# Patient Record
Sex: Male | Born: 1965 | Race: White | Hispanic: No | Marital: Single | Smoking: Current every day smoker
Health system: Southern US, Community
[De-identification: ages and names within clinical notes are randomized; demographics above are authoritative.]

## PROBLEM LIST (undated history)

## (undated) DIAGNOSIS — F1020 Alcohol dependence, uncomplicated: Secondary | ICD-10-CM

## (undated) DIAGNOSIS — I10 Essential (primary) hypertension: Secondary | ICD-10-CM

## (undated) DIAGNOSIS — J449 Chronic obstructive pulmonary disease, unspecified: Secondary | ICD-10-CM

## (undated) DIAGNOSIS — H5440 Blindness, one eye, unspecified eye: Secondary | ICD-10-CM

## (undated) DIAGNOSIS — I639 Cerebral infarction, unspecified: Secondary | ICD-10-CM

## (undated) HISTORY — PX: ANTERIOR CRUCIATE LIGAMENT REPAIR: SHX115

---

## 2000-06-15 ENCOUNTER — Ambulatory Visit (HOSPITAL_BASED_OUTPATIENT_CLINIC_OR_DEPARTMENT_OTHER): Admission: RE | Admit: 2000-06-15 | Discharge: 2000-06-15 | Payer: Self-pay | Admitting: Orthopedic Surgery

## 2000-06-23 ENCOUNTER — Ambulatory Visit (HOSPITAL_COMMUNITY): Admission: RE | Admit: 2000-06-23 | Discharge: 2000-06-23 | Payer: Self-pay | Admitting: Orthopedic Surgery

## 2000-07-08 ENCOUNTER — Encounter: Admission: RE | Admit: 2000-07-08 | Discharge: 2000-08-27 | Payer: Self-pay | Admitting: Orthopedic Surgery

## 2001-06-21 ENCOUNTER — Emergency Department (HOSPITAL_COMMUNITY): Admission: EM | Admit: 2001-06-21 | Discharge: 2001-06-21 | Payer: Self-pay | Admitting: Emergency Medicine

## 2001-08-21 ENCOUNTER — Emergency Department (HOSPITAL_COMMUNITY): Admission: EM | Admit: 2001-08-21 | Discharge: 2001-08-21 | Payer: Self-pay | Admitting: Emergency Medicine

## 2001-08-22 ENCOUNTER — Emergency Department (HOSPITAL_COMMUNITY): Admission: EM | Admit: 2001-08-22 | Discharge: 2001-08-22 | Payer: Self-pay | Admitting: Emergency Medicine

## 2001-08-23 ENCOUNTER — Emergency Department (HOSPITAL_COMMUNITY): Admission: EM | Admit: 2001-08-23 | Discharge: 2001-08-23 | Payer: Self-pay | Admitting: Emergency Medicine

## 2001-08-25 ENCOUNTER — Encounter: Admission: RE | Admit: 2001-08-25 | Discharge: 2001-08-25 | Payer: Self-pay | Admitting: Internal Medicine

## 2001-09-01 ENCOUNTER — Encounter: Admission: RE | Admit: 2001-09-01 | Discharge: 2001-09-01 | Payer: Self-pay | Admitting: Internal Medicine

## 2001-09-08 ENCOUNTER — Encounter: Admission: RE | Admit: 2001-09-08 | Discharge: 2001-09-08 | Payer: Self-pay | Admitting: Internal Medicine

## 2005-05-23 ENCOUNTER — Inpatient Hospital Stay (HOSPITAL_COMMUNITY): Admission: EM | Admit: 2005-05-23 | Discharge: 2005-05-26 | Payer: Self-pay | Admitting: Emergency Medicine

## 2005-05-23 ENCOUNTER — Ambulatory Visit: Payer: Self-pay | Admitting: Cardiology

## 2005-08-05 ENCOUNTER — Emergency Department (HOSPITAL_COMMUNITY): Admission: EM | Admit: 2005-08-05 | Discharge: 2005-08-05 | Payer: Self-pay | Admitting: Family Medicine

## 2017-11-22 ENCOUNTER — Encounter (HOSPITAL_COMMUNITY): Payer: Self-pay

## 2017-11-22 ENCOUNTER — Emergency Department (HOSPITAL_COMMUNITY)
Admission: EM | Admit: 2017-11-22 | Discharge: 2017-11-23 | Disposition: A | Discharge status: Home / Self Care | Payer: Self-pay | Attending: Emergency Medicine | Admitting: Emergency Medicine

## 2017-11-22 ENCOUNTER — Emergency Department (HOSPITAL_COMMUNITY): Payer: Self-pay

## 2017-11-22 ENCOUNTER — Other Ambulatory Visit: Payer: Self-pay

## 2017-11-22 DIAGNOSIS — Z23 Encounter for immunization: Secondary | ICD-10-CM | POA: Insufficient documentation

## 2017-11-22 DIAGNOSIS — Y998 Other external cause status: Secondary | ICD-10-CM | POA: Insufficient documentation

## 2017-11-22 DIAGNOSIS — F10920 Alcohol use, unspecified with intoxication, uncomplicated: Secondary | ICD-10-CM

## 2017-11-22 DIAGNOSIS — S0101XA Laceration without foreign body of scalp, initial encounter: Secondary | ICD-10-CM | POA: Insufficient documentation

## 2017-11-22 DIAGNOSIS — Z8673 Personal history of transient ischemic attack (TIA), and cerebral infarction without residual deficits: Secondary | ICD-10-CM | POA: Insufficient documentation

## 2017-11-22 DIAGNOSIS — Y939 Activity, unspecified: Secondary | ICD-10-CM | POA: Insufficient documentation

## 2017-11-22 DIAGNOSIS — F10120 Alcohol abuse with intoxication, uncomplicated: Secondary | ICD-10-CM | POA: Insufficient documentation

## 2017-11-22 DIAGNOSIS — S0990XA Unspecified injury of head, initial encounter: Secondary | ICD-10-CM

## 2017-11-22 DIAGNOSIS — Y33XXXA Other specified events, undetermined intent, initial encounter: Secondary | ICD-10-CM | POA: Insufficient documentation

## 2017-11-22 DIAGNOSIS — Y9289 Other specified places as the place of occurrence of the external cause: Secondary | ICD-10-CM | POA: Insufficient documentation

## 2017-11-22 DIAGNOSIS — F17200 Nicotine dependence, unspecified, uncomplicated: Secondary | ICD-10-CM | POA: Insufficient documentation

## 2017-11-22 HISTORY — DX: Cerebral infarction, unspecified: I63.9

## 2017-11-22 HISTORY — DX: Blindness, one eye, unspecified eye: H54.40

## 2017-11-22 HISTORY — DX: Alcohol dependence, uncomplicated: F10.20

## 2017-11-22 MED ORDER — TETANUS-DIPHTH-ACELL PERTUSSIS 5-2.5-18.5 LF-MCG/0.5 IM SUSP
0.5000 mL | Freq: Once | INTRAMUSCULAR | Status: AC
  Administered 2017-11-23: 0.5 mL via INTRAMUSCULAR
  Filled 2017-11-22: qty 0.5

## 2017-11-22 NOTE — ED Provider Notes (Signed)
[facility] Provider Note   CSN: [id number] Arrival date & time: 11/22/17  2244     History   Chief Complaint Chief Complaint  Patient presents with  . Fall  . Alcohol Intoxication    HPI Robert SpringMichael S Frank is a 52 y.o. male.  Patient presents to the emergency department with chief complaint of fall and intoxication.  He was reportedly found down outside of the [employer].  He has alcohol on board.  He has a laceration to the back of his head.  He cannot remember what happened.  He denies being in any pain.  He denies any history of seizure. Level 5 caveat applies 2/2 to intoxication.  The history is provided by the patient. No language interpreter was used.    Past Medical History:  Diagnosis Date  . Alcoholism (HCC)   . Blind left eye   . CVA (cerebral vascular accident) (HCC)    12 years ago- residual R sided numbness    There are no active problems to display for this patient.   Past Surgical History:  Procedure Laterality Date  . ANTERIOR CRUCIATE LIGAMENT REPAIR          Home Medications    Prior to Admission medications   Not on File    Family History History reviewed. No pertinent family history.  Social History Social History   Tobacco Use  . Smoking status: Current Every Day Smoker    Packs/day: 1.00  . Smokeless tobacco: Never Used  Substance Use Topics  . Alcohol use: Yes    Comment: 5 40 oz/day  . Drug use: Not Currently     Allergies   Patient has no known allergies.   Review of Systems Review of Systems  All other systems reviewed and are negative.    Physical Exam Updated Vital Signs BP (!) 153/104   Pulse 99   Temp 98.5 F (36.9 C)   Resp (!) 22   Ht 5\' 5"  (1.651 m)   Wt 86.2 kg (190 lb)   SpO2 96%   BMI 31.62 kg/m   Physical Exam  Constitutional: He is oriented to person, place, and time. He appears well-developed and well-nourished.  HENT:  Head: Normocephalic and  atraumatic.  3 cm linear scalp laceration  Eyes: Pupils are equal, round, and reactive to light. Conjunctivae and EOM are normal. Right eye exhibits no discharge. Left eye exhibits no discharge. No scleral icterus.  Left pupil irregular, blind in left eye at baseline   Neck: Normal range of motion. Neck supple. No JVD present.  Cardiovascular: Normal rate, regular rhythm and normal heart sounds. Exam reveals no gallop and no friction rub.  No murmur heard. Pulmonary/Chest: Effort normal. No respiratory distress. He has wheezes. He has no rales. He exhibits no tenderness.  Abdominal: Soft. He exhibits no distension and no mass. There is no tenderness. There is no rebound and no guarding.  Musculoskeletal: Normal range of motion. He exhibits no edema or tenderness.  Neurological: He is alert and oriented to person, place, and time.  intoxicated  Skin: Skin is warm and dry.  Psychiatric: He has a normal mood and affect. His behavior is normal. Judgment and thought content normal.  Nursing note and vitals reviewed.    ED Treatments / Results  Labs (all labs ordered are listed, but only abnormal results are displayed) Labs Reviewed  BASIC METABOLIC PANEL  ETHANOL  CBC  CBG MONITORING, ED    EKG  None  Radiology No results found.  Procedures Procedures (including critical care time)  Medications Ordered in ED Medications - No data to display   Initial Impression / Assessment and Plan / ED Course  I have reviewed the triage vital signs and the nursing notes.  Pertinent labs & imaging results that were available during my care of the patient were reviewed by me and considered in my medical decision making (see chart for details).     Patient is clinically intoxicated.  He has a laceration to his scalp.  Will check CT of his head and cervical spine.  Initial labs show hypokalemia of 2.7 and hypocalcemia of 4.9.  Will replace K and check albumin.  2:16 AM Corrected calcium  is 6.2-7.4.  Discussed this with Dr. Preston Fleeting, who recommends no further intervention given that patient is asymptomatic.  5:27 AM Patient is clinically sober, he is eating, drinking, and able to walk to the bathroom without any difficulties.  He is adamantly refusing repair of his scalp laceration.  He has capacity to refuse this.  Tetanus shot was updated.  Final Clinical Impressions(s) / ED Diagnoses   Final diagnoses:  Alcoholic intoxication without complication (HCC)  Injury of head, initial encounter  Laceration of scalp, initial encounter    ED Discharge Orders    None       Roxy Horseman, PA-C 11/23/17 0528    Dione Booze, MD 11/23/17 6213    Dione Booze, MD 11/23/17 2234

## 2017-11-22 NOTE — ED Notes (Signed)
Returned from xray

## 2017-11-22 NOTE — ED Triage Notes (Signed)
Pt BIB [facility] for eval of fall, ETOH intox. Pt was [place] [employer], fell backwards, struck his head. Pt does not recall incident, has been drinking beer all day today. Pt denies pain. In c-collar by EMS, but pt non compliant w/ c-collar, continually removing it and slipping chin down. Pt w/ 1 inch lac to back of head, no other obvious trauma on arrival.

## 2017-11-22 NOTE — ED Notes (Signed)
Pt taken to xray 

## 2017-11-23 LAB — HEPATIC FUNCTION PANEL
ALT: 28 U/L (ref 17–63)
AST: 40 U/L (ref 15–41)
Albumin: 1.9 g/dL — ABNORMAL LOW (ref 3.5–5.0)
Alkaline Phosphatase: 39 U/L (ref 38–126)
Bilirubin, Direct: 0.2 mg/dL (ref 0.1–0.5)
Indirect Bilirubin: 0.1 mg/dL — ABNORMAL LOW (ref 0.3–0.9)
Total Bilirubin: 0.3 mg/dL (ref 0.3–1.2)
Total Protein: 4.1 g/dL — ABNORMAL LOW (ref 6.5–8.1)

## 2017-11-23 LAB — CBC
HCT: 31.8 % — ABNORMAL LOW (ref 39.0–52.0)
Hemoglobin: 10.3 g/dL — ABNORMAL LOW (ref 13.0–17.0)
MCH: 36.8 pg — ABNORMAL HIGH (ref 26.0–34.0)
MCHC: 32.4 g/dL (ref 30.0–36.0)
MCV: 113.6 fL — ABNORMAL HIGH (ref 78.0–100.0)
Platelets: 128 10*3/uL — ABNORMAL LOW (ref 150–400)
RBC: 2.8 MIL/uL — ABNORMAL LOW (ref 4.22–5.81)
RDW: 12.8 % (ref 11.5–15.5)
WBC: 4.5 10*3/uL (ref 4.0–10.5)

## 2017-11-23 LAB — BASIC METABOLIC PANEL
Anion gap: 7 (ref 5–15)
BUN: 5 mg/dL — ABNORMAL LOW (ref 6–20)
CO2: 15 mmol/L — ABNORMAL LOW (ref 22–32)
Calcium: 4.9 mg/dL — CL (ref 8.9–10.3)
Chloride: 118 mmol/L — ABNORMAL HIGH (ref 101–111)
Creatinine, Ser: 0.42 mg/dL — ABNORMAL LOW (ref 0.61–1.24)
GFR calc Af Amer: >60 mL/min (ref >60)
GFR calc non Af Amer: >60 mL/min (ref >60)
Glucose, Bld: 70 mg/dL (ref 65–99)
Potassium: 2.7 mmol/L — CL (ref 3.5–5.1)
Sodium: 140 mmol/L (ref 135–145)

## 2017-11-23 LAB — MAGNESIUM: Magnesium: 1.2 mg/dL — ABNORMAL LOW (ref 1.7–2.4)

## 2017-11-23 LAB — ETHANOL: Alcohol, Ethyl (B): 178 mg/dL — ABNORMAL HIGH (ref <10)

## 2017-11-23 MED ORDER — POTASSIUM CHLORIDE CRYS ER 20 MEQ PO TBCR: 20.0000 meq | EXTENDED_RELEASE_TABLET | Freq: Two times a day (BID) | ORAL | 0 refills | Status: DC

## 2017-11-23 MED ORDER — POTASSIUM CHLORIDE CRYS ER 20 MEQ PO TBCR
40.0000 meq | EXTENDED_RELEASE_TABLET | Freq: Once | ORAL | Status: AC
  Administered 2017-11-23: 40 meq via ORAL
  Filled 2017-11-23: qty 2

## 2018-10-06 ENCOUNTER — Observation Stay (HOSPITAL_COMMUNITY)
Admission: EM | Admit: 2018-10-06 | Discharge: 2018-10-07 | Disposition: A | Discharge status: Home Health | Payer: Self-pay | Attending: Internal Medicine | Admitting: Internal Medicine

## 2018-10-06 ENCOUNTER — Emergency Department (HOSPITAL_COMMUNITY): Payer: Self-pay

## 2018-10-06 ENCOUNTER — Encounter (HOSPITAL_COMMUNITY): Payer: Self-pay

## 2018-10-06 ENCOUNTER — Other Ambulatory Visit: Payer: Self-pay

## 2018-10-06 DIAGNOSIS — R0600 Dyspnea, unspecified: Secondary | ICD-10-CM

## 2018-10-06 DIAGNOSIS — I10 Essential (primary) hypertension: Secondary | ICD-10-CM | POA: Insufficient documentation

## 2018-10-06 DIAGNOSIS — H5462 Unqualified visual loss, left eye, normal vision right eye: Secondary | ICD-10-CM | POA: Insufficient documentation

## 2018-10-06 DIAGNOSIS — D7589 Other specified diseases of blood and blood-forming organs: Secondary | ICD-10-CM | POA: Insufficient documentation

## 2018-10-06 DIAGNOSIS — J449 Chronic obstructive pulmonary disease, unspecified: Secondary | ICD-10-CM | POA: Diagnosis present

## 2018-10-06 DIAGNOSIS — F17210 Nicotine dependence, cigarettes, uncomplicated: Secondary | ICD-10-CM

## 2018-10-06 DIAGNOSIS — Z8673 Personal history of transient ischemic attack (TIA), and cerebral infarction without residual deficits: Secondary | ICD-10-CM | POA: Insufficient documentation

## 2018-10-06 DIAGNOSIS — J441 Chronic obstructive pulmonary disease with (acute) exacerbation: Principal | ICD-10-CM | POA: Diagnosis present

## 2018-10-06 DIAGNOSIS — Z20828 Contact with and (suspected) exposure to other viral communicable diseases: Secondary | ICD-10-CM | POA: Insufficient documentation

## 2018-10-06 DIAGNOSIS — R238 Other skin changes: Secondary | ICD-10-CM

## 2018-10-06 DIAGNOSIS — I872 Venous insufficiency (chronic) (peripheral): Secondary | ICD-10-CM

## 2018-10-06 HISTORY — DX: Chronic obstructive pulmonary disease with (acute) exacerbation: J44.1

## 2018-10-06 LAB — CBC WITH DIFFERENTIAL/PLATELET
Abs Immature Granulocytes: 0.04 10*3/uL (ref 0.00–0.07)
Basophils Absolute: 0.1 10*3/uL (ref 0.0–0.1)
Basophils Relative: 1 %
Eosinophils Absolute: 0.6 10*3/uL — ABNORMAL HIGH (ref 0.0–0.5)
Eosinophils Relative: 7 %
HCT: 48.5 % (ref 39.0–52.0)
Hemoglobin: 16.2 g/dL (ref 13.0–17.0)
Immature Granulocytes: 1 %
Lymphocytes Relative: 15 %
Lymphs Abs: 1.3 10*3/uL (ref 0.7–4.0)
MCH: 36.8 pg — ABNORMAL HIGH (ref 26.0–34.0)
MCHC: 33.4 g/dL (ref 30.0–36.0)
MCV: 110.2 fL — ABNORMAL HIGH (ref 80.0–100.0)
Monocytes Absolute: 1.2 10*3/uL — ABNORMAL HIGH (ref 0.1–1.0)
Monocytes Relative: 14 %
Neutro Abs: 5.3 10*3/uL (ref 1.7–7.7)
Neutrophils Relative %: 62 %
Platelets: 243 10*3/uL (ref 150–400)
RBC: 4.4 MIL/uL (ref 4.22–5.81)
RDW: 12.3 % (ref 11.5–15.5)
WBC: 8.5 10*3/uL (ref 4.0–10.5)
nRBC: 0 % (ref 0.0–0.2)

## 2018-10-06 LAB — COMPREHENSIVE METABOLIC PANEL
ALT: 28 U/L (ref 0–44)
AST: 32 U/L (ref 15–41)
Albumin: 3.5 g/dL (ref 3.5–5.0)
Alkaline Phosphatase: 85 U/L (ref 38–126)
Anion gap: 11 (ref 5–15)
BUN: 9 mg/dL (ref 6–20)
CO2: 31 mmol/L (ref 22–32)
Calcium: 9.1 mg/dL (ref 8.9–10.3)
Chloride: 95 mmol/L — ABNORMAL LOW (ref 98–111)
Creatinine, Ser: 0.79 mg/dL (ref 0.61–1.24)
GFR calc Af Amer: >60 mL/min (ref >60)
GFR calc non Af Amer: >60 mL/min (ref >60)
Glucose, Bld: 124 mg/dL — ABNORMAL HIGH (ref 70–99)
Potassium: 4 mmol/L (ref 3.5–5.1)
Sodium: 137 mmol/L (ref 135–145)
Total Bilirubin: 0.7 mg/dL (ref 0.3–1.2)
Total Protein: 7.6 g/dL (ref 6.5–8.1)

## 2018-10-06 LAB — LACTATE DEHYDROGENASE: LDH: 152 U/L (ref 98–192)

## 2018-10-06 LAB — LACTIC ACID, PLASMA: Lactic Acid, Venous: 1.3 mmol/L (ref 0.5–1.9)

## 2018-10-06 LAB — D-DIMER, QUANTITATIVE: D-Dimer, Quant: 1.59 ug/mL-FEU — ABNORMAL HIGH (ref 0.00–0.50)

## 2018-10-06 LAB — PROCALCITONIN: Procalcitonin: <0.1 ng/mL

## 2018-10-06 LAB — C-REACTIVE PROTEIN: CRP: <0.8 mg/dL (ref <1.0)

## 2018-10-06 LAB — BRAIN NATRIURETIC PEPTIDE: B Natriuretic Peptide: 155.8 pg/mL — ABNORMAL HIGH (ref 0.0–100.0)

## 2018-10-06 LAB — FERRITIN: Ferritin: 536 ng/mL — ABNORMAL HIGH (ref 24–336)

## 2018-10-06 LAB — TRIGLYCERIDES: Triglycerides: 40 mg/dL (ref <150)

## 2018-10-06 LAB — VITAMIN B12: Vitamin B-12: 165 pg/mL — ABNORMAL LOW (ref 180–914)

## 2018-10-06 LAB — FIBRINOGEN: Fibrinogen: 429 mg/dL (ref 210–475)

## 2018-10-06 LAB — SARS CORONAVIRUS 2 BY RT PCR (HOSPITAL ORDER, PERFORMED IN ~~LOC~~ HOSPITAL LAB): SARS Coronavirus 2: NEGATIVE

## 2018-10-06 MED ORDER — ALBUTEROL SULFATE HFA 108 (90 BASE) MCG/ACT IN AERS
8.0000 | INHALATION_SPRAY | Freq: Once | RESPIRATORY_TRACT | Status: AC
  Administered 2018-10-06: 06:00:00 8 via RESPIRATORY_TRACT

## 2018-10-06 MED ORDER — FOLIC ACID 1 MG PO TABS
1.0000 mg | ORAL_TABLET | Freq: Every day | ORAL | Status: DC
  Administered 2018-10-07: 1 mg via ORAL
  Filled 2018-10-06: qty 1

## 2018-10-06 MED ORDER — LORAZEPAM 2 MG/ML IJ SOLN: 2.0000 mg | INTRAMUSCULAR | Status: DC | PRN

## 2018-10-06 MED ORDER — SODIUM CHLORIDE 0.9 % IV SOLN
500.0000 mg | INTRAVENOUS | Status: AC
  Administered 2018-10-06: 08:00:00 500 mg via INTRAVENOUS
  Filled 2018-10-06: qty 500

## 2018-10-06 MED ORDER — VITAMIN B-1 100 MG PO TABS
100.0000 mg | ORAL_TABLET | Freq: Every day | ORAL | Status: DC
  Administered 2018-10-07: 100 mg via ORAL
  Filled 2018-10-06: qty 1

## 2018-10-06 MED ORDER — THIAMINE HCL 100 MG/ML IJ SOLN
100.0000 mg | Freq: Every day | INTRAMUSCULAR | Status: DC
  Administered 2018-10-06: 100 mg via INTRAVENOUS
  Filled 2018-10-06: qty 2

## 2018-10-06 MED ORDER — ALBUTEROL SULFATE HFA 108 (90 BASE) MCG/ACT IN AERS
2.0000 | INHALATION_SPRAY | Freq: Once | RESPIRATORY_TRACT | Status: AC
  Administered 2018-10-06: 2 via RESPIRATORY_TRACT
  Filled 2018-10-06: qty 6.7

## 2018-10-06 MED ORDER — AEROCHAMBER PLUS FLO-VU LARGE MISC
1.0000 | Freq: Once | Status: AC
  Administered 2018-10-06: 1

## 2018-10-06 MED ORDER — IPRATROPIUM-ALBUTEROL 0.5-2.5 (3) MG/3ML IN SOLN: 3.0000 mL | Freq: Four times a day (QID) | RESPIRATORY_TRACT | Status: DC | PRN

## 2018-10-06 MED ORDER — IPRATROPIUM-ALBUTEROL 0.5-2.5 (3) MG/3ML IN SOLN
3.0000 mL | Freq: Four times a day (QID) | RESPIRATORY_TRACT | Status: DC
  Administered 2018-10-06 (×2): 3 mL via RESPIRATORY_TRACT
  Filled 2018-10-06 (×2): qty 3

## 2018-10-06 MED ORDER — PREDNISONE 20 MG PO TABS
40.0000 mg | ORAL_TABLET | Freq: Every day | ORAL | Status: DC
  Administered 2018-10-07: 40 mg via ORAL
  Filled 2018-10-06: qty 2

## 2018-10-06 MED ORDER — AZITHROMYCIN 250 MG PO TABS
500.0000 mg | ORAL_TABLET | Freq: Every day | ORAL | Status: DC
  Administered 2018-10-07: 500 mg via ORAL
  Filled 2018-10-06: qty 2

## 2018-10-06 MED ORDER — ENOXAPARIN SODIUM 40 MG/0.4ML ~~LOC~~ SOLN
40.0000 mg | Freq: Every day | SUBCUTANEOUS | Status: DC
  Administered 2018-10-06 – 2018-10-07 (×2): 40 mg via SUBCUTANEOUS
  Filled 2018-10-06 (×2): qty 0.4

## 2018-10-06 MED ORDER — FOLIC ACID 5 MG/ML IJ SOLN
1.0000 mg | Freq: Every day | INTRAMUSCULAR | Status: DC
  Administered 2018-10-06: 1 mg via INTRAVENOUS
  Filled 2018-10-06 (×2): qty 0.2

## 2018-10-06 MED ORDER — METHYLPREDNISOLONE SODIUM SUCC 125 MG IJ SOLR
125.0000 mg | Freq: Once | INTRAMUSCULAR | Status: AC
  Administered 2018-10-06: 05:00:00 125 mg via INTRAVENOUS
  Filled 2018-10-06: qty 2

## 2018-10-06 MED ORDER — AEROCHAMBER PLUS FLO-VU LARGE MISC
Status: AC
  Filled 2018-10-06: qty 1

## 2018-10-06 MED ORDER — NICOTINE 14 MG/24HR TD PT24
14.0000 mg | MEDICATED_PATCH | Freq: Every day | TRANSDERMAL | Status: DC
  Administered 2018-10-07: 14 mg via TRANSDERMAL
  Filled 2018-10-06 (×2): qty 1

## 2018-10-06 NOTE — ED Notes (Signed)
Pt ambulated on room air while maintaining oxygen saturation of 97-100%.

## 2018-10-06 NOTE — ED Notes (Signed)
ED TO INPATIENT HANDOFF REPORT  ED Nurse Name and Phone #:  Steward Drone RN [phone]  S Name/Age/Gender Robert Frank 53 y.o. male Room/Bed: 030C/030C  Code Status   Code Status: Full Code  Home/SNF/Other Home Patient oriented to: self, place, time and situation Is this baseline? Yes   Triage Complete: Triage complete  Chief Complaint sob  Triage Note Pt reports cough x1 week. Cough is productive of white-yellow phlegm. Pt reports increased shortness of breath over the past three nights. Pt denies any significant medical hx. Denies fever, chest pain, nausea, vomiting. Pt denies sick contacts.    Allergies No Known Allergies  Level of Care/Admitting Diagnosis ED Disposition    ED Disposition Condition Comment   Admit  Hospital Area: [facility] [[id number]]  Level of Care: Progressive [102]  Covid Evaluation: N/A  Diagnosis: COPD with acute exacerbation [facility]) [604540]  Admitting Physician: Nena Polio  Attending Physician: Inez Catalina [phone]  Estimated length of stay: past midnight tomorrow  Certification:: I certify this patient will need inpatient services for at least 2 midnights  PT Class (Do Not Modify): Inpatient [101]  PT Acc Code (Do Not Modify): Private [1]       B Medical/Surgery History Past Medical History:  Diagnosis Date  . Alcoholism (HCC)   . Blind left eye   . CVA (cerebral vascular accident) (HCC)    12 years ago- residual R sided numbness   Past Surgical History:  Procedure Laterality Date  . ANTERIOR CRUCIATE LIGAMENT REPAIR       A IV Location/Drains/Wounds Patient Lines/Drains/Airways Status   Active Line/Drains/Airways    Name:   Placement date:   Placement time:   Site:   Days:   Peripheral IV 10/06/18 Right Antecubital   10/06/18    0518    Antecubital   less than 1          Intake/Output Last 24 hours No intake or output data in the 24 hours ending 10/06/18 9147  Labs/Imaging Results for orders  placed or performed during the hospital encounter of 10/06/18 (from the past 48 hour(s))  SARS Coronavirus 2 [facility] order, Performed in [facility] hospital lab)     Status: None   Collection Time: 10/06/18  4:11 AM  Result Value Ref Range   SARS Coronavirus 2 NEGATIVE NEGATIVE    Comment: (NOTE) If result is NEGATIVE SARS-CoV-2 target nucleic acids are NOT DETECTED. The SARS-CoV-2 RNA is generally detectable in upper and lower  respiratory specimens during the acute phase of infection. The lowest  concentration of SARS-CoV-2 viral copies this assay can detect is 250  copies / mL. A negative result does not preclude SARS-CoV-2 infection  and should not be used as the sole basis for treatment or other  patient management decisions.  A negative result may occur with  improper specimen collection / handling, submission of specimen other  than nasopharyngeal swab, presence of viral mutation(s) within the  areas targeted by this assay, and inadequate number of viral copies  (<250 copies / mL). A negative result must be combined with clinical  observations, patient history, and epidemiological information. If result is POSITIVE SARS-CoV-2 target nucleic acids are DETECTED. The SARS-CoV-2 RNA is generally detectable in upper and lower  respiratory specimens dur ing the acute phase of infection.  Positive  results are indicative of active infection with SARS-CoV-2.  Clinical  correlation with patient history and other diagnostic information is  necessary to determine patient  infection status.  Positive results do  not rule out bacterial infection or co-infection with other viruses. If result is PRESUMPTIVE POSTIVE SARS-CoV-2 nucleic acids MAY BE PRESENT.   A presumptive positive result was obtained on the submitted specimen  and confirmed on repeat testing.  While 2019 novel coronavirus  (SARS-CoV-2) nucleic acids may be present in the submitted sample  additional confirmatory testing may  be necessary for epidemiological  and / or clinical management purposes  to differentiate between  SARS-CoV-2 and other Sarbecovirus currently known to infect humans.  If clinically indicated additional testing with an alternate test  methodology [phone]) is advised. The SARS-CoV-2 RNA is generally  detectable in upper and lower respiratory sp ecimens during the acute  phase of infection. The expected result is Negative. Fact Sheet for Patients:  BoilerBrush.com.cy Fact Sheet for Healthcare Providers: https://pope.com/ This test is not yet approved or cleared by the [place] FDA and has been authorized for detection and/or diagnosis of SARS-CoV-2 by FDA under an Emergency Use Authorization (EUA).  This EUA will remain in effect (meaning this test can be used) for the duration of the COVID-19 declaration under Section 564(b)(1) of the Act, 21 U.S.C. section 360bbb-3(b)(1), unless the authorization is terminated or revoked sooner. Performed at [facility], [address], [place], [place] [address]   Lactic acid, plasma     Status: None   Collection Time: 10/06/18  4:14 AM  Result Value Ref Range   Lactic Acid, Venous 1.3 0.5 - 1.9 mmol/L    Comment: Performed at [facility], [address] [place], [place] [address]  CBC WITH DIFFERENTIAL     Status: Abnormal   Collection Time: 10/06/18  4:15 AM  Result Value Ref Range   WBC 8.5 4.0 - 10.5 K/uL   RBC 4.40 4.22 - 5.81 MIL/uL   Hemoglobin 16.2 13.0 - 17.0 g/dL   HCT 47.8 29.5 - 62.1 %   MCV 110.2 (H) 80.0 - 100.0 fL   MCH 36.8 (H) 26.0 - 34.0 pg   MCHC 33.4 30.0 - 36.0 g/dL   RDW 30.8 65.7 - 84.6 %   Platelets 243 150 - 400 K/uL   nRBC 0.0 0.0 - 0.2 %   Neutrophils Relative % 62 %   Neutro Abs 5.3 1.7 - 7.7 K/uL   Lymphocytes Relative 15 %   Lymphs Abs 1.3 0.7 - 4.0 K/uL   Monocytes Relative 14 %   Monocytes Absolute 1.2 (H) 0.1 - 1.0 K/uL   Eosinophils  Relative 7 %   Eosinophils Absolute 0.6 (H) 0.0 - 0.5 K/uL   Basophils Relative 1 %   Basophils Absolute 0.1 0.0 - 0.1 K/uL   Immature Granulocytes 1 %   Abs Immature Granulocytes 0.04 0.00 - 0.07 K/uL    Comment: Performed at [facility], [address], [place], [place] [address]  Comprehensive metabolic panel     Status: Abnormal   Collection Time: 10/06/18  4:15 AM  Result Value Ref Range   Sodium 137 135 - 145 mmol/L   Potassium 4.0 3.5 - 5.1 mmol/L   Chloride 95 (L) 98 - 111 mmol/L   CO2 31 22 - 32 mmol/L   Glucose, Bld 124 (H) 70 - 99 mg/dL   BUN 9 6 - 20 mg/dL   Creatinine, Ser 2.84 0.61 - 1.24 mg/dL   Calcium 9.1 8.9 - 13.2 mg/dL   Total Protein 7.6 6.5 - 8.1 g/dL   Albumin 3.5  3.5 - 5.0 g/dL   AST 32 15 - 41 U/L   ALT 28 0 - 44 U/L   Alkaline Phosphatase 85 38 - 126 U/L   Total Bilirubin 0.7 0.3 - 1.2 mg/dL   GFR calc non Af Amer >60 >60 mL/min   GFR calc Af Amer >60 >60 mL/min   Anion gap 11 5 - 15    Comment: Performed at [facility], [address], [place], [place] [address]  D-dimer, quantitative     Status: Abnormal   Collection Time: 10/06/18  4:15 AM  Result Value Ref Range   D-Dimer, Quant 1.59 (H) 0.00 - 0.50 ug/mL-FEU    Comment: (NOTE) At the manufacturer cut-off of 0.50 ug/mL FEU, this assay has been documented to exclude PE with a sensitivity and negative predictive value of 97 to 99%.  At this time, this assay has not been approved by the FDA to exclude DVT/VTE. Results should be correlated with clinical presentation. Performed at [facility], [address] [place], [place] [address]   Procalcitonin     Status: None   Collection Time: 10/06/18  4:15 AM  Result Value Ref Range   Procalcitonin <0.10 ng/mL    Comment:        Interpretation: PCT (Procalcitonin) <= 0.5 ng/mL: Systemic infection (sepsis) is not likely. Local bacterial infection is possible. (NOTE)       Sepsis PCT Algorithm           Lower Respiratory  Tract                                      Infection PCT Algorithm    ----------------------------     ----------------------------         PCT < 0.25 ng/mL                PCT < 0.10 ng/mL         Strongly encourage             Strongly discourage   discontinuation of antibiotics    initiation of antibiotics    ----------------------------     -----------------------------       PCT 0.25 - 0.50 ng/mL            PCT 0.10 - 0.25 ng/mL               OR       >80% decrease in PCT            Discourage initiation of                                            antibiotics      Encourage discontinuation           of antibiotics    ----------------------------     -----------------------------         PCT >= 0.50 ng/mL              PCT 0.26 - 0.50 ng/mL               AND        <80% decrease in PCT             Encourage initiation of  antibiotics       Encourage continuation           of antibiotics    ----------------------------     -----------------------------        PCT >= 0.50 ng/mL                  PCT > 0.50 ng/mL               AND         increase in PCT                  Strongly encourage                                      initiation of antibiotics    Strongly encourage escalation           of antibiotics                                     -----------------------------                                           PCT <= 0.25 ng/mL                                                 OR                                        > 80% decrease in PCT                                     Discontinue / Do not initiate                                             antibiotics Performed at [facility], [address] [place], [place] [address]   Lactate dehydrogenase     Status: None   Collection Time: 10/06/18  4:15 AM  Result Value Ref Range   LDH 152 98 - 192 U/L    Comment: Performed at [facility], [address],  [place], [place] [address]  Ferritin     Status: Abnormal   Collection Time: 10/06/18  4:15 AM  Result Value Ref Range   Ferritin 536 (H) 24 - 336 ng/mL    Comment: Performed at [facility], [address] [place], [place] [address]  Triglycerides     Status: None   Collection Time: 10/06/18  4:15 AM  Result Value Ref Range   Triglycerides 40 <150 mg/dL    Comment: Performed at [facility], [address] [place], [place] [address]  Fibrinogen     Status: None   Collection Time: 10/06/18  4:15 AM  Result Value Ref  Range   Fibrinogen 429 210 - 475 mg/dL    Comment: Performed at [facility], [address] [place], [place] [address]  C-reactive protein     Status: None   Collection Time: 10/06/18  4:15 AM  Result Value Ref Range   CRP <0.8 <1.0 mg/dL    Comment: Performed at [facility], [address] [place], [place] [address]  Brain natriuretic peptide     Status: Abnormal   Collection Time: 10/06/18  4:15 AM  Result Value Ref Range   B Natriuretic Peptide 155.8 (H) 0.0 - 100.0 pg/mL    Comment: Performed at [facility], [address] [place], [place] [address]   Dg Chest Portable 1 View  Result Date: 10/06/2018 CLINICAL DATA:  Cough for 1 week. EXAM: PORTABLE CHEST 1 VIEW COMPARISON:  Two-view chest x-ray 11/22/2017 FINDINGS: The heart size is normal. There is no edema or effusion. No focal airspace disease is present. Atherosclerotic changes are noted at the aortic arch. IMPRESSION: 1. No acute cardiopulmonary disease. 2. Aortic atherosclerosis. Electronically Signed   By: Marin Roberts M.D.   On: 10/06/2018 04:31    Pending Labs Unresulted Labs (From admission, onward)    Start     Ordered   10/07/18 0500  HIV antibody (Routine Testing)  Tomorrow morning,   R     10/06/18 0724   10/06/18 0816  Vitamin B12  Add-on,   R     10/06/18 0815   10/06/18 0816  Folate RBC  Add-on,   R     10/06/18 0815   10/06/18 0721  HIV antibody  Once,    R     10/06/18 0724   10/06/18 0356  Blood Culture (routine x 2)  BLOOD CULTURE X 2,   STAT    Question:  Patient immune status  Answer:  Normal   10/06/18 0356          Vitals/Pain Today's Vitals   10/06/18 0615 10/06/18 0624 10/06/18 0630 10/06/18 0737  BP:  122/88  136/81  Pulse: 99 (!) 119 (!) 103 (!) 105  Resp: 14 (!) 31 (!) 24   Temp:      TempSrc:      SpO2: 98% 99% 98%   Weight:      Height:      PainSc:        Isolation Precautions No active isolations  Medications Medications  enoxaparin (LOVENOX) injection 40 mg (has no administration in time range)  azithromycin (ZITHROMAX) 500 mg in sodium chloride 0.9 % 250 mL IVPB (500 mg Intravenous New Bag/Given 10/06/18 0807)    Followed by  azithromycin (ZITHROMAX) tablet 500 mg (has no administration in time range)  predniSONE (DELTASONE) tablet 40 mg (has no administration in time range)  ipratropium-albuterol (DUONEB) 0.5-2.5 (3) MG/3ML nebulizer solution 3 mL (3 mLs Nebulization Given 10/06/18 0807)  LORazepam (ATIVAN) injection 2-3 mg (has no administration in time range)  folic acid injection 1 mg (has no administration in time range)  thiamine (B-1) injection 100 mg (has no administration in time range)  albuterol (VENTOLIN HFA) 108 (90 Base) MCG/ACT inhaler 2 puff (2 puffs Inhalation Given 10/06/18 0413)  AeroChamber Plus Flo-Vu Large MISC 1 each (1 each Other Given 10/06/18 0413)  methylPREDNISolone sodium succinate (SOLU-MEDROL) 125 mg/2 mL injection 125 mg (125 mg Intravenous Given 10/06/18 0523)  albuterol (VENTOLIN HFA) 108 (90 Base) MCG/ACT inhaler 8 puff (8 puffs Inhalation Given 10/06/18 0622)  Mobility walks Low fall risk   Focused Assessments Pulmonary Assessment Handoff:  Lung sounds: Bilateral Breath Sounds: Diminished, Inspiratory wheezes, Expiratory wheezes L Breath Sounds: Expiratory wheezes, Inspiratory wheezes R Breath Sounds: Expiratory wheezes, Inspiratory wheezes O2 Device: Room Air O2  Flow Rate (L/min): 2 L/min      R Recommendations: See Admitting Provider Note  Report given to:   Additional Notes:  CIWA performed b/c pt was shaking upon change of shift.  States drinks 16 - 40 oz beer per day.  CIWA 4.

## 2018-10-06 NOTE — Progress Notes (Signed)
RT instructed patient on the use of incentive spirometer, MDI inhalers, and nebulizer treatments.  Patient able to reach 1750 mL using the incentive spirometer.

## 2018-10-06 NOTE — Progress Notes (Addendum)
Nutrition Consult/Brief Note  RD working remotely  RD consulted via Inpatient COPD Exacerbation Protocol.  Wt Readings from Last 15 Encounters:  10/06/18 96.5 kg  11/22/17 86.2 kg   Body mass index is 33.33 kg/m. Patient meets criteria for Obesity Class I based on current BMI.   Current diet order is Regular. Pt reports a good appetite. Labs and medications reviewed.   No nutrition interventions warranted at this time.   If nutrition issues arise, please consult RD.   Maureen Chatters, RD, LDN Pager #: [phone] After-Hours Pager #: [phone]

## 2018-10-06 NOTE — ED Triage Notes (Signed)
Pt reports cough x1 week. Cough is productive of white-yellow phlegm. Pt reports increased shortness of breath over the past three nights. Pt denies any significant medical hx. Denies fever, chest pain, nausea, vomiting. Pt denies sick contacts.

## 2018-10-06 NOTE — ED Provider Notes (Signed)
[facility] Provider Note   CSN: [id number] Arrival date & time: 10/06/18  [id number]    History   Chief Complaint Chief Complaint  Patient presents with  . Shortness of Breath  . Cough   Level 5 caveat due to acuity of condition HPI Robert Frank is a 53 y.o. male.     The history is provided by the patient. The history is limited by the condition of the patient.  Shortness of Breath  Severity:  Severe Onset quality:  Gradual Duration:  1 week Timing:  Intermittent Progression:  Worsening Chronicity:  New Relieved by:  Nothing Worsened by:  Nothing Associated symptoms: cough   Associated symptoms: no chest pain, no fever and no hemoptysis   Cough  Associated symptoms: shortness of breath   Associated symptoms: no chest pain and no fever   With history of alcohol abuse, previous CVA presents with cough.  He reports has had cough for up to 1 week.  No hemoptysis, has been producing yellow phlegm.  No fever.  No known COVID-19 exposures but he has been out interacting with others. He feels that his shortness of breath abruptly worsened tonight  Past Medical History:  Diagnosis Date  . Alcoholism (HCC)   . Blind left eye   . CVA (cerebral vascular accident) (HCC)    12 years ago- residual R sided numbness    There are no active problems to display for this patient.   Past Surgical History:  Procedure Laterality Date  . ANTERIOR CRUCIATE LIGAMENT REPAIR          Home Medications    Prior to Admission medications   Medication Sig Start Date End Date Taking? Authorizing Provider  potassium chloride SA (K-DUR,KLOR-CON) 20 MEQ tablet Take 1 tablet (20 mEq total) by mouth 2 (two) times daily. 11/23/17   Roxy HorsemanBrowning, Robert, PA-C    Family History No family history on file.  Social History Social History   Tobacco Use  . Smoking status: Current Every Day Smoker    Packs/day: 1.00  . Smokeless tobacco: Never Used   Substance Use Topics  . Alcohol use: Yes    Comment: 5 40 oz/day  . Drug use: Not Currently     Allergies   Patient has no known allergies.   Review of Systems Review of Systems  Unable to perform ROS: Acuity of condition  Constitutional: Negative for fever.  Respiratory: Positive for cough and shortness of breath. Negative for hemoptysis.   Cardiovascular: Negative for chest pain.     Physical Exam Updated Vital Signs BP (!) 179/112 (BP Location: Right Arm)   Pulse (!) 108   Temp 98.2 F (36.8 C) (Oral)   Ht 1.702 m (5\' 7" )   Wt 86.2 kg   SpO2 100%   BMI 29.76 kg/m   Physical Exam CONSTITUTIONAL: ill appearing, respiratory distress noted HEAD: Normocephalic/atraumatic EYES: EOMI/PERRL ENMT: Mucous membranes moist, NRB mask in place NECK: supple no meningeal signs SPINE/BACK:entire spine nontender CV: tachycardic LUNGS: tachypnea noted, distress noted, wheezing bilaterally, crackles in the base ABDOMEN: soft, nontender, no rebound or guarding, bowel sounds noted throughout abdomen GU:no cva tenderness NEURO: Pt is awake/alert/appropriate, moves all extremitiesx4.  No facial droop.   EXTREMITIES: pulses normal/equal, full ROM no lower extremity edema SKIN: warm, color normal PSYCH: Anxious  ED Treatments / Results  Labs (all labs ordered are listed, but only abnormal results are displayed) Labs Reviewed  CBC WITH DIFFERENTIAL/PLATELET - Abnormal; Notable for  the following components:      Result Value   MCV 110.2 (*)    MCH 36.8 (*)    Monocytes Absolute 1.2 (*)    Eosinophils Absolute 0.6 (*)    All other components within normal limits  COMPREHENSIVE METABOLIC PANEL - Abnormal; Notable for the following components:   Chloride 95 (*)    Glucose, Bld 124 (*)    All other components within normal limits  D-DIMER, QUANTITATIVE (NOT AT [facility]) - Abnormal; Notable for the following components:   D-Dimer, Quant 1.59 (*)    All other components within normal  limits  FERRITIN - Abnormal; Notable for the following components:   Ferritin 536 (*)    All other components within normal limits  BRAIN NATRIURETIC PEPTIDE - Abnormal; Notable for the following components:   B Natriuretic Peptide 155.8 (*)    All other components within normal limits  SARS CORONAVIRUS 2 (HOSPITAL ORDER, PERFORMED IN [place] [facility])  CULTURE, BLOOD (ROUTINE X 2)  CULTURE, BLOOD (ROUTINE X 2)  LACTIC ACID, PLASMA  PROCALCITONIN  LACTATE DEHYDROGENASE  TRIGLYCERIDES  FIBRINOGEN  C-REACTIVE PROTEIN    EKG EKG Interpretation  Date/Time:  Wednesday October 06 2018 03:46:35 EDT Ventricular Rate:  111 PR Interval:    QRS Duration: 172 QT Interval:  352 QTC Calculation: 481 R Axis:   15 Text Interpretation:  Sinus tachycardia Right atrial enlargement Nonspecific intraventricular conduction delay Abnormal ekg Interpretation limited secondary to artifact Confirmed by Zadie Rhine ([id number]) on 10/06/2018 4:04:06 AM   Radiology Dg Chest Portable 1 View  Result Date: 10/06/2018 CLINICAL DATA:  Cough for 1 week. EXAM: PORTABLE CHEST 1 VIEW COMPARISON:  Two-view chest x-ray 11/22/2017 FINDINGS: The heart size is normal. There is no edema or effusion. No focal airspace disease is present. Atherosclerotic changes are noted at the aortic arch. IMPRESSION: 1. No acute cardiopulmonary disease. 2. Aortic atherosclerosis. Electronically Signed   By: Marin Roberts M.D.   On: 10/06/2018 04:31    Procedures Procedures  CRITICAL CARE Performed by: Joya Gaskins Total critical care time: 35 minutes Critical care time was exclusive of separately billable procedures and treating other patients. Critical care was necessary to treat or prevent imminent or life-threatening deterioration. Critical care was time spent personally by me on the following activities: development of treatment plan with patient and/or surrogate as well as nursing, discussions with  consultants, evaluation of patient's response to treatment, examination of patient, obtaining history from patient or surrogate, ordering and performing treatments and interventions, ordering and review of laboratory studies, ordering and review of radiographic studies, pulse oximetry and re-evaluation of patient's condition.   Medications Ordered in ED Medications  albuterol (VENTOLIN HFA) 108 (90 Base) MCG/ACT inhaler 2 puff (2 puffs Inhalation Given 10/06/18 0413)  AeroChamber Plus Flo-Vu Large MISC 1 each (1 each Other Given 10/06/18 0413)  methylPREDNISolone sodium succinate (SOLU-MEDROL) 125 mg/2 mL injection 125 mg (125 mg Intravenous Given 10/06/18 0523)  albuterol (VENTOLIN HFA) 108 (90 Base) MCG/ACT inhaler 8 puff (8 puffs Inhalation Given 10/06/18 0622)     Initial Impression / Assessment and Plan / ED Course  I have reviewed the triage vital signs and the nursing notes.  Pertinent labs & imaging results that were available during my care of the patient were reviewed by me and considered in my medical decision making (see chart for details).        4:07 AM Patient presents in respiratory distress.  He is tachypneic and wheezing.  He is currently afebrile. Imaging and labs are pending at this time.  He will be given an albuterol MDI with spacer Will Follow closely 5:07 AM Pt appears improved, labs pending 6:54 AM Overall patient is improved, but he still is not back to baseline, he is still wheezing His work of breathing was increased after walking.  I do not feel he is back to baseline.  I feel he would benefit from admission He was given further albuterol. Suspect patient has untreated COPD He did have elevated d-dimer during the course of work-up for COVID-19, however suspicion for PE is low He does admit to frequent alcohol use, he would need to be on CIWA protocol  Discussed case with internal medicine resident for admission  Robert Frank was evaluated in  Emergency Department on 10/06/2018 for the symptoms described in the history of present illness. He was evaluated in the context of the global COVID-19 pandemic, which necessitated consideration that the patient might be at risk for infection with the SARS-CoV-2 virus that causes COVID-19. Institutional protocols and algorithms that pertain to the evaluation of patients at risk for COVID-19 are in a state of rapid change based on information released by regulatory bodies including the CDC and federal and state organizations. These policies and algorithms were followed during the patient's care in the ED.  Final Clinical Impressions(s) / ED Diagnoses   Final diagnoses:  COPD exacerbation [facility])    ED Discharge Orders    None       Zadie Rhine, MD 10/06/18 [phone]

## 2018-10-06 NOTE — ED Notes (Signed)
Pt given coke per request.  Does not want food at this time.

## 2018-10-06 NOTE — H&P (Signed)
Date: 10/06/2018               Patient Name:  Robert SpringMichael S Frank MRN: [id number]  DOB: 13-Apr-1966 Age / Sex: 53 y.o., male   PCP: Patient, No Pcp Per         Medical Service: Internal Medicine Teaching Service         Attending Physician: Dr. Inez CatalinaMullen, Emily B, MD    First Contact: Dr. Lenward ChancellorBloomfield, Carley Pager: [phone]  Second Contact: Dr. Lanelle BalHarbrecht, Lawrence Pager: [phone]       After Hours (After 5p/  First Contact Pager: [phone]  weekends / holidays): Second Contact Pager: [phone]   Chief Complaint: dyspnea  History of Present Illness: 53 y.o. yo male w/ PMH significant for Alcohol use disorder, CVA.  Presents with new onset dyspnea.  Started 3-4 days ago.  Noticed dyspnea on exertion that worsened and he started wheezing.  Wheezing is not new but worse than usual.  He has never been this short of breath. No inhalers or medicines at home. No fevers chills or infectious symptoms other than cough which was worsened from baseline cough. It is productive of white/yellow sputum. Feels congested.  No seasonal allergies.  No sick contacts.  No N/V/Diarrhea.  No chest pain unless he is coughing, abdominal muscles sore as well.  Lives with several roommates but none are sick.  Has been smoking since he was a teenager about 1 ppd. Still drinking heavily as well 3-5 40oz per day.          ED course: In the Ed patient was tested for coronavirus which was neg, mildly elevated BNP and d dimer noted, Ferritin 536.  Normal fibrinogen, procalcitonin, LDH,  TG's, CMP.  Pt was given albuterol and solumedrol with some improvement in symptoms.  Meds:  No outpatient medications have been marked as taking for the 10/06/18 encounter [facility](Hospital Encounter).     Allergies: Allergies as of 10/06/2018  . (No Known Allergies)   Past Medical History:  Diagnosis Date  . Alcoholism (HCC)   . Blind left eye   . CVA (cerebral vascular accident) (HCC)    12 years ago- residual R sided numbness    Family  History: No family history on file.   Social History:  Social History   Socioeconomic History  . Marital status: Single    Spouse name: Not on file  . Number of children: Not on file  . Years of education: Not on file  . Highest education level: Not on file  Occupational History  . Not on file  Social Needs  . Financial resource strain: Not on file  . Food insecurity:    Worry: Not on file    Inability: Not on file  . Transportation needs:    Medical: Not on file    Non-medical: Not on file  Tobacco Use  . Smoking status: Current Every Day Smoker    Packs/day: 1.00  . Smokeless tobacco: Never Used  Substance and Sexual Activity  . Alcohol use: Yes    Comment: 5 40 oz/day  . Drug use: Not Currently  . Sexual activity: Not on file  Lifestyle  . Physical activity:    Days per week: Not on file    Minutes per session: Not on file  . Stress: Not on file  Relationships  . Social connections:    Talks on phone: Not on file    Gets together: Not on file    Attends religious  service: Not on file    Active member of club or organization: Not on file    Attends meetings of clubs or organizations: Not on file    Relationship status: Not on file  . Intimate partner violence:    Fear of current or ex partner: Not on file    Emotionally abused: Not on file    Physically abused: Not on file    Forced sexual activity: Not on file  Other Topics Concern  . Not on file  Social History Narrative  . Not on file     Review of Systems: A complete ROS was negative except as per HPI.   Physical Exam: Blood pressure 136/81, pulse (!) 105, temperature 98.2 F (36.8 C), temperature source Oral, resp. rate (!) 24, height 5\' 7"  (1.702 m), weight 86.2 kg, SpO2 98 %. Physical Exam Constitutional:      Appearance: He is not diaphoretic.  HENT:     Head: Normocephalic and atraumatic.  Eyes:     General: No scleral icterus.       Right eye: No discharge.        Left eye: No discharge.   Neck:     Musculoskeletal: Normal range of motion and neck supple.  Cardiovascular:     Rate and Rhythm: Normal rate and regular rhythm.     Heart sounds: Normal heart sounds. No murmur. No friction rub. No gallop.      Comments: Heart sounds difficult due to wheezing Pulmonary:     Effort: Pulmonary effort is normal. Tachypnea (mild) present. No respiratory distress.     Breath sounds: Wheezing (throughout all lung fields) present. No rales.     Comments: He did not cough during our interview Chest:     Chest wall: No tenderness or crepitus.  Abdominal:     General: Bowel sounds are normal. There is no distension.     Palpations: Abdomen is soft. There is no mass.     Tenderness: There is no abdominal tenderness. There is no guarding.  Musculoskeletal:     Right lower leg: No edema.     Left lower leg: No edema.  Skin:    General: Skin is warm and dry.  Neurological:     Mental Status: He is alert.  Psychiatric:        Mood and Affect: Mood normal.        Behavior: Behavior normal.     EKG: personally reviewed my interpretation is Sinus tach, RAE, wander artifact  CXR: personally reviewed my interpretation is no acute cardiopulmonary disease    Assessment & Plan by Problem: Active Problems:   COPD (chronic obstructive pulmonary disease) (HCC)  Dyspnea on exertion: Long smoking history, wheezing almost certainly has COPD and will treat for exacerbation  -duonebs Q6 -prednisone 40 daily starting tomorrow -azithromycin -COPD education ordered with inhaler training -care management as no insurance pcp or med access  Alcohol use: pt long standing heavy alcohol usage  -CIWA w/ativan -vitamin supplementation thiamine/folate  HTN: mildly hypertensive as well in the setting of acute exacerbation  -monitor and pcp follow up  Macrocytosis: Likely related to alcohol use +/-  Folate/B12 deficienncy which also may be related to heavy alcohol use.  -thiamine/folate  supplementation -B12, Folate labs   Dispo: Admit patient to Inpatient with expected length of stay greater than 2 midnights.  Signed: Angelita Ingles, MD 10/06/2018, 7:58 AM

## 2018-10-06 NOTE — TOC Initial Note (Signed)
Transition of Care [facility]) - Initial/Assessment Note    Patient Details  Name: Robert Frank MRN: [id number] Date of Birth: October 01, 1965  [facility] of [facility]) CM/SW Contact:    Maree Krabbe, LCSW Phone Number: 10/06/2018, 11:13 AM  Clinical Narrative:      CSW spoke with pt via telephone. Pt states he does not have a PCP. CSW will make pt an appointment at [facility] and [facility]- pt agreeable. Pt states he doesn't have a phone but if we put a phone for him to call for telehealth apt then he could barrow someone's phone. Pt states he is not able to afford medications. Pt will be provided Match letter prior to d/c. Pt does not have transport home at d/c. Pt will need taxi as bus system is no longer running due to COVID-19. Pt is agreeable to [facility] with [facility] as Charity. CSW spoke with Kandee Keen at [place] to make the referral.      Expected Discharge Plan: Home/Self Care Barriers to Discharge: Continued Medical Work up   Patient Goals and CMS Choice        Expected Discharge Plan and Services Expected Discharge Plan: Home/Self Care In-house Referral: NA Discharge Planning Services: MATCH Program, [facility], Medication Assistance, Follow-up appt scheduled Post Acute Care Choice: Home Health Living arrangements for the past 2 months: Single Family Home                           HH Arranged: Disease Management, RN HH Agency: [facility] Date [facility] Agency Contacted: 10/06/18 Time HH Agency Contacted: 1112 Representative spoke with at [facility] Agency: Denyse Amass  Prior Living Arrangements/Services Living arrangements for the past 2 months: Single Family Home Lives with:: Self Patient language and need for interpreter reviewed:: Yes Do you feel safe going back to the place where you live?: Yes      Need for Family Participation in Patient Care: No (Comment) Care giver support system in place?: No (comment)   Criminal Activity/Legal Involvement  Pertinent to Current Situation/Hospitalization: No - Comment as needed  Activities of Daily Living      Permission Sought/Granted                  Emotional Assessment Appearance:: Appears stated age Attitude/Demeanor/Rapport: (pt was appropriate) Affect (typically observed): Accepting, Appropriate, Calm Orientation: : Oriented to Self, Oriented to Place, Oriented to  Time, Oriented to Situation Alcohol / Substance Use: Not Applicable Psych Involvement: No (comment)  Admission diagnosis:  COPD exacerbation (HCC) [J44.1] COPD with acute exacerbation (HCC) [J44.1] Patient Active Problem List   Diagnosis Date Noted  . COPD (chronic obstructive pulmonary disease) (HCC) 10/06/2018  . COPD with acute exacerbation (HCC) 10/06/2018   PCP:  Patient, No Pcp Per Pharmacy:   [id number] - [place], [place] - [address] AT [facility] OF [address] [place] [address] Phone: [phone] Fax: [phone]     Social Determinants of Health (SDOH) Interventions    Readmission Risk Interventions No flowsheet data found.

## 2018-10-07 ENCOUNTER — Encounter (HOSPITAL_COMMUNITY): Payer: Self-pay

## 2018-10-07 DIAGNOSIS — D7589 Other specified diseases of blood and blood-forming organs: Secondary | ICD-10-CM

## 2018-10-07 DIAGNOSIS — J441 Chronic obstructive pulmonary disease with (acute) exacerbation: Principal | ICD-10-CM

## 2018-10-07 DIAGNOSIS — I10 Essential (primary) hypertension: Secondary | ICD-10-CM

## 2018-10-07 DIAGNOSIS — Z720 Tobacco use: Secondary | ICD-10-CM

## 2018-10-07 DIAGNOSIS — Z8673 Personal history of transient ischemic attack (TIA), and cerebral infarction without residual deficits: Secondary | ICD-10-CM

## 2018-10-07 DIAGNOSIS — Z7289 Other problems related to lifestyle: Secondary | ICD-10-CM

## 2018-10-07 DIAGNOSIS — Z79899 Other long term (current) drug therapy: Secondary | ICD-10-CM

## 2018-10-07 HISTORY — DX: Chronic obstructive pulmonary disease with (acute) exacerbation: J44.1

## 2018-10-07 LAB — RAPID URINE DRUG SCREEN, HOSP PERFORMED
Amphetamines: NOT DETECTED
Barbiturates: NOT DETECTED
Benzodiazepines: NOT DETECTED
Cocaine: NOT DETECTED
Opiates: NOT DETECTED
Tetrahydrocannabinol: NOT DETECTED

## 2018-10-07 LAB — HEMOGLOBIN A1C
Hgb A1c MFr Bld: 5.1 % (ref 4.8–5.6)
Mean Plasma Glucose: 99.67 mg/dL

## 2018-10-07 LAB — HIV ANTIBODY (ROUTINE TESTING W REFLEX): HIV Screen 4th Generation wRfx: NONREACTIVE

## 2018-10-07 LAB — FOLATE RBC
Folate, Hemolysate: 591 ng/mL
Folate, RBC: 1334 ng/mL (ref >498)
Hematocrit: 44.3 % (ref 37.5–51.0)

## 2018-10-07 MED ORDER — PREDNISONE 20 MG PO TABS: 40.0000 mg | ORAL_TABLET | Freq: Every day | ORAL | 0 refills | Status: AC

## 2018-10-07 MED ORDER — VITAMIN B-12 1000 MCG PO TABS: 1000.0000 ug | ORAL_TABLET | Freq: Every day | ORAL | 0 refills | Status: AC

## 2018-10-07 MED ORDER — ALBUTEROL SULFATE HFA 108 (90 BASE) MCG/ACT IN AERS: 2.0000 | INHALATION_SPRAY | Freq: Four times a day (QID) | RESPIRATORY_TRACT | 2 refills | Status: DC | PRN

## 2018-10-07 MED ORDER — CYANOCOBALAMIN 1000 MCG/ML IJ SOLN
1000.0000 ug | Freq: Once | INTRAMUSCULAR | Status: DC
  Filled 2018-10-07: qty 1

## 2018-10-07 MED ORDER — ASPIRIN EC 81 MG PO TBEC: 81.0000 mg | DELAYED_RELEASE_TABLET | Freq: Every day | ORAL | 0 refills | Status: AC

## 2018-10-07 MED ORDER — AMLODIPINE BESYLATE 5 MG PO TABS: 5.0000 mg | ORAL_TABLET | Freq: Every day | ORAL | 0 refills | Status: DC

## 2018-10-07 MED ORDER — UMECLIDINIUM BROMIDE 62.5 MCG/INH IN AEPB: 1.0000 | INHALATION_SPRAY | Freq: Every day | RESPIRATORY_TRACT | 0 refills | Status: AC

## 2018-10-07 MED ORDER — AZITHROMYCIN 250 MG PO TABS: ORAL_TABLET | ORAL | 0 refills | Status: DC

## 2018-10-07 MED ORDER — FOLIC ACID 1 MG PO TABS: 1.0000 mg | ORAL_TABLET | Freq: Every day | ORAL | 0 refills | Status: AC

## 2018-10-07 MED ORDER — THIAMINE HCL 100 MG PO TABS: 100.0000 mg | ORAL_TABLET | Freq: Every day | ORAL | 0 refills | Status: AC

## 2018-10-07 MED ORDER — AMLODIPINE BESYLATE 5 MG PO TABS
5.0000 mg | ORAL_TABLET | Freq: Every day | ORAL | Status: DC
  Administered 2018-10-07: 5 mg via ORAL
  Filled 2018-10-07: qty 1

## 2018-10-07 MED FILL — INCRUSE ELLIPTA 62.5 MCG IN: 62.5 | 30 days supply | Qty: 30 | Fill #0

## 2018-10-07 MED FILL — FOLIC ACID 1 MG TABS: 1 | 30 days supply | Qty: 30 | Fill #0

## 2018-10-07 MED FILL — predniSONE 20 MG TABS: 20 | 3 days supply | Qty: 6 | Fill #0

## 2018-10-07 MED FILL — VENTOLIN HFA 90 MCG INHALER: 108 (90 BAS | 25 days supply | Qty: 18 | Fill #0 | Status: TO

## 2018-10-07 MED FILL — B-12 1000 MCG TABS: 1000 | 30 days supply | Qty: 30 | Fill #0

## 2018-10-07 MED FILL — THIAMINE HCL 100 MG TABS: 100 | 30 days supply | Qty: 30 | Fill #0

## 2018-10-07 MED FILL — AMLODIPINE BESYLATE 5 MG TA: 5 | 30 days supply | Qty: 30 | Fill #0

## 2018-10-07 MED FILL — AZITHROMYCIN 250 MG TABLET: 250 | 3 days supply | Qty: 6 | Fill #0

## 2018-10-07 MED FILL — ASPIRIN LOW DOSE 81 MG TBEC: 81 | 30 days supply | Qty: 30 | Fill #0

## 2018-10-07 NOTE — Progress Notes (Signed)
   Subjective:  He states his breathing has significantly improved. He is still has some mild coughing and still bringing up some phlegm but is overall feeling much better. Discussed inhalers that he would need to take when he went home.   Objective:  Vital signs in last 24 hours: Vitals:   10/06/18 1721 10/06/18 2005 10/06/18 2330 10/07/18 0411  BP: (!) 153/97 (!) 143/100 (!) 150/109 (!) 162/102  Pulse: 93 88 84 92  Resp:      Temp: 98.7 F (37.1 C)  98.6 F (37 C) 98.2 F (36.8 C)  TempSrc: Oral  Oral Oral  SpO2: 97% 100% 96% 99%  Weight:    96.2 kg  Height:       Constitution: NAD, sitting at bedside Cardio: RRR; no m/r/g Respiratory: normal effort on room air; good air movement, scattered end expiratory wheezes  Abdominal: BS+; abdomen is soft, non-distended, non-tender MSK: no edema   Assessment/Plan:  Active Problems:   COPD (chronic obstructive pulmonary disease) (HCC)   COPD with acute exacerbation (HCC)  1. DOE 2/2 COPD exacerbation - has remained on room air; clinically improving - continue Azithromycin and Prednisone for total of 5 days - patient is medically stable for discharge; will provide Albuterol and Incruse. He has been instructed on how to properly use inhalers - will plan to follow-up at [facility] to establish care with PCP; will need PFTs to confirm diagnosis of COPD - appreciate CSW assistance with resources   2. Alcohol use disorder - CIWA scores have remained low without requiring Ativan   3. Macrocytosis  - B12 low; folate pending - will give IM B12 and discharge on PO B12 and folate   4. HTN - blood pressure has been persistently elevated - will initiate Amlodipine 5 mg   5. History of CVA - not currently on any medications; will prescribe baby aspirin at discharge - follow-up with PCP for lipid profile - A1C 5.1   Dispo: Anticipated discharge home today.   Lenward Chancellor D, DO 10/07/2018, 6:30 AM Pager:  [phone]

## 2018-10-07 NOTE — Discharge Summary (Signed)
Name: Robert Frank MRN: [id number] DOB: 10/28/1965 53 y.o. PCP: Patient, No Pcp Per  Date of Admission: 10/06/2018  3:36 AM Date of Discharge: 4/30/20204/30/20 Attending Physician: No att. providers found  Discharge Diagnosis: 1. DOE 2/2 COPD exacerbation 2. HTN 3. History of CVA  Discharge Medications: Allergies as of 10/07/2018   No Known Allergies     Medication List    STOP taking these medications   potassium chloride SA 20 MEQ tablet Commonly known as:  K-DUR     TAKE these medications   albuterol 108 (90 Base) MCG/ACT inhaler Commonly known as:  VENTOLIN HFA Inhale 2 puffs into the lungs every 6 (six) hours as needed for wheezing or shortness of breath.   amLODipine 5 MG tablet Commonly known as:  NORVASC Take 1 tablet (5 mg total) by mouth daily.   aspirin EC 81 MG tablet Take 1 tablet (81 mg total) by mouth daily.   azithromycin 250 MG tablet Commonly known as:  ZITHROMAX Take 2 tablets daily for 3 days.   folic acid 1 MG tablet Commonly known as:  FOLVITE Take 1 tablet (1 mg total) by mouth daily for 30 days.   predniSONE 20 MG tablet Commonly known as:  DELTASONE Take 2 tablets (40 mg total) by mouth daily with breakfast for 3 days.   thiamine 100 MG tablet Take 1 tablet (100 mg total) by mouth daily for 30 days.   umeclidinium bromide 62.5 MCG/INH Aepb Commonly known as:  Incruse Ellipta Inhale 1 puff into the lungs daily for 30 days.   vitamin B-12 1000 MCG tablet Commonly known as:  CYANOCOBALAMIN Take 1 tablet (1,000 mcg total) by mouth daily for 30 days.       Disposition and follow-up:   Robert Frank was discharged from [facility] in Good condition.  At the hospital follow up visit please address:  1.  DOE 2/2 COPD exacerbation: please evaluate respiratory status and continued symptom improvement. He has never had established primary care and therefore does not have a formal diagnosis of COPD. Will need  PFTs. He was initiated on Incruse and Albuterol for maintenance therapy at discharge. Treated with 5 day course of Prednisone and Azithro to treat exacerbation.   2.  Labs / imaging needed at time of follow-up: lipid profile   3.  Pending labs/ test needing follow-up: none  Follow-up Appointments: Follow-up Information    [facility]. Go on 10/14/2018.   Why:  This will be a telephone appt at 2:10pm Contact information: [address] [place] [address] [phone]       Care, [facility] Follow up.   Specialty:  Home Health Services Why:  [facility] Contact information: [address] [place] [address] [phone]           Hospital Course by problem list: 1. DOE 2/2 COPD exacerbation: [name] is a 53 y/o gentleman with history of CVA, tobacco and alcohol use who presented with 4 day history of progressive dyspnea with associated productive cough and wheezing. He has never had a primary care doctor or been on any medications. Endorsed longstanding tobacco history as well as a chronic cough that had worsened the last few days. Based on clinical presentation he was treated for COPD exacerbation with Prednisone, Azithro and duonebs. His dyspnea and wheezing improved significantly over the next 24 hours. He was instructed on proper inhaler use and provided Albuterol and  Incruse for maintenance therapy. He will need PFTs through PCP to confirm diagnosis of COPD.    2. Macrocytosis: Found to have low B12. Likely in the setting of alcohol use. He was initiated on B12 and folate supplementation.   3. HTN: blood pressure was persistently elevated throughout admission. He was initiated on Amlodipine 5 mg. PCP can continue titrating based on ambulatory blood pressures.   4. History of CVA: not currently on any medications. Prescribed baby aspirin at discharge. He will need lipid profile at PCP follow-up. A1C was 5.1.     Discharge Vitals:   BP (!) 140/91 (BP Location: Right Arm)   Pulse 94   Temp 98.5 F (36.9 C) (Oral)   Resp 18   Ht 5\' 7"  (1.702 m)   Wt 96.2 kg   SpO2 99%   BMI 33.22 kg/m   Pertinent Labs, Studies, and Procedures:  BMP Latest Ref Rng & Units 10/06/2018 11/22/2017  Glucose 70 - 99 mg/dL 161(W124(H) 70  BUN 6 - 20 mg/dL 9 5(L)  Creatinine 9.600.61 - 1.24 mg/dL 4.540.79 0.98(J0.42(L)  Sodium 191135 - 145 mmol/L 137 140  Potassium 3.5 - 5.1 mmol/L 4.0 2.7(LL)  Chloride 98 - 111 mmol/L 95(L) 118(H)  CO2 22 - 32 mmol/L 31 15(L)  Calcium 8.9 - 10.3 mg/dL 9.1 4.7(WG4.9(LL)     Discharge Instructions: Discharge Instructions    Diet - low sodium heart healthy   Complete by:  As directed    Discharge instructions   Complete by:  As directed    [name], you were treated in the hospital for a COPD exacerbation. You will continue the antibiotics (Azithromycin) and Prednisone for 3 more days. You are also being prescribed 2 inhalers. The Incruse inhaler you will use once daily. The albuterol will serve as your rescue inhaler to use as needed for wheezing or shortness of breath. It will be important for [facility] to [facility] to have [facility] done on your lungs.   For your blood pressure, we have started you on Amlodipine (Norvasc) once daily.   Please also take a baby aspirin every day to decrease your risk of having another stroke.   Take care!   Increase activity slowly   Complete by:  As directed       Signed: Bridget HartshornBloomfield, [name], DO 10/08/2018, 2:02 PM   Pager: [phone]

## 2018-10-07 NOTE — Evaluation (Signed)
Physical Therapy Evaluation Patient Details Name: Robert Frank MRN: [id number] DOB: 21-Jul-1965 Today's Date: 10/07/2018   History of Present Illness  Pt is a 53 y/o male admitted secondary to dyspnea. COVID testing was negative on 10/06/18. Pt found to have a COPD exacerbation. PMH including but not limited to alcohol abuse and CVA.    Clinical Impression  Pt presented sitting EOB, awake and willing to participate in therapy session. Prior to admission, pt reported that he was independent with all functional mobility and ADLs. Pt lives with his brother in a singe level home with three steps to enter. At the time of evaluation, pt at min guard level overall for mobility without use of an AD. Pt with DOE during ambulation (2/4 on the dyspnea scale). Pt on RA throughout with SPO2 maintaining >96%. PT will continue to follow acutely to progress mobility as tolerated.     Follow Up Recommendations No PT follow up    Equipment Recommendations  None recommended by PT    Recommendations for Other Services       Precautions / Restrictions Precautions Precautions: None Restrictions Weight Bearing Restrictions: No      Mobility  Bed Mobility               General bed mobility comments: pt sitting EOB upon arrival  Transfers Overall transfer level: Needs assistance Equipment used: None Transfers: Sit to/from Stand Sit to Stand: Min guard            Ambulation/Gait Ambulation/Gait assistance: Min guard Gait Distance (Feet): 100 Feet Assistive device: None Gait Pattern/deviations: Step-through pattern;Decreased stride length;Drifts right/left Gait velocity: decr   General Gait Details: pt with mild instability but no overt LOB or need for physical assistance, min guard for safety; pt on RA throughout with SPO2 maintaining at >96%  Stairs            Wheelchair Mobility    Modified Rankin (Stroke Patients Only)       Balance Overall balance assessment:  Needs assistance Sitting-balance support: No upper extremity supported;Feet supported Sitting balance-Leahy Scale: Good     Standing balance support: No upper extremity supported Standing balance-Leahy Scale: Fair                               Pertinent Vitals/Pain Pain Assessment: No/denies pain    Home Living Family/patient expects to be discharged to:: Private residence Living Arrangements: Other relatives Available Help at Discharge: Family;Available 24 hours/day Type of Home: House Home Access: Stairs to enter Entrance Stairs-Rails: Doctor, general practiceight;Left Entrance Stairs-Number of Steps: 3 Home Layout: One level Home Equipment: Environmental consultantWalker - 2 wheels      Prior Function Level of Independence: Independent         Comments: does not drive - usually walks     Hand Dominance        Extremity/Trunk Assessment   Upper Extremity Assessment Upper Extremity Assessment: Overall WFL for tasks assessed    Lower Extremity Assessment Lower Extremity Assessment: Overall WFL for tasks assessed    Cervical / Trunk Assessment Cervical / Trunk Assessment: Normal  Communication   Communication: No difficulties  Cognition Arousal/Alertness: Awake/alert Behavior During Therapy: WFL for tasks assessed/performed Overall Cognitive Status: No family/caregiver present to determine baseline cognitive functioning Area of Impairment: Problem solving  Problem Solving: Slow processing General Comments: very likely close to baseline      General Comments      Exercises     Assessment/Plan    PT Assessment Patient needs continued PT services  PT Problem List Decreased mobility;Decreased coordination;Decreased balance;Decreased safety awareness;Decreased knowledge of precautions       PT Treatment Interventions Gait training;DME instruction;Stair training;Functional mobility training;Therapeutic activities;Therapeutic exercise;Balance  training;Neuromuscular re-education;Cognitive remediation;Patient/family education    PT Goals (Current goals can be found in the Care Plan section)  Acute Rehab PT Goals Patient Stated Goal: "to go home" PT Goal Formulation: With patient Time For Goal Achievement: 10/21/18 Potential to Achieve Goals: Good    Frequency Min 3X/week   Barriers to discharge        Co-evaluation               AM-PAC PT "6 Clicks" Mobility  Outcome Measure Help needed turning from your back to your side while in a flat bed without using bedrails?: None Help needed moving from lying on your back to sitting on the side of a flat bed without using bedrails?: None Help needed moving to and from a bed to a chair (including a wheelchair)?: None Help needed standing up from a chair using your arms (e.g., wheelchair or bedside chair)?: None Help needed to walk in hospital room?: A Little Help needed climbing 3-5 steps with a railing? : A Little 6 Click Score: 22    End of Session Equipment Utilized During Treatment: Gait belt Activity Tolerance: Patient tolerated treatment well Patient left: in bed;with call bell/phone within reach(sitting EOB) Nurse Communication: Mobility status PT Visit Diagnosis: Unsteadiness on feet (R26.81);Other abnormalities of gait and mobility (R26.89)    Time: 1020-1035 PT Time Calculation (min) (ACUTE ONLY): 15 min   Charges:   PT Evaluation $PT Eval Moderate Complexity: 1 Mod          Deborah Chalk, PT, DPT  Acute Rehabilitation Services Pager [phone] Office [phone]    [name] 10/07/2018, 11:39 AM

## 2018-10-07 NOTE — Evaluation (Signed)
Occupational Therapy Evaluation Patient Details Name: Robert Frank MRN: [id number] DOB: 1966-03-29 Today's Date: 10/07/2018    History of Present Illness Pt is a 53 y/o male admitted secondary to dyspnea. COVID testing was negative on 10/06/18. Pt found to have a COPD exacerbation. PMH including but not limited to alcohol abuse and CVA.   Clinical Impression   Pt PTA: living with brother. Pt currently, pt performing ADL functional mobility no AD and fair balance. Pt showering with set-upA. Pt donning clothes with increased time with no assist required. O2 sats 100% on RA and HR 107 BPM with activity. Pt does not require continued OT skilled services. OT  Signing off.    Follow Up Recommendations  No OT follow up    Equipment Recommendations  None recommended by OT    Recommendations for Other Services       Precautions / Restrictions Precautions Precautions: None Restrictions Weight Bearing Restrictions: No      Mobility Bed Mobility               General bed mobility comments: pt sitting EOB upon arrival  Transfers Overall transfer level: Needs assistance Equipment used: None Transfers: Sit to/from Stand Sit to Stand: Min guard              Balance Overall balance assessment: Needs assistance Sitting-balance support: No upper extremity supported;Feet supported Sitting balance-Leahy Scale: Good     Standing balance support: No upper extremity supported Standing balance-Leahy Scale: Fair                             ADL either performed or assessed with clinical judgement   ADL Overall ADL's : At baseline                                       General ADL Comments: requires increased rest breaks and increased time, but able to perform tasks with modified independence as long as pt can sit down.     Vision Baseline Vision/History: (L eye blindness) Patient Visual Report: No change from baseline Vision Assessment?: No  apparent visual deficits     Perception     Praxis      Pertinent Vitals/Pain Pain Assessment: No/denies pain     Hand Dominance     Extremity/Trunk Assessment Upper Extremity Assessment Upper Extremity Assessment: Overall WFL for tasks assessed   Lower Extremity Assessment Lower Extremity Assessment: Overall WFL for tasks assessed   Cervical / Trunk Assessment Cervical / Trunk Assessment: Normal   Communication Communication Communication: No difficulties   Cognition Arousal/Alertness: Awake/alert Behavior During Therapy: WFL for tasks assessed/performed Overall Cognitive Status: No family/caregiver present to determine baseline cognitive functioning Area of Impairment: Problem solving                             Problem Solving: Slow processing     General Comments  Pt showered sitting on BSC with set-upA. fair balance in standing    Exercises     Shoulder Instructions      Home Living Family/patient expects to be discharged to:: Private residence Living Arrangements: Other relatives Available Help at Discharge: Family;Available 24 hours/day Type of Home: House Home Access: Stairs to enter [employer] of Steps: 3 Entrance Stairs-Rails: Right;Left Home Layout: One level  Bathroom Shower/Tub: DietitianTub/shower unit         Home Equipment: Walker - 2 wheels          Prior Functioning/Environment Level of Independence: Independent        Comments: does not drive - usually walks        OT Problem List: Decreased strength;Decreased activity tolerance;Impaired balance (sitting and/or standing);Decreased coordination;Decreased safety awareness      OT Treatment/Interventions:      OT Goals(Current goals can be found in the care plan section) Acute Rehab OT Goals Patient Stated Goal: "to go home" OT Goal Formulation: With patient Potential to Achieve Goals: Good  OT Frequency:     Barriers to D/C:             Co-evaluation              AM-PAC OT "6 Clicks" Daily Activity     Outcome Measure Help from another person eating meals?: None Help from another person taking care of personal grooming?: None Help from another person toileting, which includes using toliet, bedpan, or urinal?: None Help from another person bathing (including washing, rinsing, drying)?: None Help from another person to put on and taking off regular upper body clothing?: None Help from another person to put on and taking off regular lower body clothing?: None 6 Click Score: 24   End of Session Nurse Communication: Mobility status  Activity Tolerance: Patient tolerated treatment well Patient left: in chair;with call bell/phone within reach  OT Visit Diagnosis: Unsteadiness on feet (R26.81);Muscle weakness (generalized) (M62.81)                Time: 9147-82951418-1456 OT Time Calculation (min): 38 min Charges:  OT General Charges $OT Visit: 1 Visit OT Evaluation $OT Eval Moderate Complexity: 1 Mod OT Treatments $Self Care/Home Management : 23-37 mins  Revonda StandardAllison Cecil Cranker(Jelenek) [place] OTR/L Acute Rehabilitation Services Pager: [phone] Office: [phone]   Robert Frank 10/07/2018, 4:59 PM

## 2018-10-11 LAB — CULTURE, BLOOD (ROUTINE X 2)
Culture: NO GROWTH
Culture: NO GROWTH
Special Requests: ADEQUATE

## 2018-10-14 ENCOUNTER — Inpatient Hospital Stay: Payer: Self-pay | Admitting: Primary Care

## 2019-08-16 ENCOUNTER — Emergency Department (HOSPITAL_COMMUNITY): Payer: Self-pay

## 2019-08-16 ENCOUNTER — Other Ambulatory Visit: Payer: Self-pay

## 2019-08-16 ENCOUNTER — Inpatient Hospital Stay (HOSPITAL_COMMUNITY)
Admission: EM | Admit: 2019-08-16 | Discharge: 2019-08-18 | DRG: 871 | Discharge status: Against Medical Advice | Payer: Self-pay | Attending: Internal Medicine | Admitting: Internal Medicine

## 2019-08-16 DIAGNOSIS — F10239 Alcohol dependence with withdrawal, unspecified: Secondary | ICD-10-CM | POA: Diagnosis present

## 2019-08-16 DIAGNOSIS — Z20822 Contact with and (suspected) exposure to covid-19: Secondary | ICD-10-CM | POA: Diagnosis present

## 2019-08-16 DIAGNOSIS — F1020 Alcohol dependence, uncomplicated: Secondary | ICD-10-CM | POA: Diagnosis present

## 2019-08-16 DIAGNOSIS — Z79899 Other long term (current) drug therapy: Secondary | ICD-10-CM

## 2019-08-16 DIAGNOSIS — Z6832 Body mass index (BMI) 32.0-32.9, adult: Secondary | ICD-10-CM

## 2019-08-16 DIAGNOSIS — F10930 Alcohol use, unspecified with withdrawal, uncomplicated: Secondary | ICD-10-CM

## 2019-08-16 DIAGNOSIS — G9341 Metabolic encephalopathy: Secondary | ICD-10-CM | POA: Diagnosis present

## 2019-08-16 DIAGNOSIS — A419 Sepsis, unspecified organism: Secondary | ICD-10-CM | POA: Diagnosis present

## 2019-08-16 DIAGNOSIS — F10230 Alcohol dependence with withdrawal, uncomplicated: Secondary | ICD-10-CM

## 2019-08-16 DIAGNOSIS — J449 Chronic obstructive pulmonary disease, unspecified: Secondary | ICD-10-CM | POA: Diagnosis present

## 2019-08-16 DIAGNOSIS — F10939 Alcohol use, unspecified with withdrawal, unspecified: Secondary | ICD-10-CM | POA: Diagnosis present

## 2019-08-16 DIAGNOSIS — L03311 Cellulitis of abdominal wall: Secondary | ICD-10-CM | POA: Diagnosis present

## 2019-08-16 DIAGNOSIS — I10 Essential (primary) hypertension: Secondary | ICD-10-CM | POA: Diagnosis present

## 2019-08-16 DIAGNOSIS — Z7982 Long term (current) use of aspirin: Secondary | ICD-10-CM

## 2019-08-16 DIAGNOSIS — M793 Panniculitis, unspecified: Secondary | ICD-10-CM | POA: Diagnosis present

## 2019-08-16 DIAGNOSIS — L0390 Cellulitis, unspecified: Secondary | ICD-10-CM | POA: Diagnosis present

## 2019-08-16 DIAGNOSIS — H5462 Unqualified visual loss, left eye, normal vision right eye: Secondary | ICD-10-CM | POA: Diagnosis present

## 2019-08-16 DIAGNOSIS — A4189 Other specified sepsis: Principal | ICD-10-CM | POA: Diagnosis present

## 2019-08-16 DIAGNOSIS — E669 Obesity, unspecified: Secondary | ICD-10-CM | POA: Diagnosis present

## 2019-08-16 DIAGNOSIS — F17210 Nicotine dependence, cigarettes, uncomplicated: Secondary | ICD-10-CM | POA: Diagnosis present

## 2019-08-16 DIAGNOSIS — G9340 Encephalopathy, unspecified: Secondary | ICD-10-CM | POA: Diagnosis present

## 2019-08-16 DIAGNOSIS — R52 Pain, unspecified: Secondary | ICD-10-CM

## 2019-08-16 DIAGNOSIS — I69398 Other sequelae of cerebral infarction: Secondary | ICD-10-CM

## 2019-08-16 DIAGNOSIS — Z5329 Procedure and treatment not carried out because of patient's decision for other reasons: Secondary | ICD-10-CM | POA: Diagnosis not present

## 2019-08-16 DIAGNOSIS — L84 Corns and callosities: Secondary | ICD-10-CM | POA: Diagnosis present

## 2019-08-16 DIAGNOSIS — R200 Anesthesia of skin: Secondary | ICD-10-CM | POA: Diagnosis present

## 2019-08-16 DIAGNOSIS — D7589 Other specified diseases of blood and blood-forming organs: Secondary | ICD-10-CM | POA: Diagnosis present

## 2019-08-16 LAB — ETHANOL: Alcohol, Ethyl (B): <10 mg/dL (ref <10)

## 2019-08-16 LAB — I-STAT CHEM 8, ED
BUN: 4 mg/dL — ABNORMAL LOW (ref 6–20)
Calcium, Ion: 1.03 mmol/L — ABNORMAL LOW (ref 1.15–1.40)
Chloride: 98 mmol/L (ref 98–111)
Creatinine, Ser: 0.8 mg/dL (ref 0.61–1.24)
Glucose, Bld: 155 mg/dL — ABNORMAL HIGH (ref 70–99)
HCT: 47 % (ref 39.0–52.0)
Hemoglobin: 16 g/dL (ref 13.0–17.0)
Potassium: 3.4 mmol/L — ABNORMAL LOW (ref 3.5–5.1)
Sodium: 135 mmol/L (ref 135–145)
TCO2: 25 mmol/L (ref 22–32)

## 2019-08-16 LAB — APTT: aPTT: 30 seconds (ref 24–36)

## 2019-08-16 LAB — PROTIME-INR
INR: 1 (ref 0.8–1.2)
Prothrombin Time: 13.5 seconds (ref 11.4–15.2)

## 2019-08-16 LAB — POC SARS CORONAVIRUS 2 AG -  ED: SARS Coronavirus 2 Ag: NEGATIVE

## 2019-08-16 MED ORDER — CEFAZOLIN SODIUM-DEXTROSE 1-4 GM/50ML-% IV SOLN
1.0000 g | Freq: Once | INTRAVENOUS | Status: AC
  Administered 2019-08-16: 23:00:00 1 g via INTRAVENOUS
  Filled 2019-08-16: qty 50

## 2019-08-16 MED ORDER — ACETAMINOPHEN 325 MG PO TABS
650.0000 mg | ORAL_TABLET | Freq: Once | ORAL | Status: AC
  Administered 2019-08-16: 650 mg via ORAL
  Filled 2019-08-16: qty 2

## 2019-08-16 MED ORDER — THIAMINE HCL 100 MG/ML IJ SOLN
100.0000 mg | Freq: Once | INTRAMUSCULAR | Status: AC
  Administered 2019-08-16: 23:00:00 100 mg via INTRAVENOUS
  Filled 2019-08-16: qty 2

## 2019-08-16 MED ORDER — SODIUM CHLORIDE 0.9 % IV BOLUS
1000.0000 mL | Freq: Once | INTRAVENOUS | Status: AC
  Administered 2019-08-16: 23:00:00 1000 mL via INTRAVENOUS

## 2019-08-16 NOTE — ED Notes (Signed)
Dr Madilyn Hook informed of POC covid test neg

## 2019-08-16 NOTE — ED Triage Notes (Signed)
Brought in via EMS after being found down in driveway of local business. CBG 127 in route, vitals stable. Pt reports drinking today and infection around lower abdomen. Pt poor historian, clothing soiled, and slow to respond to questions. A/Ox2 at this time.

## 2019-08-16 NOTE — ED Provider Notes (Signed)
11:10 PM  Assumed care.  Patient is a 54yo M that was found unresponsive in driveway.  H/o alcohol abuse, previous CVA.  Erythema to R foot.  Fever to 101.  No longer AMS.  No headache or neck pain.  Labs, COVID antigen, urine pending.  Rcvd Ancef for possible cellulitis.  1:00 AM  On my reevaluation, patient appears to be in withdrawal.  He is tremulous, tachycardic.  Oriented to person and place but not year.  States it is 2012.  He denies any pain.  He is not sure what happened today.  Denies headache, neck pain, chest pain or shortness of breath, vomiting or diarrhea.  He has areas of cellulitis to his lower pannus and bilateral feet.  I suspect this is the cause of his fever.  Chest x-ray, urine, Covid antigen all unremarkable.  Alcohol level is 0.  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  He is receiving IV fluids, broad-spectrum antibiotics, thiamine, Ativan.  Initial lactate was 7.2 but has improved to 2.2 after 1 L of IV fluids.  Will admit to medicine.  1:30 AM Discussed patient's case with hospitalist, Dr. Antionette Char.  I have recommended admission and patient (and family if present) agree with this plan. Admitting physician will place admission orders.   I reviewed all nursing notes, vitals, pertinent previous records and interpreted all EKGs, lab and urine results, imaging (as available).      Patient's pannus.  CRITICAL CARE Performed by: Rochele Raring   Total critical care time: 40 minutes  Critical care time was exclusive of separately billable procedures and treating other patients.  Critical care was necessary to treat or prevent imminent or life-threatening deterioration.  Critical care was time spent personally by me on the following activities: development of treatment plan with patient and/or surrogate as well as nursing, discussions with consultants, evaluation of patient's response to treatment, examination of patient, obtaining history  from patient or surrogate, ordering and performing treatments and interventions, ordering and review of laboratory studies, ordering and review of radiographic studies, pulse oximetry and re-evaluation of patient's condition.    Kinley Dozier, Layla Maw, DO 08/17/19 0130

## 2019-08-16 NOTE — ED Notes (Signed)
Other lactic acid is still in process

## 2019-08-16 NOTE — ED Provider Notes (Signed)
[facility] EMERGENCY DEPARTMENT Provider Note   CSN: [id number] Arrival date & time: 08/16/19  2202     History Chief Complaint  Patient presents with  . Alcohol Intoxication  . Altered Mental Status    Robert Frank is a 54 y.o. male.  The history is provided by the patient and medical records. No language interpreter was used.  Alcohol Intoxication  Altered Mental Status  Robert Frank is a 54 y.o. male who presents to the Emergency Department complaining of AMS. Level V caveat due to confusion. History is provided by EMS. Per report he comes the emergency department after being found down in the driveway of a local business. Patient does not know why he is in the emergency department. He denies any acute complaints. He does report that he drinks a 40 ounce of alcohol daily and did drink today. He states that he has a chronic rash on his abdomen that is itchy at times but on not painful. He lives at home with his brother. No known sick contacts. He does have a history of prior stroke. Does not take any prescription medications.    Past Medical History:  Diagnosis Date  . Alcoholism (HCC)   . Blind left eye   . CVA (cerebral vascular accident) (HCC)    12 years ago- residual R sided numbness    Patient Active Problem List   Diagnosis Date Noted  . COPD exacerbation (HCC) 10/07/2018  . COPD (chronic obstructive pulmonary disease) (HCC) 10/06/2018  . COPD with acute exacerbation (HCC) 10/06/2018    Past Surgical History:  Procedure Laterality Date  . ANTERIOR CRUCIATE LIGAMENT REPAIR         No family history on file.  Social History   Tobacco Use  . Smoking status: Current Every Day Smoker    Packs/day: 1.00  . Smokeless tobacco: Never Used  Substance Use Topics  . Alcohol use: Yes    Comment: 5 40 oz/day  . Drug use: Not Currently    Home Medications Prior to Admission medications   Medication Sig Start Date End Date Taking?  Authorizing Provider  albuterol (VENTOLIN HFA) 108 (90 Base) MCG/ACT inhaler Inhale 2 puffs into the lungs every 6 (six) hours as needed for wheezing or shortness of breath. 10/07/18   [name], Carley D, DO  amLODipine (NORVASC) 5 MG tablet Take 1 tablet (5 mg total) by mouth daily. 10/08/18   [name], Carley D, DO  aspirin EC 81 MG tablet Take 1 tablet (81 mg total) by mouth daily. 10/07/18 10/07/19  [name], Carley D, DO  azithromycin (ZITHROMAX) 250 MG tablet Take 2 tablets daily for 3 days. 10/08/18   [name], Karma Ganja D, DO    Allergies    Patient has no known allergies.  Review of Systems   Review of Systems  All other systems reviewed and are negative.   Physical Exam Updated Vital Signs BP (!) 152/89   Pulse (!) 120   Temp (!) 101 F (38.3 C) (Rectal)   Resp 19   Ht 5\' 8"  (1.727 m)   Wt 97.5 kg   SpO2 94%   BMI 32.69 kg/m   Physical Exam Vitals and nursing note reviewed.  Constitutional:      Appearance: He is well-developed.  HENT:     Head: Normocephalic and atraumatic.  Cardiovascular:     Rate and Rhythm: Regular rhythm. Tachycardia present.     Heart sounds: No murmur.  Pulmonary:  Effort: Pulmonary effort is normal. No respiratory distress.     Breath sounds: Normal breath sounds.  Abdominal:     Palpations: Abdomen is soft.     Tenderness: There is no abdominal tenderness. There is no guarding or rebound.     Comments: There are excoriations and skin breakdown in the lower abdominal and inner nutritionist area without significant erythema.  Musculoskeletal:        General: No tenderness.     Comments: 2+ DP pulses bilaterally. There is thickening and callouses of the skin to the souls of bilateral feet. There is mild erythema to bilateral distal lower extremities.  Skin:    General: Skin is warm and dry.  Neurological:     Mental Status: He is alert and oriented to person, place, and time.     Comments: 5/5 strength in all four extremities    Psychiatric:        Behavior: Behavior normal.     ED Results / Procedures / Treatments   Labs (all labs ordered are listed, but only abnormal results are displayed) Labs Reviewed  I-STAT CHEM 8, ED - Abnormal; Notable for the following components:      Result Value   Potassium 3.4 (*)    BUN 4 (*)    Glucose, Bld 155 (*)    Calcium, Ion 1.03 (*)    All other components within normal limits  CULTURE, BLOOD (ROUTINE X 2)  CULTURE, BLOOD (ROUTINE X 2)  URINE CULTURE  SARS CORONAVIRUS 2 (TAT 6-24 HRS)  APTT  PROTIME-INR  ETHANOL  LACTIC ACID, PLASMA  LACTIC ACID, PLASMA  COMPREHENSIVE METABOLIC PANEL  CBC WITH DIFFERENTIAL/PLATELET  URINALYSIS, ROUTINE W REFLEX MICROSCOPIC  POC SARS CORONAVIRUS 2 AG -  ED    EKG EKG Interpretation  Date/Time:  Tuesday August 16 2019 22:51:07 EST Ventricular Rate:  119 PR Interval:    QRS Duration: 85 QT Interval:  335 QTC Calculation: 472 R Axis:   48 Text Interpretation: Sinus tachycardia Confirmed by Tilden Fossa [phone]) on 08/16/2019 11:05:47 PM   Radiology CT Head Wo Contrast  Result Date: 08/16/2019 CLINICAL DATA:  Altered mental status, found unresponsive EXAM: CT HEAD WITHOUT CONTRAST TECHNIQUE: Contiguous axial images were obtained from the base of the skull through the vertex without intravenous contrast. COMPARISON:  11/12/2017 FINDINGS: Brain: Mild atrophic changes and chronic white matter ischemic changes are seen. No acute hemorrhage is seen. No acute infarct is seen. Lacunar infarcts are noted in the thalami and basal ganglia bilaterally. Vascular: No hyperdense vessel or unexpected calcification. Skull: Normal. Negative for fracture or focal lesion. Sinuses/Orbits: No acute finding. Other: None. IMPRESSION: Chronic atrophic and ischemic changes without acute abnormality. Electronically Signed   By: Alcide Clever M.D.   On: 08/16/2019 23:43   DG Chest Port 1 View  Result Date: 08/16/2019 CLINICAL DATA:  Altered mental  status. EXAM: PORTABLE CHEST 1 VIEW COMPARISON:  November 22, 2017. FINDINGS: The cardiomediastinal silhouette is normal. Lung inflation is shallow and probably results in the bronchopulmonary vascular congestion. There is no interstitial edema, pneumothorax or obvious pleural effusion. Streaky bilateral cardiophrenic angle consolidation is likely atelectatic. Aortic knob calcified atherosclerosis, moderate skeletal degenerative changes, and telemetry leads are present. IMPRESSION: Shallow inspiration without pneumonia or interstitial edema. Aortic calcified atherosclerosis. Electronically Signed   By: Laurence Ferrari   On: 08/16/2019 22:57    Procedures Procedures (including critical care time)  Medications Ordered in ED Medications  sodium chloride 0.9 % bolus 1,000 mL (  0 mLs Intravenous Stopped 08/16/19 2344)  thiamine (B-1) injection 100 mg (100 mg Intravenous Given 08/16/19 2248)  ceFAZolin (ANCEF) IVPB 1 g/50 mL premix (0 g Intravenous Stopped 08/16/19 2320)  acetaminophen (TYLENOL) tablet 650 mg (650 mg Oral Given 08/16/19 2356)    ED Course  I have reviewed the triage vital signs and the nursing notes.  Pertinent labs & imaging results that were available during my care of the patient were reviewed by me and considered in my medical decision making (see chart for details).    MDM Rules/Calculators/A&P                     Patient with history of alcohol abuse here for evaluation after being found down. He is noted to be febrile on ED arrival, patient unaware of the fever. He is ill appearing on evaluation with wound to his lower abdominal wall as well as his feet. He was treated with IV fluids, antibiotics for possible early cellulitis pending further workup. Patient care transferred pending labs.  Final Clinical Impression(s) / ED Diagnoses Final diagnoses:  None    Rx / DC Orders ED Discharge Orders    None       Quintella Reichert, MD 08/16/19 2357

## 2019-08-17 ENCOUNTER — Encounter (HOSPITAL_COMMUNITY): Payer: Self-pay | Admitting: Family Medicine

## 2019-08-17 ENCOUNTER — Inpatient Hospital Stay (HOSPITAL_COMMUNITY): Payer: Self-pay

## 2019-08-17 DIAGNOSIS — F1020 Alcohol dependence, uncomplicated: Secondary | ICD-10-CM

## 2019-08-17 DIAGNOSIS — F10239 Alcohol dependence with withdrawal, unspecified: Secondary | ICD-10-CM | POA: Diagnosis present

## 2019-08-17 DIAGNOSIS — J449 Chronic obstructive pulmonary disease, unspecified: Secondary | ICD-10-CM

## 2019-08-17 DIAGNOSIS — A419 Sepsis, unspecified organism: Secondary | ICD-10-CM

## 2019-08-17 DIAGNOSIS — G9340 Encephalopathy, unspecified: Secondary | ICD-10-CM | POA: Diagnosis present

## 2019-08-17 DIAGNOSIS — F10939 Alcohol use, unspecified with withdrawal, unspecified: Secondary | ICD-10-CM | POA: Diagnosis present

## 2019-08-17 DIAGNOSIS — L0390 Cellulitis, unspecified: Secondary | ICD-10-CM

## 2019-08-17 LAB — COMPREHENSIVE METABOLIC PANEL
ALT: 37 U/L (ref 0–44)
ALT: 28 U/L (ref 0–44)
AST: 41 U/L (ref 15–41)
AST: 34 U/L (ref 15–41)
Albumin: 3.2 g/dL — ABNORMAL LOW (ref 3.5–5.0)
Albumin: 2.5 g/dL — ABNORMAL LOW (ref 3.5–5.0)
Alkaline Phosphatase: 91 U/L (ref 38–126)
Alkaline Phosphatase: 72 U/L (ref 38–126)
Anion gap: 16 — ABNORMAL HIGH (ref 5–15)
Anion gap: 12 (ref 5–15)
BUN: <5 mg/dL — ABNORMAL LOW (ref 6–20)
BUN: 6 mg/dL (ref 6–20)
CO2: 21 mmol/L — ABNORMAL LOW (ref 22–32)
CO2: 24 mmol/L (ref 22–32)
Calcium: 8.5 mg/dL — ABNORMAL LOW (ref 8.9–10.3)
Calcium: 7.8 mg/dL — ABNORMAL LOW (ref 8.9–10.3)
Chloride: 99 mmol/L (ref 98–111)
Chloride: 98 mmol/L (ref 98–111)
Creatinine, Ser: 0.95 mg/dL (ref 0.61–1.24)
Creatinine, Ser: 0.75 mg/dL (ref 0.61–1.24)
GFR calc Af Amer: >60 mL/min (ref >60)
GFR calc Af Amer: >60 mL/min (ref >60)
GFR calc non Af Amer: >60 mL/min (ref >60)
GFR calc non Af Amer: >60 mL/min (ref >60)
Glucose, Bld: 161 mg/dL — ABNORMAL HIGH (ref 70–99)
Glucose, Bld: 114 mg/dL — ABNORMAL HIGH (ref 70–99)
Potassium: 3.7 mmol/L (ref 3.5–5.1)
Potassium: 3.6 mmol/L (ref 3.5–5.1)
Sodium: 136 mmol/L (ref 135–145)
Sodium: 134 mmol/L — ABNORMAL LOW (ref 135–145)
Total Bilirubin: 1 mg/dL (ref 0.3–1.2)
Total Bilirubin: 1.2 mg/dL (ref 0.3–1.2)
Total Protein: 7.3 g/dL (ref 6.5–8.1)
Total Protein: 5.9 g/dL — ABNORMAL LOW (ref 6.5–8.1)

## 2019-08-17 LAB — CBC WITH DIFFERENTIAL/PLATELET
Abs Immature Granulocytes: 0.03 10*3/uL (ref 0.00–0.07)
Abs Immature Granulocytes: 0.14 10*3/uL — ABNORMAL HIGH (ref 0.00–0.07)
Basophils Absolute: 0.1 10*3/uL (ref 0.0–0.1)
Basophils Absolute: 0.1 10*3/uL (ref 0.0–0.1)
Basophils Relative: 1 %
Basophils Relative: 0 %
Eosinophils Absolute: 0 10*3/uL (ref 0.0–0.5)
Eosinophils Absolute: 0 10*3/uL (ref 0.0–0.5)
Eosinophils Relative: 0 %
Eosinophils Relative: 0 %
HCT: 47.2 % (ref 39.0–52.0)
HCT: 42.4 % (ref 39.0–52.0)
Hemoglobin: 15.9 g/dL (ref 13.0–17.0)
Hemoglobin: 14 g/dL (ref 13.0–17.0)
Immature Granulocytes: 0 %
Immature Granulocytes: 1 %
Lymphocytes Relative: 5 %
Lymphocytes Relative: 5 %
Lymphs Abs: 0.4 10*3/uL — ABNORMAL LOW (ref 0.7–4.0)
Lymphs Abs: 0.6 10*3/uL — ABNORMAL LOW (ref 0.7–4.0)
MCH: 36 pg — ABNORMAL HIGH (ref 26.0–34.0)
MCH: 35.3 pg — ABNORMAL HIGH (ref 26.0–34.0)
MCHC: 33.7 g/dL (ref 30.0–36.0)
MCHC: 33 g/dL (ref 30.0–36.0)
MCV: 106.8 fL — ABNORMAL HIGH (ref 80.0–100.0)
MCV: 106.8 fL — ABNORMAL HIGH (ref 80.0–100.0)
Monocytes Absolute: 0.9 10*3/uL (ref 0.1–1.0)
Monocytes Absolute: 1.5 10*3/uL — ABNORMAL HIGH (ref 0.1–1.0)
Monocytes Relative: 10 %
Monocytes Relative: 12 %
Neutro Abs: 7.9 10*3/uL — ABNORMAL HIGH (ref 1.7–7.7)
Neutro Abs: 10.3 10*3/uL — ABNORMAL HIGH (ref 1.7–7.7)
Neutrophils Relative %: 84 %
Neutrophils Relative %: 82 %
Platelets: 203 10*3/uL (ref 150–400)
Platelets: 173 10*3/uL (ref 150–400)
RBC: 4.42 MIL/uL (ref 4.22–5.81)
RBC: 3.97 MIL/uL — ABNORMAL LOW (ref 4.22–5.81)
RDW: 12.4 % (ref 11.5–15.5)
RDW: 12.5 % (ref 11.5–15.5)
WBC: 9.3 10*3/uL (ref 4.0–10.5)
WBC: 12.5 10*3/uL — ABNORMAL HIGH (ref 4.0–10.5)
nRBC: 0 % (ref 0.0–0.2)
nRBC: 0 % (ref 0.0–0.2)

## 2019-08-17 LAB — URINALYSIS, ROUTINE W REFLEX MICROSCOPIC
Bacteria, UA: NONE SEEN
Bilirubin Urine: NEGATIVE
Glucose, UA: NEGATIVE mg/dL
Hgb urine dipstick: NEGATIVE
Ketones, ur: NEGATIVE mg/dL
Leukocytes,Ua: NEGATIVE
Nitrite: NEGATIVE
Protein, ur: 30 mg/dL — AB
Specific Gravity, Urine: 1.011 (ref 1.005–1.030)
pH: 6 (ref 5.0–8.0)

## 2019-08-17 LAB — SARS CORONAVIRUS 2 (TAT 6-24 HRS): SARS Coronavirus 2: NEGATIVE

## 2019-08-17 LAB — MAGNESIUM: Magnesium: 1.8 mg/dL (ref 1.7–2.4)

## 2019-08-17 LAB — LACTIC ACID, PLASMA
Lactic Acid, Venous: 1.8 mmol/L (ref 0.5–1.9)
Lactic Acid, Venous: 2.2 mmol/L (ref 0.5–1.9)
Lactic Acid, Venous: 7.2 mmol/L (ref 0.5–1.9)

## 2019-08-17 LAB — PHOSPHORUS: Phosphorus: 3 mg/dL (ref 2.5–4.6)

## 2019-08-17 MED ORDER — SODIUM CHLORIDE 0.9 % IV SOLN
2.0000 g | Freq: Three times a day (TID) | INTRAVENOUS | Status: DC
  Administered 2019-08-17 – 2019-08-18 (×2): 2 g via INTRAVENOUS
  Filled 2019-08-17 (×5): qty 2

## 2019-08-17 MED ORDER — LACTATED RINGERS IV BOLUS
1000.0000 mL | Freq: Once | INTRAVENOUS | Status: AC
  Administered 2019-08-17: 1000 mL via INTRAVENOUS

## 2019-08-17 MED ORDER — SODIUM CHLORIDE 0.9 % IV SOLN: INTRAVENOUS | Status: AC

## 2019-08-17 MED ORDER — LORAZEPAM 2 MG/ML IJ SOLN: 0.0000 mg | Freq: Three times a day (TID) | INTRAMUSCULAR | Status: DC

## 2019-08-17 MED ORDER — METRONIDAZOLE IN NACL 5-0.79 MG/ML-% IV SOLN
500.0000 mg | Freq: Once | INTRAVENOUS | Status: AC
  Administered 2019-08-17: 02:00:00 500 mg via INTRAVENOUS
  Filled 2019-08-17: qty 100

## 2019-08-17 MED ORDER — LORAZEPAM 1 MG PO TABS: 0.0000 mg | ORAL_TABLET | Freq: Four times a day (QID) | ORAL | Status: DC

## 2019-08-17 MED ORDER — FAMOTIDINE IN NACL 20-0.9 MG/50ML-% IV SOLN: 20.0000 mg | Freq: Two times a day (BID) | INTRAVENOUS | Status: DC

## 2019-08-17 MED ORDER — THIAMINE HCL 100 MG PO TABS
100.0000 mg | ORAL_TABLET | Freq: Every day | ORAL | Status: DC
  Administered 2019-08-17: 10:00:00 100 mg via ORAL
  Filled 2019-08-17: qty 1

## 2019-08-17 MED ORDER — ONDANSETRON HCL 4 MG PO TABS: 4.0000 mg | ORAL_TABLET | Freq: Four times a day (QID) | ORAL | Status: DC | PRN

## 2019-08-17 MED ORDER — SODIUM CHLORIDE 0.9 % IV SOLN
2.0000 g | Freq: Once | INTRAVENOUS | Status: AC
  Administered 2019-08-17: 01:00:00 2 g via INTRAVENOUS
  Filled 2019-08-17: qty 2

## 2019-08-17 MED ORDER — VANCOMYCIN HCL 2000 MG/400ML IV SOLN
2000.0000 mg | Freq: Once | INTRAVENOUS | Status: AC
  Administered 2019-08-17: 02:00:00 2000 mg via INTRAVENOUS
  Filled 2019-08-17: qty 400

## 2019-08-17 MED ORDER — MAGNESIUM SULFATE 2 GM/50ML IV SOLN
2.0000 g | Freq: Once | INTRAVENOUS | Status: AC
  Administered 2019-08-17: 15:00:00 2 g via INTRAVENOUS
  Filled 2019-08-17: qty 50

## 2019-08-17 MED ORDER — FOLIC ACID 1 MG PO TABS
1.0000 mg | ORAL_TABLET | Freq: Every day | ORAL | Status: DC
  Administered 2019-08-17: 10:00:00 1 mg via ORAL
  Filled 2019-08-17: qty 1

## 2019-08-17 MED ORDER — LORAZEPAM 2 MG/ML IJ SOLN
0.0000 mg | INTRAMUSCULAR | Status: DC
  Administered 2019-08-17 – 2019-08-18 (×2): 1 mg via INTRAVENOUS
  Filled 2019-08-17 (×2): qty 1

## 2019-08-17 MED ORDER — NICOTINE 21 MG/24HR TD PT24
21.0000 mg | MEDICATED_PATCH | Freq: Every day | TRANSDERMAL | Status: DC
  Administered 2019-08-17: 19:00:00 21 mg via TRANSDERMAL
  Filled 2019-08-17: qty 1

## 2019-08-17 MED ORDER — LACTATED RINGERS IV BOLUS (SEPSIS)
1000.0000 mL | Freq: Once | INTRAVENOUS | Status: AC
  Administered 2019-08-17: 1000 mL via INTRAVENOUS

## 2019-08-17 MED ORDER — VANCOMYCIN HCL 750 MG/150ML IV SOLN
750.0000 mg | Freq: Three times a day (TID) | INTRAVENOUS | Status: DC
  Administered 2019-08-17 – 2019-08-18 (×2): 750 mg via INTRAVENOUS
  Filled 2019-08-17 (×5): qty 150

## 2019-08-17 MED ORDER — AMLODIPINE BESYLATE 5 MG PO TABS
5.0000 mg | ORAL_TABLET | Freq: Every day | ORAL | Status: DC
  Administered 2019-08-17: 15:00:00 5 mg via ORAL
  Filled 2019-08-17: qty 1

## 2019-08-17 MED ORDER — UMECLIDINIUM BROMIDE 62.5 MCG/INH IN AEPB
1.0000 | INHALATION_SPRAY | Freq: Every day | RESPIRATORY_TRACT | Status: DC
  Filled 2019-08-17: qty 7

## 2019-08-17 MED ORDER — LORAZEPAM 2 MG/ML IJ SOLN: 0.0000 mg | Freq: Two times a day (BID) | INTRAMUSCULAR | Status: DC

## 2019-08-17 MED ORDER — SENNOSIDES-DOCUSATE SODIUM 8.6-50 MG PO TABS: 1.0000 | ORAL_TABLET | Freq: Every evening | ORAL | Status: DC | PRN

## 2019-08-17 MED ORDER — METRONIDAZOLE IN NACL 5-0.79 MG/ML-% IV SOLN
500.0000 mg | Freq: Three times a day (TID) | INTRAVENOUS | Status: DC
  Administered 2019-08-17 – 2019-08-18 (×2): 500 mg via INTRAVENOUS
  Filled 2019-08-17 (×2): qty 100

## 2019-08-17 MED ORDER — LORAZEPAM 1 MG PO TABS: 0.0000 mg | ORAL_TABLET | Freq: Two times a day (BID) | ORAL | Status: DC

## 2019-08-17 MED ORDER — VANCOMYCIN HCL IN DEXTROSE 1-5 GM/200ML-% IV SOLN: 1000.0000 mg | Freq: Once | INTRAVENOUS | Status: DC

## 2019-08-17 MED ORDER — ONDANSETRON HCL 4 MG/2ML IJ SOLN: 4.0000 mg | Freq: Four times a day (QID) | INTRAMUSCULAR | Status: DC | PRN

## 2019-08-17 MED ORDER — LORAZEPAM 2 MG/ML IJ SOLN
0.0000 mg | Freq: Four times a day (QID) | INTRAMUSCULAR | Status: DC
  Administered 2019-08-17: 2 mg via INTRAVENOUS
  Filled 2019-08-17: qty 1

## 2019-08-17 MED ORDER — SODIUM CHLORIDE 0.9% FLUSH
3.0000 mL | Freq: Two times a day (BID) | INTRAVENOUS | Status: DC
  Administered 2019-08-17 – 2019-08-18 (×2): 3 mL via INTRAVENOUS

## 2019-08-17 MED ORDER — ENOXAPARIN SODIUM 40 MG/0.4ML ~~LOC~~ SOLN
40.0000 mg | SUBCUTANEOUS | Status: DC
  Administered 2019-08-17: 40 mg via SUBCUTANEOUS
  Filled 2019-08-17: qty 0.4

## 2019-08-17 MED ORDER — LORAZEPAM 1 MG PO TABS: 1.0000 mg | ORAL_TABLET | ORAL | Status: DC | PRN

## 2019-08-17 MED ORDER — ACETAMINOPHEN 325 MG PO TABS: 650.0000 mg | ORAL_TABLET | Freq: Four times a day (QID) | ORAL | Status: DC | PRN

## 2019-08-17 MED ORDER — LORAZEPAM 2 MG/ML IJ SOLN: 1.0000 mg | INTRAMUSCULAR | Status: DC | PRN

## 2019-08-17 MED ORDER — ALBUTEROL SULFATE (2.5 MG/3ML) 0.083% IN NEBU
2.5000 mg | INHALATION_SOLUTION | Freq: Four times a day (QID) | RESPIRATORY_TRACT | Status: DC | PRN
  Administered 2019-08-17: 2.5 mg via RESPIRATORY_TRACT
  Filled 2019-08-17: qty 3

## 2019-08-17 MED ORDER — ACETAMINOPHEN 650 MG RE SUPP: 650.0000 mg | Freq: Four times a day (QID) | RECTAL | Status: DC | PRN

## 2019-08-17 MED ORDER — THIAMINE HCL 100 MG/ML IJ SOLN: 100.0000 mg | Freq: Every day | INTRAMUSCULAR | Status: DC

## 2019-08-17 MED ORDER — FAMOTIDINE IN NACL 20-0.9 MG/50ML-% IV SOLN
20.0000 mg | Freq: Two times a day (BID) | INTRAVENOUS | Status: DC
  Administered 2019-08-17 – 2019-08-18 (×2): 20 mg via INTRAVENOUS
  Filled 2019-08-17 (×2): qty 50

## 2019-08-17 NOTE — ED Notes (Signed)
Patient slid up and repositioned in bed.  Bedding changed.  Fluids given as requested.  Patient denies any other needs at this time.  Patient also denies any more withdrawal symptoms.  Light tremor noted.

## 2019-08-17 NOTE — H&P (Signed)
History and Physical    [name] KDX:[id number] DOB: 07-09-1965 DOA: 08/16/2019  PCP: Patient, No Pcp Per   Patient coming from: Home   Chief Complaint: Found down, confused   HPI: Robert Frank is a 54 y.o. male with medical history significant for alcohol dependence and CVA, now presenting to the emergency department after he was found down and confused.  History is limited by the patient's clinical condition but he does relate that he had been having a flare in a chronic lower abdominal rash with weeping wounds, acknowledges drinking "a couple" 40 ounce beers daily, reports that he does not take any medications regularly, and is not sure what happened earlier that led to him lying on the ground.  He denies any recent chest pain, denies headache or neck stiffness, and denies shortness of breath or cough though he has a frequent cough throughout the interview and exam.  When asked about seizure history, he does report prior seizure.  ED Course: Upon arrival to the ED, patient is found to be febrile to 38.3 C, saturating mid 90s on room air, normal respiratory rate, tachycardic in the 120s, and modestly hypertensive.  EKG features sinus tachycardia with rate 119, chest x-ray notable for shallow inspiration without infiltrates, and head CT is negative for acute findings.  CBC notable for slight acidosis and elevation in anion gap.  CBC features of macrocytosis without anemia.  Urinalysis with 30 protein.  Covid antigen was negative, and ethanol level undetectable.  Initial lactic acid was 7.2, improving to 2.2 and less than 2 hours.  Blood cultures were collected in the ED, 2 L of lactated Ringer's were administered, and the patient was treated with acetaminophen, thiamine, cefepime, Flagyl, and vancomycin.  Covid PCR test is pending.  Review of Systems:  All other systems reviewed and apart from HPI, are negative.  Past Medical History:  Diagnosis Date  . Alcoholism ([place])   . Blind  left eye   . CVA (cerebral vascular accident) ([place])    12 years ago- residual R sided numbness    Past Surgical History:  Procedure Laterality Date  . ANTERIOR CRUCIATE LIGAMENT REPAIR       reports that he has been smoking. He has been smoking about 1.00 pack per day. He has never used smokeless tobacco. He reports current alcohol use. He reports previous drug use.  No Known Allergies  History reviewed. No pertinent family history.   Prior to Admission medications   Medication Sig Start Date End Date Taking? Authorizing Provider  albuterol (VENTOLIN HFA) 108 (90 Base) MCG/ACT inhaler Inhale 2 puffs into the lungs every 6 (six) hours as needed for wheezing or shortness of breath. 10/07/18   [name], Carley D, DO  amLODipine (NORVASC) 5 MG tablet Take 1 tablet (5 mg total) by mouth daily. 10/08/18   [name], Carley D, DO  aspirin EC 81 MG tablet Take 1 tablet (81 mg total) by mouth daily. 10/07/18 10/07/19  [name], Carley D, DO  azithromycin (ZITHROMAX) 250 MG tablet Take 2 tablets daily for 3 days. 10/08/18   Modena Nunnery D, DO    Physical Exam: Vitals:   08/16/19 2300 08/17/19 0000 08/17/19 0030 08/17/19 0130  BP: (!) 152/89 (!) 157/115 (!) 156/96 (!) 161/100  Pulse: (!) 120 (!) 120 (!) 122 (!) 122  Resp: 19 (!) 22 20 17   Temp:      TempSrc:      SpO2: 94% 96% 97% 92%  Weight:  Height:        Constitutional: NAD, sleeping, wakes to voice  Eyes: PERTLA, lids and conjunctivae normal ENMT: Mucous membranes are moist. Posterior pharynx clear of any exudate or lesions.   Neck: normal, supple, no masses, no thyromegaly Respiratory: frequent cough, no wheezing, no crackles. No accessory muscle use.  Cardiovascular: Rate ~120 and regular. No extremity edema.   Abdomen: No distension, no tenderness, soft. Bowel sounds active.  Musculoskeletal: no clubbing / cyanosis. No joint deformity upper and lower extremities.   Skin: Erythema involving crease beneath pannus  with maceration, skin breakdown, and heat. Erythema and wounds involving b/l feet. Hyperpigmentation lower legs, lt > rt.  Neurologic: No gross facial asymmetry. Sensation intact. Moving all extremities. No meningismus.   Psychiatric: Sleeping, wakes to voice. Oriented to person and hospital only. Cooperative.    Labs and Imaging on Admission: I have personally reviewed following labs and imaging studies  CBC: Recent Labs  Lab 08/16/19 2221 08/16/19 2242  WBC 9.3  --   NEUTROABS 7.9*  --   HGB 15.9 16.0  HCT 47.2 47.0  MCV 106.8*  --   PLT 203  --    Basic Metabolic Panel: Recent Labs  Lab 08/16/19 2221 08/16/19 2242  NA 136 135  K 3.7 3.4*  CL 99 98  CO2 21*  --   GLUCOSE 161* 155*  BUN <5* 4*  CREATININE 0.95 0.80  CALCIUM 8.5*  --    GFR: Estimated Creatinine Clearance: 123.6 mL/min (by C-G formula based on SCr of 0.8 mg/dL). Liver Function Tests: Recent Labs  Lab 08/16/19 2221  AST 41  ALT 37  ALKPHOS 91  BILITOT 1.0  PROT 7.3  ALBUMIN 3.2*   No results for input(s): LIPASE, AMYLASE in the last 168 hours. No results for input(s): AMMONIA in the last 168 hours. Coagulation Profile: Recent Labs  Lab 08/16/19 2221  INR 1.0   Cardiac Enzymes: No results for input(s): CKTOTAL, CKMB, CKMBINDEX, TROPONINI in the last 168 hours. BNP (last 3 results) No results for input(s): PROBNP in the last 8760 hours. HbA1C: No results for input(s): HGBA1C in the last 72 hours. CBG: No results for input(s): GLUCAP in the last 168 hours. Lipid Profile: No results for input(s): CHOL, HDL, LDLCALC, TRIG, CHOLHDL, LDLDIRECT in the last 72 hours. Thyroid Function Tests: No results for input(s): TSH, T4TOTAL, FREET4, T3FREE, THYROIDAB in the last 72 hours. Anemia Panel: No results for input(s): VITAMINB12, FOLATE, FERRITIN, TIBC, IRON, RETICCTPCT in the last 72 hours. Urine analysis:    Component Value Date/Time   COLORURINE YELLOW 08/17/2019 0001   APPEARANCEUR CLEAR  08/17/2019 0001   LABSPEC 1.011 08/17/2019 0001   PHURINE 6.0 08/17/2019 0001   GLUCOSEU NEGATIVE 08/17/2019 0001   HGBUR NEGATIVE 08/17/2019 0001   BILIRUBINUR NEGATIVE 08/17/2019 0001   KETONESUR NEGATIVE 08/17/2019 0001   PROTEINUR 30 (A) 08/17/2019 0001   NITRITE NEGATIVE 08/17/2019 0001   LEUKOCYTESUR NEGATIVE 08/17/2019 0001   Sepsis Labs: @LABRCNTIP (procalcitonin:4,lacticidven:4) )No results found for this or any previous visit (from the past 240 hour(s)).   Radiological Exams on Admission: CT Head Wo Contrast  Result Date: 08/16/2019 CLINICAL DATA:  Altered mental status, found unresponsive EXAM: CT HEAD WITHOUT CONTRAST TECHNIQUE: Contiguous axial images were obtained from the base of the skull through the vertex without intravenous contrast. COMPARISON:  11/12/2017 FINDINGS: Brain: Mild atrophic changes and chronic white matter ischemic changes are seen. No acute hemorrhage is seen. No acute infarct is seen. Lacunar infarcts are  noted in the thalami and basal ganglia bilaterally. Vascular: No hyperdense vessel or unexpected calcification. Skull: Normal. Negative for fracture or focal lesion. Sinuses/Orbits: No acute finding. Other: None. IMPRESSION: Chronic atrophic and ischemic changes without acute abnormality. Electronically Signed   By: Alcide Clever M.D.   On: 08/16/2019 23:43   DG Chest Port 1 View  Result Date: 08/16/2019 CLINICAL DATA:  Altered mental status. EXAM: PORTABLE CHEST 1 VIEW COMPARISON:  November 22, 2017. FINDINGS: The cardiomediastinal silhouette is normal. Lung inflation is shallow and probably results in the bronchopulmonary vascular congestion. There is no interstitial edema, pneumothorax or obvious pleural effusion. Streaky bilateral cardiophrenic angle consolidation is likely atelectatic. Aortic knob calcified atherosclerosis, moderate skeletal degenerative changes, and telemetry leads are present. IMPRESSION: Shallow inspiration without pneumonia or interstitial  edema. Aortic calcified atherosclerosis. Electronically Signed   By: Laurence Ferrari   On: 08/16/2019 22:57    EKG: Independently reviewed. Sinus tachycardia (rate 119).   Assessment/Plan   1. Sepsis  - Presents with confusion after being found on the ground outside, is noted to be febrile and tachycardic with initial lactate 7.2, coughing but without hypoxia or infiltrate on CXR, without meningismus, with soft non-tender abdomen, and severe intertrigo beneath pannus with maceration and suspected secondary cellulitis  - Blood and urine cultures were collected in ED, he was fluid-resuscitated with LR, and broad-spectrum empiric antibiotics were started  - Continue LR to complete 30 cc/kg bolus, continue broad spectrum antibiotics, follow-up COVID pcr, and follow cultures and clinical course    2. Acute encephalopathy  - Presents after being found on the ground and confused  - He is oriented to person and place only in ED, has no acute findings on head CT, has fever but no meningismus, and suspected to be in severe alcohol withdrawal and possibly post-ictal related to that  - Continue to manage suspected alcohol withdrawal, continue to treat possible sepsis, continue thiamine    3. Alcohol dependence  - Patient acknowledges long history of excessive daily drinking, is suspected to be in severe withdrawal, will be monitored with CIWA, treated with Ativan, and vitamins will be supplemented    4. COPD  - Frequent cough in ED, no wheeze  - Use albuterol as needed    DVT prophylaxis: Lovenox  Code Status: Full  Family Communication: Discussed with patient  Disposition Plan: Likely back home pending improved mental status  Consults called: None  Admission status: Observation     Briscoe Deutscher, MD [facility] Pager: See [email or web address]  If 7AM-7PM, please contact the daytime attending [email or web address]  08/17/2019, 2:01 AM

## 2019-08-17 NOTE — ED Notes (Signed)
CMP recollect obtained and sent to lab.  Venipuncture to right hand

## 2019-08-17 NOTE — ED Notes (Signed)
This RN into room.  Patient continues to rest quietly with eyes closed.  NAD.  HR still around 120 but patient continues to show no signs of withdrawal.  CIWA completed again.  See documentation

## 2019-08-17 NOTE — Progress Notes (Signed)
Pharmacy Antibiotic Note  Robert Frank is a 54 y.o. male admitted on 08/16/2019 with sepsis.  Pharmacy has been consulted for cefepime and vancomycin dosing.  Plan: Cefepime 2gm IV q8 hours Vancomycin 2gm IV x 1 then 750 mg IV q8 hours F/u renal function, cultures and clincial course  Height: 5\' 8"  (172.7 cm) Weight: 215 lb (97.5 kg) IBW/kg (Calculated) : 68.4  Temp (24hrs), Avg:100.2 F (37.9 C), Min:99.3 F (37.4 C), Max:101 F (38.3 C)  Recent Labs  Lab 08/16/19 2221 08/16/19 2242 08/17/19 0001  WBC 9.3  --   --   CREATININE 0.95 0.80  --   LATICACIDVEN 7.2*  --  2.2*    Estimated Creatinine Clearance: 123.6 mL/min (by C-G formula based on SCr of 0.8 mg/dL).    No Known Allergies   Thank you for allowing pharmacy to be a part of this patient's care.  10/17/19 [place] 08/17/2019 1:43 AM

## 2019-08-17 NOTE — Progress Notes (Signed)
PROGRESS NOTE        PATIENT DETAILS Name: Robert Frank Age: 54 y.o. Sex: male Date of Birth: [birth year] Admit Date: 08/16/2019 Admitting Physician Vianne Bulls, MD NUU:[id number], No Pcp Per  Brief Narrative: Patient is a 54 y.o. male with history of alcohol use, CVA, HTN, COPD brought to the ED confused-thought to have acute metabolic encephalopathy in the setting of sepsis and alcohol use.  See below for further details.  Significant events: 3/10>> admit to [facility]  Antimicrobial therapy: Vancomycin: 3/9>> Cefepime: 3/9>> Flagyl: 3/9>>  Microbiology data: 3/9>>Blood cultures: Negative 3/9: POC Covid antigen negative 3/10: Covid PCR negative  Procedures : None  Consults: None  DVT Prophylaxis : Prophylactic Lovenox  Subjective: Awake-alert this morning-answers questions appropriately.  Follows all commands.  Denies headache.  Last drink was on 3/9-apparently drinks a couple of 40 ounces of beer cans on a daily basis.  Assessment/Plan: Sepsis secondary to abdominal wall cellulitis/panniculitis: Sepsis physiology improved-afebrile-completely awake and alert this morning.  Continue vancomycin and cefepime for another 24 hours-suspect we could discontinue Flagyl.  Follow cultures.  Appreciate wound care eval.  Acute metabolic encephalopathy: Suspect encephalopathy related to alcohol use, sepsis.  He is currently completely awake and alert.  Denies any headache.  CT head negative for acute abnormalities.  EtOH abuse: Mildly tremulous-last drink on 3/9-watch closely on Ativan per CIWA protocol.  COPD: Some scattered wheezing-start bronchodilators and follow.  HTN: BP creeping up-resume amlodipine.  Follow and adjust  Obesity: Estimated body mass index is 32.69 kg/m as calculated from the following:   Height as of this encounter: 5\' 8"  (1.727 m).   Weight as of this encounter: 97.5 kg.    Diet: Diet Order    None       Code  Status: Full code  Family Communication: Unable to leave voicemail for person listed in facesheet.  Disposition Plan: Home  when ready for discharge  Barriers to Discharge: Sepsis physiology secondary to abdominal wall infection requiring IV antibiotics.   Antimicrobial agents: Anti-infectives (From admission, onward)   Start     Dose/Rate Route Frequency Ordered Stop   08/17/19 1000  vancomycin (VANCOREADY) IVPB 750 mg/150 mL     750 mg 150 mL/hr over 60 Minutes Intravenous Every 8 hours 08/17/19 0148     08/17/19 1000  metroNIDAZOLE (FLAGYL) IVPB 500 mg     500 mg 100 mL/hr over 60 Minutes Intravenous Every 8 hours 08/17/19 0155     08/17/19 0900  ceFEPIme (MAXIPIME) 2 g in sodium chloride 0.9 % 100 mL IVPB     2 g 200 mL/hr over 30 Minutes Intravenous Every 8 hours 08/17/19 0148     08/17/19 0045  ceFEPIme (MAXIPIME) 2 g in sodium chloride 0.9 % 100 mL IVPB     2 g 200 mL/hr over 30 Minutes Intravenous  Once 08/17/19 0036 08/17/19 0116   08/17/19 0045  metroNIDAZOLE (FLAGYL) IVPB 500 mg     500 mg 100 mL/hr over 60 Minutes Intravenous  Once 08/17/19 0036 08/17/19 0231   08/17/19 0045  vancomycin (VANCOCIN) IVPB 1000 mg/200 mL premix  Status:  Discontinued     1,000 mg 200 mL/hr over 60 Minutes Intravenous  Once 08/17/19 0036 08/17/19 0038   08/17/19 0045  vancomycin (VANCOREADY) IVPB 2000 mg/400 mL     2,000 mg 200 mL/hr over 120  Minutes Intravenous  Once 08/17/19 0038 08/17/19 0423   08/16/19 2300  ceFAZolin (ANCEF) IVPB 1 g/50 mL premix     1 g 100 mL/hr over 30 Minutes Intravenous  Once 08/16/19 2245 08/16/19 2320       Time spent: 25- minutes-Greater than 50% of this time was spent in counseling, explanation of diagnosis, planning of further management, and coordination of care.  MEDICATIONS: Scheduled Meds: . enoxaparin (LOVENOX) injection  40 mg Subcutaneous Q24H  . folic acid  1 mg Oral Daily  . LORazepam  0-4 mg Intravenous Q4H   Followed by  . [START  ON 08/19/2019] LORazepam  0-4 mg Intravenous Q8H  . sodium chloride flush  3 mL Intravenous Q12H  . thiamine  100 mg Oral Daily   Or  . thiamine  100 mg Intravenous Daily   Continuous Infusions: . sodium chloride 75 mL/hr at 08/17/19 0224  . ceFEPime (MAXIPIME) IV Stopped (08/17/19 1008)  . famotidine (PEPCID) IV Stopped (08/17/19 1025)  . metronidazole 500 mg (08/17/19 1010)  . vancomycin 750 mg (08/17/19 1025)   PRN Meds:.acetaminophen **OR** acetaminophen, albuterol, LORazepam **OR** LORazepam, ondansetron **OR** ondansetron (ZOFRAN) IV, senna-docusate   PHYSICAL EXAM: Vital signs: Vitals:   08/17/19 0800 08/17/19 0945 08/17/19 1015 08/17/19 1026  BP: 123/83 (!) 164/97 (!) 153/98 (!) 153/98  Pulse: (!) 117 (!) 105 (!) 106 (!) 107  Resp: 15 19 17    Temp:      TempSrc:      SpO2: 94% 99% 97%   Weight:      Height:       Filed Weights   08/16/19 2203  Weight: 97.5 kg   Body mass index is 32.69 kg/m.   Gen Exam:Alert awake-not in any distress.  Looks very disheveled. HEENT:atraumatic, normocephalic Chest: B/L clear to auscultation anteriorly-some scattered rhonchi CVS:S1S2 regular Abdomen:soft non tender, non distended-see pic below Extremities:no edema Neurology: Non focal Skin: no rash        I have personally reviewed following labs and imaging studies  LABORATORY DATA: CBC: Recent Labs  Lab 08/16/19 2221 08/16/19 2242 08/17/19 0456  WBC 9.3  --  12.5*  NEUTROABS 7.9*  --  10.3*  HGB 15.9 16.0 14.0  HCT 47.2 47.0 42.4  MCV 106.8*  --  106.8*  PLT 203  --  173    Basic Metabolic Panel: Recent Labs  Lab 08/16/19 2221 08/16/19 2242 08/17/19 0636  NA 136 135 134*  K 3.7 3.4* 3.6  CL 99 98 98  CO2 21*  --  24  GLUCOSE 161* 155* 114*  BUN <5* 4* 6  CREATININE 0.95 0.80 0.75  CALCIUM 8.5*  --  7.8*  MG  --   --  1.8  PHOS  --   --  3.0    GFR: Estimated Creatinine Clearance: 123.6 mL/min (by C-G formula based on SCr of 0.75  mg/dL).  Liver Function Tests: Recent Labs  Lab 08/16/19 2221 08/17/19 0636  AST 41 34  ALT 37 28  ALKPHOS 91 72  BILITOT 1.0 1.2  PROT 7.3 5.9*  ALBUMIN 3.2* 2.5*   No results for input(s): LIPASE, AMYLASE in the last 168 hours. No results for input(s): AMMONIA in the last 168 hours.  Coagulation Profile: Recent Labs  Lab 08/16/19 2221  INR 1.0    Cardiac Enzymes: No results for input(s): CKTOTAL, CKMB, CKMBINDEX, TROPONINI in the last 168 hours.  BNP (last 3 results) No results for input(s): PROBNP in the last 8760  hours.  Lipid Profile: No results for input(s): CHOL, HDL, LDLCALC, TRIG, CHOLHDL, LDLDIRECT in the last 72 hours.  Thyroid Function Tests: No results for input(s): TSH, T4TOTAL, FREET4, T3FREE, THYROIDAB in the last 72 hours.  Anemia Panel: No results for input(s): VITAMINB12, FOLATE, FERRITIN, TIBC, IRON, RETICCTPCT in the last 72 hours.  Urine analysis:    Component Value Date/Time   COLORURINE YELLOW 08/17/2019 0001   APPEARANCEUR CLEAR 08/17/2019 0001   LABSPEC 1.011 08/17/2019 0001   PHURINE 6.0 08/17/2019 0001   GLUCOSEU NEGATIVE 08/17/2019 0001   HGBUR NEGATIVE 08/17/2019 0001   BILIRUBINUR NEGATIVE 08/17/2019 0001   KETONESUR NEGATIVE 08/17/2019 0001   PROTEINUR 30 (A) 08/17/2019 0001   NITRITE NEGATIVE 08/17/2019 0001   LEUKOCYTESUR NEGATIVE 08/17/2019 0001    Sepsis Labs: Lactic Acid, Venous    Component Value Date/Time   LATICACIDVEN 1.8 08/17/2019 0456    MICROBIOLOGY: Recent Results (from the past 240 hour(s))  Blood Culture (routine x 2)     Status: None (Preliminary result)   Collection Time: 08/16/19 10:28 PM   Specimen: BLOOD  Result Value Ref Range Status   Specimen Description BLOOD LEFT ANTECUBITAL  Final   Special Requests   Final    BOTTLES DRAWN AEROBIC AND ANAEROBIC Blood Culture results may not be optimal due to an inadequate volume of blood received in culture bottles   Culture   Final    NO GROWTH < 12  HOURS Performed at [facility], [address] [place], [place] [address]    Report Status PENDING  Incomplete  Blood Culture (routine x 2)     Status: None (Preliminary result)   Collection Time: 08/16/19 10:28 PM   Specimen: BLOOD RIGHT HAND  Result Value Ref Range Status   Specimen Description BLOOD RIGHT HAND  Final   Special Requests   Final    BOTTLES DRAWN AEROBIC AND ANAEROBIC Blood Culture adequate volume   Culture   Final    NO GROWTH < 12 HOURS Performed at [facility], [address] [place], [place] [address]    Report Status PENDING  Incomplete  SARS CORONAVIRUS 2 (TAT 6-24 HRS) Nasopharyngeal Nasopharyngeal Swab     Status: None   Collection Time: 08/17/19 12:01 AM   Specimen: Nasopharyngeal Swab  Result Value Ref Range Status   SARS Coronavirus 2 NEGATIVE NEGATIVE Final    Comment: (NOTE) SARS-CoV-2 target nucleic acids are NOT DETECTED. The SARS-CoV-2 RNA is generally detectable in upper and lower respiratory specimens during the acute phase of infection. Negative results do not preclude SARS-CoV-2 infection, do not rule out co-infections with other pathogens, and should not be used as the sole basis for treatment or other patient management decisions. Negative results must be combined with clinical observations, patient history, and epidemiological information. The expected result is Negative. Fact Sheet for Patients: HairSlick.no Fact Sheet for Healthcare Providers: quierodirigir.com This test is not yet approved or cleared by the [place] FDA and  has been authorized for detection and/or diagnosis of SARS-CoV-2 by FDA under an Emergency Use Authorization (EUA). This EUA will remain  in effect (meaning this test can be used) for the duration of the COVID-19 declaration under Section 56 4(b)(1) of the Act, 21 U.S.C. section 360bbb-3(b)(1), unless the authorization is terminated  or revoked sooner. Performed at [facility], [address] [place], [place] [address]     RADIOLOGY STUDIES/RESULTS: CT Head Wo Contrast  Result Date: 08/16/2019 CLINICAL DATA:  Altered mental status, found unresponsive EXAM: CT HEAD WITHOUT CONTRAST TECHNIQUE: Contiguous axial images were obtained from the base of the skull through the vertex without intravenous contrast. COMPARISON:  11/12/2017 FINDINGS: Brain: Mild atrophic changes and chronic white matter ischemic changes are seen. No acute hemorrhage is seen. No acute infarct is seen. Lacunar infarcts are noted in the thalami and basal ganglia bilaterally. Vascular: No hyperdense vessel or unexpected calcification. Skull: Normal. Negative for fracture or focal lesion. Sinuses/Orbits: No acute finding. Other: None. IMPRESSION: Chronic atrophic and ischemic changes without acute abnormality. Electronically Signed   By: Alcide Clever M.D.   On: 08/16/2019 23:43   DG Chest Port 1 View  Result Date: 08/16/2019 CLINICAL DATA:  Altered mental status. EXAM: PORTABLE CHEST 1 VIEW COMPARISON:  November 22, 2017. FINDINGS: The cardiomediastinal silhouette is normal. Lung inflation is shallow and probably results in the bronchopulmonary vascular congestion. There is no interstitial edema, pneumothorax or obvious pleural effusion. Streaky bilateral cardiophrenic angle consolidation is likely atelectatic. Aortic knob calcified atherosclerosis, moderate skeletal degenerative changes, and telemetry leads are present. IMPRESSION: Shallow inspiration without pneumonia or interstitial edema. Aortic calcified atherosclerosis. Electronically Signed   By: Laurence Ferrari   On: 08/16/2019 22:57     LOS: 0 days   Jeoffrey Massed, MD  [facility]    To contact the attending provider between 7A-7P or the covering provider during after hours 7P-7A, please log into the web site [email or web address] and access using universal [place] password for that web site.  If you do not have the password, please call the hospital operator.  08/17/2019, 11:01 AM

## 2019-08-17 NOTE — Consult Note (Signed)
WOC Nurse Consult Note: Patient receiving care in [facility] ED 28. Reason for Consult: bilateral feet, abdominal fold Wound type: Abrasion to dorsum of right foot, approximately 1 cm x 0.5 cm, superficial; periwound intact.  MASD-Intertriginous dermatitis to lower abdominal fold with grey drainage. Erythema and superficial scattered ulcerations present along fold. Pressure Injury POA: Yes/No/NA Measurement: Wound bed: Drainage (amount, consistency, odor)  Periwound: Dressing procedure/placement/frequency: InterDry for moisture management and antimicrobial properties to abdominal fold. Iodine to reduce microbes to right dorsal foot wound. Monitor the wound area(s) for worsening of condition such as: Signs/symptoms of infection,  Increase in size,  Development of or worsening of odor, Development of pain, or increased pain at the affected locations.  Notify the medical team if any of these develop.  Thank you for the consult.  Discussed plan of care with the patient and bedside nurse.  WOC nurse will not follow at this time.  Please re-consult the WOC team if needed.  Helmut Muster, RN, MSN, CWOCN, CNS-BC, pager [phone]

## 2019-08-17 NOTE — ED Notes (Signed)
Date and time results received: 08/17/19 00:30  Test: lactic acid Critical Value: 7.2   Name of Provider Notified: Ward   Orders Received? Or Actions Taken?: Orders Received - See Orders for details

## 2019-08-17 NOTE — ED Notes (Signed)
Condom cath replaced on patient

## 2019-08-17 NOTE — Progress Notes (Addendum)
Received report from Marisa Cyphers RN in ED at 1525. Patient arrived to unit 1545. A&O x4 on room air, no distress.    Paged Ghimire at [phone]. Pt. needs nicotine patch (2 PPD). Right 5th finger,lmtd movement weak, painful, says "broke" it 2 wks ago  Reports fall on 3/9 prior to ED admission.  MASD lower bilateral abdominal fold, scattered back and leg abrasions, lower leg and feet red, non pitting edema.  Spoke with pharm at 2008 and informed of need to adjust time of Cefipime and moved 1800 doses of Vanc and Flagyl to 2000.

## 2019-08-18 DIAGNOSIS — F10231 Alcohol dependence with withdrawal delirium: Secondary | ICD-10-CM

## 2019-08-18 LAB — CBC
HCT: 45.3 % (ref 39.0–52.0)
Hemoglobin: 15.5 g/dL (ref 13.0–17.0)
MCH: 36 pg — ABNORMAL HIGH (ref 26.0–34.0)
MCHC: 34.2 g/dL (ref 30.0–36.0)
MCV: 105.1 fL — ABNORMAL HIGH (ref 80.0–100.0)
Platelets: 127 10*3/uL — ABNORMAL LOW (ref 150–400)
RBC: 4.31 MIL/uL (ref 4.22–5.81)
RDW: 12.5 % (ref 11.5–15.5)
WBC: 10.3 10*3/uL (ref 4.0–10.5)
nRBC: 0 % (ref 0.0–0.2)

## 2019-08-18 LAB — COMPREHENSIVE METABOLIC PANEL
ALT: 39 U/L (ref 0–44)
AST: 59 U/L — ABNORMAL HIGH (ref 15–41)
Albumin: 3 g/dL — ABNORMAL LOW (ref 3.5–5.0)
Alkaline Phosphatase: 81 U/L (ref 38–126)
Anion gap: 12 (ref 5–15)
BUN: 5 mg/dL — ABNORMAL LOW (ref 6–20)
CO2: 25 mmol/L (ref 22–32)
Calcium: 8.5 mg/dL — ABNORMAL LOW (ref 8.9–10.3)
Chloride: 96 mmol/L — ABNORMAL LOW (ref 98–111)
Creatinine, Ser: 0.86 mg/dL (ref 0.61–1.24)
GFR calc Af Amer: >60 mL/min (ref >60)
GFR calc non Af Amer: >60 mL/min (ref >60)
Glucose, Bld: 99 mg/dL (ref 70–99)
Potassium: 3.5 mmol/L (ref 3.5–5.1)
Sodium: 133 mmol/L — ABNORMAL LOW (ref 135–145)
Total Bilirubin: 1.2 mg/dL (ref 0.3–1.2)
Total Protein: 7.2 g/dL (ref 6.5–8.1)

## 2019-08-18 LAB — MAGNESIUM: Magnesium: 2 mg/dL (ref 1.7–2.4)

## 2019-08-18 NOTE — Progress Notes (Signed)
PHARMACY - PHYSICIAN COMMUNICATION CRITICAL VALUE ALERT - BLOOD CULTURE IDENTIFICATION (BCID)  Robert Frank is an 54 y.o. male who presented to [facility] on 08/16/2019   Assessment:    3/9 Blood>>1/4 Gram + rods, no BCID   Name of physician (or Provider) Contacted: Donnamarie Poag ([facility])  Current antibiotics: Vancomycin/Cefepime/Flagyl   Changes to prescribed antibiotics recommended:  No changes for now  No results found for this or any previous visit.  [name] 08/18/2019  6:20 AM

## 2019-08-18 NOTE — Plan of Care (Signed)
  Problem: Education: Goal: Knowledge of General Education information will improve Description Including pain rating scale, medication(s)/side effects and non-pharmacologic comfort measures Outcome: Progressing   Problem: Health Behavior/Discharge Planning: Goal: Ability to manage health-related needs will improve Outcome: Progressing   

## 2019-08-18 NOTE — Discharge Summary (Signed)
PATIENT DETAILS Name: Robert Frank Age: 54 y.o. Sex: male Date of Birth: August 15, 1965 MRN: [id number]. Admitting Physician: Maretta Bees, MD [id number], No Pcp Per  Admit Date: 08/16/2019 Discharge date: 08/18/2019  Note:patient left AMA  Recommendations for Outpatient Follow-up:  1. Continue counseling regarding cessation of alcohol use 2. Please repeat CBC/BMET at next visit 3. Please follow blood/urine cultures till final  PRIMARY DISCHARGE DIAGNOSIS:  Principal Problem:   Alcohol withdrawal (HCC) Active Problems:   COPD (chronic obstructive pulmonary disease) (HCC)   Alcoholism (HCC)   Sepsis due to cellulitis (HCC)   Encephalopathy acute      PAST MEDICAL HISTORY: Past Medical History:  Diagnosis Date  . Alcoholism (HCC)   . Blind left eye   . CVA (cerebral vascular accident) (HCC)    12 years ago- residual R sided numbness    ALLERGIES:  No Known Allergies   Brief Narrative: Patient is a 54 y.o. male with history of alcohol use, CVA, HTN, COPD brought to the ED confused-thought to have acute metabolic encephalopathy in the setting of sepsis and alcohol use.  See below for further details.  Significant events: 3/10>> admit to [facility].  Antimicrobial therapy: Vancomycin: 3/9>> Cefepime: 3/9>> Flagyl: 3/9>>  Microbiology data: 3/9>>Blood cultures: Negative 3/9: POC Covid antigen negative 3/10: Covid PCR negative  Procedures : None  Consults: None  Brief Hospital course: Sepsis secondary to abdominal wall cellulitis/panniculitis: Sepsis physiology improved-afebrile-completely awake and alert.  He was treated with empiric vancomycin, cefepime and Flagyl.  Blood cultures were pending and-in fact 1 set was positive for gram-positive rod.  He was seen by wound care.  Plans were to follow clinical course and cultures.  Unfortunately he signed out AGAINST MEDICAL ADVICE on 3/11.    Acute metabolic encephalopathy: Suspect encephalopathy related  to alcohol use, sepsis.  He was currently completely awake and alert when seen by this MD last on 3/10.  Denies any headache.  CT head negative for acute abnormalities.  EtOH abuse: Mildly tremulous-last drink on 3/9-managed with Ativan per CIWA protocol.  COPD: Some scattered wheezing-and needed with bronchodilators   HTN: BP creeping up- amlodipine was resumed  Obesity: Estimated body mass index is 32.69 kg/m as calculated from the following:   Height as of this encounter: 5\' 8"  (1.727 m).   Weight as of this encounter: 97.5 kg.    PERTINENT RADIOLOGIC STUDIES: CT Head Wo Contrast  Result Date: 08/16/2019 CLINICAL DATA:  Altered mental status, found unresponsive EXAM: CT HEAD WITHOUT CONTRAST TECHNIQUE: Contiguous axial images were obtained from the base of the skull through the vertex without intravenous contrast. COMPARISON:  11/12/2017 FINDINGS: Brain: Mild atrophic changes and chronic white matter ischemic changes are seen. No acute hemorrhage is seen. No acute infarct is seen. Lacunar infarcts are noted in the thalami and basal ganglia bilaterally. Vascular: No hyperdense vessel or unexpected calcification. Skull: Normal. Negative for fracture or focal lesion. Sinuses/Orbits: No acute finding. Other: None. IMPRESSION: Chronic atrophic and ischemic changes without acute abnormality. Electronically Signed   By: 01/12/2018 M.D.   On: 08/16/2019 23:43   DG Hand 2 View Right  Result Date: 08/17/2019 CLINICAL DATA:  Fall at home. Right fifth finger pain. EXAM: RIGHT HAND - 2 VIEW COMPARISON:  None. FINDINGS: Acute complex fracture of the right fifth proximal phalanx proximal body and base with mild compaction deformity and minimal displaced. No apparent extension of fracture into articular surface. No dislocation or radiopaque foreign body. Soft tissue edema of  the right fifth finger, and the medial and lateral hand and wrist. An additional 6 x 4 mm calcific density with indistinct inferior  margin projecting on the first MCP joint, possibly a small cortical avulsion from the metacarpal head. An adjacent well corticated sesamoid ossicle is present in this region. Mild periarticular demineralization and small periarticular erosions of the second through fourth metacarpal heads. Beak-like osteophyte at the third metacarpal head neck junction. Mild joint space narrowing at the third MCP joint. 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 IMPRESSION: 1. Acute complex fracture of the right fifth proximal phalanx proximal body and base with mild compaction deformity and minimal displacement. 2. An additional 6 x 4 mm calcific density with indistinct inferior margin projecting on the first MCP joint, possibly a small cortical avulsion from the metacarpal head. 3. Soft tissue edema of the right hand and wrist. 4. Right ulnar negative variance. 5. 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 Electronically Signed   By: Revonda Humphrey   On: 08/17/2019 20:21   DG Chest Port 1 View  Result Date: 08/16/2019 CLINICAL DATA:  Altered mental status. EXAM: PORTABLE CHEST 1 VIEW COMPARISON:  November 22, 2017. FINDINGS: The cardiomediastinal silhouette is normal. Lung inflation is shallow and probably results in the bronchopulmonary vascular congestion. There is no interstitial edema, pneumothorax or obvious pleural effusion. Streaky bilateral cardiophrenic angle consolidation is likely atelectatic. Aortic knob calcified atherosclerosis, moderate skeletal degenerative changes, and telemetry leads are present. IMPRESSION: Shallow inspiration without pneumonia or interstitial edema. Aortic calcified atherosclerosis. Electronically Signed   By: Revonda Humphrey   On: 08/16/2019 22:57     PERTINENT LAB RESULTS: CBC: Recent Labs    08/17/19 0456 08/18/19 0533  WBC 12.5* 10.3  HGB 14.0 15.5  HCT 42.4 45.3  PLT 173 127*   CMET CMP     Component Value Date/Time   NA 133 (L)  08/18/2019 0533   K 3.5 08/18/2019 0533   CL 96 (L) 08/18/2019 0533   CO2 25 08/18/2019 0533   GLUCOSE 99 08/18/2019 0533   BUN 5 (L) 08/18/2019 0533   CREATININE 0.86 08/18/2019 0533   CALCIUM 8.5 (L) 08/18/2019 0533   PROT 7.2 08/18/2019 0533   ALBUMIN 3.0 (L) 08/18/2019 0533   AST 59 (H) 08/18/2019 0533   ALT 39 08/18/2019 0533   ALKPHOS 81 08/18/2019 0533   BILITOT 1.2 08/18/2019 0533   GFRNONAA >60 08/18/2019 0533   GFRAA >60 08/18/2019 0533    GFR Estimated Creatinine Clearance: 120.4 mL/min (by C-G formula based on SCr of 0.86 mg/dL). No results for input(s): LIPASE, AMYLASE in the last 72 hours. No results for input(s): CKTOTAL, CKMB, CKMBINDEX, TROPONINI in the last 72 hours. Invalid input(s): POCBNP No results for input(s): DDIMER in the last 72 hours. No results for input(s): HGBA1C in the last 72 hours. No results for input(s): CHOL, HDL, LDLCALC, TRIG, CHOLHDL, LDLDIRECT in the last 72 hours. No results for input(s): TSH, T4TOTAL, T3FREE, THYROIDAB in the last 72 hours.  Invalid input(s): FREET3 No results for input(s): VITAMINB12, FOLATE, FERRITIN, TIBC, IRON, RETICCTPCT in the last 72 hours. Coags: Recent Labs    08/16/19 2221  INR 1.0   Microbiology: Recent Results (from the past 240 hour(s))  Blood Culture (routine x 2)     Status: None (Preliminary result)   Collection Time: 08/16/19 10:28 PM   Specimen: BLOOD  Result Value Ref Range Status   Specimen Description BLOOD LEFT ANTECUBITAL  Final   Special Requests   Final    BOTTLES DRAWN AEROBIC AND ANAEROBIC Blood Culture results may not be optimal due to an inadequate volume of blood received in culture bottles   Culture  Setup Time   Final    GRAM POSITIVE RODS AEROBIC BOTTLE ONLY CRITICAL RESULT CALLED TO, READ BACK BY AND VERIFIED WITHAlmyra Free [facility] [phone] FCP Performed at [facility], [address], [place], [place] [address]    Culture GRAM POSITIVE RODS  Final   Report  Status PENDING  Incomplete  Blood Culture (routine x 2)     Status: None (Preliminary result)   Collection Time: 08/16/19 10:28 PM   Specimen: BLOOD RIGHT HAND  Result Value Ref Range Status   Specimen Description BLOOD RIGHT HAND  Final   Special Requests   Final    BOTTLES DRAWN AEROBIC AND ANAEROBIC Blood Culture adequate volume   Culture   Final    NO GROWTH < 12 HOURS Performed at [facility], [address] [place], [place] [address]    Report Status PENDING  Incomplete  SARS CORONAVIRUS 2 (TAT 6-24 HRS) Nasopharyngeal Nasopharyngeal Swab     Status: None   Collection Time: 08/17/19 12:01 AM   Specimen: Nasopharyngeal Swab  Result Value Ref Range Status   SARS Coronavirus 2 NEGATIVE NEGATIVE Final    Comment: (NOTE) SARS-CoV-2 target nucleic acids are NOT DETECTED. The SARS-CoV-2 RNA is generally detectable in upper and lower respiratory specimens during the acute phase of infection. Negative results do not preclude SARS-CoV-2 infection, do not rule out co-infections with other pathogens, and should not be used as the sole basis for treatment or other patient management decisions. Negative results must be combined with clinical observations, patient history, and epidemiological information. The expected result is Negative. Fact Sheet for Patients: HairSlick.no Fact Sheet for Healthcare Providers: quierodirigir.com This test is not yet approved or cleared by the [place] FDA and  has been authorized for detection and/or diagnosis of SARS-CoV-2 by FDA under an Emergency Use Authorization (EUA). This EUA will remain  in effect (meaning this test can be used) for the duration of the COVID-19 declaration under Section 56 4(b)(1) of the Act, 21 U.S.C. section 360bbb-3(b)(1), unless the authorization is terminated or revoked sooner. Performed at [facility], [address], [place], [place] [address]        TODAY-DAY OF DISCHARGE:  Subjective:   Robert Frank today has signed out against medical advice. He was warned about the life threatening and life disabling effects by RN.  Note he left AMA before start of this MD's shift.  Objective:   Blood pressure (!) 144/92, pulse (!) 106, temperature 99.1 F (37.3 C), temperature source Oral, resp. rate 19, height 5\' 8"  (1.727 m), weight 107 kg, SpO2 97 %.   DISCHARGE CONDITION: Not stable for discharge-left AMA  DISPOSITION: AMA   Follow with your PCP in 1 week   Total Time spent on discharge equals 25  minutes.  Signed 08/18/2019 7:10 AM

## 2019-08-18 NOTE — Progress Notes (Signed)
PT removed IV, and telemetry. States he is ready to leave. Refusing any further medical treatment. States he is leaving once the sun comes up. Pt is Axo x4.On call provider made aware. AMA paper provided but pt refusing to sign.

## 2019-08-18 NOTE — Progress Notes (Signed)
Pt dressed himself and signed AMA form before leaving the floor.

## 2019-08-18 NOTE — Progress Notes (Signed)
Pt. Leaving AMA. On call for [facility] paged to make aware.

## 2019-08-19 LAB — URINE CULTURE: Culture: >=10000 — AB

## 2019-08-20 LAB — CULTURE, BLOOD (ROUTINE X 2)

## 2019-08-21 LAB — CULTURE, BLOOD (ROUTINE X 2)
Culture: NO GROWTH
Special Requests: ADEQUATE

## 2019-12-04 ENCOUNTER — Emergency Department (HOSPITAL_COMMUNITY): Payer: Self-pay

## 2019-12-04 ENCOUNTER — Inpatient Hospital Stay (HOSPITAL_COMMUNITY)
Admission: EM | Admit: 2019-12-04 | Discharge: 2019-12-12 | DRG: 640 | Disposition: A | Discharge status: Home / Self Care | Payer: Self-pay | Attending: Internal Medicine | Admitting: Internal Medicine

## 2019-12-04 ENCOUNTER — Other Ambulatory Visit: Payer: Self-pay

## 2019-12-04 ENCOUNTER — Observation Stay (HOSPITAL_COMMUNITY): Payer: Self-pay

## 2019-12-04 DIAGNOSIS — J9601 Acute respiratory failure with hypoxia: Secondary | ICD-10-CM | POA: Diagnosis not present

## 2019-12-04 DIAGNOSIS — R21 Rash and other nonspecific skin eruption: Secondary | ICD-10-CM | POA: Diagnosis present

## 2019-12-04 DIAGNOSIS — R6510 Systemic inflammatory response syndrome (SIRS) of non-infectious origin without acute organ dysfunction: Secondary | ICD-10-CM | POA: Diagnosis present

## 2019-12-04 DIAGNOSIS — M79606 Pain in leg, unspecified: Secondary | ICD-10-CM

## 2019-12-04 DIAGNOSIS — R7989 Other specified abnormal findings of blood chemistry: Secondary | ICD-10-CM

## 2019-12-04 DIAGNOSIS — J9811 Atelectasis: Secondary | ICD-10-CM | POA: Diagnosis present

## 2019-12-04 DIAGNOSIS — L03119 Cellulitis of unspecified part of limb: Secondary | ICD-10-CM

## 2019-12-04 DIAGNOSIS — Z20822 Contact with and (suspected) exposure to covid-19: Secondary | ICD-10-CM | POA: Diagnosis present

## 2019-12-04 DIAGNOSIS — J449 Chronic obstructive pulmonary disease, unspecified: Secondary | ICD-10-CM | POA: Diagnosis present

## 2019-12-04 DIAGNOSIS — I6930 Unspecified sequelae of cerebral infarction: Secondary | ICD-10-CM

## 2019-12-04 DIAGNOSIS — L03115 Cellulitis of right lower limb: Secondary | ICD-10-CM | POA: Diagnosis present

## 2019-12-04 DIAGNOSIS — R748 Abnormal levels of other serum enzymes: Secondary | ICD-10-CM

## 2019-12-04 DIAGNOSIS — L89152 Pressure ulcer of sacral region, stage 2: Secondary | ICD-10-CM | POA: Diagnosis present

## 2019-12-04 DIAGNOSIS — R9431 Abnormal electrocardiogram [ECG] [EKG]: Secondary | ICD-10-CM | POA: Diagnosis present

## 2019-12-04 DIAGNOSIS — L89326 Pressure-induced deep tissue damage of left buttock: Secondary | ICD-10-CM | POA: Diagnosis present

## 2019-12-04 DIAGNOSIS — L03116 Cellulitis of left lower limb: Secondary | ICD-10-CM | POA: Diagnosis present

## 2019-12-04 DIAGNOSIS — R200 Anesthesia of skin: Secondary | ICD-10-CM | POA: Diagnosis present

## 2019-12-04 DIAGNOSIS — I5031 Acute diastolic (congestive) heart failure: Secondary | ICD-10-CM | POA: Diagnosis present

## 2019-12-04 DIAGNOSIS — F17210 Nicotine dependence, cigarettes, uncomplicated: Secondary | ICD-10-CM | POA: Diagnosis present

## 2019-12-04 DIAGNOSIS — R5381 Other malaise: Secondary | ICD-10-CM | POA: Diagnosis present

## 2019-12-04 DIAGNOSIS — K760 Fatty (change of) liver, not elsewhere classified: Secondary | ICD-10-CM | POA: Diagnosis present

## 2019-12-04 DIAGNOSIS — E878 Other disorders of electrolyte and fluid balance, not elsewhere classified: Secondary | ICD-10-CM | POA: Diagnosis present

## 2019-12-04 DIAGNOSIS — R7401 Elevation of levels of liver transaminase levels: Secondary | ICD-10-CM | POA: Diagnosis present

## 2019-12-04 DIAGNOSIS — L89313 Pressure ulcer of right buttock, stage 3: Secondary | ICD-10-CM | POA: Diagnosis present

## 2019-12-04 DIAGNOSIS — L8990 Pressure ulcer of unspecified site, unspecified stage: Secondary | ICD-10-CM | POA: Diagnosis present

## 2019-12-04 DIAGNOSIS — E871 Hypo-osmolality and hyponatremia: Principal | ICD-10-CM | POA: Diagnosis present

## 2019-12-04 DIAGNOSIS — R531 Weakness: Secondary | ICD-10-CM

## 2019-12-04 DIAGNOSIS — F1020 Alcohol dependence, uncomplicated: Secondary | ICD-10-CM | POA: Diagnosis present

## 2019-12-04 DIAGNOSIS — E876 Hypokalemia: Secondary | ICD-10-CM

## 2019-12-04 DIAGNOSIS — H5462 Unqualified visual loss, left eye, normal vision right eye: Secondary | ICD-10-CM | POA: Diagnosis present

## 2019-12-04 LAB — CBC WITH DIFFERENTIAL/PLATELET
Abs Immature Granulocytes: 0.21 10*3/uL — ABNORMAL HIGH (ref 0.00–0.07)
Basophils Absolute: 0.1 10*3/uL (ref 0.0–0.1)
Basophils Relative: 1 %
Eosinophils Absolute: 0.1 10*3/uL (ref 0.0–0.5)
Eosinophils Relative: 0 %
HCT: 44 % (ref 39.0–52.0)
Hemoglobin: 15.1 g/dL (ref 13.0–17.0)
Immature Granulocytes: 2 %
Lymphocytes Relative: 11 %
Lymphs Abs: 1.3 10*3/uL (ref 0.7–4.0)
MCH: 35.3 pg — ABNORMAL HIGH (ref 26.0–34.0)
MCHC: 34.3 g/dL (ref 30.0–36.0)
MCV: 102.8 fL — ABNORMAL HIGH (ref 80.0–100.0)
Monocytes Absolute: 2.1 10*3/uL — ABNORMAL HIGH (ref 0.1–1.0)
Monocytes Relative: 18 %
Neutro Abs: 7.8 10*3/uL — ABNORMAL HIGH (ref 1.7–7.7)
Neutrophils Relative %: 68 %
Platelets: 252 10*3/uL (ref 150–400)
RBC: 4.28 MIL/uL (ref 4.22–5.81)
RDW: 12.2 % (ref 11.5–15.5)
WBC: 11.5 10*3/uL — ABNORMAL HIGH (ref 4.0–10.5)
nRBC: 0 % (ref 0.0–0.2)

## 2019-12-04 LAB — PROTIME-INR
INR: 1.2 (ref 0.8–1.2)
Prothrombin Time: 14.3 seconds (ref 11.4–15.2)

## 2019-12-04 LAB — COMPREHENSIVE METABOLIC PANEL
ALT: 122 U/L — ABNORMAL HIGH (ref 0–44)
AST: 186 U/L — ABNORMAL HIGH (ref 15–41)
Albumin: 2.4 g/dL — ABNORMAL LOW (ref 3.5–5.0)
Alkaline Phosphatase: 67 U/L (ref 38–126)
Anion gap: 12 (ref 5–15)
BUN: 14 mg/dL (ref 6–20)
CO2: 26 mmol/L (ref 22–32)
Calcium: 8.8 mg/dL — ABNORMAL LOW (ref 8.9–10.3)
Chloride: 86 mmol/L — ABNORMAL LOW (ref 98–111)
Creatinine, Ser: 0.86 mg/dL (ref 0.61–1.24)
GFR calc Af Amer: >60 mL/min (ref >60)
GFR calc non Af Amer: >60 mL/min (ref >60)
Glucose, Bld: 107 mg/dL — ABNORMAL HIGH (ref 70–99)
Potassium: 3.7 mmol/L (ref 3.5–5.1)
Sodium: 124 mmol/L — ABNORMAL LOW (ref 135–145)
Total Bilirubin: 1.8 mg/dL — ABNORMAL HIGH (ref 0.3–1.2)
Total Protein: 7.4 g/dL (ref 6.5–8.1)

## 2019-12-04 LAB — ETHANOL: Alcohol, Ethyl (B): <10 mg/dL (ref <10)

## 2019-12-04 LAB — LACTIC ACID, PLASMA: Lactic Acid, Venous: 2.2 mmol/L (ref 0.5–1.9)

## 2019-12-04 LAB — APTT: aPTT: 32 seconds (ref 24–36)

## 2019-12-04 LAB — CK: Total CK: 1429 U/L — ABNORMAL HIGH (ref 49–397)

## 2019-12-04 MED ORDER — SODIUM CHLORIDE 0.9 % IV SOLN: INTRAVENOUS | Status: DC

## 2019-12-04 MED ORDER — LORAZEPAM 1 MG PO TABS: 0.0000 mg | ORAL_TABLET | Freq: Two times a day (BID) | ORAL | Status: DC

## 2019-12-04 MED ORDER — ONDANSETRON HCL 4 MG PO TABS: 4.0000 mg | ORAL_TABLET | Freq: Four times a day (QID) | ORAL | Status: DC | PRN

## 2019-12-04 MED ORDER — LORAZEPAM 1 MG PO TABS: 0.0000 mg | ORAL_TABLET | Freq: Four times a day (QID) | ORAL | Status: DC

## 2019-12-04 MED ORDER — ONDANSETRON HCL 4 MG/2ML IJ SOLN: 4.0000 mg | Freq: Four times a day (QID) | INTRAMUSCULAR | Status: DC | PRN

## 2019-12-04 MED ORDER — LORAZEPAM 1 MG PO TABS
1.0000 mg | ORAL_TABLET | ORAL | Status: DC | PRN
  Administered 2019-12-05: 1 mg via ORAL
  Filled 2019-12-04: qty 1

## 2019-12-04 MED ORDER — THIAMINE HCL 100 MG/ML IJ SOLN: 100.0000 mg | Freq: Every day | INTRAMUSCULAR | Status: DC

## 2019-12-04 MED ORDER — THIAMINE HCL 100 MG PO TABS
100.0000 mg | ORAL_TABLET | Freq: Every day | ORAL | Status: DC
  Administered 2019-12-04 – 2019-12-12 (×9): 100 mg via ORAL
  Filled 2019-12-04 (×9): qty 1

## 2019-12-04 MED ORDER — THIAMINE HCL 100 MG PO TABS: 100.0000 mg | ORAL_TABLET | Freq: Every day | ORAL | Status: DC

## 2019-12-04 MED ORDER — LORAZEPAM 2 MG/ML IJ SOLN: 0.0000 mg | Freq: Two times a day (BID) | INTRAMUSCULAR | Status: DC

## 2019-12-04 MED ORDER — LORAZEPAM 2 MG/ML IJ SOLN: 0.0000 mg | Freq: Four times a day (QID) | INTRAMUSCULAR | Status: DC

## 2019-12-04 MED ORDER — SODIUM CHLORIDE 0.9 % IV SOLN
1.0000 g | INTRAVENOUS | Status: DC
  Administered 2019-12-04: 1 g via INTRAVENOUS
  Filled 2019-12-04 (×2): qty 10

## 2019-12-04 MED ORDER — LACTATED RINGERS IV SOLN: INTRAVENOUS | Status: DC

## 2019-12-04 MED ORDER — VANCOMYCIN HCL 1750 MG/350ML IV SOLN
1750.0000 mg | Freq: Once | INTRAVENOUS | Status: AC
  Administered 2019-12-05: 1750 mg via INTRAVENOUS
  Filled 2019-12-04 (×2): qty 350

## 2019-12-04 MED ORDER — ENOXAPARIN SODIUM 40 MG/0.4ML ~~LOC~~ SOLN
40.0000 mg | SUBCUTANEOUS | Status: DC
  Administered 2019-12-04 – 2019-12-11 (×8): 40 mg via SUBCUTANEOUS
  Filled 2019-12-04 (×8): qty 0.4

## 2019-12-04 MED ORDER — FOLIC ACID 1 MG PO TABS
1.0000 mg | ORAL_TABLET | Freq: Every day | ORAL | Status: DC
  Administered 2019-12-04 – 2019-12-12 (×9): 1 mg via ORAL
  Filled 2019-12-04 (×9): qty 1

## 2019-12-04 MED ORDER — VANCOMYCIN HCL 1250 MG/250ML IV SOLN: 1250.0000 mg | Freq: Two times a day (BID) | INTRAVENOUS | Status: DC

## 2019-12-04 MED ORDER — THIAMINE HCL 100 MG/ML IJ SOLN
Freq: Once | INTRAVENOUS | Status: AC
  Filled 2019-12-04: qty 1000

## 2019-12-04 MED ORDER — ALBUMIN HUMAN 25 % IV SOLN
12.5000 g | Freq: Once | INTRAVENOUS | Status: AC
  Administered 2019-12-05: 12.5 g via INTRAVENOUS
  Filled 2019-12-04: qty 50

## 2019-12-04 MED ORDER — ADULT MULTIVITAMIN W/MINERALS CH
1.0000 | ORAL_TABLET | Freq: Every day | ORAL | Status: DC
  Administered 2019-12-04 – 2019-12-12 (×9): 1 via ORAL
  Filled 2019-12-04 (×8): qty 1

## 2019-12-04 MED ORDER — LORAZEPAM 2 MG/ML IJ SOLN: 1.0000 mg | INTRAMUSCULAR | Status: DC | PRN

## 2019-12-04 NOTE — H&P (Signed)
History and Physical    Robert Frank Robert Frank:[id number] DOB: 01/06/1966 DOA: 12/04/2019  PCP: Patient, No Pcp Per  Patient coming from: Home  I have personally briefly reviewed patient's old medical records in [facility]  Chief Complaint: Generalized weakness  HPI: Robert Frank is a 54 y.o. male with medical history significant of EtOH abuse that is ongoing, COPD, prior stroke with residual R sided numbness.  Pt has been feeling generally week the past few days to point where he couldn't get out of bed at home.  He was found at home, reportedly on floor for 3 days (though pt reports in bed for 3 days), unable to move since that time.  He thought he had sciatica, had friend pick him up and put him in bed.  He is oriented to person place and the month but not the day of the week.  Unable to provide any additional supplemental history. He is a current smoker.  EMS reports squalid living conditions on arrival on scene.  Pt admits to drinking heavily, amount "depends on how much I can get my hands on".   ED Course: Sodium 124 (was running low 130s previously this year), AST/ALT 186/122.  CPK 1429.  WBC 11.5.  Pt given vancomycin for BLE cellulitis and bedsores.  EtOH level of 0.   Review of Systems: As per HPI, otherwise all review of systems negative.  Past Medical History:  Diagnosis Date  . Alcoholism ([place])   . Blind left eye   . CVA (cerebral vascular accident) ([place])    12 years ago- residual R sided numbness    Past Surgical History:  Procedure Laterality Date  . ANTERIOR CRUCIATE LIGAMENT REPAIR       reports that he has been smoking. He has been smoking about 1.00 pack per day. He has never used smokeless tobacco. He reports current alcohol use. He reports previous drug use.  No Known Allergies  No family history on file. No reported sick contacts  Prior to Admission medications   Medication Sig Start Date End Date Taking? Authorizing Provider    albuterol (VENTOLIN HFA) 108 (90 Base) MCG/ACT inhaler Inhale 2 puffs into the lungs every 6 (six) hours as needed for wheezing or shortness of breath. 10/07/18   [name], Carley D, DO  amLODipine (NORVASC) 5 MG tablet Take 1 tablet (5 mg total) by mouth daily. Patient not taking: Reported on 08/17/2019 10/08/18   Modena Nunnery D, DO  azithromycin (ZITHROMAX) 250 MG tablet Take 2 tablets daily for 3 days. Patient not taking: Reported on 08/17/2019 10/08/18   Modena Nunnery D, DO  naproxen sodium (ALEVE) 220 MG tablet Take 220 mg by mouth 2 (two) times daily as needed (knee pain).    [provider]    Physical Exam: Vitals:   12/04/19 1832 12/04/19 1932 12/04/19 1955  BP: (!) 141/99 (!) 165/119 (!) 136/95  Pulse: (!) 118 (!) 108 (!) 104  Resp: 18 19   Temp: 98.3 F (36.8 C) 98.4 F (36.9 C)   TempSrc:  Oral   SpO2: 100% 100%   Weight:  91.2 kg   Height:  5\' 6"  (1.676 m)     Constitutional: NAD, calm, comfortable Eyes: Blind in L eye ENMT: Mucous membranes are moist. Posterior pharynx clear of any exudate or lesions.Normal dentition.  Neck: normal, supple, no masses, no thyromegaly Respiratory: clear to auscultation bilaterally, no wheezing, no crackles. Normal respiratory effort. No accessory muscle use.  Cardiovascular: Mild tachycardia  100-110, no murmurs / rubs / gallops. 2-3+ BLE Edema. 2+ pedal pulses. No carotid bruits.  Abdomen: no tenderness, no masses palpated. No hepatosplenomegaly. Bowel sounds positive.  Musculoskeletal: no clubbing / cyanosis. No joint deformity upper and lower extremities. Good ROM, no contractures. Normal muscle tone.  Skin:  Neurologic: CN 2-12 grossly intact. Sensation intact, DTR normal. Strength 4/5 in all 4.  Psychiatric: Flat affect, oriented to self, location, situation, not day of week.   Labs on Admission: I have personally reviewed following labs and imaging studies  CBC: Recent Labs  Lab 12/04/19 1950  WBC 11.5*   NEUTROABS 7.8*  HGB 15.1  HCT 44.0  MCV 102.8*  PLT 252   Basic Metabolic Panel: Recent Labs  Lab 12/04/19 1949  NA 124*  K 3.7  CL 86*  CO2 26  GLUCOSE 107*  BUN 14  CREATININE 0.86  CALCIUM 8.8*   GFR: Estimated Creatinine Clearance: 106.3 mL/min (by C-G formula based on SCr of 0.86 mg/dL). Liver Function Tests: Recent Labs  Lab 12/04/19 1949  AST 186*  ALT 122*  ALKPHOS 67  BILITOT 1.8*  PROT 7.4  ALBUMIN 2.4*   No results for input(s): LIPASE, AMYLASE in the last 168 hours. No results for input(s): AMMONIA in the last 168 hours. Coagulation Profile: Recent Labs  Lab 12/04/19 1949  INR 1.2   Cardiac Enzymes: Recent Labs  Lab 12/04/19 1949  CKTOTAL 1,429*   BNP (last 3 results) No results for input(s): PROBNP in the last 8760 hours. HbA1C: No results for input(s): HGBA1C in the last 72 hours. CBG: No results for input(s): GLUCAP in the last 168 hours. Lipid Profile: No results for input(s): CHOL, HDL, LDLCALC, TRIG, CHOLHDL, LDLDIRECT in the last 72 hours. Thyroid Function Tests: No results for input(s): TSH, T4TOTAL, FREET4, T3FREE, THYROIDAB in the last 72 hours. Anemia Panel: No results for input(s): VITAMINB12, FOLATE, FERRITIN, TIBC, IRON, RETICCTPCT in the last 72 hours. Urine analysis:    Component Value Date/Time   COLORURINE YELLOW 08/17/2019 0001   APPEARANCEUR CLEAR 08/17/2019 0001   LABSPEC 1.011 08/17/2019 0001   PHURINE 6.0 08/17/2019 0001   GLUCOSEU NEGATIVE 08/17/2019 0001   HGBUR NEGATIVE 08/17/2019 0001   BILIRUBINUR NEGATIVE 08/17/2019 0001   KETONESUR NEGATIVE 08/17/2019 0001   PROTEINUR 30 (A) 08/17/2019 0001   NITRITE NEGATIVE 08/17/2019 0001   LEUKOCYTESUR NEGATIVE 08/17/2019 0001    Radiological Exams on Admission: DG Chest Port 1 View  Result Date: 12/04/2019 CLINICAL DATA:  Tachycardia, weakness, found on ground for 3 days, unable to take care of himself EXAM: PORTABLE CHEST 1 VIEW COMPARISON:  Portable exam  1750 hours compared to 08/16/2019 FINDINGS: Upper normal heart size. Mediastinal contours and pulmonary vascularity normal. Atherosclerotic calcification aorta. Bibasilar atelectasis with slight chronic accentuation of LEFT basilar markings. Remaining lungs clear. No infiltrate, pleural effusion or pneumothorax. IMPRESSION: Bibasilar atelectasis. Electronically Signed   By: Ulyses Southward M.D.   On: 12/04/2019 18:00    EKG: Independently reviewed.  Assessment/Plan Principal Problem:   Generalized weakness Active Problems:   COPD (chronic obstructive pulmonary disease) (HCC)   Alcoholism (HCC)   Cellulitis   Transaminitis   Decubitus skin ulcer   Hyponatremia    1. Generalized weakness - 1. Cellulitis? Hyponatremia? EtOH? 2. Treating above issues first 3. PT/OT 4. SW consult 2. EtOH abuse - 1. CIWA 2. Getting Banana bag X1 overnight 3. Tele monitor 4. Mg, PO4 pending 3. Hyponatremia - 1. Banana bag x1 2. Repeat CMP in  AM 4. Cellulitis of legs and decubitus ulcers - 1. Rocephin per cellulitis pathway 2. Wound care consult 5. Transaminitis - 1. RUQ [place] pending 2. No abd pain 3. 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 Getting hepatitis pnl 5. Further work up / GI consult based on US findings in AM 6. Anasarca - 1. Gentle hydration overnight with banana bag 2. Also given 12.5g of albumin x1  DVT prophylaxis: Lovenox Code Status: Full Family Communication: No family in room Disposition Plan: TBD Consults called: None Admission status: Place in 8    Berda Shelvin M. DO [facility]  How to contact the [facility] Attending or Consulting provider 7A - 7P or covering provider during after hours 7P -7A, for this patient?  1. Check the care team in [facility] and look for a) attending/consulting TR[facility] provider listed and b) the [facility] team listed 2. Log into [email or web address]  Amion Physician Scheduling and messaging for groups and whole hospitals  On call and physician  scheduling software for group practices, residents, hospitalists and other medical providers for call, clinic, rotation and shift schedules. OnCall [employer] is a hospital-wide system for scheduling doctors and paging doctors on call. EasyPlot is for scientific plotting and data analysis.  [email or web address]  and use [facility]'s universal password to access. If you do not have the password, please contact the hospital operator.  3. Locate the [facility] provider you are looking for under [facility] and page to a number that you can be directly reached. 4. If you still have difficulty reaching the provider, please page the [facility] (Director on Call) for the Hospitalists listed on amion for assistance.  12/04/2019, 9:44 PM

## 2019-12-04 NOTE — ED Provider Notes (Signed)
Clinical Course as of Dec 03 2121  Sun Dec 04, 2019  2122 Spoke with Dr. Julian Reil, hospitalist. Agrees to admit the patient.   [SJ]    Clinical Course User Index [SJ] De Blanch 12/04/19 2123    Eber Hong, MD 12/06/19 1705

## 2019-12-04 NOTE — ED Provider Notes (Signed)
[facility] EMERGENCY DEPARTMENT Provider Note   CSN: [id number] Arrival date & time: 12/04/19  1702     History  Level 5 caveat due to altered mental status  Robert Frank is a 54 y.o. male with history of alcoholism, left eye blindness, CVA, COPD presenting brought in by EMS for evaluation of generalized weakness.  EMS believes the patient has been on the floor for 3 days.  The patient tells me he thinks he may have sciatica and had a friend pick him up and place him in his bed sometime ago and he has been unable to move since then.  He reports generalized weakness.  He denies chest pain, shortness of breath, abdominal pain, nausea, or vomiting.  He denies headaches.  He is oriented to person place and the month but not the day of the week.  Unable to provide any additional supplemental history. He is a current smoker.  EMS reports squalid living conditions on arrival on scene.  Patient endorses smoking a pack of cigarettes daily and drinks alcohol daily "depends on how much I can get my hands on", typically two 40s daily.  The history is provided by the patient and the EMS personnel.       Past Medical History:  Diagnosis Date  . Alcoholism (HCC)   . Blind left eye   . CVA (cerebral vascular accident) (HCC)    12 years ago- residual R sided numbness    Patient Active Problem List   Diagnosis Date Noted  . Alcohol withdrawal (HCC) 08/17/2019  . Encephalopathy acute 08/17/2019  . Alcoholism (HCC)   . Sepsis due to cellulitis (HCC)   . COPD exacerbation (HCC) 10/07/2018  . COPD (chronic obstructive pulmonary disease) (HCC) 10/06/2018  . COPD with acute exacerbation (HCC) 10/06/2018    Past Surgical History:  Procedure Laterality Date  . ANTERIOR CRUCIATE LIGAMENT REPAIR         No family history on file.  Social History   Tobacco Use  . Smoking status: Current Every Day Smoker    Packs/day: 1.00  . Smokeless tobacco: Never Used  Substance  Use Topics  . Alcohol use: Yes    Comment: 5 40 oz/day  . Drug use: Not Currently    Home Medications Prior to Admission medications   Medication Sig Start Date End Date Taking? Authorizing Provider  albuterol (VENTOLIN HFA) 108 (90 Base) MCG/ACT inhaler Inhale 2 puffs into the lungs every 6 (six) hours as needed for wheezing or shortness of breath. 10/07/18   [name], Carley D, DO  amLODipine (NORVASC) 5 MG tablet Take 1 tablet (5 mg total) by mouth daily. Patient not taking: Reported on 08/17/2019 10/08/18   Lenward Chancellor D, DO  azithromycin (ZITHROMAX) 250 MG tablet Take 2 tablets daily for 3 days. Patient not taking: Reported on 08/17/2019 10/08/18   Lenward Chancellor D, DO  naproxen sodium (ALEVE) 220 MG tablet Take 220 mg by mouth 2 (two) times daily as needed (knee pain).    [provider]    Allergies    Patient has no known allergies.  Review of Systems   Review of Systems  Unable to perform ROS: Mental status change    Physical Exam Updated Vital Signs BP (!) 136/95   Pulse (!) 104   Temp 98.4 F (36.9 C) (Oral)   Resp 19   Ht 5\' 6"  (1.676 m)   Wt 91.2 kg   SpO2 100%   BMI 32.44 kg/m  Physical Exam Vitals and nursing note reviewed.  Constitutional:      General: He is not in acute distress.    Appearance: He is well-developed.     Comments: Appearance is disheveled.  Hair and nails are dirty.  His jeans are soiled in urine and feces.  HENT:     Head: Normocephalic and atraumatic.  Eyes:     General:        Right eye: No discharge.        Left eye: No discharge.     Conjunctiva/sclera: Conjunctivae normal.     Comments: Left cornea hazy, pupil dilated and unresponsive to light.  Right pupil measures 3 mm, responsive to light.  Patient reports chronic blindness to the left eye.  Neck:     Vascular: No JVD.     Trachea: No tracheal deviation.  Cardiovascular:     Rate and Rhythm: Tachycardia present.     Pulses: Normal pulses.      Comments: 3+ pitting edema of the bilateral lower extremities.  Numerous erythematous Pulmonary:     Effort: Pulmonary effort is normal.     Comments: Diffuse rhonchi and wheezes. Abdominal:     General: There is no distension.     Palpations: Abdomen is soft.     Tenderness: There is no abdominal tenderness.  Musculoskeletal:     Cervical back: Neck supple.     Right lower leg: Edema present.     Left lower leg: Edema present.     Comments: 4/5 strength of bilateral lower extremity major muscle groups  Skin:    General: Skin is warm.     Findings: Erythema present.     Comments: See below images.  Patient with numerous skin lesions to the lower extremities, some weeping purulent fluid.  Neurological:     Mental Status: He is alert.     Comments: Oriented to person place and month/year but not daily.  He knows who the president is.  Sensation intact to light touch of bilateral lower extremities.  Psychiatric:        Behavior: Behavior normal.        ED Results / Procedures / Treatments   Labs (all labs ordered are listed, but only abnormal results are displayed) Labs Reviewed  LACTIC ACID, PLASMA - Abnormal; Notable for the following components:      Result Value   Lactic Acid, Venous 2.2 (*)    All other components within normal limits  COMPREHENSIVE METABOLIC PANEL - Abnormal; Notable for the following components:   Sodium 124 (*)    Chloride 86 (*)    Glucose, Bld 107 (*)    Calcium 8.8 (*)    Albumin 2.4 (*)    AST 186 (*)    ALT 122 (*)    Total Bilirubin 1.8 (*)    All other components within normal limits  CK - Abnormal; Notable for the following components:   Total CK 1,429 (*)    All other components within normal limits  CBC WITH DIFFERENTIAL/PLATELET - Abnormal; Notable for the following components:   WBC 11.5 (*)    MCV 102.8 (*)    MCH 35.3 (*)    Neutro Abs 7.8 (*)    Monocytes Absolute 2.1 (*)    Abs Immature Granulocytes 0.21 (*)    All other  components within normal limits  CULTURE, BLOOD (ROUTINE X 2)  CULTURE, BLOOD (ROUTINE X 2)  URINE CULTURE  MRSA PCR SCREENING  APTT  PROTIME-INR  ETHANOL  LACTIC ACID, PLASMA  CBC WITH DIFFERENTIAL/PLATELET  URINALYSIS, ROUTINE W REFLEX MICROSCOPIC  RAPID URINE DRUG SCREEN, HOSP PERFORMED  CBC WITH DIFFERENTIAL/PLATELET  MAGNESIUM  PHOSPHORUS    EKG EKG Interpretation  Date/Time:  Sunday December 04 2019 17:17:16 EDT Ventricular Rate:  115 PR Interval:    QRS Duration: 111 QT Interval:  328 QTC Calculation: 494 R Axis:   62 Text Interpretation: Sinus tachycardia Probable left atrial enlargement Artifact in lead(s) I II III aVR aVL aVF since last tracing no significant change Confirmed by Noemi Chapel [phone]) on 12/04/2019 5:45:59 PM   Radiology DG Chest Port 1 View  Result Date: 12/04/2019 CLINICAL DATA:  Tachycardia, weakness, found on ground for 3 days, unable to take care of himself EXAM: PORTABLE CHEST 1 VIEW COMPARISON:  Portable exam 1750 hours compared to 08/16/2019 FINDINGS: Upper normal heart size. Mediastinal contours and pulmonary vascularity normal. Atherosclerotic calcification aorta. Bibasilar atelectasis with slight chronic accentuation of LEFT basilar markings. Remaining lungs clear. No infiltrate, pleural effusion or pneumothorax. IMPRESSION: Bibasilar atelectasis. Electronically Signed   By: Lavonia Dana M.D.   On: 12/04/2019 18:00    Procedures .Critical Care Performed by: Renita Papa, PA-C Authorized by: Renita Papa, PA-C   Critical care provider statement:    Critical care time (minutes):  45   Critical care was necessary to treat or prevent imminent or life-threatening deterioration of the following conditions:  Metabolic crisis   Critical care was time spent personally by me on the following activities:  Discussions with consultants, evaluation of patient's response to treatment, examination of patient, ordering and performing treatments and  interventions, ordering and review of laboratory studies, ordering and review of radiographic studies, pulse oximetry, re-evaluation of patient's condition, obtaining history from patient or surrogate and review of old charts   (including critical care time)  Medications Ordered in ED Medications  vancomycin (VANCOREADY) IVPB 1750 mg/350 mL (has no administration in time range)  0.9 %  sodium chloride infusion (has no administration in time range)  LORazepam (ATIVAN) tablet 1-4 mg (has no administration in time range)    Or  LORazepam (ATIVAN) injection 1-4 mg (has no administration in time range)  thiamine tablet 100 mg (has no administration in time range)    Or  thiamine (B-1) injection 100 mg (has no administration in time range)  folic acid (FOLVITE) tablet 1 mg (has no administration in time range)  multivitamin with minerals tablet 1 tablet (has no administration in time range)  sodium chloride 0.9 % 1,000 mL with thiamine 916 mg, folic acid 1 mg, multivitamins adult 10 mL infusion ( Intravenous New Bag/Given 12/04/19 2001)    ED Course  I have reviewed the triage vital signs and the nursing notes.  Pertinent labs & imaging results that were available during my care of the patient were reviewed by me and considered in my medical decision making (see chart for details).    MDM Rules/Calculators/A&P                           Patient presents brought in by EMS for evaluation of inability to ambulate, generalized weakness.  Patient is afebrile, persistently tachycardic in the ED.  Vital signs otherwise stable.  Apparently patient's conditions at home were unlivable for EMS.  The patient is covered in urine and feces.  His lower extremities are edematous and several wounds are noted, some weeping purulent fluid.  Concern  for cellulitis.  I suspect that he is markedly dehydrated and third spacing fluids into his lower extremities.  Will start on LR infusion, obtain lab work, chest x-ray,  UA for further evaluation.  Will obtain CK as well as patient was thought to be bedbound for 3 days.  EKG shows sinus tachycardia, chest x-ray shows bibasilar atelectasis, no evidence of pneumonia.  Lab work reviewed and interpreted by myself shows leukocytosis of 11.5, no anemia.  No renal insufficiency however his LFTs are elevated as is total bilirubin.  His abdomen is soft and nontender and he denies abdominal pain, nausea, or vomiting.  Suspect LFTs are elevated in the setting of alcohol abuse however with elevated total bilirubin we will obtain right upper quadrant ultrasound to rule out obstructive process.  CK is also elevated suggesting mild rhabdomyolysis which could explain elevated total bilirubin as a result of hemolysis.  The patient is hyponatremic with a sodium of 124, hypochloremic with chloride of 86, elevated lactic acid level likely in the setting of severe dehydration.  Will slowly rehydrate to correct sodium in an attempt to avoid osmotic demyelination syndrome.  I suspect many of these metabolic derangements are due to his alcohol abuse.  His CIWA score is 0 and he does not appear to be withdrawing at this time.  Ethanol level is undetectable.     With leukocytosis and tachycardia, patient meets SIRS criteria though does not appear overtly septic at this time.  His lower extremities appear cellulitic, will start on IV vancomycin.  Patient will require admission to the hospital for further evaluation and management of severe dehydration, elevated LFTs, lower extremity cellulitis.  9:18 PM Signed out to PA Joy to follow up on admission.    Final Clinical Impression(s) / ED Diagnoses Final diagnoses:  Elevated LFTs  SIRS (systemic inflammatory response syndrome) (HCC)  Elevated CK  Hyponatremia  Hypochloremia    Rx / DC Orders ED Discharge Orders    None       Bennye Alm 12/04/19 2119    Eber Hong, MD 12/06/19 1705

## 2019-12-04 NOTE — ED Notes (Signed)
Pt transported to US via stretcher.  

## 2019-12-04 NOTE — ED Provider Notes (Signed)
This patient is a very ill-appearing 54 year old male, he is a chronic alcoholic, chronically debilitated, found in bed for at least 3 days by paramedics after someone in his house called for paramedic transport.  The patient is unable to give me a very clear history but states that when he tries to get up out of bed he has no strength in his legs and falls so he has not been getting out of bed.  He is covered with what appears to be severe edema, he is generally weak, he is able to speak and able to move both of his arms but appears generally weak.  His mucous membranes are slightly dehydrated and he has a slight icteric appearance of the eyes.  There is no abdominal fullness or tenderness, no obvious hepatosplenomegaly, he has bilateral lower extremity edema with rash and some decubitus appearing spots.  He appears to be incontinent of stool, he is tachycardic, he is very ill-appearing and likely in need of admission to this hospital.  Will rule out metabolic abnormalities, infectious abnormalities, he will need IV fluids and a banana bag to treat what is apparently multifactorial but likely alcohol related nutrient deficiencies at the baseline, consider Warnicke's, needs Covid evaluation prior to admission as well.  The patient is agreeable to the plan  Robert Frank was evaluated in Emergency Department on 12/06/2019 for the symptoms described in the history of present illness. He was evaluated in the context of the global COVID-19 pandemic, which necessitated consideration that the patient might be at risk for infection with the SARS-CoV-2 virus that causes COVID-19. Institutional protocols and algorithms that pertain to the evaluation of patients at risk for COVID-19 are in a state of rapid change based on information released by regulatory bodies including the CDC and federal and state organizations. These policies and algorithms were followed during the patient's care in the ED.  Medical screening  examination/treatment/procedure(s) were conducted as a shared visit with non-physician practitioner(s) and myself.  I personally evaluated the patient during the encounter.  Clinical Impression:   Final diagnoses:  Elevated LFTs  SIRS (systemic inflammatory response syndrome) (HCC)  Elevated CK  Hyponatremia  Hypochloremia     EKG Interpretation  Date/Time:  Sunday December 04 2019 20:47:13 EDT Ventricular Rate:  109 PR Interval:    QRS Duration: 89 QT Interval:  363 QTC Calculation: 489 R Axis:   9 Text Interpretation: Sinus tachycardia Borderline prolonged QT interval No acute changes No significant change since last tracing Confirmed by Derwood Kaplan [phone]) on 12/05/2019 8:28:35 PM           Eber Hong, MD 12/06/19 1706

## 2019-12-04 NOTE — Progress Notes (Signed)
Pharmacy Antibiotic Note  Robert Frank is a 54 y.o. male admitted on 12/04/2019 with cellulitis.  Pharmacy has been consulted for cellulitis dosing. WBC 11.5, SCr wnl. LA 2.2  Of note, patient is a chronic alcoholic and chronically debilitated and therefore SCr may not be reflective of true renal fx. This is further evidenced by his low albumin. Estimated CrCl ~ 80 mL/min   Plan: -Vancomycin 1750 mg IV once, then start vancomycin 1250 mg IV Q 12 hours -Monitor CBC, renal fx, cultures and clinical progress -VT at SS    Height: 5\' 6"  (167.6 cm) Weight: 91.2 kg (201 lb) IBW/kg (Calculated) : 63.8  Temp (24hrs), Avg:98.4 F (36.9 C), Min:98.3 F (36.8 C), Max:98.4 F (36.9 C)  Recent Labs  Lab 12/04/19 1831 12/04/19 1949 12/04/19 1950  WBC  --   --  11.5*  CREATININE  --  0.86  --   LATICACIDVEN 2.2*  --   --     Estimated Creatinine Clearance: 106.3 mL/min (by C-G formula based on SCr of 0.86 mg/dL).    No Known Allergies    Thank you for allowing pharmacy to be a part of this patient's care.  12/06/19, PharmD., BCPS, BCCCP Clinical Pharmacist Clinical phone for 12/04/19 until 9:30pm: [phone] If after 9:30pm, please refer to [facility] for unit-specific pharmacist

## 2019-12-04 NOTE — ED Notes (Signed)
EDP at bedside  

## 2019-12-04 NOTE — ED Triage Notes (Signed)
EMS reports pt found on ground x3 days and unable to care for himself.

## 2019-12-05 DIAGNOSIS — J449 Chronic obstructive pulmonary disease, unspecified: Secondary | ICD-10-CM

## 2019-12-05 DIAGNOSIS — E876 Hypokalemia: Secondary | ICD-10-CM

## 2019-12-05 LAB — BLOOD CULTURE ID PANEL (REFLEXED)

## 2019-12-05 LAB — CBC
HCT: 39.7 % (ref 39.0–52.0)
Hemoglobin: 13.6 g/dL (ref 13.0–17.0)
MCH: 35 pg — ABNORMAL HIGH (ref 26.0–34.0)
MCHC: 34.3 g/dL (ref 30.0–36.0)
MCV: 102.1 fL — ABNORMAL HIGH (ref 80.0–100.0)
Platelets: 253 10*3/uL (ref 150–400)
RBC: 3.89 MIL/uL — ABNORMAL LOW (ref 4.22–5.81)
RDW: 12.1 % (ref 11.5–15.5)
WBC: 9.9 10*3/uL (ref 4.0–10.5)
nRBC: 0 % (ref 0.0–0.2)

## 2019-12-05 LAB — URINALYSIS, ROUTINE W REFLEX MICROSCOPIC
Bilirubin Urine: NEGATIVE
Glucose, UA: NEGATIVE mg/dL
Ketones, ur: NEGATIVE mg/dL
Leukocytes,Ua: NEGATIVE
Nitrite: NEGATIVE
Protein, ur: 30 mg/dL — AB
Specific Gravity, Urine: 1.021 (ref 1.005–1.030)
pH: 5 (ref 5.0–8.0)

## 2019-12-05 LAB — COMPREHENSIVE METABOLIC PANEL
ALT: 108 U/L — ABNORMAL HIGH (ref 0–44)
AST: 165 U/L — ABNORMAL HIGH (ref 15–41)
Albumin: 2.1 g/dL — ABNORMAL LOW (ref 3.5–5.0)
Alkaline Phosphatase: 53 U/L (ref 38–126)
Anion gap: 12 (ref 5–15)
BUN: 11 mg/dL (ref 6–20)
CO2: 23 mmol/L (ref 22–32)
Calcium: 8.1 mg/dL — ABNORMAL LOW (ref 8.9–10.3)
Chloride: 88 mmol/L — ABNORMAL LOW (ref 98–111)
Creatinine, Ser: 0.69 mg/dL (ref 0.61–1.24)
GFR calc Af Amer: >60 mL/min (ref >60)
GFR calc non Af Amer: >60 mL/min (ref >60)
Glucose, Bld: 106 mg/dL — ABNORMAL HIGH (ref 70–99)
Potassium: 3 mmol/L — ABNORMAL LOW (ref 3.5–5.1)
Sodium: 123 mmol/L — ABNORMAL LOW (ref 135–145)
Total Bilirubin: 1.3 mg/dL — ABNORMAL HIGH (ref 0.3–1.2)
Total Protein: 6.5 g/dL (ref 6.5–8.1)

## 2019-12-05 LAB — RAPID URINE DRUG SCREEN, HOSP PERFORMED
Amphetamines: NOT DETECTED
Barbiturates: NOT DETECTED
Benzodiazepines: NOT DETECTED
Cocaine: NOT DETECTED
Opiates: NOT DETECTED
Tetrahydrocannabinol: NOT DETECTED

## 2019-12-05 LAB — PHOSPHORUS: Phosphorus: 3 mg/dL (ref 2.5–4.6)

## 2019-12-05 LAB — HIV ANTIBODY (ROUTINE TESTING W REFLEX): HIV Screen 4th Generation wRfx: NONREACTIVE

## 2019-12-05 LAB — HEPATITIS PANEL, ACUTE
HCV Ab: NONREACTIVE
Hep A IgM: NONREACTIVE
Hep B C IgM: NONREACTIVE
Hepatitis B Surface Ag: NONREACTIVE

## 2019-12-05 LAB — LACTIC ACID, PLASMA: Lactic Acid, Venous: 1.6 mmol/L (ref 0.5–1.9)

## 2019-12-05 LAB — MAGNESIUM: Magnesium: 1.7 mg/dL (ref 1.7–2.4)

## 2019-12-05 LAB — SARS CORONAVIRUS 2 BY RT PCR (HOSPITAL ORDER, PERFORMED IN ~~LOC~~ HOSPITAL LAB): SARS Coronavirus 2: NEGATIVE

## 2019-12-05 MED ORDER — SODIUM CHLORIDE 0.9 % IV SOLN: INTRAVENOUS | Status: DC

## 2019-12-05 MED ORDER — DEXTROSE-NACL 5-0.9 % IV SOLN: INTRAVENOUS | Status: DC

## 2019-12-05 MED ORDER — POTASSIUM CHLORIDE CRYS ER 20 MEQ PO TBCR
40.0000 meq | EXTENDED_RELEASE_TABLET | ORAL | Status: AC
  Administered 2019-12-05 (×2): 40 meq via ORAL
  Filled 2019-12-05 (×2): qty 2

## 2019-12-05 MED ORDER — LORAZEPAM 1 MG PO TABS
1.0000 mg | ORAL_TABLET | ORAL | Status: DC | PRN
  Administered 2019-12-05 – 2019-12-08 (×2): 1 mg via ORAL
  Filled 2019-12-05 (×2): qty 1

## 2019-12-05 MED ORDER — MAGNESIUM SULFATE 2 GM/50ML IV SOLN
2.0000 g | Freq: Once | INTRAVENOUS | Status: AC
  Administered 2019-12-05: 2 g via INTRAVENOUS
  Filled 2019-12-05: qty 50

## 2019-12-05 MED ORDER — ZINC OXIDE 12.8 % EX OINT
TOPICAL_OINTMENT | Freq: Two times a day (BID) | CUTANEOUS | Status: AC
  Filled 2019-12-05: qty 56.7

## 2019-12-05 NOTE — Evaluation (Signed)
Occupational Therapy Evaluation Patient Details Name: Robert Frank MRN: [id number] DOB: 10-15-1965 Today's Date: 12/05/2019    History of Present Illness Pt is a 54 year old man admitted after being found down x 3 days. + hyponatremia, cellulitis B LEs. PMH: ETOH abuse, tobacco abuse, COPD, CVA with residual R side numbness, blindness in L eye.   Clinical Impression   Pt reports he was walking with one crutch in the month leading up to admission, but independent in self care. Pt presents with significant weakness, decreased activity tolerance and impaired balance. He requires increased time to follow commands/process information, but is also HOH. Pt requires +2 assistance for all mobility and set up to total assist for ADL. Recommend SNF. Will follow acutely.    Follow Up Recommendations  SNF;Supervision/Assistance - 24 hour    Equipment Recommendations  3 in 1 bedside commode    Recommendations for Other Services       Precautions / Restrictions Precautions Precautions: Fall Precaution Comments: multiple wounds on buttocks and LEs      Mobility Bed Mobility Overal bed mobility: Needs Assistance Bed Mobility: Supine to Sit;Sit to Supine     Supine to sit: +2 for physical assistance;Max assist Sit to supine: +2 for physical assistance;Max assist   General bed mobility comments: assist for all aspects  Transfers Overall transfer level: Needs assistance Equipment used: Rolling walker (2 wheeled) Transfers: Sit to/from Stand Sit to Stand: +2 physical assistance;Max assist         General transfer comment: increased time and effort, assist to rise and steady    Balance Overall balance assessment: Needs assistance   Sitting balance-Leahy Scale: Fair       Standing balance-Leahy Scale: Poor Standing balance comment: B UE and +2 external support                           ADL either performed or assessed with clinical judgement   ADL Overall ADL's  : Needs assistance/impaired Eating/Feeding: Set up;Bed level   Grooming: Set up;Sitting;Bed level   Upper Body Bathing: Moderate assistance;Sitting   Lower Body Bathing: Total assistance;+2 for physical assistance;Sit to/from stand   Upper Body Dressing : Moderate assistance;Sitting   Lower Body Dressing: +2 for physical assistance;Total assistance;Sit to/from stand       Toileting- Architect and Hygiene: +2 for physical assistance;Total assistance;Sit to/from stand       Functional mobility during ADLs: +2 for physical assistance;Rolling walker;Moderate assistance (side steps along EOB)       Vision Patient Visual Report: No change from baseline Additional Comments: blind in L eye     Perception     Praxis      Pertinent Vitals/Pain Pain Assessment: Faces Faces Pain Scale: Hurts even more Pain Location: B LEs with movement/touch Pain Descriptors / Indicators: Grimacing;Guarding;Moaning Pain Intervention(s): Monitored during session;Repositioned     Hand Dominance Right   Extremity/Trunk Assessment Upper Extremity Assessment Upper Extremity Assessment: Generalized weakness   Lower Extremity Assessment Lower Extremity Assessment: Defer to PT evaluation       Communication Communication Communication: HOH   Cognition Arousal/Alertness: Awake/alert Behavior During Therapy: Flat affect Overall Cognitive Status: Impaired/Different from baseline Area of Impairment: Following commands;Problem solving;Memory                     Memory: Decreased short-term memory Following Commands: Follows one step commands with increased time     Problem Solving: Slow  processing;Decreased initiation;Difficulty sequencing;Requires verbal cues;Requires tactile cues General Comments: pt does not recall events of time leading up to hospitalization   General Comments       Exercises     Shoulder Instructions      Home Living Family/patient expects to  be discharged to:: Private residence Living Arrangements: Other relatives;Non-relatives/Friends (brother and two roommates) Available Help at Discharge: Available PRN/intermittently;Family;Friend(s) Type of Home: House Home Access: Level entry     Home Layout: Two level;Able to live on main level with bedroom/bathroom     Bathroom Shower/Tub: Teacher, early years/pre: Standard     Home Equipment: Crutches;Walker - 2 wheels (one)          Prior Functioning/Environment Level of Independence: Independent with assistive device(s)        Comments: was using one crutch for about 4 weeks leading up to hospitalization        OT Problem List: Decreased strength;Decreased activity tolerance;Impaired balance (sitting and/or standing);Decreased cognition;Decreased safety awareness;Decreased knowledge of use of DME or AE;Pain;Increased edema;Obesity      OT Treatment/Interventions: Self-care/ADL training;DME and/or AE instruction;Therapeutic activities;Patient/family education;Balance training    OT Goals(Current goals can be found in the care plan section) Acute Rehab OT Goals Patient Stated Goal: to get stronger OT Goal Formulation: With patient Time For Goal Achievement: 12/19/19 Potential to Achieve Goals: Good ADL Goals Pt Will Perform Grooming: with min assist;standing Pt Will Perform Upper Body Dressing: with set-up;sitting Pt Will Perform Lower Body Dressing: with mod assist;sit to/from stand Pt Will Transfer to Toilet: with min assist;ambulating;bedside commode Pt Will Perform Toileting - Clothing Manipulation and hygiene: with mod assist;sit to/from stand Additional ADL Goal #1: Pt will perform bed mobility with min assist in preparation for ADL.  OT Frequency: Min 2X/week   Barriers to D/C: Decreased caregiver support          Co-evaluation PT/OT/SLP Co-Evaluation/Treatment: Yes Reason for Co-Treatment: For patient/therapist safety   OT goals addressed  during session: ADL's and self-care      AM-PAC OT "6 Clicks" Daily Activity     Outcome Measure Help from another person eating meals?: A Little Help from another person taking care of personal grooming?: A Little Help from another person toileting, which includes using toliet, bedpan, or urinal?: Total Help from another person bathing (including washing, rinsing, drying)?: A Lot Help from another person to put on and taking off regular upper body clothing?: A Lot Help from another person to put on and taking off regular lower body clothing?: Total 6 Click Score: 12   End of Session Equipment Utilized During Treatment: Rolling walker;Oxygen (2L) Nurse Communication: Mobility status  Activity Tolerance: Patient tolerated treatment well Patient left: in bed;with call bell/phone within reach  OT Visit Diagnosis: Unsteadiness on feet (R26.81);Other abnormalities of gait and mobility (R26.89);History of falling (Z91.81);Muscle weakness (generalized) (M62.81);Pain;Other symptoms and signs involving cognitive function                Time: 1207-1225 OT Time Calculation (min): 18 min Charges:  OT General Charges $OT Visit: 1 Visit OT Evaluation $OT Eval Moderate Complexity: 1 Mod  Nestor Lewandowsky, OTR/L Acute Rehabilitation Services Pager: [phone] Office: [phone]  Malka So 12/05/2019, 1:14 PM

## 2019-12-05 NOTE — Progress Notes (Signed)
PHARMACY - PHYSICIAN COMMUNICATION CRITICAL VALUE ALERT - BLOOD CULTURE IDENTIFICATION (BCID)  Robert Frank is an 54 y.o. male who presented to [facility] on 12/04/2019 with a chief complaint of generalized weakness.   Assessment:  54 year old male admitted with generalized weakness. Noted to have cellulitis of the legs with decubitus ulcers and started on ceftriaxone. Now with coag negative staph in 1/4 blood cultures that likely represents contamination.   Name of physician (or Provider) Contacted: Arrien  Current antibiotics: Ceftriaxone  Changes to prescribed antibiotics recommended:  None- Continue ceftriaxone   Results for orders placed or performed during the hospital encounter of 12/04/19  Blood Culture ID Panel (Reflexed) (Collected: 12/04/2019  7:40 PM)  Result Value Ref Range   Enterococcus species NOT DETECTED NOT DETECTED   Listeria monocytogenes NOT DETECTED NOT DETECTED   Staphylococcus species DETECTED (A) NOT DETECTED   Staphylococcus aureus (BCID) NOT DETECTED NOT DETECTED   Methicillin resistance DETECTED (A) NOT DETECTED   Streptococcus species NOT DETECTED NOT DETECTED   Streptococcus agalactiae NOT DETECTED NOT DETECTED   Streptococcus pneumoniae NOT DETECTED NOT DETECTED   Streptococcus pyogenes NOT DETECTED NOT DETECTED   Acinetobacter baumannii NOT DETECTED NOT DETECTED   Enterobacteriaceae species NOT DETECTED NOT DETECTED   Enterobacter cloacae complex NOT DETECTED NOT DETECTED   Escherichia coli NOT DETECTED NOT DETECTED   Klebsiella oxytoca NOT DETECTED NOT DETECTED   Klebsiella pneumoniae NOT DETECTED NOT DETECTED   Proteus species NOT DETECTED NOT DETECTED   Serratia marcescens NOT DETECTED NOT DETECTED   Haemophilus influenzae NOT DETECTED NOT DETECTED   Neisseria meningitidis NOT DETECTED NOT DETECTED   Pseudomonas aeruginosa NOT DETECTED NOT DETECTED   Candida albicans NOT DETECTED NOT DETECTED   Candida glabrata NOT DETECTED NOT DETECTED    Candida krusei NOT DETECTED NOT DETECTED   Candida parapsilosis NOT DETECTED NOT DETECTED   Candida tropicalis NOT DETECTED NOT DETECTED    Sharin Mons, PharmD, BCPS, BCIDP Infectious Diseases Clinical Pharmacist Phone: [phone] 12/05/2019  2:40 PM

## 2019-12-05 NOTE — Consult Note (Signed)
WOC consulted for pressure injuries. Requested images. Discussed with bedside nurse who is not aware of any pressure injuries from the report received.  She will assess patient's skin and contact me if any further skin/wound needs identified.  Has generalized LE edema with redness. bruising and scabbed areas over the bilateral knees.   Discussed POC with patient and bedside nurse.  Re consult if needed, will not follow at this time. Thanks  Shakeila Pfarr [employer], RN,CWOCN, CNS, CWON-AP [phone]) .

## 2019-12-05 NOTE — ED Notes (Signed)
RN has called main pharmacy they will be tubing vancomycin shortly. Pt will be awaiting medication

## 2019-12-05 NOTE — Progress Notes (Signed)
12/05/19 1625  PT Visit Information  Last PT Received On 12/05/19  Assistance Needed +2  PT/OT/SLP Co-Evaluation/Treatment Yes  Reason for Co-Treatment For patient/therapist safety;To address functional/ADL transfers  PT goals addressed during session Mobility/safety with mobility;Balance;Proper use of DME  History of Present Illness Pt is a 54 year old man admitted after being found down x 3 days. + hyponatremia, cellulitis B LEs. PMH: ETOH abuse, tobacco abuse, COPD, CVA with residual R side numbness, blindness in L eye.  Precautions  Precautions Fall  Precaution Comments multiple wounds on buttocks and LEs  Restrictions  Weight Bearing Restrictions No  Home Living  Family/patient expects to be discharged to: Private residence  Living Arrangements Other relatives;Non-relatives/Friends (brother and two roommates)  Available Help at Discharge Available PRN/intermittently;Family;Friend(s)  Type of Home House  Home Access Level entry  Home Layout Two level;Able to live on main level with bedroom/bathroom  Bathroom Writer - 2 wheels (one)  Prior Function  Level of Independence Independent with assistive device(s)  Comments was using one crutch for about 4 weeks leading up to hospitalization  Communication  Communication HOH  Pain Assessment  Pain Assessment Faces  Faces Pain Scale 6  Pain Location B LEs with movement/touch  Pain Descriptors / Indicators Grimacing;Guarding;Moaning  Pain Intervention(s) Limited activity within patient's tolerance;Monitored during session;Repositioned  Cognition  Arousal/Alertness Awake/alert  Behavior During Therapy Flat affect  Overall Cognitive Status Impaired/Different from baseline  Area of Impairment Following commands;Problem solving;Memory  Memory Decreased short-term memory  Following Commands Follows one step commands with increased time  Problem Solving  Slow processing;Decreased initiation;Difficulty sequencing;Requires verbal cues;Requires tactile cues  General Comments pt does not recall events of time leading up to hospitalization  Upper Extremity Assessment  Upper Extremity Assessment Defer to OT evaluation  Lower Extremity Assessment  Lower Extremity Assessment Generalized weakness;RLE deficits/detail;LLE deficits/detail  RLE Deficits / Details Multiple wounds on BLE. Increased swelling and redness.   LLE Deficits / Details Multiple wounds on BLE. Increased swelling and redness.   Cervical / Trunk Assessment  Cervical / Trunk Assessment Kyphotic  Bed Mobility  Overal bed mobility Needs Assistance  Bed Mobility Supine to Sit;Sit to Supine  Supine to sit +2 for physical assistance;Max assist  Sit to supine +2 for physical assistance;Max assist  General bed mobility comments assist for trunk and BLE. Pt requiring increased time secondary to BLE pain   Transfers  Overall transfer level Needs assistance  Equipment used Rolling walker (2 wheeled)  Transfers Sit to/from Stand  Sit to Stand +2 physical assistance;Max assist  General transfer comment increased time and effort, assist to rise and steady. Cues for upright posture and knee extension as pt initially with knee flexion bilaterally.   Ambulation/Gait  Ambulation/Gait assistance Min assist;Mod assist;+2 physical assistance  Assistive device Rolling walker (2 wheeled)  Gait Pattern/deviations Step-through pattern;Decreased weight shift to left  General Gait Details Pt taking side steps at EOB with min-mod A +2. Pt with difficulty bearing weight on LLE. Cues for sequencing using RW.   Balance  Overall balance assessment Needs assistance  Sitting-balance support No upper extremity supported  Sitting balance-Leahy Scale Fair  Standing balance support Bilateral upper extremity supported  Standing balance-Leahy Scale Poor  Standing balance comment B UE and +2 external support  PT -  End of Session  Equipment Utilized During Treatment Gait belt  Activity Tolerance Patient limited by pain  Patient left in bed;with call bell/phone  within reach;with nursing/sitter in room (on stretcher in ED )  Nurse Communication Mobility status  PT Assessment  PT Recommendation/Assessment Patient needs continued PT services  PT Visit Diagnosis Unsteadiness on feet (R26.81);History of falling (Z91.81);Muscle weakness (generalized) (M62.81);Repeated falls (R29.6);Pain;Difficulty in walking, not elsewhere classified (R26.2)  Pain - Right/Left  (bilateral )  Pain - part of body Leg  PT Problem List Decreased activity tolerance;Decreased balance;Decreased mobility;Decreased range of motion;Decreased strength;Decreased knowledge of use of DME;Decreased knowledge of precautions;Pain  PT Plan  PT Frequency (ACUTE ONLY) Min 2X/week  PT Treatment/Interventions (ACUTE ONLY) DME instruction;Gait training;Functional mobility training;Balance training;Therapeutic activities;Therapeutic exercise;Patient/family education  AM-PAC PT "6 Clicks" Mobility Outcome Measure (Version 2)  Help needed turning from your back to your side while in a flat bed without using bedrails? 1  Help needed moving from lying on your back to sitting on the side of a flat bed without using bedrails? 1  Help needed moving to and from a bed to a chair (including a wheelchair)? 2  Help needed standing up from a chair using your arms (e.g., wheelchair or bedside chair)? 1  Help needed to walk in hospital room? 1  Help needed climbing 3-5 steps with a railing?  1  6 Click Score 7  Consider Recommendation of Discharge To: CIR/SNF/LTACH  PT Recommendation  Follow Up Recommendations SNF;Supervision/Assistance - 24 hour  PT equipment Wheelchair (measurements PT);Wheelchair cushion (measurements PT)  Individuals Consulted  Consulted and Agree with Results and Recommendations Patient  Acute Rehab PT Goals  Patient Stated Goal to get  stronger  PT Goal Formulation With patient  Time For Goal Achievement 12/19/19  Potential to Achieve Goals Good  PT Time Calculation  PT Start Time (ACUTE ONLY) 1207  PT Stop Time (ACUTE ONLY) 1226  PT Time Calculation (min) (ACUTE ONLY) 19 min  PT General Charges  $$ ACUTE PT VISIT 1 Visit  PT Evaluation  $PT Eval Moderate Complexity 1 Mod  Written Expression  Dominant Hand Right   Pt admitted secondary to problem above with deficits below. Pt requiring max A +2 for bed mobility and to stand at EOB using RW. Required min to mod A +2 to take side steps at EOB. Pt with increased pain in BLE. Reports he does not have necessary assist at home. Recommending SNF level therapies at d/c. Will continue to follow acutely to maximize functional mobility independence and safety.   Reuel Derby, PT, DPT  Acute Rehabilitation Services  Pager: [phone] Office: [phone]

## 2019-12-05 NOTE — Progress Notes (Addendum)
PROGRESS NOTE    Robert Frank  YNW:[id number] DOB: January 20, 1966 DOA: 12/04/2019 PCP: Patient, No Pcp Per    Brief Narrative:  Patient admitted to the hospital working diagnosis of generalized weakness in the setting of hyponatremia and alcohol abuse.  54 year old male who presented with generalized weakness.  He does have significant past medical history of alcohol abuse, COPD, and history of CVA.  Patient had several weeks of generalized weakness to the point where he could not get out of his bed.  On the day of admission he was found down on the floor, apparently laying there for 3 days unable to stand up.  On his initial physical examination blood pressure 141/99, heart rate 118, respiratory rate 18, temperature 98.3, oxygen saturation 98.3%.  He had moist mucous membranes, his lungs were clear to auscultation bilaterally, heart S1-S2, present, tachycardic, abdomen soft, positive lower extremity edema.  Patient had decreased strength 4 out of 5 all 4 extremities.  Flat affect.    Assessment & Plan:   Principal Problem:   Hyponatremia Active Problems:   COPD (chronic obstructive pulmonary disease) (HCC)   Alcoholism (HCC)   Generalized weakness   Transaminitis   Decubitus skin ulcer   Hypokalemia   Hypomagnesemia   1. Symptomatic hyponatremia/ hypokalemia/ hypomagensemia. Patient with persistent hyponatremia, hypokalemia and hypomagnesemia. Na 123, K 3,0, Mg 1.7. Renal function with serum cr at 0,69 and serum bicarbonate at 23 to 26.    Possible hypovolemic due to poor oral intake. Will resume isotonic saline at 75 ml per H, add Kcl 80 meq in 2 divided doses and 2 g Mag sulfate. Will follow up on renal panel and electrolytes in am, including Mg and P.   Liberate diet and follow with nutrition recommendations, patient will need Pt and Ot.   2. Etho abuse. Patient with chronic alcohol use and abuse. No clinical signs of acute withdrawal. Will continue with as needed lorazepam,  will hold on CIWA protocol for now.   Continue with thiamine, and multivitamins. Check B12.   3. Pretibial pressure ulcer stage 2. Present on admission, continue with local wound care.   4. Elevated liver enzymes. AST 165 and ALT 108. T Bil 1,3. Liver US with steatosis. Will follow with chronic hepatitis panel  Skin lesions with no purulence or significant erythema, will dc antibiotic therapy for now. Ruled out cellulitis.   Patient continue to be at high risk for worsening electrolyte disturbances.    Status is: Observation  The patient will require care spanning > 2 midnights and should be moved to inpatient because: IV treatments appropriate due to intensity of illness or inability to take PO and Inpatient level of care appropriate due to severity of illness  Dispo: The patient is from: Home              Anticipated d/c is to: Home              Anticipated d/c date is: 3 days              Patient currently is not medically stable to d/c.   DVT prophylaxis: Enoxaparin   Code Status:   full  Family Communication:  No family at the bedside      Nutrition Status:           Skin Documentation: Pressure Injury 12/05/19 Pretibial Left;Lateral Stage 2 -  Partial thickness loss of dermis presenting as a shallow open injury with a red, pink wound bed without slough.  3.5x3x0.25 (Active)  12/05/19 1400  Location: Pretibial  Location Orientation: Left;Lateral  Staging: Stage 2 -  Partial thickness loss of dermis presenting as a shallow open injury with a red, pink wound bed without slough.  Wound Description (Comments): 3.5x3x0.25  Present on Admission: Yes     Pressure Injury 12/05/19 Pretibial Proximal;Right;Anterior (Active)  12/05/19 1400  Location: Pretibial  Location Orientation: Proximal;Right;Anterior  Staging:   Wound Description (Comments):   Present on Admission:         Subjective: Patient hyporeactive, not very interactive, not in apparent pain or  dyspnea.   Objective: Vitals:   12/05/19 0430 12/05/19 0645 12/05/19 1208 12/05/19 1309  BP: 135/87 (!) 145/94 134/85 (!) 132/95  Pulse: 91 94 94 94  Resp: 19 14 19 18   Temp:   97.8 F (36.6 C) 99.1 F (37.3 C)  TempSrc:   Oral Oral  SpO2: 100% 100% 100% 100%  Weight:      Height:    5\' 7"  (1.702 m)    Intake/Output Summary (Last 24 hours) at 12/05/2019 1529 Last data filed at 12/05/2019 1400 Gross per 24 hour  Intake 240 ml  Output --  Net 240 ml   Filed Weights   12/04/19 1932  Weight: 91.2 kg    Examination:   General: Not in pain or dyspnea, deconditioned and ill looking appearing  Neurology: awake and alert, non focal E ENT: mild pallor, no icterus, oral mucosa moist Cardiovascular: No JVD. S1-S2 present, rhythmic, no gallops, rubs, or murmurs. No lower extremity edema. Pulmonary: positive breath sounds bilaterally, adequate air movement, no wheezing, rhonchi or rales. Gastrointestinal. Abdomen with no organomegaly, non tender, no rebound or guarding Skin. No rashes Musculoskeletal: no joint deformities     Data Reviewed: I have personally reviewed following labs and imaging studies  CBC: Recent Labs  Lab 12/04/19 1950 12/05/19 0041  WBC 11.5* 9.9  NEUTROABS 7.8*  --   HGB 15.1 13.6  HCT 44.0 39.7  MCV 102.8* 102.1*  PLT 252 591   Basic Metabolic Panel: Recent Labs  Lab 12/04/19 1949 12/05/19 0040  NA 124* 123*  K 3.7 3.0*  CL 86* 88*  CO2 26 23  GLUCOSE 107* 106*  BUN 14 11  CREATININE 0.86 0.69  CALCIUM 8.8* 8.1*  MG  --  1.7  PHOS  --  3.0   GFR: Estimated Creatinine Clearance: 116.3 mL/min (by C-G formula based on SCr of 0.69 mg/dL). Liver Function Tests: Recent Labs  Lab 12/04/19 1949 12/05/19 0040  AST 186* 165*  ALT 122* 108*  ALKPHOS 67 53  BILITOT 1.8* 1.3*  PROT 7.4 6.5  ALBUMIN 2.4* 2.1*   No results for input(s): LIPASE, AMYLASE in the last 168 hours. No results for input(s): AMMONIA in the last 168  hours. Coagulation Profile: Recent Labs  Lab 12/04/19 1949  INR 1.2   Cardiac Enzymes: Recent Labs  Lab 12/04/19 1949  CKTOTAL 1,429*   BNP (last 3 results) No results for input(s): PROBNP in the last 8760 hours. HbA1C: No results for input(s): HGBA1C in the last 72 hours. CBG: No results for input(s): GLUCAP in the last 168 hours. Lipid Profile: No results for input(s): CHOL, HDL, LDLCALC, TRIG, CHOLHDL, LDLDIRECT in the last 72 hours. Thyroid Function Tests: No results for input(s): TSH, T4TOTAL, FREET4, T3FREE, THYROIDAB in the last 72 hours. Anemia Panel: No results for input(s): VITAMINB12, FOLATE, FERRITIN, TIBC, IRON, RETICCTPCT in the last 72 hours.    Radiology Studies: I have  reviewed all of the imaging during this hospital visit personally     Scheduled Meds: . enoxaparin (LOVENOX) injection  40 mg Subcutaneous Q24H  . folic acid  1 mg Oral Daily  . multivitamin with minerals  1 tablet Oral Daily  . thiamine  100 mg Oral Daily   Or  . thiamine  100 mg Intravenous Daily  . Zinc Oxide   Topical BID   Continuous Infusions: . cefTRIAXone (ROCEPHIN)  IV Stopped (12/04/19 2242)     LOS: 0 days        Kinslie Hove Annett Gula, MD

## 2019-12-05 NOTE — Plan of Care (Signed)
  Problem: Activity: Goal: Risk for activity intolerance will decrease Outcome: Progressing   Problem: Health Behavior/Discharge Planning: Goal: Ability to manage health-related needs will improve Outcome: Progressing   Problem: Clinical Measurements: Goal: Will remain free from infection Outcome: Progressing   Problem: Nutrition: Goal: Adequate nutrition will be maintained Outcome: Progressing   Problem: Skin Integrity: Goal: Risk for impaired skin integrity will decrease Outcome: Progressing

## 2019-12-05 NOTE — Consult Note (Signed)
WOC Nurse Consult Note: Reason for Consult: pressure injuries Wound type: 1. Stage 2 Pressure Injury: sacrum 2. Stage 3 Pressure Injury: right buttock 3. Deep Tissue Injury: left upper buttock just lateral from sacral PI 4. Full thickness ulcer: LLE; posterior malleolar region 5. Non blanchable purple discoloration; skin is intact right inner thigh 10cm x 10cm 6. Right knee; trauma; scabbed Moisture associated skin damage over the bilateral buttocks and posterior thighs partial thickness skin loss Pressure Injury POA: Yes Measurement: bedside nursing to enter on the flowsheet. WOC was present during the 2person admission skin assessment Wound DEY:CXKGYJ are all clean, non purulent.  Drainage (amount, consistency, odor) none Periwound:cellulitis noted LLE with pain to palpation Dressing procedure/placement/frequency: 1. Xeroform to the open wounds of the bilateral LEs; change daily 2. Silicone foam to the buttock and sacral wounds, change every 3 days and lift to inspect each shift for changes 3. Triple paste to protect buttocks from further insult from incontinence. 4. Air mattress for moisture management and pressure redistribution  Lesion over the patient's back; they are not open but appear to be circular; ? Bug bites.  Maggot found in bed.   Discussed POC with patient and bedside nurse.  Re consult if needed, will not follow at this time. Thanks  Joyia Riehle [employer], RN,CWOCN, CNS, CWON-AP [phone])

## 2019-12-05 NOTE — ED Notes (Signed)
Pt oxygen saturations dropped to mid 80's while sleep, 2L [place] placed on pt . Pt now sating 100 percent

## 2019-12-05 NOTE — ED Notes (Signed)
Attempted to call report x 1  

## 2019-12-06 DIAGNOSIS — E871 Hypo-osmolality and hyponatremia: Principal | ICD-10-CM

## 2019-12-06 LAB — CULTURE, BLOOD (ROUTINE X 2): Special Requests: ADEQUATE

## 2019-12-06 LAB — BASIC METABOLIC PANEL
Anion gap: 8 (ref 5–15)
BUN: 6 mg/dL (ref 6–20)
CO2: 27 mmol/L (ref 22–32)
Calcium: 8.2 mg/dL — ABNORMAL LOW (ref 8.9–10.3)
Chloride: 96 mmol/L — ABNORMAL LOW (ref 98–111)
Creatinine, Ser: 0.59 mg/dL — ABNORMAL LOW (ref 0.61–1.24)
GFR calc Af Amer: >60 mL/min (ref >60)
GFR calc non Af Amer: >60 mL/min (ref >60)
Glucose, Bld: 108 mg/dL — ABNORMAL HIGH (ref 70–99)
Potassium: 3.3 mmol/L — ABNORMAL LOW (ref 3.5–5.1)
Sodium: 131 mmol/L — ABNORMAL LOW (ref 135–145)

## 2019-12-06 LAB — PHOSPHORUS: Phosphorus: 3.2 mg/dL (ref 2.5–4.6)

## 2019-12-06 LAB — MRSA PCR SCREENING: MRSA by PCR: NEGATIVE

## 2019-12-06 LAB — VITAMIN B12: Vitamin B-12: 716 pg/mL (ref 180–914)

## 2019-12-06 LAB — MAGNESIUM: Magnesium: 1.9 mg/dL (ref 1.7–2.4)

## 2019-12-06 MED ORDER — ACETAMINOPHEN 325 MG PO TABS
650.0000 mg | ORAL_TABLET | Freq: Three times a day (TID) | ORAL | Status: DC | PRN
  Administered 2019-12-10: 650 mg via ORAL
  Filled 2019-12-06: qty 2

## 2019-12-06 MED ORDER — POTASSIUM CHLORIDE CRYS ER 20 MEQ PO TBCR
40.0000 meq | EXTENDED_RELEASE_TABLET | ORAL | Status: AC
  Administered 2019-12-06 (×2): 40 meq via ORAL
  Filled 2019-12-06 (×2): qty 2

## 2019-12-06 MED ORDER — AMOXICILLIN-POT CLAVULANATE 875-125 MG PO TABS
1.0000 | ORAL_TABLET | Freq: Two times a day (BID) | ORAL | Status: AC
  Administered 2019-12-06 – 2019-12-11 (×10): 1 via ORAL
  Filled 2019-12-06 (×10): qty 1

## 2019-12-06 MED ORDER — ENSURE ENLIVE PO LIQD
237.0000 mL | Freq: Three times a day (TID) | ORAL | Status: DC
  Administered 2019-12-06 – 2019-12-12 (×18): 237 mL via ORAL

## 2019-12-06 NOTE — Plan of Care (Signed)
  Problem: Clinical Measurements: Goal: Will remain free from infection Outcome: Progressing   Problem: Skin Integrity: Goal: Risk for impaired skin integrity will decrease Outcome: Progressing   

## 2019-12-06 NOTE — Progress Notes (Signed)
PROGRESS NOTE    Robert Frank  KGM:[id number] DOB: 07/08/1965 DOA: 12/04/2019 PCP: Patient, No Pcp Per    Brief Narrative:  Patient admitted to the hospital working diagnosis of generalized weakness in the setting of hyponatremia and alcohol abuse.  54 year old male who presented with generalized weakness.  He does have significant past medical history of alcohol abuse, COPD, and history of CVA.  Patient had several weeks of generalized weakness to the point where he could not get out of his bed.  On the day of admission he was found down on the floor, apparently laying there for 3 days unable to stand up.  On his initial physical examination blood pressure 141/99, heart rate 118, respiratory rate 18, temperature 98.3, oxygen saturation 98.3%.  He had moist mucous membranes, his lungs were clear to auscultation bilaterally, heart S1-S2, present, tachycardic, abdomen soft, positive lower extremity edema.  Patient had decreased strength 4 out of 5 all 4 extremities.  Flat affect.    Assessment & Plan:   Principal Problem:   Hyponatremia Active Problems:   COPD (chronic obstructive pulmonary disease) (HCC)   Alcoholism (HCC)   Generalized weakness   Transaminitis   Decubitus skin ulcer   Hypokalemia   Hypomagnesemia   1. Symptomatic hyponatremia/ hypokalemia/ hypomagensemia. Patient with persistent hyponatremia, hypokalemia and hypomagnesemia. Na 123, K 3,0, Mg 1.7. Renal function with serum cr at 0,69 and serum bicarbonate at 23 to 26.  Possible hypovolemic due to poor oral intake.  Now sodium and volume corrected. Add Kcl 80 meq in 2 divided doses and 2 g Mag sulfate. Will follow up on renal panel and electrolytes in am, including Mg and P.   Liberate diet and follow with nutrition recommendations, patient will need Pt and Ot.   2. Etho abuse. Patient with chronic alcohol use and abuse. No clinical signs of acute withdrawal. Will continue with as needed lorazepam, will hold on  CIWA protocol for now.   Continue with thiamine, and multivitamins. Check B12.   3. Pretibial pressure ulcer stage 2. Present on admission, continue with local wound care.   4. Elevated liver enzymes. AST 165 and ALT 108. T Bil 1,3. Liver US with steatosis. Will follow with chronic hepatitis panel  5.  Cellulitis of the leg. I will add doxycycline for the treatment.  6.  Acute hypoxic respiratory failure Chest x-ray shows bilateral atelectasis. Patient is on 3 L of oxygen right now. Do not think the patient will be able to take incentive spirometry.  Monitor.  Patient continue to be at high risk for worsening electrolyte disturbances.    Status is: Inpatient  Dispo: The patient is from: Home              Anticipated d/c is to: SNF              Anticipated d/c date is: 3 days              Patient currently is not medically stable to d/c.   DVT prophylaxis: Enoxaparin   Code Status:   full  Family Communication:  No family at the bedside      Nutrition Status: Nutrition Problem: Increased nutrient needs Etiology: wound healing Signs/Symptoms: estimated needs Interventions: Ensure Enlive (each supplement provides 350kcal and 20 grams of protein), MVI, Magic cup     Skin Documentation: Pressure Injury 12/05/19 Sacrum Medial Stage 2 -  Partial thickness loss of dermis presenting as a shallow open injury with a red, pink  wound bed without slough. (Active)  12/05/19 1400  Location: Sacrum  Location Orientation: Medial  Staging: Stage 2 -  Partial thickness loss of dermis presenting as a shallow open injury with a red, pink wound bed without slough.  Wound Description (Comments):   Present on Admission: Yes     Pressure Injury 12/05/19 Buttocks Right Stage 3 -  Full thickness tissue loss. Subcutaneous fat may be visible but bone, tendon or muscle are NOT exposed. (Active)  12/05/19 1400  Location: Buttocks  Location Orientation: Right  Staging: Stage 3 -  Full thickness  tissue loss. Subcutaneous fat may be visible but bone, tendon or muscle are NOT exposed.  Wound Description (Comments):   Present on Admission: Yes     Pressure Injury 12/05/19 Buttocks Left;Upper Deep Tissue Pressure Injury - Purple or maroon localized area of discolored intact skin or blood-filled blister due to damage of underlying soft tissue from pressure and/or shear. (Active)  12/05/19 1400  Location: Buttocks  Location Orientation: Left;Upper  Staging: Deep Tissue Pressure Injury - Purple or maroon localized area of discolored intact skin or blood-filled blister due to damage of underlying soft tissue from pressure and/or shear.  Wound Description (Comments):   Present on Admission: Yes        Subjective: Denies any acute complaint.  Continuously watching TV well mentoring him.  Denies any acute complaint no nausea no vomiting.  No fever no chills.  No chest pain.  On examination of the leg reports pain there.    Objective: Vitals:   12/06/19 1003 12/06/19 1203 12/06/19 1633 12/06/19 1800  BP: (!) 140/96  (!) 138/97   Pulse: 96  92   Resp: 20  20 18   Temp: 98.5 F (36.9 C)  98.2 F (36.8 C)   TempSrc:      SpO2: 100% 99% 98% 98%  Weight:      Height:        Intake/Output Summary (Last 24 hours) at 12/06/2019 1911 Last data filed at 12/06/2019 1800 Gross per 24 hour  Intake 2365 ml  Output 1200 ml  Net 1165 ml   Filed Weights   12/04/19 1932 12/06/19 0444  Weight: 91.2 kg 102.7 kg    Examination:   General: Not in pain or dyspnea, deconditioned and ill looking appearing  Neurology: awake and alert, non focal E ENT: mild pallor, no icterus, oral mucosa moist Cardiovascular: No JVD. S1-S2 present, rhythmic, no gallops, rubs, or murmurs. No lower extremity edema. Pulmonary: positive breath sounds bilaterally, adequate air movement, no wheezing, rhonchi or rales. Gastrointestinal. Abdomen with no organomegaly, non tender, no rebound or guarding Skin. No  rashes Musculoskeletal: no joint deformities           Data Reviewed: I have personally reviewed following labs and imaging studies  CBC: Recent Labs  Lab 12/04/19 1950 12/05/19 0041  WBC 11.5* 9.9  NEUTROABS 7.8*  --   HGB 15.1 13.6  HCT 44.0 39.7  MCV 102.8* 102.1*  PLT 252 253   Basic Metabolic Panel: Recent Labs  Lab 12/04/19 1949 12/05/19 0040 12/06/19 0407  NA 124* 123* 131*  K 3.7 3.0* 3.3*  CL 86* 88* 96*  CO2 26 23 27   GLUCOSE 107* 106* 108*  BUN 14 11 6   CREATININE 0.86 0.69 0.59*  CALCIUM 8.8* 8.1* 8.2*  MG  --  1.7 1.9  PHOS  --  3.0 3.2   GFR: Estimated Creatinine Clearance: 123.3 mL/min (A) (by C-G formula based on SCr  of 0.59 mg/dL (L)). Liver Function Tests: Recent Labs  Lab 12/04/19 1949 12/05/19 0040  AST 186* 165*  ALT 122* 108*  ALKPHOS 67 53  BILITOT 1.8* 1.3*  PROT 7.4 6.5  ALBUMIN 2.4* 2.1*   No results for input(s): LIPASE, AMYLASE in the last 168 hours. No results for input(s): AMMONIA in the last 168 hours. Coagulation Profile: Recent Labs  Lab 12/04/19 1949  INR 1.2   Cardiac Enzymes: Recent Labs  Lab 12/04/19 1949  CKTOTAL 1,429*   BNP (last 3 results) No results for input(s): PROBNP in the last 8760 hours. HbA1C: No results for input(s): HGBA1C in the last 72 hours. CBG: No results for input(s): GLUCAP in the last 168 hours. Lipid Profile: No results for input(s): CHOL, HDL, LDLCALC, TRIG, CHOLHDL, LDLDIRECT in the last 72 hours. Thyroid Function Tests: No results for input(s): TSH, T4TOTAL, FREET4, T3FREE, THYROIDAB in the last 72 hours. Anemia Panel: Recent Labs    12/06/19 0407  VITAMINB12 716      Radiology Studies: I have reviewed all of the imaging during this hospital visit personally     Scheduled Meds:  enoxaparin (LOVENOX) injection  40 mg Subcutaneous Q24H   feeding supplement (ENSURE ENLIVE)  237 mL Oral TID BM   folic acid  1 mg Oral Daily   multivitamin with minerals  1  tablet Oral Daily   thiamine  100 mg Oral Daily   Zinc Oxide   Topical BID   Continuous Infusions:    LOS: 1 day        Lynden Oxford, MD

## 2019-12-06 NOTE — Progress Notes (Signed)
Initial Nutrition Assessment  DOCUMENTATION CODES:   Obesity unspecified  INTERVENTION:   -MVI with minerals daily -Ensure Enlive po TID, each supplement provides 350 kcal and 20 grams of protein -Magic cup TID with meals, each supplement provides 290 kcal and 9 grams of protein  NUTRITION DIAGNOSIS:   Increased nutrient needs related to wound healing as evidenced by estimated needs.  GOAL:   Patient will meet greater than or equal to 90% of their needs  MONITOR:   PO intake, Supplement acceptance, Labs, Weight trends, Skin, I & O's  REASON FOR ASSESSMENT:   Consult Assessment of nutrition requirement/status  ASSESSMENT:   54 year old male who presented with generalized weakness.  He does have significant past medical history of alcohol abuse, COPD, and history of CVA  Pt admitted with symptomatic hyponatremia/hypokalemia/ hypomagnesemia.   Reviewed I/O's: -1.2 L x 24 hours  UOP: 700 ml x 24 hours  Spoke with pt at bedside, who had flat affect at time of visit. He reports he has a great appetite and consumed all of his breakfast and dinner (noted meal completion 25-75%). Pt shares that he did not eat anything for about 3-4 days prior to admission. Attempted to obtain more information regarding diet history PTA, however, pt did not give many details despite probing. Pt appeared unkempt (greay hair, dirt under fingernails), however, pt did not provide RD with much insight to his home situation.   Pt is unsure of UBW or if he has lost weight. Reviewed wt hx; pt has experienced a 4% wt loss over the past 3 months, which is not significant for time frame.   Per CWOCN note, pt with stage 2 pressure injury to sacrum, stage 3 pressure injury rt buttock, DPTI to lt upper buttock, LLE full thickness ulcer, discoloration to rt inner thigh, trauma wound to rt knee, MASD to bilateral buttocks, and partial thickness skin loss of posterior thighs.   Discussed importance of good meal and  supplement intake to promote healing. Pt amenable to trying oral nutrition supplements. Also provided pt with cola soft drink per his request.   Medications reviewed and include folvite, MVI, and thiamine.   Labs reviewed: Na: 131, K; 3.3.   NUTRITION - FOCUSED PHYSICAL EXAM:    Most Recent Value  Orbital Region No depletion  Upper Arm Region No depletion  Thoracic and Lumbar Region No depletion  Buccal Region No depletion  Temple Region No depletion  Clavicle Bone Region No depletion  Clavicle and Acromion Bone Region No depletion  Scapular Bone Region No depletion  Dorsal Hand No depletion  Patellar Region No depletion  Anterior Thigh Region No depletion  Posterior Calf Region No depletion  Edema (RD Assessment) Moderate  Hair Reviewed  Eyes Reviewed  Mouth Reviewed  Skin Reviewed  Nails Reviewed       Diet Order:   Diet Order            Diet regular Room service appropriate? Yes; Fluid consistency: Thin  Diet effective now                 EDUCATION NEEDS:   Education needs have been addressed  Skin:  Skin Assessment: Skin Integrity Issues: Skin Integrity Issues:: Stage II, Stage III, DTI, Other (Comment) DTI: lt upper buttock Stage II: sacrum Stage III: rt buttock Other: LLE full thickness ulcer; trauma to rt knee; MASD to bilateral buttocks; partial thickness skin loss to posterior thighs  Last BM:  Unknown  Height:   Ht  Readings from Last 1 Encounters:  12/05/19 5\' 7"  (1.702 m)    Weight:   Wt Readings from Last 1 Encounters:  12/06/19 102.7 kg    Ideal Body Weight:  67.3 kg  BMI:  Body mass index is 35.46 kg/m.  Estimated Nutritional Needs:   Kcal:  12/08/19  Protein:  120-135 grams  Fluid:  > 2.3 L    2010-0712, RD, LDN, CDCES Registered Dietitian II Certified Diabetes Care and Education Specialist Please refer to [facility] for RD and/or RD on-call/weekend/after hours pager

## 2019-12-07 ENCOUNTER — Inpatient Hospital Stay (HOSPITAL_COMMUNITY): Payer: Self-pay

## 2019-12-07 DIAGNOSIS — R609 Edema, unspecified: Secondary | ICD-10-CM

## 2019-12-07 LAB — CBC WITH DIFFERENTIAL/PLATELET
Abs Immature Granulocytes: 0.15 10*3/uL — ABNORMAL HIGH (ref 0.00–0.07)
Basophils Absolute: 0.1 10*3/uL (ref 0.0–0.1)
Basophils Relative: 1 %
Eosinophils Absolute: 0.2 10*3/uL (ref 0.0–0.5)
Eosinophils Relative: 2 %
HCT: 39.2 % (ref 39.0–52.0)
Hemoglobin: 13 g/dL (ref 13.0–17.0)
Immature Granulocytes: 2 %
Lymphocytes Relative: 17 %
Lymphs Abs: 1.6 10*3/uL (ref 0.7–4.0)
MCH: 34.7 pg — ABNORMAL HIGH (ref 26.0–34.0)
MCHC: 33.2 g/dL (ref 30.0–36.0)
MCV: 104.5 fL — ABNORMAL HIGH (ref 80.0–100.0)
Monocytes Absolute: 1.5 10*3/uL — ABNORMAL HIGH (ref 0.1–1.0)
Monocytes Relative: 16 %
Neutro Abs: 6 10*3/uL (ref 1.7–7.7)
Neutrophils Relative %: 62 %
Platelets: 362 10*3/uL (ref 150–400)
RBC: 3.75 MIL/uL — ABNORMAL LOW (ref 4.22–5.81)
RDW: 12.6 % (ref 11.5–15.5)
WBC: 9.6 10*3/uL (ref 4.0–10.5)
nRBC: 0 % (ref 0.0–0.2)

## 2019-12-07 LAB — AMMONIA: Ammonia: 36 umol/L — ABNORMAL HIGH (ref 9–35)

## 2019-12-07 LAB — COMPREHENSIVE METABOLIC PANEL
ALT: 79 U/L — ABNORMAL HIGH (ref 0–44)
AST: 80 U/L — ABNORMAL HIGH (ref 15–41)
Albumin: 2 g/dL — ABNORMAL LOW (ref 3.5–5.0)
Alkaline Phosphatase: 53 U/L (ref 38–126)
Anion gap: 7 (ref 5–15)
BUN: 5 mg/dL — ABNORMAL LOW (ref 6–20)
CO2: 27 mmol/L (ref 22–32)
Calcium: 8.4 mg/dL — ABNORMAL LOW (ref 8.9–10.3)
Chloride: 97 mmol/L — ABNORMAL LOW (ref 98–111)
Creatinine, Ser: 0.61 mg/dL (ref 0.61–1.24)
GFR calc Af Amer: >60 mL/min (ref >60)
GFR calc non Af Amer: >60 mL/min (ref >60)
Glucose, Bld: 107 mg/dL — ABNORMAL HIGH (ref 70–99)
Potassium: 4 mmol/L (ref 3.5–5.1)
Sodium: 131 mmol/L — ABNORMAL LOW (ref 135–145)
Total Bilirubin: 0.7 mg/dL (ref 0.3–1.2)
Total Protein: 6.9 g/dL (ref 6.5–8.1)

## 2019-12-07 LAB — SEDIMENTATION RATE: Sed Rate: 72 mm/hr — ABNORMAL HIGH (ref 0–16)

## 2019-12-07 LAB — URINE CULTURE: Culture: 50000 — AB

## 2019-12-07 LAB — TSH: TSH: 2.84 u[IU]/mL (ref 0.350–4.500)

## 2019-12-07 LAB — T4, FREE: Free T4: 1.43 ng/dL — ABNORMAL HIGH (ref 0.61–1.12)

## 2019-12-07 LAB — CK: Total CK: 167 U/L (ref 49–397)

## 2019-12-07 LAB — C-REACTIVE PROTEIN: CRP: 9.6 mg/dL — ABNORMAL HIGH (ref <1.0)

## 2019-12-07 LAB — MAGNESIUM: Magnesium: 1.6 mg/dL — ABNORMAL LOW (ref 1.7–2.4)

## 2019-12-07 MED ORDER — MAGNESIUM SULFATE 2 GM/50ML IV SOLN
2.0000 g | Freq: Once | INTRAVENOUS | Status: AC
  Administered 2019-12-07: 2 g via INTRAVENOUS
  Filled 2019-12-07: qty 50

## 2019-12-07 MED ORDER — SENNOSIDES-DOCUSATE SODIUM 8.6-50 MG PO TABS: 1.0000 | ORAL_TABLET | Freq: Two times a day (BID) | ORAL | Status: DC

## 2019-12-07 MED ORDER — LACTULOSE 10 GM/15ML PO SOLN
20.0000 g | Freq: Every day | ORAL | Status: DC
  Administered 2019-12-07 – 2019-12-12 (×6): 20 g via ORAL
  Filled 2019-12-07 (×6): qty 30

## 2019-12-07 MED ORDER — POLYETHYLENE GLYCOL 3350 17 G PO PACK: 17.0000 g | PACK | Freq: Every day | ORAL | Status: DC

## 2019-12-07 MED ORDER — METOPROLOL TARTRATE 25 MG PO TABS
25.0000 mg | ORAL_TABLET | Freq: Two times a day (BID) | ORAL | Status: DC
  Administered 2019-12-07 – 2019-12-12 (×11): 25 mg via ORAL
  Filled 2019-12-07 (×11): qty 1

## 2019-12-07 MED ORDER — NICOTINE 14 MG/24HR TD PT24
14.0000 mg | MEDICATED_PATCH | Freq: Every day | TRANSDERMAL | Status: DC
  Administered 2019-12-07 – 2019-12-12 (×6): 14 mg via TRANSDERMAL
  Filled 2019-12-07 (×6): qty 1

## 2019-12-07 NOTE — Plan of Care (Signed)
  Problem: Education: Goal: Knowledge of General Education information will improve Description Including pain rating scale, medication(s)/side effects and non-pharmacologic comfort measures Outcome: Progressing   

## 2019-12-07 NOTE — TOC Initial Note (Signed)
Transition of Care [facility]) - Initial/Assessment Note    Patient Details  Name: Robert Frank MRN: [id number] Date of Birth: June 07, 1966  [facility] CM/SW Contact:    Kingsley Plan, RN Phone Number: 12/07/2019, 12:13 PM  Clinical Narrative:                    Spoke with patient at bedside. Patient from home with brother. Patient states his brother is unable to assist him at home. ( Brother was just discharge from hospital).  PT scheduled to work with patient today. Last PT note patient required 2 person max assist.   Patient has no insurance cannot afford private pay SNF.   Will see how patient does with PT today and discuss with Lead TOC      Patient Goals and CMS Choice        Expected Discharge Plan and Services     Discharge Planning Services: CM Consult   Living arrangements for the past 2 months: Single Family Home                                      Prior Living Arrangements/Services Living arrangements for the past 2 months: Single Family Home Lives with:: Siblings Patient language and need for interpreter reviewed:: Yes Do you feel safe going back to the place where you live?: Yes      Need for Family Participation in Patient Care: Yes (Comment) Care giver support system in place?: No (comment) Current home services: DME Criminal Activity/Legal Involvement Pertinent to Current Situation/Hospitalization: No - Comment as needed  Activities of Daily Living      Permission Sought/Granted   Permission granted to share information with : No              Emotional Assessment Appearance:: Appears stated age     Orientation: : Oriented to Self, Oriented to Place, Oriented to  Time, Oriented to Situation Alcohol / Substance Use: Not Applicable Psych Involvement: No (comment)  Admission diagnosis:  Hypochloremia [E87.8] Hyponatremia [E87.1] Elevated LFTs [R79.89] Elevated CK [R74.8] SIRS (systemic inflammatory response  syndrome) (HCC) [R65.10] Generalized weakness [R53.1] Patient Active Problem List   Diagnosis Date Noted  . Hypokalemia 12/05/2019  . Hypomagnesemia 12/05/2019  . Generalized weakness 12/04/2019  . Transaminitis 12/04/2019  . Decubitus skin ulcer 12/04/2019  . Hyponatremia 12/04/2019  . Alcohol withdrawal (HCC) 08/17/2019  . Encephalopathy acute 08/17/2019  . Alcoholism (HCC)   . Cellulitis   . COPD exacerbation (HCC) 10/07/2018  . COPD (chronic obstructive pulmonary disease) (HCC) 10/06/2018  . COPD with acute exacerbation (HCC) 10/06/2018   PCP:  Patient, No Pcp Per Pharmacy:   [id number] [place], [place] - [address] AT [facility] OF [address] [place] [address] Phone: [phone] Fax: [phone]  [facility] [place], [place] - [address] [place] [address] Phone: [phone] Fax: [phone]     Social Determinants of Health (SDOH) Interventions    Readmission Risk Interventions No flowsheet data found.

## 2019-12-07 NOTE — Progress Notes (Signed)
PROGRESS NOTE    Robert Frank  LFY:[id number] DOB: Oct 15, 1965 DOA: 12/04/2019 PCP: Patient, No Pcp Per    Brief Narrative:  Patient admitted to the hospital working diagnosis of generalized weakness in the setting of hyponatremia and alcohol abuse.  54 year old male who presented with generalized weakness.  He does have significant past medical history of alcohol abuse, COPD, and history of CVA.  Patient had several weeks of generalized weakness to the point where he could not get out of his bed.  On the day of admission he was found down on the floor, apparently laying there for 3 days unable to stand up.  On his initial physical examination blood pressure 141/99, heart rate 118, respiratory rate 18, temperature 98.3, oxygen saturation 98.3%.  He had moist mucous membranes, his lungs were clear to auscultation bilaterally, heart S1-S2, present, tachycardic, abdomen soft, positive lower extremity edema.  Patient had decreased strength 4 out of 5 all 4 extremities.  Flat affect.    Assessment & Plan:   Principal Problem:   Hyponatremia Active Problems:   COPD (chronic obstructive pulmonary disease) (HCC)   Alcoholism (HCC)   Generalized weakness   Transaminitis   Decubitus skin ulcer   Hypokalemia   Hypomagnesemia   1. Symptomatic hyponatremia/ hypokalemia/ hypomagensemia. Patient with persistent hyponatremia, hypokalemia and hypomagnesemia. Na 123, K 3,0, Mg 1.7. Renal function with serum cr at 0,69 and serum bicarbonate at 23 to 26.  Possible hypovolemic due to poor oral intake.  Now sodium and volume corrected. Liberate diet and follow with nutrition recommendations, patient will need Pt and Ot.   2. Etho abuse. Patient with chronic alcohol use and abuse. No clinical signs of acute withdrawal. Will continue with as needed lorazepam, will hold on CIWA protocol for now.   Continue with thiamine, and multivitamins. Check B12.   3. Pretibial pressure ulcer stage 2. Present on  admission, continue with local wound care.   4. Elevated liver enzymes. AST 165 and ALT 108. T Bil 1,3. Liver US with steatosis. Will follow with chronic hepatitis panel  5.  Cellulitis of the leg. Continue Augmentin. Check CK. Lower extremity Doppler negative for DVT.  6.  Acute hypoxic respiratory failure Chest x-ray shows bilateral atelectasis. Patient is on 3 L of oxygen right now. Do not think the patient will be able to take incentive spirometry.  Monitor.   Status is: Inpatient  Dispo: The patient is from: Home              Anticipated d/c is to: SN versus home with 24-hour supervision F              Anticipated d/c date is: 1 to 2 days              Patient currently is not medically stable to d/c.   DVT prophylaxis: Enoxaparin   Code Status:   full  Family Communication:  No family at the bedside      Nutrition Status: Nutrition Problem: Increased nutrient needs Etiology: wound healing Signs/Symptoms: estimated needs Interventions: Ensure Enlive (each supplement provides 350kcal and 20 grams of protein), MVI, Magic cup     Skin Documentation: Pressure Injury 12/05/19 Sacrum Medial Stage 2 -  Partial thickness loss of dermis presenting as a shallow open injury with a red, pink wound bed without slough. (Active)  12/05/19 1400  Location: Sacrum  Location Orientation: Medial  Staging: Stage 2 -  Partial thickness loss of dermis presenting as a shallow open  injury with a red, pink wound bed without slough.  Wound Description (Comments):   Present on Admission: Yes     Pressure Injury 12/05/19 Buttocks Right Stage 3 -  Full thickness tissue loss. Subcutaneous fat may be visible but bone, tendon or muscle are NOT exposed. (Active)  12/05/19 1400  Location: Buttocks  Location Orientation: Right  Staging: Stage 3 -  Full thickness tissue loss. Subcutaneous fat may be visible but bone, tendon or muscle are NOT exposed.  Wound Description (Comments):   Present on  Admission: Yes     Pressure Injury 12/05/19 Buttocks Left;Upper Deep Tissue Pressure Injury - Purple or maroon localized area of discolored intact skin or blood-filled blister due to damage of underlying soft tissue from pressure and/or shear. (Active)  12/05/19 1400  Location: Buttocks  Location Orientation: Left;Upper  Staging: Deep Tissue Pressure Injury - Purple or maroon localized area of discolored intact skin or blood-filled blister due to damage of underlying soft tissue from pressure and/or shear.  Wound Description (Comments):   Present on Admission: Yes        Subjective: Reports pain in the leg.  No nausea no vomiting.  No fever no chills.  No chest pain.  Objective: Vitals:   12/07/19 0437 12/07/19 0634 12/07/19 0652 12/07/19 0838  BP: (!) 171/103 (!) 176/109 (!) 155/102 (!) 148/90  Pulse: 98 99 94 88  Resp: 19   20  Temp: 98.1 F (36.7 C)   98 F (36.7 C)  TempSrc:    Oral  SpO2: 99%   97%  Weight: 104.7 kg     Height:        Intake/Output Summary (Last 24 hours) at 12/07/2019 1947 Last data filed at 12/07/2019 1842 Gross per 24 hour  Intake 1180 ml  Output 3650 ml  Net -2470 ml   Filed Weights   12/04/19 1932 12/06/19 0444 12/07/19 0437  Weight: 91.2 kg 102.7 kg 104.7 kg    Examination:   General: Not in pain or dyspnea, deconditioned and ill looking appearing  Neurology: awake and alert, non focal E ENT: mild pallor, no icterus, oral mucosa moist Cardiovascular: No JVD. S1-S2 present, rhythmic, no gallops, rubs, or murmurs.  Bilateral lower extremity edema with tenderness Pulmonary: positive breath sounds bilaterally, adequate air movement, no wheezing, rhonchi or rales. Gastrointestinal. Abdomen with no organomegaly, non tender, no rebound or guarding Skin. No rashes Musculoskeletal: no joint deformities  Data Reviewed: I have personally reviewed following labs and imaging studies  CBC: Recent Labs  Lab 12/04/19 1950 12/05/19 0041  12/07/19 0637  WBC 11.5* 9.9 9.6  NEUTROABS 7.8*  --  6.0  HGB 15.1 13.6 13.0  HCT 44.0 39.7 39.2  MCV 102.8* 102.1* 104.5*  PLT 252 253 362   Basic Metabolic Panel: Recent Labs  Lab 12/04/19 1949 12/05/19 0040 12/06/19 0407 12/07/19 0637  NA 124* 123* 131* 131*  K 3.7 3.0* 3.3* 4.0  CL 86* 88* 96* 97*  CO2 26 23 27 27   GLUCOSE 107* 106* 108* 107*  BUN 14 11 6  5*  CREATININE 0.86 0.69 0.59* 0.61  CALCIUM 8.8* 8.1* 8.2* 8.4*  MG  --  1.7 1.9 1.6*  PHOS  --  3.0 3.2  --    GFR: Estimated Creatinine Clearance: 124.5 mL/min (by C-G formula based on SCr of 0.61 mg/dL). Liver Function Tests: Recent Labs  Lab 12/04/19 1949 12/05/19 0040 12/07/19 0637  AST 186* 165* 80*  ALT 122* 108* 79*  ALKPHOS 67 53  53  BILITOT 1.8* 1.3* 0.7  PROT 7.4 6.5 6.9  ALBUMIN 2.4* 2.1* 2.0*   No results for input(s): LIPASE, AMYLASE in the last 168 hours. Recent Labs  Lab 12/07/19 0637  AMMONIA 36*   Coagulation Profile: Recent Labs  Lab 12/04/19 1949  INR 1.2   Cardiac Enzymes: Recent Labs  Lab 12/04/19 1949  CKTOTAL 1,429*   BNP (last 3 results) No results for input(s): PROBNP in the last 8760 hours. HbA1C: No results for input(s): HGBA1C in the last 72 hours. CBG: No results for input(s): GLUCAP in the last 168 hours. Lipid Profile: No results for input(s): CHOL, HDL, LDLCALC, TRIG, CHOLHDL, LDLDIRECT in the last 72 hours. Thyroid Function Tests: Recent Labs    12/07/19 0637  TSH 2.840  FREET4 1.43*   Anemia Panel: Recent Labs    12/06/19 0407  VITAMINB12 716      Radiology Studies: I have reviewed all of the imaging during this hospital visit personally     Scheduled Meds: . amoxicillin-clavulanate  1 tablet Oral Q12H  . enoxaparin (LOVENOX) injection  40 mg Subcutaneous Q24H  . feeding supplement (ENSURE ENLIVE)  237 mL Oral TID BM  . folic acid  1 mg Oral Daily  . lactulose  20 g Oral Daily  . metoprolol tartrate  25 mg Oral BID  .  multivitamin with minerals  1 tablet Oral Daily  . nicotine  14 mg Transdermal Daily  . thiamine  100 mg Oral Daily  . Zinc Oxide   Topical BID   Continuous Infusions:    LOS: 2 days        Lynden Oxford, MD

## 2019-12-07 NOTE — Progress Notes (Signed)
Physical Therapy Treatment Patient Details Name: Robert Frank MRN: [id number] DOB: Sep 21, 1965 Today's Date: 12/07/2019    History of Present Illness Pt is a 54 year old man admitted after being found down x 3 days. + hyponatremia, cellulitis B LEs. PMH: ETOH abuse, tobacco abuse, COPD, CVA with residual R side numbness, blindness in L eye.    PT Comments    Pt making slow, steady progress with functional mobility as indicated by needing less physical assistance with bed mobility and transfers this session. However, he remains very limited overall secondary to weakness and pain, and he continues to require two person physical assistance for sit<>stand and stand-pivot transfers. Pt would continue to benefit from skilled physical therapy services at this time while admitted and after d/c to address the below listed limitations in order to improve overall safety and independence with functional mobility.    Follow Up Recommendations  SNF;Supervision/Assistance - 24 hour     Equipment Recommendations  Other (comment) (defer to next venue of care)    Recommendations for Other Services       Precautions / Restrictions Precautions Precautions: Fall Precaution Comments: multiple wounds on buttocks and LEs Restrictions Weight Bearing Restrictions: No    Mobility  Bed Mobility Overal bed mobility: Needs Assistance Bed Mobility: Supine to Sit     Supine to sit: Min assist     General bed mobility comments: increased time and effort, cueing for sequencing, use of bed rails, min A for trunk elevation  Transfers Overall transfer level: Needs assistance Equipment used: Rolling walker (2 wheeled) Transfers: Sit to/from [employer] Sit to Stand: Min assist;+2 physical assistance Stand pivot transfers: Mod assist;+2 physical assistance       General transfer comment: cueing for safe hand placement, assistance for stability and power into standing, heavier assist  needed for pivotal movement to chair towards his L side with very short, shuffling steps  Ambulation/Gait                 Stairs             Wheelchair Mobility    Modified Rankin (Stroke Patients Only)       Balance Overall balance assessment: Needs assistance Sitting-balance support: No upper extremity supported Sitting balance-Leahy Scale: Fair     Standing balance support: Bilateral upper extremity supported Standing balance-Leahy Scale: Poor                              Cognition Arousal/Alertness: Awake/alert Behavior During Therapy: Flat affect Overall Cognitive Status: Impaired/Different from baseline Area of Impairment: Following commands;Problem solving;Memory                     Memory: Decreased short-term memory Following Commands: Follows one step commands with increased time     Problem Solving: Slow processing;Decreased initiation;Difficulty sequencing;Requires verbal cues;Requires tactile cues        Exercises      General Comments        Pertinent Vitals/Pain Pain Assessment: Faces Faces Pain Scale: Hurts little more Pain Location: B LEs with movement/touch Pain Descriptors / Indicators: Grimacing;Guarding;Moaning Pain Intervention(s): Monitored during session;Repositioned    Home Living                      Prior Function            PT Goals (current goals can now be found in  the care plan section) Acute Rehab PT Goals PT Goal Formulation: With patient Time For Goal Achievement: 12/19/19 Potential to Achieve Goals: Good Progress towards PT goals: Progressing toward goals    Frequency    Min 2X/week      PT Plan Current plan remains appropriate    Co-evaluation              AM-PAC PT "6 Clicks" Mobility   Outcome Measure  Help needed turning from your back to your side while in a flat bed without using bedrails?: None Help needed moving from lying on your back to sitting  on the side of a flat bed without using bedrails?: A Little Help needed moving to and from a bed to a chair (including a wheelchair)?: A Lot Help needed standing up from a chair using your arms (e.g., wheelchair or bedside chair)?: A Little Help needed to walk in hospital room?: A Lot Help needed climbing 3-5 steps with a railing? : Total 6 Click Score: 15    End of Session Equipment Utilized During Treatment: Gait belt Activity Tolerance: Patient limited by pain Patient left: in chair;with call bell/phone within reach;with chair alarm set Nurse Communication: Mobility status;Need for lift equipment PT Visit Diagnosis: Pain;Other abnormalities of gait and mobility (R26.89);Muscle weakness (generalized) (M62.81) Pain - Right/Left:  (bilateral) Pain - part of body: Leg     Time: 1300-1318 PT Time Calculation (min) (ACUTE ONLY): 18 min  Charges:  $Therapeutic Activity: 8-22 mins                     Arletta Bale, DPT  Acute Rehabilitation Services Pager [phone] Office [phone]     Robert Frank 12/07/2019, 3:16 PM

## 2019-12-08 LAB — BASIC METABOLIC PANEL
Anion gap: 7 (ref 5–15)
BUN: 8 mg/dL (ref 6–20)
CO2: 27 mmol/L (ref 22–32)
Calcium: 8.3 mg/dL — ABNORMAL LOW (ref 8.9–10.3)
Chloride: 94 mmol/L — ABNORMAL LOW (ref 98–111)
Creatinine, Ser: 0.73 mg/dL (ref 0.61–1.24)
GFR calc Af Amer: >60 mL/min (ref >60)
GFR calc non Af Amer: >60 mL/min (ref >60)
Glucose, Bld: 110 mg/dL — ABNORMAL HIGH (ref 70–99)
Potassium: 4 mmol/L (ref 3.5–5.1)
Sodium: 128 mmol/L — ABNORMAL LOW (ref 135–145)

## 2019-12-08 LAB — CBC
HCT: 39.3 % (ref 39.0–52.0)
Hemoglobin: 13.1 g/dL (ref 13.0–17.0)
MCH: 34.8 pg — ABNORMAL HIGH (ref 26.0–34.0)
MCHC: 33.3 g/dL (ref 30.0–36.0)
MCV: 104.5 fL — ABNORMAL HIGH (ref 80.0–100.0)
Platelets: 375 10*3/uL (ref 150–400)
RBC: 3.76 MIL/uL — ABNORMAL LOW (ref 4.22–5.81)
RDW: 12.5 % (ref 11.5–15.5)
WBC: 9.6 10*3/uL (ref 4.0–10.5)
nRBC: 0 % (ref 0.0–0.2)

## 2019-12-08 MED ORDER — GABAPENTIN 100 MG PO CAPS
100.0000 mg | ORAL_CAPSULE | Freq: Two times a day (BID) | ORAL | Status: DC
  Administered 2019-12-08 – 2019-12-10 (×5): 100 mg via ORAL
  Filled 2019-12-08 (×5): qty 1

## 2019-12-08 MED ORDER — JUVEN PO PACK
1.0000 | PACK | Freq: Two times a day (BID) | ORAL | Status: DC
  Administered 2019-12-08 – 2019-12-12 (×8): 1 via ORAL
  Filled 2019-12-08 (×8): qty 1

## 2019-12-08 MED ORDER — FUROSEMIDE 10 MG/ML IJ SOLN
20.0000 mg | Freq: Two times a day (BID) | INTRAMUSCULAR | Status: DC
  Administered 2019-12-08: 20 mg via INTRAVENOUS
  Filled 2019-12-08: qty 2

## 2019-12-08 NOTE — Progress Notes (Signed)
PROGRESS NOTE    Robert Frank  FUX:[id number] DOB: 1965-08-19 DOA: 12/04/2019 PCP: Patient, No Pcp Per    Brief Narrative:  Patient admitted to the hospital working diagnosis of generalized weakness in the setting of hyponatremia and alcohol abuse.  54 year old male who presented with generalized weakness.  He does have significant past medical history of alcohol abuse, COPD, and history of CVA.  Patient had several weeks of generalized weakness to the point where he could not get out of his bed.  On the day of admission he was found down on the floor, apparently laying there for 3 days unable to stand up.  On his initial physical examination blood pressure 141/99, heart rate 118, respiratory rate 18, temperature 98.3, oxygen saturation 98.3%.  He had moist mucous membranes, his lungs were clear to auscultation bilaterally, heart S1-S2, present, tachycardic, abdomen soft, positive lower extremity edema.  Patient had decreased strength 4 out of 5 all 4 extremities.  Flat affect.    Assessment & Plan:   Principal Problem:   Hyponatremia Active Problems:   COPD (chronic obstructive pulmonary disease) (HCC)   Alcoholism (HCC)   Generalized weakness   Transaminitis   Decubitus skin ulcer   Hypokalemia   Hypomagnesemia   1. Symptomatic hyponatremia/ hypokalemia/ hypomagensemia. Patient with persistent hyponatremia, hypokalemia and hypomagnesemia. Na 123, K 3,0, Mg 1.7. Renal function with serum cr at 0,69 and serum bicarbonate at 23 to 26.  Patient was initially given IV fluids currently appears volume overloaded.  Given IV Lasix. Monitor sodium. Liberate diet and follow with nutrition recommendations, patient will need Pt and Ot.   2. Etho abuse. Patient with chronic alcohol use and abuse. No clinical signs of acute withdrawal. Will continue with as needed lorazepam, will hold on CIWA protocol for now.   Continue with thiamine, and multivitamins. Check B12.   3. Pretibial  pressure ulcer stage 2. Present on admission, continue with local wound care.   4. Elevated liver enzymes. AST 165 and ALT 108. T Bil 1,3. Liver US with steatosis.  Negative hepatitis panel  5.  Cellulitis of the leg. Continue Augmentin. Check CK. Lower extremity Doppler negative for DVT. Add gabapentin for pain  6.  Acute hypoxic respiratory failure Chest x-ray shows bilateral atelectasis. Patient is on 3 L of oxygen right now. Do not think the patient will be able to take incentive spirometry.  Monitor.   Status is: Inpatient  Dispo: The patient is from: Home              Anticipated d/c is to: SN versus home with 24-hour supervision F              Anticipated d/c date is: 1 to 2 days              Patient currently is not medically stable to d/c.   DVT prophylaxis: Enoxaparin   Code Status:   full  Family Communication:  No family at the bedside      Nutrition Status: Nutrition Problem: Increased nutrient needs Etiology: wound healing Signs/Symptoms: estimated needs Interventions: Ensure Enlive (each supplement provides 350kcal and 20 grams of protein), MVI, Magic cup     Skin Documentation: Pressure Injury 12/05/19 Sacrum Medial Stage 2 -  Partial thickness loss of dermis presenting as a shallow open injury with a red, pink wound bed without slough. (Active)  12/05/19 1400  Location: Sacrum  Location Orientation: Medial  Staging: Stage 2 -  Partial thickness loss of dermis  presenting as a shallow open injury with a red, pink wound bed without slough.  Wound Description (Comments):   Present on Admission: Yes     Pressure Injury 12/05/19 Buttocks Right Stage 3 -  Full thickness tissue loss. Subcutaneous fat may be visible but bone, tendon or muscle are NOT exposed. (Active)  12/05/19 1400  Location: Buttocks  Location Orientation: Right  Staging: Stage 3 -  Full thickness tissue loss. Subcutaneous fat may be visible but bone, tendon or muscle are NOT exposed.    Wound Description (Comments):   Present on Admission: Yes     Pressure Injury 12/05/19 Buttocks Left;Upper Deep Tissue Pressure Injury - Purple or maroon localized area of discolored intact skin or blood-filled blister due to damage of underlying soft tissue from pressure and/or shear. (Active)  12/05/19 1400  Location: Buttocks  Location Orientation: Left;Upper  Staging: Deep Tissue Pressure Injury - Purple or maroon localized area of discolored intact skin or blood-filled blister due to damage of underlying soft tissue from pressure and/or shear.  Wound Description (Comments):   Present on Admission: Yes        Subjective: No nausea no vomiting no fever no chills.  No chest pain.  Continues to report pain in the leg.  Objective: Vitals:   12/07/19 2101 12/08/19 0451 12/08/19 0803 12/08/19 1620  BP: (!) 155/100 135/89 139/88 (!) 142/79  Pulse: 100 89 82 80  Resp: 18 19 18 16   Temp: 100.2 F (37.9 C) 98.4 F (36.9 C) 98 F (36.7 C) 97.6 F (36.4 C)  TempSrc: Oral  Oral Oral  SpO2: 98% 96% 98% 100%  Weight:  104.6 kg    Height:        Intake/Output Summary (Last 24 hours) at 12/08/2019 1927 Last data filed at 12/08/2019 1700 Gross per 24 hour  Intake 1257 ml  Output 7100 ml  Net -5843 ml   Filed Weights   12/06/19 0444 12/07/19 0437 12/08/19 0451  Weight: 102.7 kg 104.7 kg 104.6 kg    Examination:   General: Not in pain or dyspnea, deconditioned and ill looking appearing  Neurology: awake and alert, non focal E ENT: mild pallor, no icterus, oral mucosa moist Cardiovascular: No JVD. S1-S2 present, rhythmic, no gallops, rubs, or murmurs.  Bilateral lower extremity edema with tenderness Pulmonary: positive breath sounds bilaterally, adequate air movement, no wheezing, rhonchi or rales. Gastrointestinal. Abdomen with no organomegaly, non tender, no rebound or guarding Skin. No rashes Musculoskeletal: no joint deformities  Data Reviewed: I have personally reviewed  following labs and imaging studies  CBC: Recent Labs  Lab 12/04/19 1950 12/05/19 0041 12/07/19 0637 12/08/19 0426  WBC 11.5* 9.9 9.6 9.6  NEUTROABS 7.8*  --  6.0  --   HGB 15.1 13.6 13.0 13.1  HCT 44.0 39.7 39.2 39.3  MCV 102.8* 102.1* 104.5* 104.5*  PLT 252 253 362 375   Basic Metabolic Panel: Recent Labs  Lab 12/04/19 1949 12/05/19 0040 12/06/19 0407 12/07/19 0637 12/08/19 0426  NA 124* 123* 131* 131* 128*  K 3.7 3.0* 3.3* 4.0 4.0  CL 86* 88* 96* 97* 94*  CO2 26 23 27 27 27   GLUCOSE 107* 106* 108* 107* 110*  BUN 14 11 6  5* 8  CREATININE 0.86 0.69 0.59* 0.61 0.73  CALCIUM 8.8* 8.1* 8.2* 8.4* 8.3*  MG  --  1.7 1.9 1.6*  --   PHOS  --  3.0 3.2  --   --    GFR: Estimated Creatinine Clearance: 124.5  mL/min (by C-G formula based on SCr of 0.73 mg/dL). Liver Function Tests: Recent Labs  Lab 12/04/19 1949 12/05/19 0040 12/07/19 0637  AST 186* 165* 80*  ALT 122* 108* 79*  ALKPHOS 67 53 53  BILITOT 1.8* 1.3* 0.7  PROT 7.4 6.5 6.9  ALBUMIN 2.4* 2.1* 2.0*   No results for input(s): LIPASE, AMYLASE in the last 168 hours. Recent Labs  Lab 12/07/19 0637  AMMONIA 36*   Coagulation Profile: Recent Labs  Lab 12/04/19 1949  INR 1.2   Cardiac Enzymes: Recent Labs  Lab 12/04/19 1949 12/07/19 2054  CKTOTAL 1,429* 167   BNP (last 3 results) No results for input(s): PROBNP in the last 8760 hours. HbA1C: No results for input(s): HGBA1C in the last 72 hours. CBG: No results for input(s): GLUCAP in the last 168 hours. Lipid Profile: No results for input(s): CHOL, HDL, LDLCALC, TRIG, CHOLHDL, LDLDIRECT in the last 72 hours. Thyroid Function Tests: Recent Labs    12/07/19 0637  TSH 2.840  FREET4 1.43*   Anemia Panel: Recent Labs    12/06/19 0407  VITAMINB12 716      Radiology Studies: I have reviewed all of the imaging during this hospital visit personally     Scheduled Meds: . amoxicillin-clavulanate  1 tablet Oral Q12H  . enoxaparin  (LOVENOX) injection  40 mg Subcutaneous Q24H  . feeding supplement (ENSURE ENLIVE)  237 mL Oral TID BM  . folic acid  1 mg Oral Daily  . gabapentin  100 mg Oral BID  . lactulose  20 g Oral Daily  . metoprolol tartrate  25 mg Oral BID  . multivitamin with minerals  1 tablet Oral Daily  . nicotine  14 mg Transdermal Daily  . nutrition supplement (JUVEN)  1 packet Oral BID BM  . thiamine  100 mg Oral Daily  . Zinc Oxide   Topical BID   Continuous Infusions:    LOS: 3 days        Lynden Oxford, MD

## 2019-12-08 NOTE — Progress Notes (Signed)
Nutrition Follow-up  DOCUMENTATION CODES:   Obesity unspecified  INTERVENTION:  -1 packet Juven po BID, each packet provides 95 calories, 2.5 grams of protein (collagen), and 9.8 grams of carbohydrate (3 grams sugar); also contains 7 grams of L-arginine and L-glutamine, 300 mg vitamin C, 15 mg vitamin E, 1.2 mcg vitamin B-12, 9.5 mg zinc, 200 mg calcium, and 1.5 g  Calcium Beta-hydroxy-Beta-methylbutyrate to support wound healing -Continue MVI with minerals daily -Continue Ensure Enlive po TID, each supplement provides 350 kcal and 20 grams of protein -Continue Magic cup TID with meals, each supplement provides 290 kcal and 9 grams of protein   NUTRITION DIAGNOSIS:   Increased nutrient needs related to wound healing as evidenced by estimated needs.  Ongoing.  GOAL:   Patient will meet greater than or equal to 90% of their needs  Progressing.  MONITOR:   PO intake, Supplement acceptance, Labs, Weight trends, Skin, I & O's  REASON FOR ASSESSMENT:   Consult Assessment of nutrition requirement/status  ASSESSMENT:   54 year old male who presented with generalized weakness.  He does have significant past medical history of alcohol abuse, COPD, and history of CVA  PO intake has improved with pt consuming 0-100% of last 8 recorded meals (68% average meal intake). Pt also noted to be accepting Ensure Enlive. Pt with multiple wounds (see below). Will order Juven BID to aid in wound healing.   Pt noted to have BLE moderate pitting edema   UOP: x24 hours I/O: -3504.41ml since admit  Labs: Na 128 (L) Medications: Ensure Enlive TID, Folvite, Chronulac, MVI, Thiamine  Diet Order:   Diet Order            Diet regular Room service appropriate? Yes; Fluid consistency: Thin  Diet effective now                 EDUCATION NEEDS:   Education needs have been addressed  Skin:  Skin Assessment: Skin Integrity Issues: Skin Integrity Issues:: Stage II, Stage III, DTI, Other  (Comment) DTI: lt upper buttock Stage II: sacrum Stage III: rt buttock Other: LLE full thickness ulcer; trauma to rt knee; MASD to bilateral buttocks; partial thickness skin loss to posterior thighs  Last BM:  unknown  Height:   Ht Readings from Last 1 Encounters:  12/05/19 5\' 7"  (1.702 m)    Weight:   Wt Readings from Last 1 Encounters:  12/08/19 104.6 kg    Ideal Body Weight:  67.3 kg  BMI:  Body mass index is 36.12 kg/m.  Estimated Nutritional Needs:   Kcal:  02/08/20  Protein:  120-135 grams  Fluid:  > 2.3 L   [phone], MS, RD, LDN RD pager number and weekend/on-call pager number located in [place].

## 2019-12-08 NOTE — Progress Notes (Signed)
Occupational Therapy Treatment Patient Details Name: Robert Frank MRN: [id number] DOB: [birth year] Today's Date: 12/08/2019    History of present illness Pt is a 54 year old man admitted after being found down x 3 days. + hyponatremia, cellulitis B LEs. PMH: ETOH abuse, tobacco abuse, COPD, CVA with residual R side numbness, blindness in L eye.   OT comments  Patient supine in bed on arrival.  He was difficult to motivate today and refused OOB.  Did get to EOB with min assist and sat EOB for ~7 min.  Completed seated grooming with set up and supervision for seated balance.  Patient complaining of bilateral LE pain and back pain.  Encouraged patient to continue moving as much as possible.  Returned to supine with mod assist.  Will continue to follow with OT acutely to address the deficits listed below.    Follow Up Recommendations  SNF;Supervision/Assistance - 24 hour    Equipment Recommendations  3 in 1 bedside commode    Recommendations for Other Services      Precautions / Restrictions Precautions Precautions: Fall Precaution Comments: multiple wounds on buttocks and LEs Restrictions Weight Bearing Restrictions: No       Mobility Bed Mobility Overal bed mobility: Needs Assistance Bed Mobility: Supine to Sit;Sit to Supine     Supine to sit: Min assist Sit to supine: Mod assist   General bed mobility comments: Increased time and use of bed rails  Transfers                      Balance Overall balance assessment: Needs assistance Sitting-balance support: No upper extremity supported;Feet supported Sitting balance-Leahy Scale: Fair                                     ADL either performed or assessed with clinical judgement   ADL Overall ADL's : Needs assistance/impaired     Grooming: Wash/dry hands;Wash/dry face;Set up;Sitting                                 General ADL Comments: Patient declining most ADLs and OOB  activity due to pain.  Motivation seems low.  Attempted many ioptions and encouraged patient to participate though patient declining.     Vision       Perception     Praxis      Cognition Arousal/Alertness: Awake/alert Behavior During Therapy: Flat affect Overall Cognitive Status: Impaired/Different from baseline Area of Impairment: Following commands;Problem solving;Memory                     Memory: Decreased short-term memory Following Commands: Follows one step commands with increased time     Problem Solving: Slow processing;Decreased initiation;Difficulty sequencing;Requires verbal cues;Requires tactile cues General Comments: Delayed responding to commands        Exercises     Shoulder Instructions       General Comments      Pertinent Vitals/ Pain       Pain Assessment: 0-10 Pain Score: 7  Pain Location: B LEs with movement/touch, back Pain Descriptors / Indicators: Grimacing;Guarding;Moaning Pain Intervention(s): Limited activity within patient's tolerance;Monitored during session;Repositioned  Home Living  Prior Functioning/Environment              Frequency  Min 2X/week        Progress Toward Goals  OT Goals(current goals can now be found in the care plan section)  Progress towards OT goals: Progressing toward goals  Acute Rehab OT Goals Patient Stated Goal: to get stronger OT Goal Formulation: With patient Time For Goal Achievement: 12/19/19 Potential to Achieve Goals: Good  Plan Discharge plan remains appropriate    Co-evaluation                 AM-PAC OT "6 Clicks" Daily Activity     Outcome Measure   Help from another person eating meals?: A Little Help from another person taking care of personal grooming?: A Little Help from another person toileting, which includes using toliet, bedpan, or urinal?: Total Help from another person bathing (including  washing, rinsing, drying)?: A Lot Help from another person to put on and taking off regular upper body clothing?: A Lot Help from another person to put on and taking off regular lower body clothing?: Total 6 Click Score: 12    End of Session    OT Visit Diagnosis: Unsteadiness on feet (R26.81);Other abnormalities of gait and mobility (R26.89);History of falling (Z91.81);Muscle weakness (generalized) (M62.81);Pain;Other symptoms and signs involving cognitive function Pain - Right/Left:  (both) Pain - part of body: Leg   Activity Tolerance Patient limited by pain   Patient Left in bed;with call bell/phone within reach;with bed alarm set   Nurse Communication Mobility status        Time: 1100-1114 OT Time Calculation (min): 14 min  Charges: OT General Charges $OT Visit: 1 Visit OT Treatments $Therapeutic Activity: 8-22 mins  Barbie Banner, OTR/L   Adella Hare 12/08/2019, 1:54 PM

## 2019-12-09 LAB — BASIC METABOLIC PANEL
Anion gap: 8 (ref 5–15)
Anion gap: 4 — ABNORMAL LOW (ref 5–15)
BUN: 12 mg/dL (ref 6–20)
BUN: 18 mg/dL (ref 6–20)
CO2: 26 mmol/L (ref 22–32)
CO2: 32 mmol/L (ref 22–32)
Calcium: 8.4 mg/dL — ABNORMAL LOW (ref 8.9–10.3)
Calcium: 8.5 mg/dL — ABNORMAL LOW (ref 8.9–10.3)
Chloride: 93 mmol/L — ABNORMAL LOW (ref 98–111)
Chloride: 96 mmol/L — ABNORMAL LOW (ref 98–111)
Creatinine, Ser: 0.61 mg/dL (ref 0.61–1.24)
Creatinine, Ser: 0.95 mg/dL (ref 0.61–1.24)
GFR calc Af Amer: >60 mL/min (ref >60)
GFR calc Af Amer: >60 mL/min (ref >60)
GFR calc non Af Amer: >60 mL/min (ref >60)
GFR calc non Af Amer: >60 mL/min (ref >60)
Glucose, Bld: 105 mg/dL — ABNORMAL HIGH (ref 70–99)
Glucose, Bld: 110 mg/dL — ABNORMAL HIGH (ref 70–99)
Potassium: 4 mmol/L (ref 3.5–5.1)
Potassium: 4.6 mmol/L (ref 3.5–5.1)
Sodium: 127 mmol/L — ABNORMAL LOW (ref 135–145)
Sodium: 132 mmol/L — ABNORMAL LOW (ref 135–145)

## 2019-12-09 LAB — CBC
HCT: 40.8 % (ref 39.0–52.0)
Hemoglobin: 13.6 g/dL (ref 13.0–17.0)
MCH: 34.9 pg — ABNORMAL HIGH (ref 26.0–34.0)
MCHC: 33.3 g/dL (ref 30.0–36.0)
MCV: 104.6 fL — ABNORMAL HIGH (ref 80.0–100.0)
Platelets: 387 10*3/uL (ref 150–400)
RBC: 3.9 MIL/uL — ABNORMAL LOW (ref 4.22–5.81)
RDW: 12.5 % (ref 11.5–15.5)
WBC: 8.1 10*3/uL (ref 4.0–10.5)
nRBC: 0 % (ref 0.0–0.2)

## 2019-12-09 LAB — CULTURE, BLOOD (ROUTINE X 2): Culture: NO GROWTH

## 2019-12-09 MED ORDER — DOXYCYCLINE HYCLATE 100 MG PO TABS
100.0000 mg | ORAL_TABLET | Freq: Two times a day (BID) | ORAL | Status: DC
  Administered 2019-12-09 – 2019-12-12 (×6): 100 mg via ORAL
  Filled 2019-12-09 (×6): qty 1

## 2019-12-09 NOTE — Progress Notes (Signed)
PROGRESS NOTE    FERNANDEZ KENLEY  GYB:[id number] DOB: 19-Aug-1965 DOA: 12/04/2019 PCP: Patient, No Pcp Per    Brief Narrative:  Patient admitted to the hospital working diagnosis of generalized weakness in the setting of hyponatremia and alcohol abuse.  54 year old male who presented with generalized weakness.  He does have significant past medical history of alcohol abuse, COPD, and history of CVA.  Patient had several weeks of generalized weakness to the point where he could not get out of his bed.  On the day of admission he was found down on the floor, apparently laying there for 3 days unable to stand up.  On his initial physical examination blood pressure 141/99, heart rate 118, respiratory rate 18, temperature 98.3, oxygen saturation 98.3%.  He had moist mucous membranes, his lungs were clear to auscultation bilaterally, heart S1-S2, present, tachycardic, abdomen soft, positive lower extremity edema.  Patient had decreased strength 4 out of 5 all 4 extremities.  Flat affect.  Assessment & Plan:   Principal Problem:   Hyponatremia Active Problems:   COPD (chronic obstructive pulmonary disease) (HCC)   Alcoholism (HCC)   Generalized weakness   Transaminitis   Decubitus skin ulcer   Hypokalemia   Hypomagnesemia   1. Symptomatic hyponatremia/ hypokalemia/ hypomagensemia. Patient with persistent hyponatremia, hypokalemia and hypomagnesemia. Na 123, K 3,0, Mg 1.7. Renal function with serum cr at 0,69 and serum bicarbonate at 23 to 26.  Patient was initially given IV fluids currently appears volume overloaded.  Given IV Lasix. Monitor sodium. Liberate diet and follow with nutrition recommendations, patient will need Pt and Ot.   2. Etho abuse. Patient with chronic alcohol use and abuse. No clinical signs of acute withdrawal. Will continue with as needed lorazepam, will hold on CIWA protocol for now.   Continue with thiamine, and multivitamins. Check B12.   3. Pretibial pressure  ulcer stage 2. Present on admission, continue with local wound care.   4. Elevated liver enzymes. AST 165 and ALT 108. T Bil 1,3. Liver US with steatosis.  Negative hepatitis panel  5.  Cellulitis of the leg. Not improving with Augmentin. We will change the antibiotic to add MRSA and add doxycycline. Check CK. Lower extremity Doppler negative for DVT. Add gabapentin for pain  6.  Acute hypoxic respiratory failure Chest x-ray shows bilateral atelectasis. Patient is on 3 L of oxygen right now. Do not think the patient will be able to take incentive spirometry.  Monitor.   Status is: Inpatient  Dispo: The patient is from: Home              Anticipated d/c is to: Home.  Patient does not have any support at home.  His brother is currently admitted in the hospital.  Patient requires aggressive wound change as well as close monitoring and continues to have cellulitis.  Unsafe to discharge home as of right now.              Anticipated d/c date is: 1 to 2 days              Patient currently is not medically stable to d/c.   DVT prophylaxis: Enoxaparin   Code Status:   full  Family Communication:  No family at the bedside      Nutrition Status: Nutrition Problem: Increased nutrient needs Etiology: wound healing Signs/Symptoms: estimated needs Interventions: Ensure Enlive (each supplement provides 350kcal and 20 grams of protein), MVI, Magic cup     Skin Documentation: Pressure Injury  12/05/19 Sacrum Medial Stage 2 -  Partial thickness loss of dermis presenting as a shallow open injury with a red, pink wound bed without slough. (Active)  12/05/19 1400  Location: Sacrum  Location Orientation: Medial  Staging: Stage 2 -  Partial thickness loss of dermis presenting as a shallow open injury with a red, pink wound bed without slough.  Wound Description (Comments):   Present on Admission: Yes     Pressure Injury 12/05/19 Buttocks Right Stage 3 -  Full thickness tissue loss.  Subcutaneous fat may be visible but bone, tendon or muscle are NOT exposed. (Active)  12/05/19 1400  Location: Buttocks  Location Orientation: Right  Staging: Stage 3 -  Full thickness tissue loss. Subcutaneous fat may be visible but bone, tendon or muscle are NOT exposed.  Wound Description (Comments):   Present on Admission: Yes     Pressure Injury 12/05/19 Buttocks Left;Upper Deep Tissue Pressure Injury - Purple or maroon localized area of discolored intact skin or blood-filled blister due to damage of underlying soft tissue from pressure and/or shear. (Active)  12/05/19 1400  Location: Buttocks  Location Orientation: Left;Upper  Staging: Deep Tissue Pressure Injury - Purple or maroon localized area of discolored intact skin or blood-filled blister due to damage of underlying soft tissue from pressure and/or shear.  Wound Description (Comments):   Present on Admission: Yes        Subjective: No significant change in patient's condition.  No nausea no vomiting.  No fever no chills.  Continues to report pain in the leg.  Objective: Vitals:   12/08/19 2122 12/09/19 0457 12/09/19 0939 12/09/19 1746  BP: (!) 145/95 138/89 (!) 135/98 (!) 119/97  Pulse: 100  88 92  Resp: 18 18 18 20   Temp: 98 F (36.7 C) 98 F (36.7 C) 98.9 F (37.2 C) 99.8 F (37.7 C)  TempSrc: Oral Oral Oral Oral  SpO2: 98% 98% 97% 98%  Weight:      Height:        Intake/Output Summary (Last 24 hours) at 12/09/2019 1909 Last data filed at 12/09/2019 1456 Gross per 24 hour  Intake 1267 ml  Output 2650 ml  Net -1383 ml   Filed Weights   12/06/19 0444 12/07/19 0437 12/08/19 0451  Weight: 102.7 kg 104.7 kg 104.6 kg    Examination:   General: Not in pain or dyspnea, deconditioned and ill looking appearing  Neurology: awake and alert, non focal E ENT: mild pallor, no icterus, oral mucosa moist Cardiovascular: No JVD. S1-S2 present, rhythmic, no gallops, rubs, or murmurs.  Bilateral lower extremity edema  with tenderness Pulmonary: positive breath sounds bilaterally, adequate air movement, no wheezing, rhonchi or rales. Gastrointestinal. Abdomen with no organomegaly, non tender, no rebound or guarding Skin. No rashes Musculoskeletal: no joint deformities  Data Reviewed: I have personally reviewed following labs and imaging studies  CBC: Recent Labs  Lab 12/04/19 1950 12/05/19 0041 12/07/19 0637 12/08/19 0426 12/09/19 0432  WBC 11.5* 9.9 9.6 9.6 8.1  NEUTROABS 7.8*  --  6.0  --   --   HGB 15.1 13.6 13.0 13.1 13.6  HCT 44.0 39.7 39.2 39.3 40.8  MCV 102.8* 102.1* 104.5* 104.5* 104.6*  PLT 252 253 362 375 387   Basic Metabolic Panel: Recent Labs  Lab 12/05/19 0040 12/05/19 0040 12/06/19 0407 12/07/19 0637 12/08/19 0426 12/09/19 0432 12/09/19 1637  NA 123*   < > 131* 131* 128* 127* 132*  K 3.0*   < > 3.3* 4.0 4.0  4.0 4.6  CL 88*   < > 96* 97* 94* 93* 96*  CO2 23   < > 27 27 27 26  32  GLUCOSE 106*   < > 108* 107* 110* 105* 110*  BUN 11   < > 6 5* 8 12 18   CREATININE 0.69   < > 0.59* 0.61 0.73 0.61 0.95  CALCIUM 8.1*   < > 8.2* 8.4* 8.3* 8.4* 8.5*  MG 1.7  --  1.9 1.6*  --   --   --   PHOS 3.0  --  3.2  --   --   --   --    < > = values in this interval not displayed.   GFR: Estimated Creatinine Clearance: 104.9 mL/min (by C-G formula based on SCr of 0.95 mg/dL). Liver Function Tests: Recent Labs  Lab 12/04/19 1949 12/05/19 0040 12/07/19 0637  AST 186* 165* 80*  ALT 122* 108* 79*  ALKPHOS 67 53 53  BILITOT 1.8* 1.3* 0.7  PROT 7.4 6.5 6.9  ALBUMIN 2.4* 2.1* 2.0*   No results for input(s): LIPASE, AMYLASE in the last 168 hours. Recent Labs  Lab 12/07/19 0637  AMMONIA 36*   Coagulation Profile: Recent Labs  Lab 12/04/19 1949  INR 1.2   Cardiac Enzymes: Recent Labs  Lab 12/04/19 1949 12/07/19 2054  CKTOTAL 1,429* 167   BNP (last 3 results) No results for input(s): PROBNP in the last 8760 hours. HbA1C: No results for input(s): HGBA1C in the last 72  hours. CBG: No results for input(s): GLUCAP in the last 168 hours. Lipid Profile: No results for input(s): CHOL, HDL, LDLCALC, TRIG, CHOLHDL, LDLDIRECT in the last 72 hours. Thyroid Function Tests: Recent Labs    12/07/19 0637  TSH 2.840  FREET4 1.43*   Anemia Panel: No results for input(s): VITAMINB12, FOLATE, FERRITIN, TIBC, IRON, RETICCTPCT in the last 72 hours.    Radiology Studies: I have reviewed all of the imaging during this hospital visit personally     Scheduled Meds:  amoxicillin-clavulanate  1 tablet Oral Q12H   enoxaparin (LOVENOX) injection  40 mg Subcutaneous Q24H   feeding supplement (ENSURE ENLIVE)  237 mL Oral TID BM   folic acid  1 mg Oral Daily   gabapentin  100 mg Oral BID   lactulose  20 g Oral Daily   metoprolol tartrate  25 mg Oral BID   multivitamin with minerals  1 tablet Oral Daily   nicotine  14 mg Transdermal Daily   nutrition supplement (JUVEN)  1 packet Oral BID BM   thiamine  100 mg Oral Daily   Zinc Oxide   Topical BID   Continuous Infusions:    LOS: 4 days        12/09/19, MD

## 2019-12-09 NOTE — Progress Notes (Signed)
Physical Therapy Treatment Patient Details Name: Robert Frank MRN: [id number] DOB: 01/29/1966 Today's Date: 12/09/2019    History of Present Illness Pt is a 54 year old man admitted after being found down x 3 days. + hyponatremia, cellulitis B LEs. PMH: ETOH abuse, tobacco abuse, COPD, CVA with residual R side numbness, blindness in L eye.    PT Comments    Patient progressing with mobility this session able to get up and stand with step to recliner with min A of 1.  Still limited by pain and needing extra time.  Remains appropriate for SNF level rehab, but hopeful to progress with continues skilled PT in the acute setting.    Follow Up Recommendations  SNF;Supervision/Assistance - 24 hour     Equipment Recommendations  Rolling walker with 5" wheels    Recommendations for Other Services       Precautions / Restrictions Precautions Precautions: Fall    Mobility  Bed Mobility Overal bed mobility: Needs Assistance Bed Mobility: Rolling;Sidelying to Sit Rolling: Supervision Sidelying to sit: Min guard       General bed mobility comments: cues for technique  Transfers   Equipment used: Rolling walker (2 wheeled) Transfers: Sit to/from Stand Sit to Stand: From elevated surface;Min assist         General transfer comment: increased time, scooting to EOB with S, able to push up on bed to stand, assist to steady  Ambulation/Gait Ambulation/Gait assistance: Min assist Gait Distance (Feet): 3 Feet Assistive device: Rolling walker (2 wheeled) Gait Pattern/deviations: Step-to pattern;Decreased stride length;Shuffle     General Gait Details: assist for balance, cues for positioning with shuffling steps to recliner and backing up with RW; did not want to walk further due to LE pain   Stairs             Wheelchair Mobility    Modified Rankin (Stroke Patients Only)       Balance Overall balance assessment: Needs assistance Sitting-balance support: Feet  supported Sitting balance-Leahy Scale: Good     Standing balance support: Bilateral upper extremity supported Standing balance-Leahy Scale: Poor Standing balance comment: UE support for balance                            Cognition Arousal/Alertness: Awake/alert Behavior During Therapy: Flat affect Overall Cognitive Status: Within Functional Limits for tasks assessed                                 General Comments: not formally tested, but follows commands well and able to report on his status, did have some urine in bed as condom cath had come off without awareness      Exercises General Exercises - Lower Extremity Ankle Circles/Pumps: AROM;10 reps;Both;Supine Heel Slides: AROM;Both;5 reps;Supine    General Comments General comments (skin integrity, edema, etc.): set up lunch tray end of session, preferred feet down NT aware condom cath off      Pertinent Vitals/Pain Faces Pain Scale: Hurts little more Pain Location: B LEs with movement/touch, back Pain Descriptors / Indicators: Grimacing;Guarding;Moaning Pain Intervention(s): Monitored during session;Repositioned    Home Living                      Prior Function            PT Goals (current goals can now be found in the care  plan section) Progress towards PT goals: Progressing toward goals    Frequency    Min 2X/week      PT Plan Current plan remains appropriate    Co-evaluation              AM-PAC PT "6 Clicks" Mobility   Outcome Measure  Help needed turning from your back to your side while in a flat bed without using bedrails?: None Help needed moving from lying on your back to sitting on the side of a flat bed without using bedrails?: A Little Help needed moving to and from a bed to a chair (including a wheelchair)?: A Little Help needed standing up from a chair using your arms (e.g., wheelchair or bedside chair)?: A Little Help needed to walk in hospital  room?: A Little Help needed climbing 3-5 steps with a railing? : A Lot 6 Click Score: 18    End of Session Equipment Utilized During Treatment: Gait belt Activity Tolerance: Patient limited by pain Patient left: in chair;with call bell/phone within reach;with chair alarm set Nurse Communication: Mobility status PT Visit Diagnosis: Pain;Other abnormalities of gait and mobility (R26.89);Muscle weakness (generalized) (M62.81)     Time: 9528-4132 PT Time Calculation (min) (ACUTE ONLY): 24 min  Charges:  $Therapeutic Exercise: 8-22 mins $Therapeutic Activity: 8-22 mins                     Sheran Lawless, PT Acute Rehabilitation Services Pager:[phone] Office:[phone] 12/09/2019    Robert Frank 12/09/2019, 2:30 PM

## 2019-12-09 NOTE — Plan of Care (Signed)
  Problem: Safety: Goal: Ability to remain free from injury will improve Outcome: Progressing   

## 2019-12-10 LAB — BASIC METABOLIC PANEL
Anion gap: 7 (ref 5–15)
BUN: 14 mg/dL (ref 6–20)
CO2: 26 mmol/L (ref 22–32)
Calcium: 8.5 mg/dL — ABNORMAL LOW (ref 8.9–10.3)
Chloride: 96 mmol/L — ABNORMAL LOW (ref 98–111)
Creatinine, Ser: 0.68 mg/dL (ref 0.61–1.24)
GFR calc Af Amer: >60 mL/min (ref >60)
GFR calc non Af Amer: >60 mL/min (ref >60)
Glucose, Bld: 102 mg/dL — ABNORMAL HIGH (ref 70–99)
Potassium: 4.1 mmol/L (ref 3.5–5.1)
Sodium: 129 mmol/L — ABNORMAL LOW (ref 135–145)

## 2019-12-10 LAB — CBC
HCT: 40.5 % (ref 39.0–52.0)
Hemoglobin: 13.5 g/dL (ref 13.0–17.0)
MCH: 35.2 pg — ABNORMAL HIGH (ref 26.0–34.0)
MCHC: 33.3 g/dL (ref 30.0–36.0)
MCV: 105.5 fL — ABNORMAL HIGH (ref 80.0–100.0)
Platelets: 405 10*3/uL — ABNORMAL HIGH (ref 150–400)
RBC: 3.84 MIL/uL — ABNORMAL LOW (ref 4.22–5.81)
RDW: 12.7 % (ref 11.5–15.5)
WBC: 7.4 10*3/uL (ref 4.0–10.5)
nRBC: 0 % (ref 0.0–0.2)

## 2019-12-10 MED ORDER — GABAPENTIN 100 MG PO CAPS
200.0000 mg | ORAL_CAPSULE | Freq: Two times a day (BID) | ORAL | Status: DC
  Administered 2019-12-10 – 2019-12-12 (×4): 200 mg via ORAL
  Filled 2019-12-10 (×4): qty 2

## 2019-12-10 NOTE — Progress Notes (Signed)
Robert Frank  YNW:[id number] DOB: May 06, 1966 DOA: 12/04/2019 PCP: Patient, No Pcp Per    Brief Narrative:  Patient admitted to the hospital working diagnosis of generalized weakness in the setting of hyponatremia and alcohol abuse.  54 year old male who presented with generalized weakness.  He does have significant past medical history of alcohol abuse, COPD, and history of CVA.  Patient had several weeks of generalized weakness to the point where he could not get out of his bed.  On the day of admission he was found down on the floor, apparently laying there for 3 days unable to stand up.  On his initial physical examination blood pressure 141/99, heart rate 118, respiratory rate 18, temperature 98.3, oxygen saturation 98.3%.  He had moist mucous membranes, his lungs were clear to auscultation bilaterally, heart S1-S2, present, tachycardic, abdomen soft, positive lower extremity edema.  Patient had decreased strength 4 out of 5 all 4 extremities.  Flat affect.  Assessment & Plan: 1. Symptomatic hyponatremia/ hypokalemia/ hypomagensemia. Patient with persistent hyponatremia,  Also had hypokalemia and hypomagnesemia.  Na 123, K 3,0, Mg 1.7. on admission  Patient was initially given IV fluids, as become volume overloaded needed lasix. Liberate diet and follow with nutrition recommendations, patient will need Pt and Ot.  Monitor sodium.  2. Etoh abuse.  Patient with chronic alcohol use and abuse.  No clinical signs of acute withdrawal.  Will continue with as needed lorazepam. Continue with thiamine, and multivitamins.  3. Pretibial pressure ulcer stage 2.  Present on admission,  continue with local wound care.   4. Elevated liver enzymes.  AST 165 and ALT 108. T Bil 1,3.  Liver US with steatosis.   Negative hepatitis panel  5.  Cellulitis of the leg. Not improving with Augmentin. Add doxycycline. Lower extremity Doppler negative for DVT. Add gabapentin for  pain  6.  Acute hypoxic respiratory failure resolved  Likely due to acute HFpEF Chest x-ray shows bilateral atelectasis. Patient was on 3 L of oxygen , improved to room air after IV lasix. Do not think the patient will be able to take incentive spirometry.  Monitor.  Status is: Inpatient  Dispo: The patient is from: Home              Anticipated d/c is to: Home.  Patient does not have any support at home.  His brother is currently admitted in the hospital.  Patient requires aggressive wound change as well as close monitoring and continues to have cellulitis.  Unsafe to discharge home as of right now.              Anticipated d/c date is: 1 to 2 days              Patient currently is not medically stable to d/c.   DVT prophylaxis: Enoxaparin   Code Status:   full  Family Communication:  No family at the bedside      Nutrition Status: Nutrition Problem: Increased nutrient needs Etiology: wound healing Signs/Symptoms: estimated needs Interventions: Ensure Enlive (each supplement provides 350kcal and 20 grams of protein), MVI, Magic cup     Skin Documentation: Pressure Injury 12/05/19 Sacrum Medial Stage 2 -  Partial thickness loss of dermis presenting as a shallow open injury with a red, pink wound bed without slough. (Active)  12/05/19 1400  Location: Sacrum  Location Orientation: Medial  Staging: Stage 2 -  Partial thickness loss of dermis presenting as a shallow open injury  with a red, pink wound bed without slough.  Wound Description (Comments):   Present on Admission: Yes     Pressure Injury 12/05/19 Buttocks Right Stage 3 -  Full thickness tissue loss. Subcutaneous fat may be visible but bone, tendon or muscle are NOT exposed. (Active)  12/05/19 1400  Location: Buttocks  Location Orientation: Right  Staging: Stage 3 -  Full thickness tissue loss. Subcutaneous fat may be visible but bone, tendon or muscle are NOT exposed.  Wound Description (Comments):   Present on  Admission: Yes     Pressure Injury 12/05/19 Buttocks Left;Upper Deep Tissue Pressure Injury - Purple or maroon localized area of discolored intact skin or blood-filled blister due to damage of underlying soft tissue from pressure and/or shear. (Active)  12/05/19 1400  Location: Buttocks  Location Orientation: Left;Upper  Staging: Deep Tissue Pressure Injury - Purple or maroon localized area of discolored intact skin or blood-filled blister due to damage of underlying soft tissue from pressure and/or shear.  Wound Description (Comments):   Present on Admission: Yes   Subjective: No nausea or vomiting. Pain better and rash better.   Objective: Vitals:   12/10/19 0431 12/10/19 0548 12/10/19 0924 12/10/19 1703  BP: 122/90 (!) 138/91 115/76 124/82  Pulse: 92 87 94 82  Resp: 16 16 18 18   Temp: 98.8 F (37.1 C) 98.4 F (36.9 C) 98.5 F (36.9 C) 98.8 F (37.1 C)  TempSrc: Oral Oral Oral Oral  SpO2: 97% 97% 100% 98%  Weight:      Height:        Intake/Output Summary (Last 24 hours) at 12/10/2019 1822 Last data filed at 12/10/2019 1334 Gross per 24 hour  Intake 1062 ml  Output 1500 ml  Net -438 ml   Filed Weights   12/07/19 0437 12/08/19 0451 12/09/19 2110  Weight: 104.7 kg 104.6 kg 102.3 kg    Examination:   General: Not in pain or dyspnea, deconditioned and ill looking appearing  Neurology: awake and alert, non focal E ENT: mild pallor, no icterus, oral mucosa moist Cardiovascular: No JVD. S1-S2 present, rhythmic, no gallops, rubs, or murmurs.  Bilateral lower extremity edema with tenderness Pulmonary: positive breath sounds bilaterally, adequate air movement, no wheezing, rhonchi or rales. Gastrointestinal. Abdomen with no organomegaly, non tender, no rebound or guarding Skin. No rashes Musculoskeletal: no joint deformities  Data Reviewed: I have personally reviewed following labs and imaging studies  CBC: Recent Labs  Lab 12/04/19 1950 12/04/19 1950 12/05/19 0041  12/07/19 12/09/19 12/08/19 0426 12/09/19 0432 12/10/19 0421  WBC 11.5*   < > 9.9 9.6 9.6 8.1 7.4  NEUTROABS 7.8*  --   --  6.0  --   --   --   HGB 15.1   < > 13.6 13.0 13.1 13.6 13.5  HCT 44.0   < > 39.7 39.2 39.3 40.8 40.5  MCV 102.8*   < > 102.1* 104.5* 104.5* 104.6* 105.5*  PLT 252   < > 253 362 375 387 405*   < > = values in this interval not displayed.   Basic Metabolic Panel: Recent Labs  Lab 12/05/19 0040 12/05/19 0040 12/06/19 0407 12/06/19 0407 12/07/19 12/09/19 12/08/19 0426 12/09/19 0432 12/09/19 1637 12/10/19 0421  NA 123*   < > 131*   < > 131* 128* 127* 132* 129*  K 3.0*   < > 3.3*   < > 4.0 4.0 4.0 4.6 4.1  CL 88*   < > 96*   < > 97*  94* 93* 96* 96*  CO2 23   < > 27   < > 27 27 26  32 26  GLUCOSE 106*   < > 108*   < > 107* 110* 105* 110* 102*  BUN 11   < > 6   < > 5* 8 12 18 14   CREATININE 0.69   < > 0.59*   < > 0.61 0.73 0.61 0.95 0.68  CALCIUM 8.1*   < > 8.2*   < > 8.4* 8.3* 8.4* 8.5* 8.5*  MG 1.7  --  1.9  --  1.6*  --   --   --   --   PHOS 3.0  --  3.2  --   --   --   --   --   --    < > = values in this interval not displayed.   GFR: Estimated Creatinine Clearance: 123.1 mL/min (by C-G formula based on SCr of 0.68 mg/dL). Liver Function Tests: Recent Labs  Lab 12/04/19 1949 12/05/19 0040 12/07/19 0637  AST 186* 165* 80*  ALT 122* 108* 79*  ALKPHOS 67 53 53  BILITOT 1.8* 1.3* 0.7  PROT 7.4 6.5 6.9  ALBUMIN 2.4* 2.1* 2.0*   No results for input(s): LIPASE, AMYLASE in the last 168 hours. Recent Labs  Lab 12/07/19 0637  AMMONIA 36*   Coagulation Profile: Recent Labs  Lab 12/04/19 1949  INR 1.2   Cardiac Enzymes: Recent Labs  Lab 12/04/19 1949 12/07/19 2054  CKTOTAL 1,429* 167   BNP (last 3 results) No results for input(s): PROBNP in the last 8760 hours. HbA1C: No results for input(s): HGBA1C in the last 72 hours. CBG: No results for input(s): GLUCAP in the last 168 hours. Lipid Profile: No results for input(s): CHOL, HDL, LDLCALC,  TRIG, CHOLHDL, LDLDIRECT in the last 72 hours. Thyroid Function Tests: No results for input(s): TSH, T4TOTAL, FREET4, T3FREE, THYROIDAB in the last 72 hours. Anemia Panel: No results for input(s): VITAMINB12, FOLATE, FERRITIN, TIBC, IRON, RETICCTPCT in the last 72 hours.    Radiology Studies: I have reviewed all of the imaging during this hospital visit personally     Scheduled Meds:  amoxicillin-clavulanate  1 tablet Oral Q12H   doxycycline  100 mg Oral Q12H   enoxaparin (LOVENOX) injection  40 mg Subcutaneous Q24H   feeding supplement (ENSURE ENLIVE)  237 mL Oral TID BM   folic acid  1 mg Oral Daily   gabapentin  100 mg Oral BID   lactulose  20 g Oral Daily   metoprolol tartrate  25 mg Oral BID   multivitamin with minerals  1 tablet Oral Daily   nicotine  14 mg Transdermal Daily   nutrition supplement (JUVEN)  1 packet Oral BID BM   thiamine  100 mg Oral Daily   Continuous Infusions:   LOS: 5 days   12/06/19, MD

## 2019-12-11 ENCOUNTER — Inpatient Hospital Stay (HOSPITAL_COMMUNITY): Payer: Self-pay

## 2019-12-11 LAB — BASIC METABOLIC PANEL
Anion gap: 8 (ref 5–15)
BUN: 14 mg/dL (ref 6–20)
CO2: 25 mmol/L (ref 22–32)
Calcium: 8.7 mg/dL — ABNORMAL LOW (ref 8.9–10.3)
Chloride: 99 mmol/L (ref 98–111)
Creatinine, Ser: 0.68 mg/dL (ref 0.61–1.24)
GFR calc Af Amer: >60 mL/min (ref >60)
GFR calc non Af Amer: >60 mL/min (ref >60)
Glucose, Bld: 106 mg/dL — ABNORMAL HIGH (ref 70–99)
Potassium: 4 mmol/L (ref 3.5–5.1)
Sodium: 132 mmol/L — ABNORMAL LOW (ref 135–145)

## 2019-12-11 LAB — CBC
HCT: 39.2 % (ref 39.0–52.0)
Hemoglobin: 13 g/dL (ref 13.0–17.0)
MCH: 35.4 pg — ABNORMAL HIGH (ref 26.0–34.0)
MCHC: 33.2 g/dL (ref 30.0–36.0)
MCV: 106.8 fL — ABNORMAL HIGH (ref 80.0–100.0)
Platelets: 430 10*3/uL — ABNORMAL HIGH (ref 150–400)
RBC: 3.67 MIL/uL — ABNORMAL LOW (ref 4.22–5.81)
RDW: 12.6 % (ref 11.5–15.5)
WBC: 7.4 10*3/uL (ref 4.0–10.5)
nRBC: 0 % (ref 0.0–0.2)

## 2019-12-11 NOTE — Progress Notes (Signed)
PROGRESS NOTE    Robert Frank  ZOX:[id number] DOB: [birth year] DOA: 12/04/2019 PCP: Patient, No Pcp Per    Brief Narrative:  Patient admitted to the hospital working diagnosis of generalized weakness in the setting of hyponatremia and alcohol abuse.  54 year old male who presented with generalized weakness.  He does have significant past medical history of alcohol abuse, COPD, and history of CVA.  Patient had several weeks of generalized weakness to the point where he could not get out of his bed.  On the day of admission he was found down on the floor, apparently laying there for 3 days unable to stand up.  On his initial physical examination blood pressure 141/99, heart rate 118, respiratory rate 18, temperature 98.3, oxygen saturation 98.3%.  He had moist mucous membranes, his lungs were clear to auscultation bilaterally, heart S1-S2, present, tachycardic, abdomen soft, positive lower extremity edema.  Patient had decreased strength 4 out of 5 all 4 extremities.  Flat affect.  Assessment & Plan: 1. Symptomatic hyponatremia/ hypokalemia/ hypomagensemia. Patient with persistent hyponatremia,  Also had hypokalemia and hypomagnesemia.  Na 123, K 3,0, Mg 1.7. on admission  Patient was initially given IV fluids, as become volume overloaded needed lasix. Liberate diet and follow with nutrition recommendations, patient will need Pt and Ot.  Unable to provide one right now. Monitor sodium.  2. Etoh abuse.  Patient with chronic alcohol use and abuse.  No clinical signs of acute withdrawal.  Will continue with as needed lorazepam. Continue with thiamine, and multivitamins.  3. Pretibial pressure ulcer stage 2.  Present on admission,  continue with local wound care.   4. Elevated liver enzymes.  AST 165 and ALT 108. T Bil 1,3.  Liver US with steatosis.   Negative hepatitis panel  5.  Cellulitis of the leg. Not improving with Augmentin. Add doxycycline. Lower extremity Doppler  negative for DVT. Add gabapentin for pain. Check x-ray foot as well as x-ray tibia-fibula. Pain is still out of proportion to cellulitis.  6.  Acute hypoxic respiratory failure resolved  Likely due to acute HFpEF Chest x-ray shows bilateral atelectasis. Patient was on 3 L of oxygen , improved to room air after IV lasix. Do not think the patient will be able to take incentive spirometry.  Monitor.  Status is: Inpatient  Dispo: The patient is from: Home              Anticipated d/c is to: Home.  Patient does not have any support at home.  His brother is currently admitted in the hospital.  Patient requires aggressive wound change as well as close monitoring and continues to have cellulitis.  Unsafe to discharge home as of right now.              Anticipated d/c date is: 1 to 2 days              Patient currently is not medically stable to d/c.   DVT prophylaxis: Enoxaparin   Code Status:   full  Family Communication:  No family at the bedside      Nutrition Status: Nutrition Problem: Increased nutrient needs Etiology: wound healing Signs/Symptoms: estimated needs Interventions: Ensure Enlive (each supplement provides 350kcal and 20 grams of protein), MVI, Magic cup     Skin Documentation: Pressure Injury 12/05/19 Sacrum Medial Stage 2 -  Partial thickness loss of dermis presenting as a shallow open injury with a red, pink wound bed without slough. (Active)  12/05/19 1400  Location:  Sacrum  Location Orientation: Medial  Staging: Stage 2 -  Partial thickness loss of dermis presenting as a shallow open injury with a red, pink wound bed without slough.  Wound Description (Comments):   Present on Admission: Yes     Pressure Injury 12/05/19 Buttocks Right Stage 3 -  Full thickness tissue loss. Subcutaneous fat may be visible but bone, tendon or muscle are NOT exposed. (Active)  12/05/19 1400  Location: Buttocks  Location Orientation: Right  Staging: Stage 3 -  Full thickness  tissue loss. Subcutaneous fat may be visible but bone, tendon or muscle are NOT exposed.  Wound Description (Comments):   Present on Admission: Yes     Pressure Injury 12/05/19 Buttocks Left;Upper Deep Tissue Pressure Injury - Purple or maroon localized area of discolored intact skin or blood-filled blister due to damage of underlying soft tissue from pressure and/or shear. (Active)  12/05/19 1400  Location: Buttocks  Location Orientation: Left;Upper  Staging: Deep Tissue Pressure Injury - Purple or maroon localized area of discolored intact skin or blood-filled blister due to damage of underlying soft tissue from pressure and/or shear.  Wound Description (Comments):   Present on Admission: Yes   Subjective: Reports severe pain.  No nausea no vomiting.  No fever no chills.  Objective: Vitals:   12/10/19 2146 12/10/19 2147 12/11/19 0448 12/11/19 0900  BP:  (!) 152/103 128/81 138/86  Pulse:  91 88 85  Resp:  18 16 17   Temp:  99.1 F (37.3 C) 98.9 F (37.2 C) 98.2 F (36.8 C)  TempSrc:  Oral Oral Oral  SpO2:  97% 96% 98%  Weight: 101.3 kg     Height:        Intake/Output Summary (Last 24 hours) at 12/11/2019 1722 Last data filed at 12/11/2019 1300 Gross per 24 hour  Intake 800 ml  Output 1350 ml  Net -550 ml   Filed Weights   12/08/19 0451 12/09/19 2110 12/10/19 2146  Weight: 104.6 kg 102.3 kg 101.3 kg    Examination:   General: Not in pain or dyspnea, deconditioned and ill looking appearing  Neurology: awake and alert, non focal E ENT: mild pallor, no icterus, oral mucosa moist Cardiovascular: No JVD. S1-S2 present, rhythmic, no gallops, rubs, or murmurs.  Bilateral lower extremity edema with tenderness Pulmonary: positive breath sounds bilaterally, adequate air movement, no wheezing, rhonchi or rales. Gastrointestinal. Abdomen with no organomegaly, non tender, no rebound or guarding Skin. No rashes, ulcer has healthy granulation tissue, still significant swelling and  redness involving calf on left. Musculoskeletal: no joint deformities  Data Reviewed: I have personally reviewed following labs and imaging studies  CBC: Recent Labs  Lab 12/04/19 1950 12/05/19 0041 12/07/19 12/09/19 12/08/19 0426 12/09/19 0432 12/10/19 0421 12/11/19 0318  WBC 11.5*   < > 9.6 9.6 8.1 7.4 7.4  NEUTROABS 7.8*  --  6.0  --   --   --   --   HGB 15.1   < > 13.0 13.1 13.6 13.5 13.0  HCT 44.0   < > 39.2 39.3 40.8 40.5 39.2  MCV 102.8*   < > 104.5* 104.5* 104.6* 105.5* 106.8*  PLT 252   < > 362 375 387 405* 430*   < > = values in this interval not displayed.   Basic Metabolic Panel: Recent Labs  Lab 12/05/19 0040 12/05/19 0040 12/06/19 0407 12/06/19 0407 12/07/19 12/09/19 12/07/19 12/09/19 12/08/19 0426 12/09/19 0432 12/09/19 1637 12/10/19 0421 12/11/19 0318  NA 123*   < >  131*   < > 131*   < > 128* 127* 132* 129* 132*  K 3.0*   < > 3.3*   < > 4.0   < > 4.0 4.0 4.6 4.1 4.0  CL 88*   < > 96*   < > 97*   < > 94* 93* 96* 96* 99  CO2 23   < > 27   < > 27   < > 27 26 32 26 25  GLUCOSE 106*   < > 108*   < > 107*   < > 110* 105* 110* 102* 106*  BUN 11   < > 6   < > 5*   < > 8 12 18 14 14   CREATININE 0.69   < > 0.59*   < > 0.61   < > 0.73 0.61 0.95 0.68 0.68  CALCIUM 8.1*   < > 8.2*   < > 8.4*   < > 8.3* 8.4* 8.5* 8.5* 8.7*  MG 1.7  --  1.9  --  1.6*  --   --   --   --   --   --   PHOS 3.0  --  3.2  --   --   --   --   --   --   --   --    < > = values in this interval not displayed.   GFR: Estimated Creatinine Clearance: 122.5 mL/min (by C-G formula based on SCr of 0.68 mg/dL). Liver Function Tests: Recent Labs  Lab 12/04/19 1949 12/05/19 0040 12/07/19 0637  AST 186* 165* 80*  ALT 122* 108* 79*  ALKPHOS 67 53 53  BILITOT 1.8* 1.3* 0.7  PROT 7.4 6.5 6.9  ALBUMIN 2.4* 2.1* 2.0*   No results for input(s): LIPASE, AMYLASE in the last 168 hours. Recent Labs  Lab 12/07/19 0637  AMMONIA 36*   Coagulation Profile: Recent Labs  Lab 12/04/19 1949  INR 1.2    Cardiac Enzymes: Recent Labs  Lab 12/04/19 1949 12/07/19 2054  CKTOTAL 1,429* 167   BNP (last 3 results) No results for input(s): PROBNP in the last 8760 hours. HbA1C: No results for input(s): HGBA1C in the last 72 hours. CBG: No results for input(s): GLUCAP in the last 168 hours. Lipid Profile: No results for input(s): CHOL, HDL, LDLCALC, TRIG, CHOLHDL, LDLDIRECT in the last 72 hours. Thyroid Function Tests: No results for input(s): TSH, T4TOTAL, FREET4, T3FREE, THYROIDAB in the last 72 hours. Anemia Panel: No results for input(s): VITAMINB12, FOLATE, FERRITIN, TIBC, IRON, RETICCTPCT in the last 72 hours.    Radiology Studies: I have reviewed all of the imaging during this hospital visit personally     Scheduled Meds: . doxycycline  100 mg Oral Q12H  . enoxaparin (LOVENOX) injection  40 mg Subcutaneous Q24H  . feeding supplement (ENSURE ENLIVE)  237 mL Oral TID BM  . folic acid  1 mg Oral Daily  . gabapentin  200 mg Oral BID  . lactulose  20 g Oral Daily  . metoprolol tartrate  25 mg Oral BID  . multivitamin with minerals  1 tablet Oral Daily  . nicotine  14 mg Transdermal Daily  . nutrition supplement (JUVEN)  1 packet Oral BID BM  . thiamine  100 mg Oral Daily   Continuous Infusions:   LOS: 6 days   12/09/19, MD

## 2019-12-12 LAB — BASIC METABOLIC PANEL
Anion gap: 6 (ref 5–15)
BUN: 14 mg/dL (ref 6–20)
CO2: 25 mmol/L (ref 22–32)
Calcium: 8.5 mg/dL — ABNORMAL LOW (ref 8.9–10.3)
Chloride: 100 mmol/L (ref 98–111)
Creatinine, Ser: 0.6 mg/dL — ABNORMAL LOW (ref 0.61–1.24)
GFR calc Af Amer: >60 mL/min (ref >60)
GFR calc non Af Amer: >60 mL/min (ref >60)
Glucose, Bld: 107 mg/dL — ABNORMAL HIGH (ref 70–99)
Potassium: 3.9 mmol/L (ref 3.5–5.1)
Sodium: 131 mmol/L — ABNORMAL LOW (ref 135–145)

## 2019-12-12 LAB — CBC
HCT: 38.1 % — ABNORMAL LOW (ref 39.0–52.0)
Hemoglobin: 12.5 g/dL — ABNORMAL LOW (ref 13.0–17.0)
MCH: 35 pg — ABNORMAL HIGH (ref 26.0–34.0)
MCHC: 32.8 g/dL (ref 30.0–36.0)
MCV: 106.7 fL — ABNORMAL HIGH (ref 80.0–100.0)
Platelets: 423 10*3/uL — ABNORMAL HIGH (ref 150–400)
RBC: 3.57 MIL/uL — ABNORMAL LOW (ref 4.22–5.81)
RDW: 12.6 % (ref 11.5–15.5)
WBC: 7.1 10*3/uL (ref 4.0–10.5)
nRBC: 0 % (ref 0.0–0.2)

## 2019-12-12 MED ORDER — FOLIC ACID 1 MG PO TABS: 1.0000 mg | ORAL_TABLET | Freq: Every day | ORAL | 0 refills | Status: DC

## 2019-12-12 MED ORDER — GABAPENTIN 100 MG PO CAPS: 100.0000 mg | ORAL_CAPSULE | Freq: Two times a day (BID) | ORAL | 0 refills | Status: DC

## 2019-12-12 MED ORDER — NICOTINE 14 MG/24HR TD PT24: 14.0000 mg | MEDICATED_PATCH | Freq: Every day | TRANSDERMAL | 0 refills | Status: DC

## 2019-12-12 MED ORDER — DOXYCYCLINE HYCLATE 100 MG PO TABS: 100.0000 mg | ORAL_TABLET | Freq: Two times a day (BID) | ORAL | 0 refills | Status: AC

## 2019-12-12 MED ORDER — ENSURE ENLIVE PO LIQD: 237.0000 mL | Freq: Three times a day (TID) | ORAL | 12 refills | Status: DC

## 2019-12-12 MED ORDER — JUVEN PO PACK: 1.0000 | PACK | Freq: Two times a day (BID) | ORAL | 0 refills | Status: DC

## 2019-12-12 MED ORDER — ADULT MULTIVITAMIN W/MINERALS CH: 1.0000 | ORAL_TABLET | Freq: Every day | ORAL | 0 refills | Status: DC

## 2019-12-12 MED ORDER — THIAMINE HCL 100 MG PO TABS: 100.0000 mg | ORAL_TABLET | Freq: Every day | ORAL | 0 refills | Status: DC

## 2019-12-12 MED ORDER — LACTULOSE 10 GM/15ML PO SOLN: 20.0000 g | Freq: Every day | ORAL | 0 refills | Status: DC

## 2019-12-12 MED FILL — LACTULOSE 10 GM/15 ML SOLN: 10 | 8 days supply | Qty: 236 | Fill #0

## 2019-12-12 MED FILL — DOXYCYCLINE HYCLATE 100 MG: 100 | 2 days supply | Qty: 4 | Fill #0

## 2019-12-12 MED FILL — FOLIC ACID 1 MG TABS: 1 | 60 days supply | Qty: 60 | Fill #0

## 2019-12-12 MED FILL — GABAPENTIN 100 MG CAPSULE: 100 | 30 days supply | Qty: 60 | Fill #0

## 2019-12-12 MED FILL — VITAMIN B-1 100 MG TABS: 100 | 60 days supply | Qty: 60 | Fill #0

## 2019-12-12 NOTE — Progress Notes (Signed)
Patient educated on D/C papers, questions answered, IVs removed, taken by wheelchair to valet parking for pick up.

## 2019-12-12 NOTE — TOC Transition Note (Signed)
Transition of Care [facility]) - CM/SW Discharge Note   Patient Details  Name: Robert Frank MRN: [id number] Date of Birth: 1965/08/05  [facility]) CM/SW Contact:  Bess Kinds, RN Phone Number: [phone] 12/12/2019, 12:41 PM   Clinical Narrative:     Spoke with patient at the bedside. PTA lives in house with his brother. His brother is currently hospitalized at [facility], and patient states he has some mental problems. Patient reports that he has no income, but does have a food stamp card. He reports that a couple of people live in his attic, and pay him to stay there. Patient states that he doesn't have transportation home. He has a walker that he says is in his front yard that he has had for awhile. Patient states that his best friend was just here, but that he lives in [place] and doesn't know how to get a hold of him. Patient states that he received a letter that he was approved for Medicare and Medicaid. Stated that he had gone to a lawyer about 3 years ago who was assisting with this. NCM called financial counselor to advise for follow up. Patient did give permission to call his pastor at [facility] to discuss transition plans.   Spoke with Ritta Slot, pastor, at [phone]. Discussed patient situation. Robert Frank stated that he has been trying to help patient and his brother, [name], who has been deemed to need a legal guardian. He stated that patient's home has not had electricity since 2014 and that the home could be considered a hoard. Robert Frank stated that patient drinks about 2.5 42 oz beers a day, and the the tenants in the home usually pay patient in cash or beer. Discussed patient lack of PCP and needing f/u, Robert Frank stated that he has connected patient to the [facility] clinic on [address] and that Dr. Waynetta Sandy actually paid patient a house call. Robert Frank stated that he will assist patient with follow up.   Unable to get charity home health d/t patient living  conditions. Patient states that when he gets home he will remove the dressings and put on his shorts, and that he doesn't like having anything on his legs.   Patient uses [facility] in April of this year. Robert Frank offered to pay if not too expensive. Cash price for medications from [facility] pharmacy (minus feeding supplements and nicotine patches) is $18. Robert Frank agreeable, and TOC to contact for payment. Medications to be delivered to either NCM or bedside nurse to deliver to [facility] who also assists with medication management.  Robert Frank to provide transportation home for patient.   Unable to secure home health d/t patient living conditions.   No further TOC needs identified at this time.  Final next level of care: Home/Self Care Barriers to Discharge: No Barriers Identified   Patient Goals and CMS Choice Patient states their goals for this hospitalization and ongoing recovery are:: return home CMS Medicare.gov Compare Post Acute Care list provided to:: Patient Choice offered to / list presented to : NA  Discharge Placement                       Discharge Plan and Services   Discharge Planning Services: CM Consult Post Acute Care Choice: NA          DME Arranged: N/A DME Agency: NA       HH Arranged: NA HH Agency: NA        Social  Determinants of Health (SDOH) Interventions     Readmission Risk Interventions No flowsheet data found.

## 2019-12-12 NOTE — Discharge Instructions (Signed)
Hyponatremia Hyponatremia is when the amount of salt (sodium) in your blood is too low. When salt levels are low, your body may take in extra water. This can cause swelling throughout the body. The swelling often affects the brain. What are the causes? This condition may be caused by:  Certain medical problems or conditions.  Vomiting a lot.  Having watery poop (diarrhea) often.  Certain medicines or illegal drugs.  Not having enough water in the body (dehydration).  Drinking too much water.  Eating a diet that is low in salt.  Large burns on your body.  Too much sweating. What increases the risk? You are more likely to get this condition if you:  Have long-term (chronic) kidney disease.  Have heart failure.  Have a medical condition that causes you to have watery poop often.  Do very hard exercises.  Take medicines that affect the amount of salt is in your blood. What are the signs or symptoms? Symptoms of this condition include:  Headache.  Feeling like you may vomit (nausea).  Vomiting.  Being very tired (lethargic).  Muscle weakness and cramps.  Not wanting to eat as much as normal (loss of appetite).  Feeling weak or light-headed. Severe symptoms of this condition include:  Confusion.  Feeling restless (agitation).  Having a fast heart rate.  Passing out (fainting).  Seizures.  Coma. How is this treated? Treatment for this condition depends on the cause. Treatment may include:  Getting fluids through an IV tube that is put into one of your veins.  Taking medicines to fix the salt levels in your blood. If medicines are causing the problem, your medicines will need to be changed.  Limiting how much water or fluid you take in.  Monitoring in the hospital to watch your symptoms. Follow these instructions at home:   Take over-the-counter and prescription medicines only as told by your doctor. Many medicines can make this condition worse.  Talk with your doctor about any medicines that you are taking.  Eat and drink exactly as you are told by your doctor. ? Eat only the foods you are told to eat. ? Limit how much fluid you take.  Do not drink alcohol.  Keep all follow-up visits as told by your doctor. This is important. Contact a doctor if:  You feel more like you may vomit.  You feel more tired.  Your headache gets worse.  You feel more confused.  You feel weaker.  Your symptoms go away and then they come back.  You have trouble following the diet instructions. Get help right away if:  You have a seizure.  You pass out.  You keep having watery poop.  You keep vomiting. Summary  Hyponatremia is when the amount of salt in your blood is too low.  When salt levels are low, you can have swelling throughout the body. The swelling mostly affects the brain.  Treatment depends on the cause. Treatment may include getting IV fluids, medicines, or not drinking as much fluid. This information is not intended to replace advice given to you by your health care provider. Make sure you discuss any questions you have with your health care provider. Document Revised: 08/12/2018 Document Reviewed: 04/29/2018 Elsevier Patient Education  2020 [employer]  

## 2019-12-15 ENCOUNTER — Inpatient Hospital Stay (HOSPITAL_COMMUNITY)
Admission: EM | Admit: 2019-12-15 | Discharge: 2020-01-04 | DRG: 573 | Disposition: A | Discharge status: Home / Self Care | Payer: Self-pay | Attending: Family Medicine | Admitting: Family Medicine

## 2019-12-15 ENCOUNTER — Emergency Department (HOSPITAL_COMMUNITY): Payer: Self-pay

## 2019-12-15 ENCOUNTER — Encounter (HOSPITAL_COMMUNITY): Payer: Self-pay | Admitting: Emergency Medicine

## 2019-12-15 ENCOUNTER — Other Ambulatory Visit: Payer: Self-pay

## 2019-12-15 DIAGNOSIS — R531 Weakness: Secondary | ICD-10-CM

## 2019-12-15 DIAGNOSIS — T368X5A Adverse effect of other systemic antibiotics, initial encounter: Secondary | ICD-10-CM | POA: Diagnosis not present

## 2019-12-15 DIAGNOSIS — L02416 Cutaneous abscess of left lower limb: Secondary | ICD-10-CM

## 2019-12-15 DIAGNOSIS — E871 Hypo-osmolality and hyponatremia: Secondary | ICD-10-CM | POA: Diagnosis present

## 2019-12-15 DIAGNOSIS — M549 Dorsalgia, unspecified: Secondary | ICD-10-CM | POA: Diagnosis present

## 2019-12-15 DIAGNOSIS — M25561 Pain in right knee: Secondary | ICD-10-CM | POA: Diagnosis present

## 2019-12-15 DIAGNOSIS — R7989 Other specified abnormal findings of blood chemistry: Secondary | ICD-10-CM | POA: Diagnosis not present

## 2019-12-15 DIAGNOSIS — G8929 Other chronic pain: Secondary | ICD-10-CM | POA: Diagnosis present

## 2019-12-15 DIAGNOSIS — L03115 Cellulitis of right lower limb: Secondary | ICD-10-CM | POA: Diagnosis present

## 2019-12-15 DIAGNOSIS — R220 Localized swelling, mass and lump, head: Secondary | ICD-10-CM | POA: Diagnosis present

## 2019-12-15 DIAGNOSIS — Y92019 Unspecified place in single-family (private) house as the place of occurrence of the external cause: Secondary | ICD-10-CM

## 2019-12-15 DIAGNOSIS — D638 Anemia in other chronic diseases classified elsewhere: Secondary | ICD-10-CM | POA: Diagnosis present

## 2019-12-15 DIAGNOSIS — T464X5A Adverse effect of angiotensin-converting-enzyme inhibitors, initial encounter: Secondary | ICD-10-CM | POA: Diagnosis not present

## 2019-12-15 DIAGNOSIS — E861 Hypovolemia: Secondary | ICD-10-CM | POA: Diagnosis not present

## 2019-12-15 DIAGNOSIS — E8809 Other disorders of plasma-protein metabolism, not elsewhere classified: Secondary | ICD-10-CM | POA: Diagnosis not present

## 2019-12-15 DIAGNOSIS — I69398 Other sequelae of cerebral infarction: Secondary | ICD-10-CM

## 2019-12-15 DIAGNOSIS — Z9181 History of falling: Secondary | ICD-10-CM

## 2019-12-15 DIAGNOSIS — J449 Chronic obstructive pulmonary disease, unspecified: Secondary | ICD-10-CM | POA: Diagnosis present

## 2019-12-15 DIAGNOSIS — R600 Localized edema: Secondary | ICD-10-CM | POA: Diagnosis not present

## 2019-12-15 DIAGNOSIS — L89152 Pressure ulcer of sacral region, stage 2: Secondary | ICD-10-CM | POA: Diagnosis present

## 2019-12-15 DIAGNOSIS — L03116 Cellulitis of left lower limb: Principal | ICD-10-CM | POA: Diagnosis present

## 2019-12-15 DIAGNOSIS — Z6841 Body Mass Index (BMI) 40.0 and over, adult: Secondary | ICD-10-CM

## 2019-12-15 DIAGNOSIS — L89312 Pressure ulcer of right buttock, stage 2: Secondary | ICD-10-CM | POA: Diagnosis present

## 2019-12-15 DIAGNOSIS — I10 Essential (primary) hypertension: Secondary | ICD-10-CM | POA: Diagnosis present

## 2019-12-15 DIAGNOSIS — R296 Repeated falls: Secondary | ICD-10-CM | POA: Diagnosis present

## 2019-12-15 DIAGNOSIS — I878 Other specified disorders of veins: Secondary | ICD-10-CM | POA: Diagnosis present

## 2019-12-15 DIAGNOSIS — F17210 Nicotine dependence, cigarettes, uncomplicated: Secondary | ICD-10-CM | POA: Diagnosis present

## 2019-12-15 DIAGNOSIS — R200 Anesthesia of skin: Secondary | ICD-10-CM | POA: Diagnosis present

## 2019-12-15 DIAGNOSIS — S90822A Blister (nonthermal), left foot, initial encounter: Secondary | ICD-10-CM | POA: Diagnosis present

## 2019-12-15 DIAGNOSIS — N170 Acute kidney failure with tubular necrosis: Secondary | ICD-10-CM | POA: Diagnosis not present

## 2019-12-15 DIAGNOSIS — Z79899 Other long term (current) drug therapy: Secondary | ICD-10-CM

## 2019-12-15 DIAGNOSIS — F1020 Alcohol dependence, uncomplicated: Secondary | ICD-10-CM | POA: Diagnosis present

## 2019-12-15 DIAGNOSIS — W19XXXA Unspecified fall, initial encounter: Secondary | ICD-10-CM | POA: Diagnosis present

## 2019-12-15 DIAGNOSIS — N179 Acute kidney failure, unspecified: Secondary | ICD-10-CM

## 2019-12-15 DIAGNOSIS — Z638 Other specified problems related to primary support group: Secondary | ICD-10-CM

## 2019-12-15 DIAGNOSIS — Z20822 Contact with and (suspected) exposure to covid-19: Secondary | ICD-10-CM | POA: Diagnosis present

## 2019-12-15 HISTORY — DX: Essential (primary) hypertension: I10

## 2019-12-15 HISTORY — DX: Chronic obstructive pulmonary disease, unspecified: J44.9

## 2019-12-15 NOTE — ED Triage Notes (Signed)
Pt brought to ED by [facility] from home for c/o right knee pain, pt states he had few falls. BP 144/92, HR 100, SPO2 99% RA.

## 2019-12-16 ENCOUNTER — Encounter (HOSPITAL_COMMUNITY): Payer: Self-pay | Admitting: Family Medicine

## 2019-12-16 ENCOUNTER — Emergency Department (HOSPITAL_COMMUNITY): Payer: Self-pay

## 2019-12-16 DIAGNOSIS — L03116 Cellulitis of left lower limb: Secondary | ICD-10-CM | POA: Diagnosis present

## 2019-12-16 DIAGNOSIS — L03115 Cellulitis of right lower limb: Secondary | ICD-10-CM

## 2019-12-16 LAB — CBC WITH DIFFERENTIAL/PLATELET
Abs Immature Granulocytes: 0.07 10*3/uL (ref 0.00–0.07)
Abs Immature Granulocytes: 0 10*3/uL (ref 0.00–0.07)
Basophils Absolute: 0.1 10*3/uL (ref 0.0–0.1)
Basophils Absolute: 0.1 10*3/uL (ref 0.0–0.1)
Basophils Relative: 1 %
Basophils Relative: 1 %
Eosinophils Absolute: 0.2 10*3/uL (ref 0.0–0.5)
Eosinophils Absolute: 0.2 10*3/uL (ref 0.0–0.5)
Eosinophils Relative: 2 %
Eosinophils Relative: 3 %
HCT: 36.9 % — ABNORMAL LOW (ref 39.0–52.0)
HCT: 34.4 % — ABNORMAL LOW (ref 39.0–52.0)
Hemoglobin: 12.5 g/dL — ABNORMAL LOW (ref 13.0–17.0)
Hemoglobin: 11.6 g/dL — ABNORMAL LOW (ref 13.0–17.0)
Immature Granulocytes: 1 %
Lymphocytes Relative: 15 %
Lymphocytes Relative: 15 %
Lymphs Abs: 1.1 10*3/uL (ref 0.7–4.0)
Lymphs Abs: 0.9 10*3/uL (ref 0.7–4.0)
MCH: 35.3 pg — ABNORMAL HIGH (ref 26.0–34.0)
MCH: 34.8 pg — ABNORMAL HIGH (ref 26.0–34.0)
MCHC: 33.9 g/dL (ref 30.0–36.0)
MCHC: 33.7 g/dL (ref 30.0–36.0)
MCV: 104.2 fL — ABNORMAL HIGH (ref 80.0–100.0)
MCV: 103.3 fL — ABNORMAL HIGH (ref 80.0–100.0)
Monocytes Absolute: 0.8 10*3/uL (ref 0.1–1.0)
Monocytes Absolute: 0.8 10*3/uL (ref 0.1–1.0)
Monocytes Relative: 12 %
Monocytes Relative: 14 %
Neutro Abs: 4.8 10*3/uL (ref 1.7–7.7)
Neutro Abs: 3.9 10*3/uL (ref 1.7–7.7)
Neutrophils Relative %: 69 %
Neutrophils Relative %: 67 %
Platelets: 300 10*3/uL (ref 150–400)
Platelets: 326 10*3/uL (ref 150–400)
RBC: 3.54 MIL/uL — ABNORMAL LOW (ref 4.22–5.81)
RBC: 3.33 MIL/uL — ABNORMAL LOW (ref 4.22–5.81)
RDW: 12.4 % (ref 11.5–15.5)
RDW: 12.3 % (ref 11.5–15.5)
WBC: 7.1 10*3/uL (ref 4.0–10.5)
WBC: 5.8 10*3/uL (ref 4.0–10.5)
nRBC: 0 % (ref 0.0–0.2)
nRBC: 0 % (ref 0.0–0.2)
nRBC: 0 /100 WBC

## 2019-12-16 LAB — BASIC METABOLIC PANEL
Anion gap: 9 (ref 5–15)
Anion gap: 7 (ref 5–15)
Anion gap: 9 (ref 5–15)
BUN: 24 mg/dL — ABNORMAL HIGH (ref 6–20)
BUN: 15 mg/dL (ref 6–20)
BUN: 14 mg/dL (ref 6–20)
CO2: 19 mmol/L — ABNORMAL LOW (ref 22–32)
CO2: 23 mmol/L (ref 22–32)
CO2: 21 mmol/L — ABNORMAL LOW (ref 22–32)
Calcium: 8.6 mg/dL — ABNORMAL LOW (ref 8.9–10.3)
Calcium: 8.4 mg/dL — ABNORMAL LOW (ref 8.9–10.3)
Calcium: 8.2 mg/dL — ABNORMAL LOW (ref 8.9–10.3)
Chloride: 94 mmol/L — ABNORMAL LOW (ref 98–111)
Chloride: 98 mmol/L (ref 98–111)
Chloride: 97 mmol/L — ABNORMAL LOW (ref 98–111)
Creatinine, Ser: 0.8 mg/dL (ref 0.61–1.24)
Creatinine, Ser: 0.67 mg/dL (ref 0.61–1.24)
Creatinine, Ser: 0.76 mg/dL (ref 0.61–1.24)
GFR calc Af Amer: >60 mL/min (ref >60)
GFR calc Af Amer: >60 mL/min (ref >60)
GFR calc Af Amer: >60 mL/min (ref >60)
GFR calc non Af Amer: >60 mL/min (ref >60)
GFR calc non Af Amer: >60 mL/min (ref >60)
GFR calc non Af Amer: >60 mL/min (ref >60)
Glucose, Bld: 105 mg/dL — ABNORMAL HIGH (ref 70–99)
Glucose, Bld: 129 mg/dL — ABNORMAL HIGH (ref 70–99)
Glucose, Bld: 119 mg/dL — ABNORMAL HIGH (ref 70–99)
Potassium: 5.8 mmol/L — ABNORMAL HIGH (ref 3.5–5.1)
Potassium: 4.3 mmol/L (ref 3.5–5.1)
Potassium: 4.2 mmol/L (ref 3.5–5.1)
Sodium: 122 mmol/L — ABNORMAL LOW (ref 135–145)
Sodium: 128 mmol/L — ABNORMAL LOW (ref 135–145)
Sodium: 127 mmol/L — ABNORMAL LOW (ref 135–145)

## 2019-12-16 LAB — URINALYSIS, ROUTINE W REFLEX MICROSCOPIC
Bilirubin Urine: NEGATIVE
Glucose, UA: NEGATIVE mg/dL
Hgb urine dipstick: NEGATIVE
Ketones, ur: NEGATIVE mg/dL
Leukocytes,Ua: NEGATIVE
Nitrite: NEGATIVE
Protein, ur: NEGATIVE mg/dL
Specific Gravity, Urine: 1.019 (ref 1.005–1.030)
pH: 5 (ref 5.0–8.0)

## 2019-12-16 LAB — CBC
HCT: 36.7 % — ABNORMAL LOW (ref 39.0–52.0)
Hemoglobin: 12.4 g/dL — ABNORMAL LOW (ref 13.0–17.0)
MCH: 35 pg — ABNORMAL HIGH (ref 26.0–34.0)
MCHC: 33.8 g/dL (ref 30.0–36.0)
MCV: 103.7 fL — ABNORMAL HIGH (ref 80.0–100.0)
Platelets: 329 10*3/uL (ref 150–400)
RBC: 3.54 MIL/uL — ABNORMAL LOW (ref 4.22–5.81)
RDW: 12.3 % (ref 11.5–15.5)
WBC: 5.3 10*3/uL (ref 4.0–10.5)
nRBC: 0 % (ref 0.0–0.2)

## 2019-12-16 LAB — BRAIN NATRIURETIC PEPTIDE: B Natriuretic Peptide: 49.1 pg/mL (ref 0.0–100.0)

## 2019-12-16 LAB — TROPONIN I (HIGH SENSITIVITY)
Troponin I (High Sensitivity): 6 ng/L (ref <18)
Troponin I (High Sensitivity): 6 ng/L (ref <18)

## 2019-12-16 LAB — LACTIC ACID, PLASMA: Lactic Acid, Venous: 1.7 mmol/L (ref 0.5–1.9)

## 2019-12-16 LAB — SARS CORONAVIRUS 2 BY RT PCR (HOSPITAL ORDER, PERFORMED IN ~~LOC~~ HOSPITAL LAB): SARS Coronavirus 2: NEGATIVE

## 2019-12-16 LAB — ETHANOL: Alcohol, Ethyl (B): <10 mg/dL (ref <10)

## 2019-12-16 MED ORDER — LORAZEPAM 2 MG/ML IJ SOLN: 0.0000 mg | Freq: Four times a day (QID) | INTRAMUSCULAR | Status: AC

## 2019-12-16 MED ORDER — VANCOMYCIN HCL IN DEXTROSE 1-5 GM/200ML-% IV SOLN
1000.0000 mg | Freq: Two times a day (BID) | INTRAVENOUS | Status: DC
  Administered 2019-12-16 – 2019-12-19 (×6): 1000 mg via INTRAVENOUS
  Filled 2019-12-16 (×8): qty 200

## 2019-12-16 MED ORDER — THIAMINE HCL 100 MG PO TABS
100.0000 mg | ORAL_TABLET | Freq: Every day | ORAL | Status: DC
  Administered 2019-12-16 – 2020-01-04 (×19): 100 mg via ORAL
  Filled 2019-12-16 (×18): qty 1

## 2019-12-16 MED ORDER — THIAMINE HCL 100 MG/ML IJ SOLN: 100.0000 mg | Freq: Every day | INTRAMUSCULAR | Status: DC

## 2019-12-16 MED ORDER — LORAZEPAM 2 MG/ML IJ SOLN: 0.0000 mg | Freq: Two times a day (BID) | INTRAMUSCULAR | Status: AC

## 2019-12-16 MED ORDER — LORAZEPAM 1 MG PO TABS
0.0000 mg | ORAL_TABLET | Freq: Four times a day (QID) | ORAL | Status: AC
  Administered 2019-12-16: 1 mg via ORAL
  Filled 2019-12-16: qty 2
  Filled 2019-12-16: qty 1

## 2019-12-16 MED ORDER — PROMETHAZINE HCL 25 MG PO TABS: 12.5000 mg | ORAL_TABLET | Freq: Four times a day (QID) | ORAL | Status: DC | PRN

## 2019-12-16 MED ORDER — THIAMINE HCL 100 MG PO TABS: 100.0000 mg | ORAL_TABLET | Freq: Every day | ORAL | Status: DC

## 2019-12-16 MED ORDER — VANCOMYCIN HCL 2000 MG/400ML IV SOLN
2000.0000 mg | INTRAVENOUS | Status: AC
  Administered 2019-12-16: 2000 mg via INTRAVENOUS
  Filled 2019-12-16: qty 400

## 2019-12-16 MED ORDER — FOLIC ACID 1 MG PO TABS
1.0000 mg | ORAL_TABLET | Freq: Every day | ORAL | Status: DC
  Administered 2019-12-17 – 2020-01-04 (×17): 1 mg via ORAL
  Filled 2019-12-16 (×17): qty 1

## 2019-12-16 MED ORDER — GABAPENTIN 100 MG PO CAPS
100.0000 mg | ORAL_CAPSULE | Freq: Two times a day (BID) | ORAL | Status: DC
  Administered 2019-12-16 – 2020-01-04 (×36): 100 mg via ORAL
  Filled 2019-12-16 (×36): qty 1

## 2019-12-16 MED ORDER — OXYCODONE HCL 5 MG PO TABS
5.0000 mg | ORAL_TABLET | ORAL | Status: DC | PRN
  Administered 2019-12-20: 5 mg via ORAL
  Filled 2019-12-16: qty 1

## 2019-12-16 MED ORDER — PIPERACILLIN-TAZOBACTAM 3.375 G IVPB 30 MIN
3.3750 g | INTRAVENOUS | Status: AC
  Administered 2019-12-16: 3.375 g via INTRAVENOUS
  Filled 2019-12-16: qty 50

## 2019-12-16 MED ORDER — ADULT MULTIVITAMIN W/MINERALS CH
1.0000 | ORAL_TABLET | Freq: Every day | ORAL | Status: DC
  Administered 2019-12-17 – 2020-01-04 (×17): 1 via ORAL
  Filled 2019-12-16 (×17): qty 1

## 2019-12-16 MED ORDER — SODIUM CHLORIDE 0.9 % IV SOLN: INTRAVENOUS | Status: DC

## 2019-12-16 MED ORDER — ACETAMINOPHEN 650 MG RE SUPP: 650.0000 mg | Freq: Four times a day (QID) | RECTAL | Status: DC | PRN

## 2019-12-16 MED ORDER — ASPIRIN EC 81 MG PO TBEC
81.0000 mg | DELAYED_RELEASE_TABLET | Freq: Every day | ORAL | Status: DC
  Administered 2019-12-16 – 2020-01-04 (×18): 81 mg via ORAL
  Filled 2019-12-16 (×18): qty 1

## 2019-12-16 MED ORDER — POLYETHYLENE GLYCOL 3350 17 G PO PACK: 17.0000 g | PACK | Freq: Every day | ORAL | Status: DC | PRN

## 2019-12-16 MED ORDER — PIPERACILLIN-TAZOBACTAM 3.375 G IVPB
3.3750 g | Freq: Three times a day (TID) | INTRAVENOUS | Status: DC
  Administered 2019-12-16 – 2019-12-20 (×13): 3.375 g via INTRAVENOUS
  Filled 2019-12-16 (×12): qty 50

## 2019-12-16 MED ORDER — LORAZEPAM 1 MG PO TABS: 0.0000 mg | ORAL_TABLET | Freq: Two times a day (BID) | ORAL | Status: AC

## 2019-12-16 MED ORDER — ACETAMINOPHEN 325 MG PO TABS
650.0000 mg | ORAL_TABLET | Freq: Four times a day (QID) | ORAL | Status: DC | PRN
  Administered 2019-12-22 – 2019-12-26 (×3): 650 mg via ORAL
  Filled 2019-12-16 (×3): qty 2

## 2019-12-16 MED ORDER — ENOXAPARIN SODIUM 40 MG/0.4ML ~~LOC~~ SOLN
40.0000 mg | SUBCUTANEOUS | Status: DC
  Administered 2019-12-16 – 2019-12-17 (×2): 40 mg via SUBCUTANEOUS
  Filled 2019-12-16 (×2): qty 0.4

## 2019-12-16 NOTE — ED Notes (Signed)
Attempted report 

## 2019-12-16 NOTE — Progress Notes (Signed)
PROGRESS NOTE    Robert Frank  KDX:[id number] DOB: [birth year] DOA: 12/15/2019 PCP: Patient, No Pcp Per     Brief Narrative:   54 y.o. WM PMHx  CVA and EtOH abuse   Presents to the emergency department for evaluation of right knee injury after a fall. Patient reports that he is having trouble walking, feels weak all over.   He has had several falls at home over the last few days.  He initially presented for evaluation of right knee pain after a fall yesterday afternoon.  He was admitted for similar complaints last week.  Patient has history of alcoholism.  He reportedly lives in a home that is extremely dirty and run down and home health will not go see it due to safety reasons reportedly.  He states he lives with his brother.  Reports he has been feeling very weak and after his fall earlier today he could not get up by himself and his brother helped him get up in the bed.  He has not been able to get up and ambulate or do any activities around the house.  He does have chronic back pain which still bothers him but is not worse.  He reports he has not drink any alcohol in a week.  He had hyponatremia last week when he was admitted.  Hyponatremia is most likely secondary to his alcoholism.  Reviewing his chart he has chronic hyponatremia.  He states he has not drink after his last discharge from the hospital.  He is oriented to person place and the month but he is unsure of the day of the week.  He is slow to respond and is a poor historian.  He does continue to smoke.  He denies having any fever.  He denies any burning with urination and denies any cough or shortness of breath.  He denies any loss of consciousness.  ED Course: In the emergency room patient is found to have bilateral lower extremity erythema and swelling.  When compared to last week when he was admitted from the pictures in the chart his erythema and swelling is worse.  He again has hyponatremia with a sodium of 122.  Hospital  service been asked to admit for further management   Subjective: Patient seen 0624 today by [facility]   Assessment & Plan: Covid vaccination;   Principal Problem:   Bilateral cellulitis of lower leg Active Problems:   COPD (chronic obstructive pulmonary disease) (HCC)   Alcoholism (HCC)   Generalized weakness   Hyponatremia   Bilateral lower leg cellulitis   Bilateral cellulitis of lower leg Mr. Garis is admitted to medical floor.  He is started on vancomycin and Zosyn empirically for worsening cellulitis of bilateral lower extremities. Will monitor for nephrotoxic effects of antibiotic coverage and adjust antibiotics if cause decreasing kidney function    Hyponatremia Secondary to alcoholism.  Patient placed on IV fluid with normal saline at 75 mils per hour. Monitor electrolytes and renal function.    COPD (chronic obstructive pulmonary disease) (HCC) RT to follow.  Incentive spirometer every 2 hours while awake.  DuoNebs as needed for shortness of breath, cough, will wheezing.    Generalized weakness Consult PT.  Will other staff ambulate with assistance.  Fall precautions placed.    Alcoholism (HCC) Consult social work.  Patient placed on CIWA protocol.  Provide Ativan as needed for elevated CIWA scores.  Patient reports he has not had any alcohol in the last week. Continue multivitamin, folic  acid and thiamine daily.    DVT prophylaxis: Lovenox Code Status: Full Family Communication:  Status is: Inpatient    Dispo: The patient is from: Home              Anticipated d/c is to: Home              Anticipated d/c date is: 7/14              Patient currently unstable      Consultants:    Procedures/Significant Events:    I have personally reviewed and interpreted all radiology studies and my findings are as above.  VENTILATOR SETTINGS:    Cultures   Antimicrobials: Anti-infectives (From admission, onward)   Start     Ordered Stop   12/16/19  1700  vancomycin (VANCOCIN) IVPB 1000 mg/200 mL premix     Discontinue     12/16/19 0628     12/16/19 1400  piperacillin-tazobactam (ZOSYN) IVPB 3.375 g     Discontinue     12/16/19 0628     12/16/19 0630  piperacillin-tazobactam (ZOSYN) IVPB 3.375 g        12/16/19 0628 12/16/19 0715   12/16/19 0345  vancomycin (VANCOREADY) IVPB 2000 mg/400 mL        12/16/19 0342 12/16/19 0642       Devices    LINES / TUBES:      Continuous Infusions: . piperacillin-tazobactam (ZOSYN)  IV    . vancomycin       Objective: Vitals:   12/15/19 1742 12/15/19 1951 12/16/19 0506 12/16/19 0726  BP: (!) 136/93 (!) 130/98 (!) 137/95 (!) 128/91  Pulse: (!) 105 (!) 109 (!) 111 (!) 108  Resp: 18 16 16 16   Temp: 98.3 F (36.8 C)  98.2 F (36.8 C) 98.3 F (36.8 C)  TempSrc: Oral  Oral Axillary  SpO2: 100% 100% 99% 98%    Intake/Output Summary (Last 24 hours) at 12/16/2019 0901 Last data filed at 12/16/2019 0715 Gross per 24 hour  Intake 46.67 ml  Output --  Net 46.67 ml   There were no vitals filed for this visit.  Examination: Previously examined by [facility].  General: WDWN, Alert and oriented x3.  Eyes: EOMI right eye, right pupil reactive to light.  Blind in left eye.  lids and conjunctivae normal.  Sclera nonicteric HENT:  [place]/AT, external ears normal.  Nares patent without epistasis.  Mucous membranes are moist. Posterior pharynx clear of any exudate or lesions. Normal dentition.  Neck: Soft, normal range of motion, supple, no masses, no thyromegaly.  Trachea midline Respiratory: clear to auscultation bilaterally, no wheezing, no crackles. Normal respiratory effort. No accessory muscle use.  Cardiovascular: Sinus tachycardia with a rate of 100-110. no murmurs / rubs / gallops.  Trace pedal pulses.  Abdomen: Soft, no tenderness, nondistended, no rebound or guarding.  No masses palpated. No hepatosplenomegaly. Bowel sounds   Musculoskeletal: Passive normal ROM upper extremities.  Normal  passive range of motion of lower extremities.  3+ edema of lower extremities bilaterally.  Has clubbing of digits.  No cyanosis. No joint deformity upper and lower extremities. Normal muscle tone.  Skin: Warm, dry, intact.  Indurated erythema of bilateral lower extremities.  Large clear vesicle on left foot at base of second and third toe with no drainage.  No ulcers.  Erythema blanches to touch.  No fluctuation. Neurologic: CN 2-12 grossly intact.  Normal speech.  Sensation intact, patella DTR +1 bilaterally. Strength 3/5 in all  extremities .     Data Reviewed: Care during the described time interval was provided by me .  I have reviewed this patient's available data, including medical history, events of note, physical examination, and all test results as part of my evaluation.  CBC: Recent Labs  Lab 12/10/19 0421 12/11/19 0318 12/12/19 0429 12/16/19 0151  WBC 7.4 7.4 7.1 7.1  NEUTROABS  --   --   --  4.8  HGB 13.5 13.0 12.5* 12.5*  HCT 40.5 39.2 38.1* 36.9*  MCV 105.5* 106.8* 106.7* 104.2*  PLT 405* 430* 423* 300   Basic Metabolic Panel: Recent Labs  Lab 12/09/19 1637 12/10/19 0421 12/11/19 0318 12/12/19 0429 12/16/19 0151  NA 132* 129* 132* 131* 122*  K 4.6 4.1 4.0 3.9 5.8*  CL 96* 96* 99 100 94*  CO2 32 26 25 25  19*  GLUCOSE 110* 102* 106* 107* 105*  BUN 18 14 14 14  24*  CREATININE 0.95 0.68 0.68 0.60* 0.80  CALCIUM 8.5* 8.5* 8.7* 8.5* 8.6*   GFR: Estimated Creatinine Clearance: 122.7 mL/min (by C-G formula based on SCr of 0.8 mg/dL). Liver Function Tests: No results for input(s): AST, ALT, ALKPHOS, BILITOT, PROT, ALBUMIN in the last 168 hours. No results for input(s): LIPASE, AMYLASE in the last 168 hours. No results for input(s): AMMONIA in the last 168 hours. Coagulation Profile: No results for input(s): INR, PROTIME in the last 168 hours. Cardiac Enzymes: No results for input(s): CKTOTAL, CKMB, CKMBINDEX, TROPONINI in the last 168 hours. BNP (last 3  results) No results for input(s): PROBNP in the last 8760 hours. HbA1C: No results for input(s): HGBA1C in the last 72 hours. CBG: No results for input(s): GLUCAP in the last 168 hours. Lipid Profile: No results for input(s): CHOL, HDL, LDLCALC, TRIG, CHOLHDL, LDLDIRECT in the last 72 hours. Thyroid Function Tests: No results for input(s): TSH, T4TOTAL, FREET4, T3FREE, THYROIDAB in the last 72 hours. Anemia Panel: No results for input(s): VITAMINB12, FOLATE, FERRITIN, TIBC, IRON, RETICCTPCT in the last 72 hours. Sepsis Labs: Recent Labs  Lab 12/16/19 0151  LATICACIDVEN 1.7    Recent Results (from the past 240 hour(s))  MRSA PCR Screening     Status: None   Collection Time: 12/06/19  6:04 PM   Specimen: Nasopharyngeal  Result Value Ref Range Status   MRSA by PCR NEGATIVE NEGATIVE Final    Comment:        The GeneXpert MRSA Assay (FDA approved for NASAL specimens only), is one component of a comprehensive MRSA colonization surveillance program. It is not intended to diagnose MRSA infection nor to guide or monitor treatment for MRSA infections. Performed at [facility], [address] [place], [place] [address]   SARS Coronavirus 2 by RT PCR (hospital order, performed in [facility] hospital lab) Nasopharyngeal Nasopharyngeal Swab     Status: None   Collection Time: 12/16/19  3:48 AM   Specimen: Nasopharyngeal Swab  Result Value Ref Range Status   SARS Coronavirus 2 NEGATIVE NEGATIVE Final    Comment: (NOTE) SARS-CoV-2 target nucleic acids are NOT DETECTED.  The SARS-CoV-2 RNA is generally detectable in upper and lower respiratory specimens during the acute phase of infection. The lowest concentration of SARS-CoV-2 viral copies this assay can detect is 250 copies / mL. A negative result does not preclude SARS-CoV-2 infection and should not be used as the sole basis for treatment or other patient management decisions.  A negative result may occur with improper  specimen collection /  handling, submission of specimen other than nasopharyngeal swab, presence of viral mutation(s) within the areas targeted by this assay, and inadequate number of viral copies (<250 copies / mL). A negative result must be combined with clinical observations, patient history, and epidemiological information.  Fact Sheet for Patients:   BoilerBrush.com.cy  Fact Sheet for Healthcare Providers: https://pope.com/  This test is not yet approved or  cleared by the [place] FDA and has been authorized for detection and/or diagnosis of SARS-CoV-2 by FDA under an Emergency Use Authorization (EUA).  This EUA will remain in effect (meaning this test can be used) for the duration of the COVID-19 declaration under Section 564(b)(1) of the Act, 21 U.S.C. section 360bbb-3(b)(1), unless the authorization is terminated or revoked sooner.  Performed at [facility], [address] [place], [place] [address]          Radiology Studies: DG Chest Port 1 View  Result Date: 12/16/2019 CLINICAL DATA:  Leg swelling EXAM: PORTABLE CHEST 1 VIEW COMPARISON:  12/04/2019 FINDINGS: Cardiac shadow is at the upper limits of normal in size is stable. Aortic calcifications are noted. Lungs are well aerated bilaterally. No focal infiltrate or sizable effusion is seen. Old rib fractures with pleural thickening are noted on the left stable from the prior exam. IMPRESSION: No acute abnormality noted. Electronically Signed   By: Alcide Clever M.D.   On: 12/16/2019 01:43   DG Knee Complete 4 Views Right  Result Date: 12/15/2019 CLINICAL DATA:  Fall with knee pain EXAM: RIGHT KNEE - COMPLETE 4+ VIEW COMPARISON:  None. FINDINGS: No acute displaced fracture or malalignment. Moderate patellofemoral and lateral joint space degenerative change with advanced disease at the medial joint space. No significant knee effusion. IMPRESSION: No acute osseous  abnormality. Electronically Signed   By: Jasmine Pang M.D.   On: 12/15/2019 19:28        Scheduled Meds: . LORazepam  0-4 mg Intravenous Q6H   Or  . LORazepam  0-4 mg Oral Q6H  . [START ON 12/18/2019] LORazepam  0-4 mg Intravenous Q12H   Or  . [START ON 12/18/2019] LORazepam  0-4 mg Oral Q12H  . thiamine  100 mg Oral Daily   Or  . thiamine  100 mg Intravenous Daily   Continuous Infusions: . piperacillin-tazobactam (ZOSYN)  IV    . vancomycin       LOS: 0 days    Time spent:40 min    Anisa Leanos, Roselind Messier, MD [facility] Pager [phone]  If 7PM-7AM, please contact night-coverage [email or web address] Password [email or web address] 12/16/2019, 9:01 AM

## 2019-12-16 NOTE — ED Provider Notes (Addendum)
[facility] EMERGENCY DEPARTMENT Provider Note   CSN: [id number] Arrival date & time: 12/15/19  1751     History Chief Complaint  Patient presents with  . Knee Pain    Robert Frank is a 54 y.o. male.  Patient with previous history of CVA and alcoholism presents to the emergency department for evaluation of right knee injury after a fall.  Patient reports that he is having trouble walking, feels weak all over.  This is caused several falls.  He comes to the ER from home by ambulance tonight to be evaluated for pain in the right knee after a fall.        Past Medical History:  Diagnosis Date  . Alcoholism (HCC)   . Blind left eye   . CVA (cerebral vascular accident) (HCC)    12 years ago- residual R sided numbness    Patient Active Problem List   Diagnosis Date Noted  . Hypokalemia 12/05/2019  . Hypomagnesemia 12/05/2019  . Generalized weakness 12/04/2019  . Transaminitis 12/04/2019  . Decubitus skin ulcer 12/04/2019  . Hyponatremia 12/04/2019  . Alcohol withdrawal (HCC) 08/17/2019  . Encephalopathy acute 08/17/2019  . Alcoholism (HCC)   . Cellulitis   . COPD exacerbation (HCC) 10/07/2018  . COPD (chronic obstructive pulmonary disease) (HCC) 10/06/2018  . COPD with acute exacerbation (HCC) 10/06/2018    Past Surgical History:  Procedure Laterality Date  . ANTERIOR CRUCIATE LIGAMENT REPAIR         No family history on file.  Social History   Tobacco Use  . Smoking status: Current Every Day Smoker    Packs/day: 1.00  . Smokeless tobacco: Never Used  Substance Use Topics  . Alcohol use: Yes    Comment: 5 40 oz/day  . Drug use: Not Currently    Home Medications Prior to Admission medications   Medication Sig Start Date End Date Taking? Authorizing Provider  feeding supplement, ENSURE ENLIVE, (ENSURE ENLIVE) LIQD Take 237 mLs by mouth 3 (three) times daily between meals. 12/12/19   Rolly Salter, MD  folic acid (FOLVITE) 1 MG  tablet Take 1 tablet (1 mg total) by mouth daily. 12/13/19   Rolly Salter, MD  gabapentin (NEURONTIN) 100 MG capsule Take 1 capsule (100 mg total) by mouth 2 (two) times daily. 12/12/19   Rolly Salter, MD  lactulose (CHRONULAC) 10 GM/15ML solution Take 30 mLs (20 g total) by mouth daily. 12/13/19   Rolly Salter, MD  Multiple Vitamin (MULTIVITAMIN WITH MINERALS) TABS tablet Take 1 tablet by mouth daily. 12/13/19   Rolly Salter, MD  naproxen sodium (ALEVE) 220 MG tablet Take 220 mg by mouth 2 (two) times daily as needed (knee pain).    [provider]  nicotine (NICODERM CQ - DOSED IN MG/24 HOURS) 14 mg/24hr patch Place 1 patch (14 mg total) onto the skin daily. 12/13/19   Rolly Salter, MD  nutrition supplement, Heinz Knuckles, He[employer] Knuckles) PACK Take 1 packet by mouth 2 (two) times daily between meals. 12/12/19   Rolly Salter, MD  thiamine 100 MG tablet Take 1 tablet (100 mg total) by mouth daily. 12/13/19   Rolly Salter, MD    Allergies    Patient has no known allergies.  Review of Systems   Review of Systems  Constitutional: Positive for fatigue.  Musculoskeletal: Positive for arthralgias.  Skin: Positive for wound.  All other systems reviewed and are negative.   Physical Exam Updated Vital Signs BP Marland Kitchen)  130/98 (BP Location: Right Arm)   Pulse (!) 109   Temp 98.3 F (36.8 C) (Oral)   Resp 16   SpO2 100%   Physical Exam Vitals and nursing note reviewed.  Constitutional:      General: He is not in acute distress.    Appearance: He is well-developed.     Comments: Disheveled and dirty  HENT:     Head: Normocephalic and atraumatic.     Right Ear: Hearing normal.     Left Ear: Hearing normal.     Nose: Nose normal.  Eyes:     Conjunctiva/sclera: Conjunctivae normal.     Pupils: Pupils are equal, round, and reactive to light.  Cardiovascular:     Rate and Rhythm: Regular rhythm. Tachycardia present.     Heart sounds: S1 normal and S2 normal. No murmur heard.  No friction  rub. No gallop.   Pulmonary:     Effort: Pulmonary effort is normal. No respiratory distress.     Breath sounds: Normal breath sounds.  Chest:     Chest wall: No tenderness.  Abdominal:     General: Bowel sounds are normal.     Palpations: Abdomen is soft.     Tenderness: There is no abdominal tenderness. There is no guarding or rebound. Negative signs include Murphy's sign and McBurney's sign.     Hernia: No hernia is present.  Musculoskeletal:        General: Normal range of motion.     Cervical back: Normal range of motion and neck supple.     Right lower leg: Edema present.     Left lower leg: Edema present.  Skin:    General: Skin is warm and dry.     Findings: Erythema (And warmth bilateral lower extremities) and wound (left lateral lower leg with drainage) present. No rash.  Neurological:     Mental Status: He is alert and oriented to person, place, and time.     GCS: GCS eye subscore is 4. GCS verbal subscore is 5. GCS motor subscore is 6.     Cranial Nerves: No cranial nerve deficit.     Sensory: No sensory deficit.     Coordination: Coordination normal.  Psychiatric:        Speech: Speech normal.        Behavior: Behavior normal.        Thought Content: Thought content normal.         ED Results / Procedures / Treatments   Labs (all labs ordered are listed, but only abnormal results are displayed) Labs Reviewed  CBC WITH DIFFERENTIAL/PLATELET - Abnormal; Notable for the following components:      Result Value   RBC 3.54 (*)    Hemoglobin 12.5 (*)    HCT 36.9 (*)    MCV 104.2 (*)    MCH 35.3 (*)    All other components within normal limits  BASIC METABOLIC PANEL - Abnormal; Notable for the following components:   Sodium 122 (*)    Potassium 5.8 (*)    Chloride 94 (*)    CO2 19 (*)    Glucose, Bld 105 (*)    BUN 24 (*)    Calcium 8.6 (*)    All other components within normal limits  URINALYSIS, ROUTINE W REFLEX MICROSCOPIC - Abnormal; Notable for the  following components:   Color, Urine AMBER (*)    APPearance HAZY (*)    All other components within normal limits  CULTURE, BLOOD (  ROUTINE X 2)  CULTURE, BLOOD (ROUTINE X 2)  SARS CORONAVIRUS 2 BY RT PCR (HOSPITAL ORDER, PERFORMED IN [facility])  LACTIC ACID, PLASMA  BRAIN NATRIURETIC PEPTIDE  TROPONIN I (HIGH SENSITIVITY)  TROPONIN I (HIGH SENSITIVITY)    EKG None  Radiology DG Chest Port 1 View  Result Date: 12/16/2019 CLINICAL DATA:  Leg swelling EXAM: PORTABLE CHEST 1 VIEW COMPARISON:  12/04/2019 FINDINGS: Cardiac shadow is at the upper limits of normal in size is stable. Aortic calcifications are noted. Lungs are well aerated bilaterally. No focal infiltrate or sizable effusion is seen. Old rib fractures with pleural thickening are noted on the left stable from the prior exam. IMPRESSION: No acute abnormality noted. Electronically Signed   By: Alcide Clever M.D.   On: 12/16/2019 01:43   DG Knee Complete 4 Views Right  Result Date: 12/15/2019 CLINICAL DATA:  Fall with knee pain EXAM: RIGHT KNEE - COMPLETE 4+ VIEW COMPARISON:  None. FINDINGS: No acute displaced fracture or malalignment. Moderate patellofemoral and lateral joint space degenerative change with advanced disease at the medial joint space. No significant knee effusion. IMPRESSION: No acute osseous abnormality. Electronically Signed   By: Jasmine Pang M.D.   On: 12/15/2019 19:28    Procedures Procedures (including critical care time)  Medications Ordered in ED Medications - No data to display  ED Course  I have reviewed the triage vital signs and the nursing notes.  Pertinent labs & imaging results that were available during my care of the patient were reviewed by me and considered in my medical decision making (see chart for details).    MDM Rules/Calculators/A&P                          Patient presented to the emergency department for evaluation of fall.  Patient reports several falls since he  left the hospital a week and half ago.  He is complaining of right knee pain.  X-ray does not show fracture.  No significant pathology noted on examination.  Patient is, however, noted to have significant swelling of lower extremities.  Referring to images from his previous hospitalization, he has had significant worsening of bilateral lower extremity edema.  There is now significant erythema and warmth.  He has chronic wounds on the backs of both of his legs and there is significant blistering of the dorsal aspect of the left foot, not seen previously.  Patient also noted to have recurrence of his moderate to severe hyponatremia.  We will therefore hold off on any Lasix at this time.  Will require hospitalization for further management of hyponatremia, chronic lower extremity edema with suspected cellulitis.  Final Clinical Impression(s) / ED Diagnoses Final diagnoses:  Hyponatremia  Cellulitis of both lower extremities    Rx / DC Orders ED Discharge Orders    None       Aviel Davalos, Canary Brim, MD 12/16/19 0335    Gilda Crease, MD 12/16/19 0600

## 2019-12-16 NOTE — Progress Notes (Signed)
The patient was received from the Emergency Room via stretcher.  The patient was covered in urine saturated clothing from chest down.  The patient had dried feces also.  The ER tech assisted with cleaning the patient.

## 2019-12-16 NOTE — Evaluation (Signed)
Physical Therapy Evaluation Patient Details Name: Robert Frank MRN: [id number] DOB: [birth year] Today's Date: 12/16/2019   History of Present Illness  54 y.o. male with medical history significant of CVA and alcoholism presents to the emergency department for evaluation of right knee injury after a fall.  Patient reports that he is having trouble walking, feels weak all over.    He has had several falls at home over the last few days.  He initially presented for evaluation of right knee pain after a fall yesterday afternoon.  He was admitted for similar complaints last week.  Patient has history of alcoholism.  Clinical Impression  Pt presents to PT with deficits in functional mobility, gait, balance, strength, power, endurance, sensation. Pt is generally weak, requiring significant physical assistance for all OOB mobility. Pt's activity tolerance is greatly limited by LE pain. Pt reports a significant falls history, and with weakness, sensation deficits, and imbalance, remains a high falls risk at this time. PT recommending SNF placement to reduce falls risk and to improve safety.    Follow Up Recommendations SNF    Equipment Recommendations  Wheelchair (measurements PT);Wheelchair cushion (measurements PT)    Recommendations for Other Services       Precautions / Restrictions Precautions Precautions: Fall Precaution Comments: multiple wounds on buttocks and LEs Restrictions Weight Bearing Restrictions: No      Mobility  Bed Mobility Overal bed mobility: Needs Assistance Bed Mobility: Supine to Sit     Supine to sit: Supervision;HOB elevated        Transfers Overall transfer level: Needs assistance Equipment used: Rolling walker (2 wheeled) Transfers: Sit to/from Stand Sit to Stand: Mod assist         General transfer comment: pt requires modA to power up from elevated bed  Ambulation/Gait Ambulation/Gait assistance: Min assist Gait Distance (Feet): 4  Feet Assistive device: Rolling walker (2 wheeled) Gait Pattern/deviations: Step-to pattern Gait velocity: reduced Gait velocity interpretation: <1.31 ft/sec, indicative of household ambulator General Gait Details: short shuffling steps from bed to recliner, requires PT cues for safety during transfer  Stairs            Wheelchair Mobility    Modified Rankin (Stroke Patients Only)       Balance Overall balance assessment: Needs assistance Sitting-balance support: Feet supported;No upper extremity supported Sitting balance-Leahy Scale: Fair     Standing balance support: Bilateral upper extremity supported Standing balance-Leahy Scale: Poor Standing balance comment: reliant on BUE support of RW and minA                             Pertinent Vitals/Pain Pain Assessment: Faces Faces Pain Scale: Hurts whole lot Pain Location: BLE to touch Pain Descriptors / Indicators: Grimacing Pain Intervention(s): Monitored during session    Home Living Family/patient expects to be discharged to:: Private residence Living Arrangements: Other relatives (brother, also recently hospitalized) Available Help at Discharge: Family;Available PRN/intermittently Type of Home: House Home Access: Stairs to enter Entrance Stairs-Rails: Can reach both Entrance Stairs-Number of Steps: 3 Home Layout: One level Home Equipment: Walker - 2 wheels;Bedside commode      Prior Function Level of Independence: Independent with assistive device(s)         Comments: pt reports ambulating very short household distances with RW, has fallen close to 10 times in less than a week since last admission     Hand Dominance   Dominant Hand: Right  Extremity/Trunk Assessment   Upper Extremity Assessment Upper Extremity Assessment: Generalized weakness    Lower Extremity Assessment Lower Extremity Assessment: Generalized weakness (multiple BLE wounds and blisters)    Cervical / Trunk  Assessment Cervical / Trunk Assessment: Kyphotic  Communication   Communication: HOH  Cognition Arousal/Alertness: Awake/alert Behavior During Therapy: Flat affect Overall Cognitive Status: Impaired/Different from baseline Area of Impairment: Attention;Memory;Safety/judgement;Awareness;Problem solving                   Current Attention Level: Sustained Memory: Decreased recall of precautions Following Commands: Follows one step commands consistently Safety/Judgement: Decreased awareness of safety;Decreased awareness of deficits Awareness: Emergent Problem Solving: Slow processing        General Comments General comments (skin integrity, edema, etc.): VSS on RA    Exercises     Assessment/Plan    PT Assessment Patient needs continued PT services  PT Problem List Decreased strength;Decreased activity tolerance;Decreased balance;Decreased mobility;Decreased cognition;Decreased knowledge of use of DME;Decreased safety awareness;Decreased knowledge of precautions;Impaired sensation;Pain       PT Treatment Interventions DME instruction;Gait training;Stair training;Functional mobility training;Therapeutic activities;Therapeutic exercise;Balance training;Cognitive remediation;Neuromuscular re-education;Patient/family education    PT Goals (Current goals can be found in the Care Plan section)  Acute Rehab PT Goals Patient Stated Goal: Pt goal is to get on disability, PT goal to improve mobility quality PT Goal Formulation: With patient Time For Goal Achievement: 12/30/19 Potential to Achieve Goals: Fair    Frequency Min 2X/week   Barriers to discharge        Co-evaluation               AM-PAC PT "6 Clicks" Mobility  Outcome Measure Help needed turning from your back to your side while in a flat bed without using bedrails?: None Help needed moving from lying on your back to sitting on the side of a flat bed without using bedrails?: None Help needed moving to  and from a bed to a chair (including a wheelchair)?: A Lot Help needed standing up from a chair using your arms (e.g., wheelchair or bedside chair)?: A Lot Help needed to walk in hospital room?: A Little Help needed climbing 3-5 steps with a railing? : A Lot 6 Click Score: 17    End of Session   Activity Tolerance: Patient limited by pain;Patient limited by fatigue Patient left: in chair;with call bell/phone within reach;with chair alarm set Nurse Communication: Mobility status;Need for lift equipment (STEDY) PT Visit Diagnosis: Unsteadiness on feet (R26.81);Other abnormalities of gait and mobility (R26.89);History of falling (Z91.81);Pain Pain - part of body: Leg (BLE)    Time: 1275-1700 PT Time Calculation (min) (ACUTE ONLY): 21 min   Charges:   PT Evaluation $PT Eval Moderate Complexity: 1 Mod          Arlyss Gandy, PT, DPT Acute Rehabilitation Pager: [phone]   Arlyss Gandy 12/16/2019, 4:36 PM

## 2019-12-16 NOTE — Discharge Summary (Signed)
[facility] Discharge Summary   Patient: Robert Frank ZYS:[id number]  PCP: Patient, No Pcp Per  Date of admission: 12/04/2019   Date of discharge: 12/12/2019      Discharge Diagnoses:   Principal Problem:   Hyponatremia Active Problems:   COPD (chronic obstructive pulmonary disease) (HCC)   Alcoholism (HCC)   Generalized weakness   Transaminitis   Decubitus skin ulcer   Hypokalemia   Hypomagnesemia   Admitted From: home Disposition:  Home TOC team unable to find a placement option  Recommendations for Outpatient Follow-up:  1. PCP: please follow up in 1 week 2. Follow up LABS/TEST:  none   Follow-up Information    [facility]. Call.   Contact information: [address] [place] [address] [phone]             Diet recommendation: Cardiac diet  Activity: The patient is advised to gradually reintroduce usual activities, as tolerated  Discharge Condition: stable  Code Status: Full code   History of present illness: As per the H and P dictated on admission, "Robert Frank is a 54 y.o. male with medical history significant of EtOH abuse that is ongoing, COPD, prior stroke with residual R sided numbness.  Pt has been feeling generally week the past few days to point where he couldn't get out of bed at home.  He was found at home, reportedly on floor for 3 days (though pt reports in bed for 3 days), unable to move since that time.  He thought he had sciatica, had friend pick him up and put him in bed.  He is oriented to person place and the month but not the day of the week. Unable to provide any additional supplemental history. He is a current smoker. EMS reports squalid living conditions on arrival on scene.  Pt admits to drinking heavily, amount "depends on how much I can get my hands on"."  Hospital Course:  Summary of his active problems in the hospital is as following.  1. Symptomatic  hyponatremia Hypokalemia hypomagensemia.  Resolved  Na 123, K 3,0, Mg 1.7. on admission  Patient was initially given IV fluids, later on as become volume overloaded needed lasix. Now sodium stable and volume stable as well.   2. Etoh abuse.  Patient with chronic alcohol use and abuse.  No clinical signs of acute withdrawal.  Continue with thiamine, and multivitamins.  3. Pretibial pressure ulcer stage 2.  Present on admission,  continue with local wound care.   4. Elevated liver enzymes.  AST 165 and ALT 108. T Bil 1,3.  Liver US with steatosis.   Negative hepatitis panel  5.  Cellulitis of the leg. Not improving with Augmentin. Add doxycycline. Lower extremity Doppler negative for DVT. Add gabapentin for pain. Unremarkable x-ray tibia-fibula. CK normal  6.  Acute hypoxic respiratory failure resolved  Likely due to acute HFpEF Chest x-ray shows bilateral atelectasis. Patient was on 3 L of oxygen , improved to room air after IV lasix.   Body mass index is 35.05 kg/m.  Nutrition Problem: Increased nutrient needs Etiology: wound healing Nutrition Interventions: Interventions: Ensure Enlive (each supplement provides 350kcal and 20 grams of protein), MVI, Magic cup  Pressure Injury 12/05/19 Sacrum Medial Stage 2 -  Partial thickness loss of dermis presenting as a shallow open injury with a red, pink wound bed without slough. (Active)  12/05/19 1400  Location: Sacrum  Location Orientation: Medial  Staging: Stage 2 -  Partial  thickness loss of dermis presenting as a shallow open injury with a red, pink wound bed without slough.  Wound Description (Comments):   Present on Admission: Yes     Pressure Injury 12/05/19 Buttocks Right Stage 3 -  Full thickness tissue loss. Subcutaneous fat may be visible but bone, tendon or muscle are NOT exposed. (Active)  12/05/19 1400  Location: Buttocks  Location Orientation: Right  Staging: Stage 3 -  Full thickness tissue loss.  Subcutaneous fat may be visible but bone, tendon or muscle are NOT exposed.  Wound Description (Comments):   Present on Admission: Yes     Pressure Injury 12/05/19 Buttocks Left;Upper Deep Tissue Pressure Injury - Purple or maroon localized area of discolored intact skin or blood-filled blister due to damage of underlying soft tissue from pressure and/or shear. (Active)  12/05/19 1400  Location: Buttocks  Location Orientation: Left;Upper  Staging: Deep Tissue Pressure Injury - Purple or maroon localized area of discolored intact skin or blood-filled blister due to damage of underlying soft tissue from pressure and/or shear.  Wound Description (Comments):   Present on Admission: Yes    Patient was seen by physical therapy, who recommended SNF, TOC team unable to find placement due to insurance, home health was arranged. On the day of the discharge the patient's vitals were stable, and no other acute medical condition were reported by patient. the patient was felt safe to be discharge at Home with Home health.  Consultants: none Procedures: none  Discharge Exam: General: Appear in no distress, bilateral leg redness, no other Rash; Oral Mucosa Clear, moist. Cardiovascular: S1 and S2 Present, no Murmur, Respiratory: normal respiratory effort, Bilateral Air entry present and no Crackles, no wheezes Abdomen: Bowel Sound present, Soft and no tenderness, no hernia Extremities: bilateral  Pedal edema, no calf tenderness Neurology: alert and oriented to time, place, and person affect flat in affect.  Filed Weights   12/09/19 2110 12/10/19 2146 12/12/19 0449  Weight: 102.3 kg 101.3 kg 101.5 kg   Vitals:   12/12/19 0449 12/12/19 0903  BP: 114/71 128/76  Pulse: 96 88  Resp: 18 17  Temp: 98 F (36.7 C) 98.4 F (36.9 C)  SpO2: 95% 93%    DISCHARGE MEDICATION: Allergies as of 12/12/2019   No Known Allergies     Medication List    STOP taking these medications   albuterol 108 (90 Base)  MCG/ACT inhaler Commonly known as: VENTOLIN HFA   amLODipine 5 MG tablet Commonly known as: NORVASC     TAKE these medications   feeding supplement (ENSURE ENLIVE) Liqd Take 237 mLs by mouth 3 (three) times daily between meals.   nutrition supplement (JUVEN) Pack Take 1 packet by mouth 2 (two) times daily between meals.   folic acid 1 MG tablet Commonly known as: FOLVITE Take 1 tablet (1 mg total) by mouth daily.   gabapentin 100 MG capsule Commonly known as: NEURONTIN Take 1 capsule (100 mg total) by mouth 2 (two) times daily.   lactulose 10 GM/15ML solution Commonly known as: CHRONULAC Take 30 mLs (20 g total) by mouth daily.   multivitamin with minerals Tabs tablet Take 1 tablet by mouth daily.   naproxen sodium 220 MG tablet Commonly known as: ALEVE Take 220 mg by mouth 2 (two) times daily as needed (knee pain).   nicotine 14 mg/24hr patch Commonly known as: NICODERM CQ - dosed in mg/24 hours Place 1 patch (14 mg total) onto the skin daily.   thiamine 100 MG  tablet Take 1 tablet (100 mg total) by mouth daily.     ASK your doctor about these medications   doxycycline 100 MG tablet Commonly known as: VIBRA-TABS Take 1 tablet (100 mg total) by mouth 2 (two) times daily for 2 days. Ask about: Should I take this medication?            Discharge Care Instructions  (From admission, onward)         Start     Ordered   12/12/19 0000  Discharge wound care:       Comments: Xeroform to the LE wounds, top with dry dressings. Secure with kerlix or conform. Change daily   12/12/19 1228         No Known Allergies Discharge Instructions    Diet - low sodium heart healthy   Complete by: As directed    Discharge wound care:   Complete by: As directed    Xeroform to the LE wounds, top with dry dressings. Secure with kerlix or conform. Change daily   Increase activity slowly   Complete by: As directed       The results of significant diagnostics from this  hospitalization (including imaging, microbiology, ancillary and laboratory) are listed below for reference.    Significant Diagnostic Studies: DG Tibia/Fibula Left  Result Date: 12/11/2019 CLINICAL DATA:  Left leg and foot pain for 1 day EXAM: LEFT TIBIA AND FIBULA - 2 VIEW COMPARISON:  None. FINDINGS: Diffuse edematous changes of the lower extremity. No soft tissue gas or foreign body. Scattered punctate calcifications in the superficial soft tissues may reflect sequela of chronic venous stasis. The tibia and fibula are intact. No worrisome osseous lesions. No acute bony abnormality. Specifically, no fracture, subluxation, or dislocation. Mild degenerative changes noted at the knee and ankle IMPRESSION: 1. Diffuse edematous changes of the lower extremity without soft tissue gas or foreign body. No acute osseous abnormality. 2. Mild degenerative changes at the knee and ankle. Electronically Signed   By: Kreg Shropshire M.D.   On: 12/11/2019 19:05    DG Chest Port 1 View  Result Date: 12/04/2019 CLINICAL DATA:  Tachycardia, weakness, found on ground for 3 days, unable to take care of himself EXAM: PORTABLE CHEST 1 VIEW COMPARISON:  Portable exam 1750 hours compared to 08/16/2019 FINDINGS: Upper normal heart size. Mediastinal contours and pulmonary vascularity normal. Atherosclerotic calcification aorta. Bibasilar atelectasis with slight chronic accentuation of LEFT basilar markings. Remaining lungs clear. No infiltrate, pleural effusion or pneumothorax. IMPRESSION: Bibasilar atelectasis. Electronically Signed   By: Ulyses Southward M.D.   On: 12/04/2019 18:00   DG Foot Complete Left  Result Date: 12/11/2019 CLINICAL DATA:  Foot pain for 1 day.  No trauma. EXAM: LEFT FOOT - COMPLETE 3+ VIEW COMPARISON:  None. FINDINGS: There is no evidence of fracture or dislocation. Hallux valgus is seen with mild degenerative changes of the metatarsal-phalangeal joint and interphalangeal joint of the first digit. There is a  small plantar calcaneal enthesophyte. Soft tissues are unremarkable. IMPRESSION: No acute findings. Hallux valgus with mild degenerative changes. Electronically Signed   By: Romona Curls M.D.   On: 12/11/2019 19:05   VAS [place] LOWER EXTREMITY VENOUS (DVT)  Result Date: 12/07/2019  Lower Venous DVTStudy Indications: Edema.  Comparison Study: none Performing Technologist: Jeb Levering RDMS, RVT  Examination Guidelines: A complete evaluation includes B-mode imaging, spectral Doppler, color Doppler, and power Doppler as needed of all accessible portions of each vessel. Bilateral testing is considered an integral part  of a complete examination. Limited examinations for reoccurring indications may be performed as noted. The reflux portion of the exam is performed with the patient in reverse Trendelenburg.  +---------+---------------+---------+-----------+----------+--------------+ RIGHT    CompressibilityPhasicitySpontaneityPropertiesThrombus Aging +---------+---------------+---------+-----------+----------+--------------+ CFV      Full           Yes      Yes                                 +---------+---------------+---------+-----------+----------+--------------+ SFJ      Full                                                        +---------+---------------+---------+-----------+----------+--------------+ FV Prox  Full                                                        +---------+---------------+---------+-----------+----------+--------------+ FV Mid   Full                                                        +---------+---------------+---------+-----------+----------+--------------+ FV DistalFull                                                        +---------+---------------+---------+-----------+----------+--------------+ PFV      Full                                                         +---------+---------------+---------+-----------+----------+--------------+ POP      Full           Yes      Yes                                 +---------+---------------+---------+-----------+----------+--------------+ PTV      Full                                                        +---------+---------------+---------+-----------+----------+--------------+ PERO     Full                                                        +---------+---------------+---------+-----------+----------+--------------+   +---------+---------------+---------+-----------+----------+--------------+ LEFT     CompressibilityPhasicitySpontaneityPropertiesThrombus Aging +---------+---------------+---------+-----------+----------+--------------+ CFV      Full  Yes      Yes                                 +---------+---------------+---------+-----------+----------+--------------+ SFJ      Full                                                        +---------+---------------+---------+-----------+----------+--------------+ FV Prox  Full                                                        +---------+---------------+---------+-----------+----------+--------------+ FV Mid   Full                                                        +---------+---------------+---------+-----------+----------+--------------+ FV DistalFull                                                        +---------+---------------+---------+-----------+----------+--------------+ PFV      Full                                                        +---------+---------------+---------+-----------+----------+--------------+ POP      Full           Yes      Yes                                 +---------+---------------+---------+-----------+----------+--------------+ PTV      Full                                                         +---------+---------------+---------+-----------+----------+--------------+ PERO     Full                                                        +---------+---------------+---------+-----------+----------+--------------+     Summary: RIGHT: - There is no evidence of deep vein thrombosis in the lower extremity.  - No cystic structure found in the popliteal fossa.  LEFT: - There is no evidence of deep vein thrombosis in the lower extremity.  - No cystic structure found in the popliteal fossa. - Ultrasound characteristics of enlarged lymph nodes noted in the groin.  *See table(s) above for measurements and  observations. Electronically signed by Coral Else MD on 12/07/2019 at 8:51:11 PM.    Final    US Abdomen Limited RUQ  Result Date: 12/04/2019 CLINICAL DATA:  Elevated LFTs EXAM: ULTRASOUND ABDOMEN LIMITED RIGHT UPPER QUADRANT COMPARISON:  None. FINDINGS: Gallbladder: No gallstones or wall thickening visualized. No sonographic Murphy sign noted by sonographer. Common bile duct: Diameter: 5 mm Liver: Diffuse increased echogenicity with slightly heterogeneous liver. Appearance typically secondary to fatty infiltration. Fibrosis secondary consideration. No secondary findings of cirrhosis noted. No focal hepatic lesion or intrahepatic biliary duct dilatation. Portal vein is patent on color Doppler imaging with normal direction of blood flow towards the liver. Other: None. IMPRESSION: Hepatic steatosis.  No acute abnormality. Electronically Signed   By: Katherine Mantle M.D.   On: 12/04/2019 23:07    Microbiology: Recent Results (from the past 240 hour(s))  MRSA PCR Screening     Status: None   Collection Time: 12/06/19  6:04 PM   Specimen: Nasopharyngeal  Result Value Ref Range Status   MRSA by PCR NEGATIVE NEGATIVE Final    Comment:        The GeneXpert MRSA Assay (FDA approved for NASAL specimens only), is one component of a comprehensive MRSA colonization surveillance program. It is  not intended to diagnose MRSA infection nor to guide or monitor treatment for MRSA infections. Performed at [facility], [address] [place], [place] [address]      Labs: CBC: Lab 12/10/19 0421 12/11/19 0318 12/12/19 0429  WBC 7.4 7.4 7.1  NEUTROABS  --   --   --   HGB 13.5 13.0 12.5*  HCT 40.5 39.2 38.1*  MCV 105.5* 106.8* 106.7*  PLT 405* 430* 423*   Basic Metabolic Panel: Lab 12/09/19 1637 12/10/19 0421 12/11/19 0318 12/12/19 0429  NA 132* 129* 132* 131*  K 4.6 4.1 4.0 3.9  CL 96* 96* 99 100  CO2 32 GLUCOSE 110* 102* 106* 107*  BUN CREATININE 0.95 0.68 0.68 0.60*  CALCIUM 8.5* 8.5* 8.7* 8.5*   CBG: No results for input(s): GLUCAP in the last 168 hours.  Time spent: 35 minutes  Signed:  Lynden Oxford  [facility] 12/12/2019 8:46 AM

## 2019-12-16 NOTE — Progress Notes (Signed)
Pharmacy Antibiotic Note  EUSTACE HUR is a 54 y.o. male admitted on 12/15/2019 with cellulitis.  Pharmacy has been consulted for Vancomycin and Zosyn dosing.   Vanc load of 2 gm given in ED  Plan: Zosyn 3.375gm IV q8h Vancomycin 1000 mg IV Q 12 hrs.  Will f/u renal function, micro data, and pt's clinical condition Vanc levels prn    Temp (24hrs), Avg:98.3 F (36.8 C), Min:98.2 F (36.8 C), Max:98.3 F (36.8 C)  Recent Labs  Lab 12/09/19 1637 12/10/19 0421 12/11/19 0318 12/12/19 0429 12/16/19 0151  WBC  --  7.4 7.4 7.1 7.1  CREATININE 0.95 0.68 0.68 0.60* 0.80  LATICACIDVEN  --   --   --   --  1.7    Estimated Creatinine Clearance: 122.7 mL/min (by C-G formula based on SCr of 0.8 mg/dL).    No Known Allergies  Antimicrobials this admission: 7/9 Vanc >>  7/9 Zosyn >>   Microbiology results: 7/9 BCx:  Thank you for allowing pharmacy to be a part of this patient's care.  Christoper Fabian, PharmD, BCPS Please see amion for complete clinical pharmacist phone list 12/16/2019 6:25 AM

## 2019-12-16 NOTE — H&P (Signed)
History and Physical    Robert Frank IPJ:[id number] DOB: 05/23/1966 DOA: 12/15/2019  PCP: Patient, No Pcp Per  Patient coming from: home   Chief Complaint:  Weakness and cannot walk. Increased redness of legs  HPI: Robert Frank is a 54 y.o. male with medical history significant of CVA and alcoholism presents to the emergency department for evaluation of right knee injury after a fall.  Patient reports that he is having trouble walking, feels weak all over.    He has had several falls at home over the last few days.  He initially presented for evaluation of right knee pain after a fall yesterday afternoon.  He was admitted for similar complaints last week.  Patient has history of alcoholism.  He reportedly lives in a home that is extremely dirty and run down and home health will not go see it due to safety reasons reportedly.  He states he lives with his brother.  Reports he has been feeling very weak and after his fall earlier today he could not get up by himself and his brother helped him get up in the bed.  He has not been able to get up and ambulate or do any activities around the house.  He does have chronic back pain which still bothers him but is not worse.  He reports he has not drink any alcohol in a week.  He had hyponatremia last week when he was admitted.  Hyponatremia is most likely secondary to his alcoholism.  Reviewing his chart he has chronic hyponatremia.  He states he has not drink after his last discharge from the hospital.  He is oriented to person place and the month but he is unsure of the day of the week.  He is slow to respond and is a poor historian.  He does continue to smoke.  He denies having any fever.  He denies any burning with urination and denies any cough or shortness of breath.  He denies any loss of consciousness.  ED Course: In the emergency room patient is found to have bilateral lower extremity erythema and swelling.  When compared to last week when he was  admitted from the pictures in the chart his erythema and swelling is worse.  He again has hyponatremia with a sodium of 122.  Hospital service been asked to admit for further management  Review of Systems:  General: Reports generalized weakness and falls at home.  Denies fever, chills, weight loss, night sweats.  Denies dizziness.  Denies change in appetite HENT: Denies head trauma, headache, denies change in hearing, tinnitus.  Denies nasal congestion or bleeding.  Denies sore throat, sores in mouth.  Denies difficulty swallowing Eyes: Denies blurry vision, pain in eye, drainage.  Denies discoloration of eyes. Neck: Denies pain.  Denies swelling.  Denies pain with movement. Cardiovascular: Denies chest pain, palpitations.  Denies edema.  Denies orthopnea Respiratory: Denies shortness of breath, cough.  Denies wheezing.  Denies sputum production Gastrointestinal: Denies abdominal pain, swelling.  Denies nausea, vomiting, diarrhea.  Denies melena.  Denies hematemesis. Musculoskeletal: Reports right knee pain.  Reports normal range of motion of extremities when lying in bed but has difficulty ambulating.  Has increased swelling of legs.  Denies arthralgias or myalgias. Genitourinary: Denies pelvic pain.  Denies urinary frequency or hesitancy.  Denies dysuria.  Skin: Has erythema of bilateral lower extremities.  Has blister formation of left foot.  No purulent drainage.  Denies rash.  Denies petechiae, purpura, ecchymosis.  Legs  are indurated but no fluctuation Neurological: Denies headache.  Denies syncope.  Denies seizure activity.  Denies weakness or paresthesia.  Slurred speech, drooping face.  Denies visual change. Psychiatric: Denies depression, anxiety.  Denies suicidal thoughts or ideation.  Denies hallucinations.  Past Medical History:  Diagnosis Date  . Alcoholism (HCC)   . Blind left eye   . COPD (chronic obstructive pulmonary disease) (HCC)   . CVA (cerebral vascular accident) (HCC)     12 years ago- residual R sided numbness  . Hypertension     Past Surgical History:  Procedure Laterality Date  . ANTERIOR CRUCIATE LIGAMENT REPAIR      Social History  reports that he has been smoking. He has been smoking about 1.00 pack per day. He has never used smokeless tobacco. He reports current alcohol use. He reports previous drug use.  No Known Allergies  History reviewed. No pertinent family history.   Prior to Admission medications   Medication Sig Start Date End Date Taking? Authorizing Provider  feeding supplement, ENSURE ENLIVE, (ENSURE ENLIVE) LIQD Take 237 mLs by mouth 3 (three) times daily between meals. 12/12/19   Rolly Salter, MD  folic acid (FOLVITE) 1 MG tablet Take 1 tablet (1 mg total) by mouth daily. 12/13/19   Rolly Salter, MD  gabapentin (NEURONTIN) 100 MG capsule Take 1 capsule (100 mg total) by mouth 2 (two) times daily. 12/12/19   Rolly Salter, MD  lactulose (CHRONULAC) 10 GM/15ML solution Take 30 mLs (20 g total) by mouth daily. 12/13/19   Rolly Salter, MD  Multiple Vitamin (MULTIVITAMIN WITH MINERALS) TABS tablet Take 1 tablet by mouth daily. 12/13/19   Rolly Salter, MD  naproxen sodium (ALEVE) 220 MG tablet Take 220 mg by mouth 2 (two) times daily as needed (knee pain).    [provider]  nicotine (NICODERM CQ - DOSED IN MG/24 HOURS) 14 mg/24hr patch Place 1 patch (14 mg total) onto the skin daily. 12/13/19   Rolly Salter, MD  nutrition supplement, Heinz Knuckles, Heinz Knuckles) PACK Take 1 packet by mouth 2 (two) times daily between meals. 12/12/19   Rolly Salter, MD  thiamine 100 MG tablet Take 1 tablet (100 mg total) by mouth daily. 12/13/19   Rolly Salter, MD    Physical Exam: Vitals:   12/15/19 1742 12/15/19 1951 12/16/19 0506  BP: (!) 136/93 (!) 130/98 (!) 137/95  Pulse: (!) 105 (!) 109 (!) 111  Resp: Temp: 98.3 F (36.8 C)  98.2 F (36.8 C)  TempSrc: Oral  Oral  SpO2: 100% 100% 99%    Constitutional: NAD, calm,  comfortable Vitals:   12/15/19 1742 12/15/19 1951 12/16/19 0506  BP: (!) 136/93 (!) 130/98 (!) 137/95  Pulse: (!) 105 (!) 109 (!) 111  Resp: Temp: 98.3 F (36.8 C)  98.2 F (36.8 C)  TempSrc: Oral  Oral  SpO2: 100% 100% 99%   General: WDWN, Alert and oriented x3.  Eyes: EOMI right eye, right pupil reactive to light.  Blind in left eye.  lids and conjunctivae normal.  Sclera nonicteric HENT:  [address] and C/AT, external ears normal.  Nares patent without epistasis.  Mucous membranes are moist. Posterior pharynx clear of any exudate or lesions. Normal dentition.  Neck: Soft, normal range of motion, supple, no masses, no thyromegaly.  Trachea midline Respiratory: clear to auscultation bilaterally, no wheezing, no crackles. Normal respiratory effort. No accessory muscle use.  Cardiovascular: Sinus tachycardia with a  rate of 100-110. no murmurs / rubs / gallops.  Trace pedal pulses.  Abdomen: Soft, no tenderness, nondistended, no rebound or guarding.  No masses palpated. No hepatosplenomegaly. Bowel sounds   Musculoskeletal: Passive normal ROM upper extremities.  Normal passive range of motion of lower extremities.  3+ edema of lower extremities bilaterally.  Has clubbing of digits.  No cyanosis. No joint deformity upper and lower extremities. Normal muscle tone.  Skin: Warm, dry, intact.  Indurated erythema of bilateral lower extremities.  Large clear vesicle on left foot at base of second and third toe with no drainage.  No ulcers.  Erythema blanches to touch.  No fluctuation. Neurologic: CN 2-12 grossly intact.  Normal speech.  Sensation intact, patella DTR +1 bilaterally. Strength 3/5 in all extremities Psychiatric: Depressed mood with flat affect.  No hallucinations or delusions.  Denies suicidal ideation   Labs on Admission: I have personally reviewed following labs and imaging studies  CBC: Recent Labs  Lab 12/10/19 0421 12/11/19 0318 12/12/19 0429 12/16/19 0151  WBC 7.4 7.4 7.1 7.1   NEUTROABS  --   --   --  4.8  HGB 13.5 13.0 12.5* 12.5*  HCT 40.5 39.2 38.1* 36.9*  MCV 105.5* 106.8* 106.7* 104.2*  PLT 405* 430* 423* 300    Basic Metabolic Panel: Recent Labs  Lab 12/09/19 1637 12/10/19 0421 12/11/19 0318 12/12/19 0429 12/16/19 0151  NA 132* 129* 132* 131* 122*  K 4.6 4.1 4.0 3.9 5.8*  CL 96* 96* 99 100 94*  CO2 32 26 25 25  19*  GLUCOSE 110* 102* 106* 107* 105*  BUN 18 14 14 14  24*  CREATININE 0.95 0.68 0.68 0.60* 0.80  CALCIUM 8.5* 8.5* 8.7* 8.5* 8.6*    GFR: Estimated Creatinine Clearance: 122.7 mL/min (by C-G formula based on SCr of 0.8 mg/dL).  Liver Function Tests: No results for input(s): AST, ALT, ALKPHOS, BILITOT, PROT, ALBUMIN in the last 168 hours.  Urine analysis:    Component Value Date/Time   COLORURINE AMBER (A) 12/16/2019 0213   APPEARANCEUR HAZY (A) 12/16/2019 0213   LABSPEC 1.019 12/16/2019 0213   PHURINE 5.0 12/16/2019 0213   GLUCOSEU NEGATIVE 12/16/2019 0213   HGBUR NEGATIVE 12/16/2019 0213   BILIRUBINUR NEGATIVE 12/16/2019 0213   KETONESUR NEGATIVE 12/16/2019 0213   PROTEINUR NEGATIVE 12/16/2019 0213   NITRITE NEGATIVE 12/16/2019 0213   LEUKOCYTESUR NEGATIVE 12/16/2019 0213    Radiological Exams on Admission: DG Chest Port 1 View  Result Date: 12/16/2019 CLINICAL DATA:  Leg swelling EXAM: PORTABLE CHEST 1 VIEW COMPARISON:  12/04/2019 FINDINGS: Cardiac shadow is at the upper limits of normal in size is stable. Aortic calcifications are noted. Lungs are well aerated bilaterally. No focal infiltrate or sizable effusion is seen. Old rib fractures with pleural thickening are noted on the left stable from the prior exam. IMPRESSION: No acute abnormality noted. Electronically Signed   By: Alcide CleverMark  Lukens M.D.   On: 12/16/2019 01:43   DG Knee Complete 4 Views Right  Result Date: 12/15/2019 CLINICAL DATA:  Fall with knee pain EXAM: RIGHT KNEE - COMPLETE 4+ VIEW COMPARISON:  None. FINDINGS: No acute displaced fracture or malalignment.  Moderate patellofemoral and lateral joint space degenerative change with advanced disease at the medial joint space. No significant knee effusion. IMPRESSION: No acute osseous abnormality. Electronically Signed   By: Jasmine PangKim  Fujinaga M.D.   On: 12/15/2019 19:28    Assessment/Plan Principal Problem:   Bilateral cellulitis of lower leg Mr. Cheryl FlashReddish is admitted to medical floor.  He is started on vancomycin and Zosyn empirically for worsening cellulitis of bilateral lower extremities. Will monitor for nephrotoxic effects of antibiotic coverage and adjust antibiotics if cause decreasing kidney function    Hyponatremia Secondary to alcoholism.  Patient placed on IV fluid with normal saline at 75 mils per hour. Monitor electrolytes and renal function.    COPD (chronic obstructive pulmonary disease) (HCC) RT to follow.  Incentive spirometer every 2 hours while awake.  DuoNebs as needed for shortness of breath, cough, will wheezing.    Generalized weakness Consult PT.  Will other staff ambulate with assistance.  Fall precautions placed.    Alcoholism (HCC) Consult social work.  Patient placed on CIWA protocol.  Provide Ativan as needed for elevated CIWA scores.  Patient reports he has not had any alcohol in the last week. Continue multivitamin, folic acid and thiamine daily.      DVT prophylaxis: Lovenox for DVT prophylaxis Code Status:   Full code Family Communication:  Diagnosis plan discussed with patient.  Patient verbalized understanding agrees with plan.  Further recommendation to follow as clinical indicated  Disposition Plan:   Patient is from:  Home  Anticipated DC to:  Patient may need rehab at SNF at discharge.  Awaiting PT evaluation and social work evaluation  Anticipated DC date:  Anticipate greater than 2 midnight stay  Anticipated DC barriers:         Barriers include poor living conditions with little social help for patient.  Patient has had multiple admissions for current       condition  Admission status:  Inpatient  Severity of Illness: The appropriate patient status for this patient is INPATIENT. Inpatient status is judged to be reasonable and necessary in order to provide the required intensity of service to ensure the patient's safety. The patient's presenting symptoms, physical exam findings, and initial radiographic and laboratory data in the context of their chronic comorbidities is felt to place them at high risk for further clinical deterioration. Furthermore, it is not anticipated that the patient will be medically stable for discharge from the hospital within 2 midnights of admission. The following factors support the patient status of inpatient.   " The patient's presenting symptoms include generalized weakness and inability to ambulate and do ADLs. " The worrisome physical exam findings include swelling of legs with weakness of legs. " The initial radiographic and laboratory data are worrisome because of hyponatremia with sodium of 122. " The chronic co-morbidities include COPD, alcoholism, chronic hyponatremia, poor social living conditions   * I certify that at the point of admission it is my clinical judgment that the patient will require inpatient hospital care spanning beyond 2 midnights from the point of admission due to high intensity of service, high risk for further deterioration and high frequency of surveillance required.Claudean Severance Cynithia Hakimi MD [facility]  How to contact the [facility] Attending or Consulting provider 7A - 7P or covering provider during after hours 7P -7A, for this patient?   1. Check the care team in [facility] and look for a) attending/consulting [facility] provider listed and b) the [facility] team listed 2. Log into [email or web address] and use [place]'s universal password to access. If you do not have the password, please contact the hospital operator. 3. Locate the [facility] provider you are looking for under [facility] and page  to a number that you can be directly reached. 4. If you still have difficulty reaching the provider, please page the  DOC (Director on Call) for the Hospitalists listed on amion for assistance.  12/16/2019, 6:24 AM

## 2019-12-16 NOTE — Plan of Care (Signed)

## 2019-12-17 ENCOUNTER — Inpatient Hospital Stay (HOSPITAL_COMMUNITY): Payer: Self-pay

## 2019-12-17 DIAGNOSIS — I10 Essential (primary) hypertension: Secondary | ICD-10-CM | POA: Diagnosis present

## 2019-12-17 DIAGNOSIS — R600 Localized edema: Secondary | ICD-10-CM

## 2019-12-17 DIAGNOSIS — R531 Weakness: Secondary | ICD-10-CM

## 2019-12-17 DIAGNOSIS — F1020 Alcohol dependence, uncomplicated: Secondary | ICD-10-CM

## 2019-12-17 DIAGNOSIS — E871 Hypo-osmolality and hyponatremia: Secondary | ICD-10-CM

## 2019-12-17 DIAGNOSIS — J431 Panlobular emphysema: Secondary | ICD-10-CM

## 2019-12-17 LAB — CBC WITH DIFFERENTIAL/PLATELET
Abs Immature Granulocytes: 0.02 10*3/uL (ref 0.00–0.07)
Basophils Absolute: 0.1 10*3/uL (ref 0.0–0.1)
Basophils Relative: 2 %
Eosinophils Absolute: 0.2 10*3/uL (ref 0.0–0.5)
Eosinophils Relative: 5 %
HCT: 34.6 % — ABNORMAL LOW (ref 39.0–52.0)
Hemoglobin: 11.3 g/dL — ABNORMAL LOW (ref 13.0–17.0)
Immature Granulocytes: 0 %
Lymphocytes Relative: 23 %
Lymphs Abs: 1.2 10*3/uL (ref 0.7–4.0)
MCH: 34.2 pg — ABNORMAL HIGH (ref 26.0–34.0)
MCHC: 32.7 g/dL (ref 30.0–36.0)
MCV: 104.8 fL — ABNORMAL HIGH (ref 80.0–100.0)
Monocytes Absolute: 1.1 10*3/uL — ABNORMAL HIGH (ref 0.1–1.0)
Monocytes Relative: 23 %
Neutro Abs: 2.4 10*3/uL (ref 1.7–7.7)
Neutrophils Relative %: 47 %
Platelets: 300 10*3/uL (ref 150–400)
RBC: 3.3 MIL/uL — ABNORMAL LOW (ref 4.22–5.81)
RDW: 12.2 % (ref 11.5–15.5)
WBC: 5.1 10*3/uL (ref 4.0–10.5)
nRBC: 0 % (ref 0.0–0.2)

## 2019-12-17 LAB — BASIC METABOLIC PANEL
Anion gap: 6 (ref 5–15)
BUN: 12 mg/dL (ref 6–20)
CO2: 24 mmol/L (ref 22–32)
Calcium: 8.3 mg/dL — ABNORMAL LOW (ref 8.9–10.3)
Chloride: 100 mmol/L (ref 98–111)
Creatinine, Ser: 0.64 mg/dL (ref 0.61–1.24)
GFR calc Af Amer: >60 mL/min (ref >60)
GFR calc non Af Amer: >60 mL/min (ref >60)
Glucose, Bld: 110 mg/dL — ABNORMAL HIGH (ref 70–99)
Potassium: 3.8 mmol/L (ref 3.5–5.1)
Sodium: 130 mmol/L — ABNORMAL LOW (ref 135–145)

## 2019-12-17 LAB — ECHOCARDIOGRAM COMPLETE
Height: 67 in
Weight: 3580.27 oz

## 2019-12-17 LAB — RAPID URINE DRUG SCREEN, HOSP PERFORMED
Amphetamines: NOT DETECTED
Barbiturates: NOT DETECTED
Benzodiazepines: NOT DETECTED
Cocaine: NOT DETECTED
Opiates: NOT DETECTED
Tetrahydrocannabinol: NOT DETECTED

## 2019-12-17 LAB — PHOSPHORUS: Phosphorus: 4.3 mg/dL (ref 2.5–4.6)

## 2019-12-17 LAB — MAGNESIUM: Magnesium: 1.8 mg/dL (ref 1.7–2.4)

## 2019-12-17 MED ORDER — LISINOPRIL 5 MG PO TABS
2.5000 mg | ORAL_TABLET | Freq: Every day | ORAL | Status: DC
  Administered 2019-12-17 – 2019-12-18 (×2): 2.5 mg via ORAL
  Filled 2019-12-17 (×2): qty 1

## 2019-12-17 MED ORDER — ZINC OXIDE 40 % EX OINT
TOPICAL_OINTMENT | CUTANEOUS | Status: DC
  Filled 2019-12-17: qty 57

## 2019-12-17 MED ORDER — IPRATROPIUM-ALBUTEROL 0.5-2.5 (3) MG/3ML IN SOLN: 3.0000 mL | RESPIRATORY_TRACT | Status: DC | PRN

## 2019-12-17 MED ORDER — METOPROLOL TARTRATE 25 MG PO TABS
12.5000 mg | ORAL_TABLET | Freq: Two times a day (BID) | ORAL | Status: DC
  Administered 2019-12-17 – 2019-12-18 (×3): 12.5 mg via ORAL
  Filled 2019-12-17 (×3): qty 1

## 2019-12-17 MED ORDER — ENOXAPARIN SODIUM 60 MG/0.6ML ~~LOC~~ SOLN
50.0000 mg | SUBCUTANEOUS | Status: DC
  Administered 2019-12-18 – 2019-12-20 (×3): 50 mg via SUBCUTANEOUS
  Filled 2019-12-17 (×4): qty 0.5

## 2019-12-17 NOTE — Consult Note (Signed)
WOC Nurse Consult Note: Discussed with Dr. Joseph Art via Secure Chat and it is decided that exfoliation and moisturizing while important, are not the same priority that compression and resolution of infection is today. We are in agreement that post-discharge from acute care, patient will benefit from follow up from an outpatient wound care center of her choosing. Physician arrangement of that with MSW/Case Management will be required.  WOC nursing team will not follow, but will remain available to this patient, the nursing and medical teams.  Please re-consult if needed. Thanks, Ladona Mow, MSN, RN, GNP, [facility]  Pager# [phone]

## 2019-12-17 NOTE — Plan of Care (Signed)
  Problem: Nutrition: Goal: Adequate nutrition will be maintained Outcome: Progressing   Problem: Coping: Goal: Level of anxiety will decrease Outcome: Progressing   Problem: Elimination: Goal: Will not experience complications related to bowel motility Outcome: Progressing   Problem: Pain Managment: Goal: General experience of comfort will improve Outcome: Progressing   

## 2019-12-17 NOTE — Consult Note (Signed)
WOC Nurse Consult Note: Reason for Consult: Bilateral LEs with edema, erythema, intact and ruptured blisters on left LE at foot.  Photos taken in the ED yesterday are appreciated. Patient with poor hygiene. Patient was seen by my partner, M. Austin on 12/05/19. Wound type: Infectious, venous insufficiency Pressure Injury POA: Yes/No/NA Stage 2 pressure injury to sacral area Stage 2 pressure injury to right buttock Full thickness wound to left lateral malleolus Non blanching purple skin discoloration to right medial thigh, ecchymosis vs DTPI Right knee traumatic injury, with dried serum (scab) Dressing procedure/placement/frequency: I will provide an mattress replacement with low air loss feature for moisture management and continue Ms. Austin's POC for zinc oxide to the buttocks to prevent from further incontinence. Silicone foams to the buttocks and sacrum. Xeroform gauze to the blisters and open areas on the LEs changed twice daily.  WOC nursing team will not follow, but will remain available to this patient, the nursing and medical teams.  Please re-consult if needed. Thanks, Ladona Mow, MSN, RN, GNP, [facility]  Pager# [phone]

## 2019-12-17 NOTE — Progress Notes (Signed)
Attempted to do wound care to patient's bilateral legs. Patient stated he did not want any ointments on his legs at this time. Will pass along to nightshift to continue to assess legs and need for wound care.

## 2019-12-17 NOTE — Progress Notes (Signed)
*  PRELIMINARY RESULTS* Echocardiogram 2D Echocardiogram has been performed.  [name] 12/17/2019, 5:07 PM

## 2019-12-17 NOTE — Progress Notes (Addendum)
PROGRESS NOTE    Robert Frank  CBS:[id number] DOB: July 08, 1965 DOA: 12/15/2019 PCP: Patient, No Pcp Per     Brief Narrative:   54 y.o. WM PMHx  CVA and EtOH abuse   Presents to the emergency department for evaluation of right knee injury after a fall. Patient reports that he is having trouble walking, feels weak all over.   He has had several falls at home over the last few days.  He initially presented for evaluation of right knee pain after a fall yesterday afternoon.  He was admitted for similar complaints last week.  Patient has history of alcoholism.  He reportedly lives in a home that is extremely dirty and run down and home health will not go see it due to safety reasons reportedly.  He states he lives with his brother.  Reports he has been feeling very weak and after his fall earlier today he could not get up by himself and his brother helped him get up in the bed.  He has not been able to get up and ambulate or do any activities around the house.  He does have chronic back pain which still bothers him but is not worse.  He reports he has not drink any alcohol in a week.  He had hyponatremia last week when he was admitted.  Hyponatremia is most likely secondary to his alcoholism.  Reviewing his chart he has chronic hyponatremia.  He states he has not drink after his last discharge from the hospital.  He is oriented to person place and the month but he is unsure of the day of the week.  He is slow to respond and is a poor historian.  He does continue to smoke.  He denies having any fever.  He denies any burning with urination and denies any cough or shortness of breath.  He denies any loss of consciousness.  ED Course: In the emergency room patient is found to have bilateral lower extremity erythema and swelling.  When compared to last week when he was admitted from the pictures in the chart his erythema and swelling is worse.  He again has hyponatremia with a sodium of 122.  Hospital  service been asked to admit for further management   Subjective: 7/10 overnight patient caught smoking in his room.  Afebrile overnight.  A/O x4, although patient has gaps in his memory (could not remember whether he was vaccinated for Covid).   Assessment & Plan: Covid vaccination; no vaccination   Principal Problem:   Bilateral cellulitis of lower leg Active Problems:   COPD (chronic obstructive pulmonary disease) (HCC)   Alcoholism (HCC)   Generalized weakness   Hyponatremia   Bilateral lower leg cellulitis  Bilateral lower extremity cellulitis -Continue current antibiotics for minimum 7 days -Monitor for nephrotoxic effects of antibiotics and make adjustments. -7/10 W OC consult best way to defoliate bilateral lower extremities and moisturize  Hyponatremia -Most likely secondary to alcohol abuse -Normal saline 57ml/hr Recent Labs  Lab 12/12/19 0429 12/16/19 0151 12/16/19 1035 12/16/19 1600 12/17/19 0506  NA 131* 122* 128* 127* 130*  -Improving  CHF? -Patient has all hallmarks of CHF fluid retention bilateral lower extremities to the hip -7/10 echocardiogram pending  Essential HTN -7/10 lisinopril 2.5 mg daily -7/10 Metoprolol 12.5 mg BID  COPD -DuoNeb q 4 hr PRN -Incentive spirometry  Generalized weakness -Secondary to alcoholism? -We will consult PT/OT  EtOH abuse -CIWA protocol  Anemia unspecified -Most likely secondary to alcoholism -Anemia panel  pending   DVT prophylaxis: Lovenox Code Status: Full Family Communication:  Status is: Inpatient    Dispo: The patient is from: Home              Anticipated d/c is to: Home              Anticipated d/c date is: 7/14              Patient currently unstable      Consultants:    Procedures/Significant Events:    I have personally reviewed and interpreted all radiology studies and my findings are as above.  VENTILATOR SETTINGS:    Cultures   Antimicrobials: Anti-infectives  (From admission, onward)   Start     Ordered Stop   12/16/19 1700  vancomycin (VANCOCIN) IVPB 1000 mg/200 mL premix     Discontinue     12/16/19 0628     12/16/19 1400  piperacillin-tazobactam (ZOSYN) IVPB 3.375 g     Discontinue     12/16/19 0628     12/16/19 0630  piperacillin-tazobactam (ZOSYN) IVPB 3.375 g        12/16/19 0628 12/16/19 0715   12/16/19 0345  vancomycin (VANCOREADY) IVPB 2000 mg/400 mL        12/16/19 0342 12/16/19 0642       Devices    LINES / TUBES:      Continuous Infusions: . sodium chloride 75 mL/hr at 12/16/19 1207  . piperacillin-tazobactam (ZOSYN)  IV 3.375 g (12/17/19 0610)  . vancomycin 1,000 mg (12/17/19 0445)     Objective: Vitals:   12/16/19 1352 12/16/19 1600 12/16/19 1952 12/17/19 0517  BP: (!) 121/91 (!) 121/91 (!) 136/92 (!) 142/100  Pulse: 99 98 (!) 101 98  Resp: 18  20 18   Temp: 98.4 F (36.9 C)  98.9 F (37.2 C) 98.2 F (36.8 C)  TempSrc: Oral  Oral Oral  SpO2: 100%  100% 98%  Weight:      Height:        Intake/Output Summary (Last 24 hours) at 12/17/2019 1013 Last data filed at 12/17/2019 0736 Gross per 24 hour  Intake --  Output 1800 ml  Net -1800 ml   Filed Weights   12/16/19 1154  Weight: 101.5 kg   Physical Exam:  General: A/O x4, No acute respiratory distress Eyes: negative scleral hemorrhage, negative anisocoria, negative icterus ENT: Negative Runny nose, negative gingival bleeding, Neck:  Negative scars, masses, torticollis, lymphadenopathy, JVD Lungs: Clear to auscultation bilaterally without wheezes or crackles Cardiovascular: Regular rate and rhythm without murmur gallop or rub normal S1 and S2 Abdomen: negative abdominal pain, nondistended, positive soft, bowel sounds, no rebound, no ascites, no appreciable mass Extremities: bilateral lower extremity edema to hip.  Significant scaly breakdown of skin bilateral calf area (painful to palpation) Psychiatric:  Negative depression, negative anxiety, negative  fatigue, negative mania  Central nervous system:  Cranial nerves II through XII intact, tongue/uvula midline, all extremities muscle strength 5/5, sensation intact throughout, negative dysarthria, negative expressive aphasia, negative receptive aphasia..     Data Reviewed: Care during the described time interval was provided by me .  I have reviewed this patient's available data, including medical history, events of note, physical examination, and all test results as part of my evaluation.  CBC: Recent Labs  Lab 12/11/19 0318 12/12/19 0429 12/16/19 0151 12/16/19 1035 12/16/19 2031  WBC 7.4 7.1 7.1 5.3 5.8  NEUTROABS  --   --  4.8  --  3.9  HGB  13.0 12.5* 12.5* 12.4* 11.6*  HCT 39.2 38.1* 36.9* 36.7* 34.4*  MCV 106.8* 106.7* 104.2* 103.7* 103.3*  PLT 430* 423* 300 329 326   Basic Metabolic Panel: Recent Labs  Lab 12/12/19 0429 12/16/19 0151 12/16/19 1035 12/16/19 1600 12/17/19 0506  NA 131* 122* 128* 127* 130*  K 3.9 5.8* 4.3 4.2 3.8  CL 100 94* 98 97* 100  CO2 25 19* 23 21* 24  GLUCOSE 107* 105* 129* 119* 110*  BUN 14 24* 15 14 12   CREATININE 0.60* 0.80 0.67 0.76 0.64  CALCIUM 8.5* 8.6* 8.4* 8.2* 8.3*  MG  --   --   --   --  1.8  PHOS  --   --   --   --  4.3   GFR: Estimated Creatinine Clearance: 122.7 mL/min (by C-G formula based on SCr of 0.64 mg/dL). Liver Function Tests: No results for input(s): AST, ALT, ALKPHOS, BILITOT, PROT, ALBUMIN in the last 168 hours. No results for input(s): LIPASE, AMYLASE in the last 168 hours. No results for input(s): AMMONIA in the last 168 hours. Coagulation Profile: No results for input(s): INR, PROTIME in the last 168 hours. Cardiac Enzymes: No results for input(s): CKTOTAL, CKMB, CKMBINDEX, TROPONINI in the last 168 hours. BNP (last 3 results) No results for input(s): PROBNP in the last 8760 hours. HbA1C: No results for input(s): HGBA1C in the last 72 hours. CBG: No results for input(s): GLUCAP in the last 168 hours. Lipid  Profile: No results for input(s): CHOL, HDL, LDLCALC, TRIG, CHOLHDL, LDLDIRECT in the last 72 hours. Thyroid Function Tests: No results for input(s): TSH, T4TOTAL, FREET4, T3FREE, THYROIDAB in the last 72 hours. Anemia Panel: No results for input(s): VITAMINB12, FOLATE, FERRITIN, TIBC, IRON, RETICCTPCT in the last 72 hours. Sepsis Labs: Recent Labs  Lab 12/16/19 0151  LATICACIDVEN 1.7    Recent Results (from the past 240 hour(s))  Culture, blood (Routine X 2) w Reflex to ID Panel     Status: None (Preliminary result)   Collection Time: 12/16/19  1:43 AM   Specimen: BLOOD  Result Value Ref Range Status   Specimen Description BLOOD LEFT ANTECUBITAL  Final   Special Requests   Final    BOTTLES DRAWN AEROBIC AND ANAEROBIC Blood Culture results may not be optimal due to an inadequate volume of blood received in culture bottles   Culture   Final    NO GROWTH < 12 HOURS Performed at [facility], [address], [place], [place]    Report Status PENDING  Incomplete  Culture, blood (Routine X 2) w Reflex to ID Panel     Status: None (Preliminary result)   Collection Time: 12/16/19  2:39 AM   Specimen: BLOOD  Result Value Ref Range Status   Specimen Description BLOOD SITE NOT SPECIFIED  Final   Special Requests   Final    BOTTLES DRAWN AEROBIC AND ANAEROBIC Blood Culture results may not be optimal due to an inadequate volume of blood received in culture bottles   Culture   Final    NO GROWTH < 12 HOURS Performed at [facility], [address], [place], [place]    Report Status PENDING  Incomplete  SARS Coronavirus 2 by RT PCR (hospital order, performed in [facility] hospital lab) Nasopharyngeal Nasopharyngeal Swab     Status: None   Collection Time: 12/16/19  3:48 AM   Specimen: Nasopharyngeal Swab  Result Value Ref Range Status   SARS Coronavirus  2 NEGATIVE NEGATIVE Final    Comment: (NOTE) SARS-CoV-2 target nucleic acids are NOT DETECTED.  The  SARS-CoV-2 RNA is generally detectable in upper and lower respiratory specimens during the acute phase of infection. The lowest concentration of SARS-CoV-2 viral copies this assay can detect is 250 copies / mL. A negative result does not preclude SARS-CoV-2 infection and should not be used as the sole basis for treatment or other patient management decisions.  A negative result may occur with improper specimen collection / handling, submission of specimen other than nasopharyngeal swab, presence of viral mutation(s) within the areas targeted by this assay, and inadequate number of viral copies (<250 copies / mL). A negative result must be combined with clinical observations, patient history, and epidemiological information.  Fact Sheet for Patients:   BoilerBrush.com.cyhttps://www.fda.gov/media/136312/download  Fact Sheet for Healthcare Providers: https://pope.com/https://www.fda.gov/media/136313/download  This test is not yet approved or  cleared by the [place] FDA and has been authorized for detection and/or diagnosis of SARS-CoV-2 by FDA under an Emergency Use Authorization (EUA).  This EUA will remain in effect (meaning this test can be used) for the duration of the COVID-19 declaration under Section 564(b)(1) of the Act, 21 U.S.C. section 360bbb-3(b)(1), unless the authorization is terminated or revoked sooner.  Performed at [facility], [address] [place], [place] [address]          Radiology Studies: DG Chest Port 1 View  Result Date: 12/16/2019 CLINICAL DATA:  Leg swelling EXAM: PORTABLE CHEST 1 VIEW COMPARISON:  12/04/2019 FINDINGS: Cardiac shadow is at the upper limits of normal in size is stable. Aortic calcifications are noted. Lungs are well aerated bilaterally. No focal infiltrate or sizable effusion is seen. Old rib fractures with pleural thickening are noted on the left stable from the prior exam. IMPRESSION: No acute abnormality noted. Electronically Signed   By: Alcide CleverMark  Lukens  M.D.   On: 12/16/2019 01:43   DG Knee Complete 4 Views Right  Result Date: 12/15/2019 CLINICAL DATA:  Fall with knee pain EXAM: RIGHT KNEE - COMPLETE 4+ VIEW COMPARISON:  None. FINDINGS: No acute displaced fracture or malalignment. Moderate patellofemoral and lateral joint space degenerative change with advanced disease at the medial joint space. No significant knee effusion. IMPRESSION: No acute osseous abnormality. Electronically Signed   By: Jasmine PangKim  Fujinaga M.D.   On: 12/15/2019 19:28        Scheduled Meds: . aspirin EC  81 mg Oral Daily  . enoxaparin (LOVENOX) injection  40 mg Subcutaneous Q24H  . folic acid  1 mg Oral Daily  . gabapentin  100 mg Oral BID  . LORazepam  0-4 mg Intravenous Q6H   Or  . LORazepam  0-4 mg Oral Q6H  . [START ON 12/18/2019] LORazepam  0-4 mg Intravenous Q12H   Or  . [START ON 12/18/2019] LORazepam  0-4 mg Oral Q12H  . multivitamin with minerals  1 tablet Oral Daily  . thiamine  100 mg Oral Daily   Or  . thiamine  100 mg Intravenous Daily   Continuous Infusions: . sodium chloride 75 mL/hr at 12/16/19 1207  . piperacillin-tazobactam (ZOSYN)  IV 3.375 g (12/17/19 0610)  . vancomycin 1,000 mg (12/17/19 0445)     LOS: 1 day    Time spent:40 min    Delmont Prosch, Roselind MessierURTIS J, MD [facility] Pager [phone]  If 7PM-7AM, please contact night-coverage [email or web address] Password [email or web address] 12/17/2019, 10:13 AM

## 2019-12-17 NOTE — Progress Notes (Signed)
Pt smoking in his room. RN Leotis Shames confiscated lighter. Pt denies having any more cigarette. Educated pt on hospital policy and safety due to smoking in the hospital

## 2019-12-18 DIAGNOSIS — D638 Anemia in other chronic diseases classified elsewhere: Secondary | ICD-10-CM | POA: Diagnosis present

## 2019-12-18 DIAGNOSIS — I10 Essential (primary) hypertension: Secondary | ICD-10-CM

## 2019-12-18 LAB — COMPREHENSIVE METABOLIC PANEL
ALT: 41 U/L (ref 0–44)
AST: 45 U/L — ABNORMAL HIGH (ref 15–41)
Albumin: 1.9 g/dL — ABNORMAL LOW (ref 3.5–5.0)
Alkaline Phosphatase: 37 U/L — ABNORMAL LOW (ref 38–126)
Anion gap: 8 (ref 5–15)
BUN: 12 mg/dL (ref 6–20)
CO2: 23 mmol/L (ref 22–32)
Calcium: 8.4 mg/dL — ABNORMAL LOW (ref 8.9–10.3)
Chloride: 100 mmol/L (ref 98–111)
Creatinine, Ser: 0.85 mg/dL (ref 0.61–1.24)
GFR calc Af Amer: >60 mL/min (ref >60)
GFR calc non Af Amer: >60 mL/min (ref >60)
Glucose, Bld: 102 mg/dL — ABNORMAL HIGH (ref 70–99)
Potassium: 3.8 mmol/L (ref 3.5–5.1)
Sodium: 131 mmol/L — ABNORMAL LOW (ref 135–145)
Total Bilirubin: 0.6 mg/dL (ref 0.3–1.2)
Total Protein: 7.4 g/dL (ref 6.5–8.1)

## 2019-12-18 LAB — CBC WITH DIFFERENTIAL/PLATELET
Abs Immature Granulocytes: 0.02 10*3/uL (ref 0.00–0.07)
Basophils Absolute: 0.1 10*3/uL (ref 0.0–0.1)
Basophils Relative: 1 %
Eosinophils Absolute: 0.3 10*3/uL (ref 0.0–0.5)
Eosinophils Relative: 5 %
HCT: 33.8 % — ABNORMAL LOW (ref 39.0–52.0)
Hemoglobin: 11.3 g/dL — ABNORMAL LOW (ref 13.0–17.0)
Immature Granulocytes: 0 %
Lymphocytes Relative: 26 %
Lymphs Abs: 1.6 10*3/uL (ref 0.7–4.0)
MCH: 34.7 pg — ABNORMAL HIGH (ref 26.0–34.0)
MCHC: 33.4 g/dL (ref 30.0–36.0)
MCV: 103.7 fL — ABNORMAL HIGH (ref 80.0–100.0)
Monocytes Absolute: 1 10*3/uL (ref 0.1–1.0)
Monocytes Relative: 16 %
Neutro Abs: 3.2 10*3/uL (ref 1.7–7.7)
Neutrophils Relative %: 52 %
Platelets: 300 10*3/uL (ref 150–400)
RBC: 3.26 MIL/uL — ABNORMAL LOW (ref 4.22–5.81)
RDW: 12.1 % (ref 11.5–15.5)
WBC: 6.2 10*3/uL (ref 4.0–10.5)
nRBC: 0 % (ref 0.0–0.2)

## 2019-12-18 LAB — RETICULOCYTES
Immature Retic Fract: 11 % (ref 2.3–15.9)
RBC.: 3.27 MIL/uL — ABNORMAL LOW (ref 4.22–5.81)
Retic Count, Absolute: 70.3 10*3/uL (ref 19.0–186.0)
Retic Ct Pct: 2.2 % (ref 0.4–3.1)

## 2019-12-18 LAB — FERRITIN: Ferritin: 754 ng/mL — ABNORMAL HIGH (ref 24–336)

## 2019-12-18 LAB — VITAMIN B12: Vitamin B-12: 301 pg/mL (ref 180–914)

## 2019-12-18 LAB — IRON AND TIBC
Iron: 45 ug/dL (ref 45–182)
Saturation Ratios: 25 % (ref 17.9–39.5)
TIBC: 182 ug/dL — ABNORMAL LOW (ref 250–450)
UIBC: 137 ug/dL

## 2019-12-18 LAB — FOLATE: Folate: 17.8 ng/mL (ref >5.9)

## 2019-12-18 LAB — MAGNESIUM: Magnesium: 1.8 mg/dL (ref 1.7–2.4)

## 2019-12-18 LAB — PHOSPHORUS: Phosphorus: 4.2 mg/dL (ref 2.5–4.6)

## 2019-12-18 MED ORDER — LISINOPRIL 5 MG PO TABS
2.5000 mg | ORAL_TABLET | Freq: Once | ORAL | Status: DC
  Filled 2019-12-18: qty 1

## 2019-12-18 MED ORDER — LISINOPRIL 5 MG PO TABS
5.0000 mg | ORAL_TABLET | Freq: Every day | ORAL | Status: DC
  Administered 2019-12-19 (×2): 5 mg via ORAL
  Filled 2019-12-18: qty 1

## 2019-12-18 NOTE — Progress Notes (Signed)
PROGRESS NOTE    Robert Frank  VPX:[id number] DOB: 04-04-1966 DOA: 12/15/2019 PCP: Patient, No Pcp Per     Brief Narrative:   54 y.o. WM PMHx  CVA and EtOH abuse   Presents to the emergency department for evaluation of right knee injury after a fall. Patient reports that he is having trouble walking, feels weak all over.   He has had several falls at home over the last few days.  He initially presented for evaluation of right knee pain after a fall yesterday afternoon.  He was admitted for similar complaints last week.  Patient has history of alcoholism.  He reportedly lives in a home that is extremely dirty and run down and home health will not go see it due to safety reasons reportedly.  He states he lives with his brother.  Reports he has been feeling very weak and after his fall earlier today he could not get up by himself and his brother helped him get up in the bed.  He has not been able to get up and ambulate or do any activities around the house.  He does have chronic back pain which still bothers him but is not worse.  He reports he has not drink any alcohol in a week.  He had hyponatremia last week when he was admitted.  Hyponatremia is most likely secondary to his alcoholism.  Reviewing his chart he has chronic hyponatremia.  He states he has not drink after his last discharge from the hospital.  He is oriented to person place and the month but he is unsure of the day of the week.  He is slow to respond and is a poor historian.  He does continue to smoke.  He denies having any fever.  He denies any burning with urination and denies any cough or shortness of breath.  He denies any loss of consciousness.  ED Course: In the emergency room patient is found to have bilateral lower extremity erythema and swelling.  When compared to last week when he was admitted from the pictures in the chart his erythema and swelling is worse.  He again has hyponatremia with a sodium of 122.  Hospital  service been asked to admit for further management   Subjective: 7/11 afebrile overnight A/O x4.  Patient very reluctant to answer questions today.  Flat affect.   Assessment & Plan: Covid vaccination; no vaccination   Principal Problem:   Bilateral cellulitis of lower leg Active Problems:   COPD (chronic obstructive pulmonary disease) (HCC)   Alcoholism (HCC)   Generalized weakness   Hyponatremia   Bilateral lower leg cellulitis   Essential hypertension   Anemia of chronic disease  Bilateral lower extremity cellulitis -Continue current antibiotics for minimum 7 days -Monitor for nephrotoxic effects of antibiotics and make adjustments. -7/10 W OC consult best way to defoliate bilateral lower extremities and moisturize  Hyponatremia -Most likely secondary to alcohol abuse -Normal saline 65ml/hr Recent Labs  Lab 12/16/19 0151 12/16/19 1035 12/16/19 1600 12/17/19 0506 12/18/19 0251  NA 122* 128* 127* 130* 131*  -Improving  CHF -Patient has all hallmarks of CHF fluid retention bilateral lower extremities to the hip -7/10 echocardiogram; normal cardiac function see results below.  CHF has been ruled out  Essential HTN -7/10 Metoprolol 12.5 mg BID -7/11 increase lisinopril 5 mg daily   COPD -DuoNeb q 4 hr PRN -Incentive spirometry  Generalized weakness -Secondary to alcoholism? -We will consult PT/OT  EtOH abuse -CIWA protocol  Anemia of chronic disease  -Most likely secondary to alcoholism -Anemia panel; most consistent with anemia of chronic disease, most likely secondary to his alcoholism   DVT prophylaxis: Lovenox Code Status: Full Family Communication:  Status is: Inpatient    Dispo: The patient is from: Home              Anticipated d/c is to: Home              Anticipated d/c date is: 7/14              Patient currently unstable      Consultants:    Procedures/Significant Events:  7/10 Echocardiogram;Left Ventricle: LVEF=60 to 65%.   -Left ventricular diastolic parameters were normal.      I have personally reviewed and interpreted all radiology studies and my findings are as above.  VENTILATOR SETTINGS:    Cultures   Antimicrobials: Anti-infectives (From admission, onward)   Start     Ordered Stop   12/16/19 1700  vancomycin (VANCOCIN) IVPB 1000 mg/200 mL premix     Discontinue     12/16/19 0628     12/16/19 1400  piperacillin-tazobactam (ZOSYN) IVPB 3.375 g     Discontinue     12/16/19 0628     12/16/19 0630  piperacillin-tazobactam (ZOSYN) IVPB 3.375 g        12/16/19 0628 12/16/19 0715   12/16/19 0345  vancomycin (VANCOREADY) IVPB 2000 mg/400 mL        12/16/19 0342 12/16/19 0642       Devices    LINES / TUBES:      Continuous Infusions: . sodium chloride Stopped (12/16/19 2124)  . piperacillin-tazobactam (ZOSYN)  IV 3.375 g (12/18/19 1417)  . vancomycin 1,000 mg (12/18/19 1707)     Objective: Vitals:   12/17/19 1715 12/17/19 1925 12/18/19 0428 12/18/19 0848  BP: (!) 150/102 (!) 171/103 (!) 164/88 (!) 135/96  Pulse: 93 95 86 91  Resp:  16 16 18   Temp: 99 F (37.2 C) 98.6 F (37 C) 98.3 F (36.8 C) 98.5 F (36.9 C)  TempSrc: Oral Oral Oral Oral  SpO2: 100% 100% 99% 97%  Weight:      Height:        Intake/Output Summary (Last 24 hours) at 12/18/2019 1815 Last data filed at 12/18/2019 1245 Gross per 24 hour  Intake 2331.78 ml  Output 1101 ml  Net 1230.78 ml   Filed Weights   12/16/19 1154  Weight: 101.5 kg    Physical Exam:  General: A/O x4, minimally cooperative (did not want to answer questions), No acute respiratory distress Eyes: negative scleral hemorrhage, negative anisocoria, negative icterus ENT: Negative Runny nose, negative gingival bleeding, Neck:  Negative scars, masses, torticollis, lymphadenopathy, JVD Lungs: Clear to auscultation bilaterally without wheezes or crackles Cardiovascular: Regular rate and rhythm without murmur gallop or rub normal S1 and  S2 Abdomen: negative abdominal pain, nondistended, positive soft, bowel sounds, no rebound, no ascites, no appreciable mass Extremities:bilateral lower extremity edema to hip.  Significant scaly breakdown of skin bilateral calf area (painful to palpation) Psychiatric:  Negative depression, negative anxiety, negative fatigue, negative mania  Central nervous system:  Cranial nerves II through XII intact, tongue/uvula midline, all extremities muscle strength 5/5, sensation intact throughout,  negative dysarthria, negative expressive aphasia, negative receptive aphasia.    Data Reviewed: Care during the described time interval was provided by me .  I have reviewed this patient's available data, including medical history, events of note,  physical examination, and all test results as part of my evaluation.  CBC: Recent Labs  Lab 12/16/19 0151 12/16/19 1035 12/16/19 2031 12/17/19 1117 12/18/19 0251  WBC 7.1 5.3 5.8 5.1 6.2  NEUTROABS 4.8  --  3.9 2.4 3.2  HGB 12.5* 12.4* 11.6* 11.3* 11.3*  HCT 36.9* 36.7* 34.4* 34.6* 33.8*  MCV 104.2* 103.7* 103.3* 104.8* 103.7*  PLT 300 329 326 300 300   Basic Metabolic Panel: Recent Labs  Lab 12/16/19 0151 12/16/19 1035 12/16/19 1600 12/17/19 0506 12/18/19 0251  NA 122* 128* 127* 130* 131*  K 5.8* 4.3 4.2 3.8 3.8  CL 94* 98 97* 100 100  CO2 19* 23 21* 24 23  GLUCOSE 105* 129* 119* 110* 102*  BUN 24* CREATININE 0.80 0.67 0.76 0.64 0.85  CALCIUM 8.6* 8.4* 8.2* 8.3* 8.4*  MG  --   --   --  1.8 1.8  PHOS  --   --   --  4.3 4.2   GFR: Estimated Creatinine Clearance: 115.5 mL/min (by C-G formula based on SCr of 0.85 mg/dL). Liver Function Tests: Recent Labs  Lab 12/18/19 0251  AST 45*  ALT 41  ALKPHOS 37*  BILITOT 0.6  PROT 7.4  ALBUMIN 1.9*   No results for input(s): LIPASE, AMYLASE in the last 168 hours. No results for input(s): AMMONIA in the last 168 hours. Coagulation Profile: No results for input(s): INR, PROTIME in  the last 168 hours. Cardiac Enzymes: No results for input(s): CKTOTAL, CKMB, CKMBINDEX, TROPONINI in the last 168 hours. BNP (last 3 results) No results for input(s): PROBNP in the last 8760 hours. HbA1C: No results for input(s): HGBA1C in the last 72 hours. CBG: No results for input(s): GLUCAP in the last 168 hours. Lipid Profile: No results for input(s): CHOL, HDL, LDLCALC, TRIG, CHOLHDL, LDLDIRECT in the last 72 hours. Thyroid Function Tests: No results for input(s): TSH, T4TOTAL, FREET4, T3FREE, THYROIDAB in the last 72 hours. Anemia Panel: Recent Labs    12/18/19 0251  VITAMINB12 301  FOLATE 17.8  FERRITIN 754*  TIBC 182*  IRON 45  RETICCTPCT 2.2   Sepsis Labs: Recent Labs  Lab 12/16/19 0151  LATICACIDVEN 1.7    Recent Results (from the past 240 hour(s))  Culture, blood (Routine X 2) w Reflex to ID Panel     Status: None (Preliminary result)   Collection Time: 12/16/19  1:43 AM   Specimen: BLOOD  Result Value Ref Range Status   Specimen Description BLOOD LEFT ANTECUBITAL  Final   Special Requests   Final    BOTTLES DRAWN AEROBIC AND ANAEROBIC Blood Culture results may not be optimal due to an inadequate volume of blood received in culture bottles   Culture   Final    NO GROWTH 2 DAYS Performed at [facility], [address], [place], [place] [address]    Report Status PENDING  Incomplete  Culture, blood (Routine X 2) w Reflex to ID Panel     Status: None (Preliminary result)   Collection Time: 12/16/19  2:39 AM   Specimen: BLOOD  Result Value Ref Range Status   Specimen Description BLOOD SITE NOT SPECIFIED  Final   Special Requests   Final    BOTTLES DRAWN AEROBIC AND ANAEROBIC Blood Culture results may not be optimal due to an inadequate volume of blood received in culture bottles   Culture   Final    NO GROWTH 2 DAYS Performed at [facility],  [address] [place], [place] [address]    Report Status PENDING  Incomplete  SARS Coronavirus 2  by RT PCR (hospital order, performed in [facility] lab) Nasopharyngeal Nasopharyngeal Swab     Status: None   Collection Time: 12/16/19  3:48 AM   Specimen: Nasopharyngeal Swab  Result Value Ref Range Status   SARS Coronavirus 2 NEGATIVE NEGATIVE Final    Comment: (NOTE) SARS-CoV-2 target nucleic acids are NOT DETECTED.  The SARS-CoV-2 RNA is generally detectable in upper and lower respiratory specimens during the acute phase of infection. The lowest concentration of SARS-CoV-2 viral copies this assay can detect is 250 copies / mL. A negative result does not preclude SARS-CoV-2 infection and should not be used as the sole basis for treatment or other patient management decisions.  A negative result may occur with improper specimen collection / handling, submission of specimen other than nasopharyngeal swab, presence of viral mutation(s) within the areas targeted by this assay, and inadequate number of viral copies (<250 copies / mL). A negative result must be combined with clinical observations, patient history, and epidemiological information.  Fact Sheet for Patients:   BoilerBrush.com.cyhttps://www.fda.gov/media/136312/download  Fact Sheet for Healthcare Providers: https://pope.com/https://www.fda.gov/media/136313/download  This test is not yet approved or  cleared by the [place] FDA and has been authorized for detection and/or diagnosis of SARS-CoV-2 by FDA under an Emergency Use Authorization (EUA).  This EUA will remain in effect (meaning this test can be used) for the duration of the COVID-19 declaration under Section 564(b)(1) of the Act, 21 U.S.C. section 360bbb-3(b)(1), unless the authorization is terminated or revoked sooner.  Performed at [facility], [address] [place], [place] [address]          Radiology Studies: ECHOCARDIOGRAM COMPLETE  Result Date: 12/17/2019    ECHOCARDIOGRAM REPORT   Patient Name:   Kelton PillarMICHAEL S Purk Date of Exam: 12/17/2019 Medical Rec #:   [id number]         Height:       67.0 in Accession #:    [id number]        Weight:       223.8 lb Date of Birth:  [birth year]         BSA:          2.121 m Patient Age:    54 years          BP:           135/76 mmHg Patient Gender: M                 HR:           93 bpm. Exam Location:  Inpatient Procedure: 2D Echo, Cardiac Doppler and Color Doppler Indications:    CHF (congestive heart failure)  History:        Patient has no prior history of Echocardiogram examinations.                 COPD and Stroke; Risk Factors:Hypertension. Alcoholism.  Sonographer:    Celesta GentileBernard White RCS Referring Phys: [id number] Viney Acocella J Ravindra Baranek IMPRESSIONS  1. Left ventricular ejection fraction, by estimation, is 60 to 65%. The left ventricle has normal function. The left ventricle has no regional wall motion abnormalities. There is mild left ventricular hypertrophy. Left ventricular diastolic parameters were normal.  2. Right ventricular systolic function is normal. The right ventricular size is normal.  3. The mitral valve is normal in structure. No evidence of mitral valve regurgitation.  No evidence of mitral stenosis.  4. The aortic valve was not well visualized. Aortic valve regurgitation is not visualized. Mild aortic valve sclerosis is present, with no evidence of aortic valve stenosis.  5. The inferior vena cava is normal in size with greater than 50% respiratory variability, suggesting right atrial pressure of 3 mmHg. FINDINGS  Left Ventricle: Left ventricular ejection fraction, by estimation, is 60 to 65%. The left ventricle has normal function. The left ventricle has no regional wall motion abnormalities. The left ventricular internal cavity size was normal in size. There is  mild left ventricular hypertrophy. Left ventricular diastolic parameters were normal. Right Ventricle: The right ventricular size is normal. No increase in right ventricular wall thickness. Right ventricular systolic function is normal. Left Atrium: Left atrial  size was normal in size. Right Atrium: Right atrial size was normal in size. Pericardium: There is no evidence of pericardial effusion. Mitral Valve: The mitral valve is normal in structure. Normal mobility of the mitral valve leaflets. No evidence of mitral valve regurgitation. No evidence of mitral valve stenosis. Tricuspid Valve: The tricuspid valve is normal in structure. Tricuspid valve regurgitation is mild . No evidence of tricuspid stenosis. Aortic Valve: The aortic valve was not well visualized. Aortic valve regurgitation is not visualized. Mild aortic valve sclerosis is present, with no evidence of aortic valve stenosis. Pulmonic Valve: The pulmonic valve was normal in structure. Pulmonic valve regurgitation is not visualized. No evidence of pulmonic stenosis. Aorta: The aortic root is normal in size and structure. Venous: The inferior vena cava is normal in size with greater than 50% respiratory variability, suggesting right atrial pressure of 3 mmHg. IAS/Shunts: No atrial level shunt detected by color flow Doppler.  LEFT VENTRICLE PLAX 2D LVIDd:         4.60 cm  Diastology LVIDs:         3.20 cm  LV e' lateral:   9.90 cm/s LV PW:         1.20 cm  LV E/e' lateral: 11.2 LV IVS:        1.40 cm  LV e' medial:    9.25 cm/s LVOT diam:     2.10 cm  LV E/e' medial:  12.0 LV SV:         81 LV SV Index:   38 LVOT Area:     3.46 cm  RIGHT VENTRICLE RV S prime:     14.90 cm/s TAPSE (M-mode): 2.3 cm LEFT ATRIUM             Index       RIGHT ATRIUM           Index LA diam:        3.50 cm 1.65 cm/m  RA Area:     18.40 cm LA Vol (A2C):   64.8 ml 30.55 ml/m RA Volume:   52.40 ml  24.70 ml/m LA Vol (A4C):   77.8 ml 36.68 ml/m LA Biplane Vol: 72.8 ml 34.32 ml/m  AORTIC VALVE LVOT Vmax:   115.00 cm/s LVOT Vmean:  78.900 cm/s LVOT VTI:    0.235 m  AORTA Ao Root diam: 3.10 cm MITRAL VALVE MV Area (PHT): 4.31 cm     SHUNTS MV Decel Time: 176 msec     Systemic VTI:  0.24 m MV E velocity: 111.00 cm/s  Systemic Diam: 2.10  cm MV A velocity: 85.30 cm/s MV E/A ratio:  1.30 Charlton Haws MD Electronically signed by Charlton Haws MD Signature Date/Time: 12/17/2019/9:35:15  PM    Final         Scheduled Meds: . aspirin EC  81 mg Oral Daily  . enoxaparin (LOVENOX) injection  50 mg Subcutaneous Q24H  . folic acid  1 mg Oral Daily  . gabapentin  100 mg Oral BID  . lisinopril  2.5 mg Oral Once  . [START ON 12/19/2019] lisinopril  5 mg Oral Daily  . liver oil-zinc oxide   Topical UD  . LORazepam  0-4 mg Intravenous Q12H   Or  . LORazepam  0-4 mg Oral Q12H  . metoprolol tartrate  12.5 mg Oral BID  . multivitamin with minerals  1 tablet Oral Daily  . thiamine  100 mg Oral Daily   Or  . thiamine  100 mg Intravenous Daily   Continuous Infusions: . sodium chloride Stopped (12/16/19 2124)  . piperacillin-tazobactam (ZOSYN)  IV 3.375 g (12/18/19 1417)  . vancomycin 1,000 mg (12/18/19 1707)     LOS: 2 days    Time spent:40 min    Mai Longnecker, Roselind Messier, MD [facility] Pager [phone]  If 7PM-7AM, please contact night-coverage [email or web address] Password [facility] 12/18/2019, 6:15 PM

## 2019-12-19 DIAGNOSIS — N179 Acute kidney failure, unspecified: Secondary | ICD-10-CM

## 2019-12-19 LAB — CBC WITH DIFFERENTIAL/PLATELET
Abs Immature Granulocytes: 0.03 10*3/uL (ref 0.00–0.07)
Basophils Absolute: 0.1 10*3/uL (ref 0.0–0.1)
Basophils Relative: 1 %
Eosinophils Absolute: 0.5 10*3/uL (ref 0.0–0.5)
Eosinophils Relative: 7 %
HCT: 34.7 % — ABNORMAL LOW (ref 39.0–52.0)
Hemoglobin: 11.5 g/dL — ABNORMAL LOW (ref 13.0–17.0)
Immature Granulocytes: 0 %
Lymphocytes Relative: 29 %
Lymphs Abs: 2 10*3/uL (ref 0.7–4.0)
MCH: 34.3 pg — ABNORMAL HIGH (ref 26.0–34.0)
MCHC: 33.1 g/dL (ref 30.0–36.0)
MCV: 103.6 fL — ABNORMAL HIGH (ref 80.0–100.0)
Monocytes Absolute: 0.9 10*3/uL (ref 0.1–1.0)
Monocytes Relative: 13 %
Neutro Abs: 3.4 10*3/uL (ref 1.7–7.7)
Neutrophils Relative %: 50 %
Platelets: 276 10*3/uL (ref 150–400)
RBC: 3.35 MIL/uL — ABNORMAL LOW (ref 4.22–5.81)
RDW: 12.1 % (ref 11.5–15.5)
WBC: 6.8 10*3/uL (ref 4.0–10.5)
nRBC: 0 % (ref 0.0–0.2)

## 2019-12-19 LAB — COMPREHENSIVE METABOLIC PANEL
ALT: 31 U/L (ref 0–44)
AST: 37 U/L (ref 15–41)
Albumin: 1.9 g/dL — ABNORMAL LOW (ref 3.5–5.0)
Alkaline Phosphatase: 39 U/L (ref 38–126)
Anion gap: 7 (ref 5–15)
BUN: 13 mg/dL (ref 6–20)
CO2: 23 mmol/L (ref 22–32)
Calcium: 8.2 mg/dL — ABNORMAL LOW (ref 8.9–10.3)
Chloride: 99 mmol/L (ref 98–111)
Creatinine, Ser: 1.31 mg/dL — ABNORMAL HIGH (ref 0.61–1.24)
GFR calc Af Amer: >60 mL/min (ref >60)
GFR calc non Af Amer: >60 mL/min (ref >60)
Glucose, Bld: 96 mg/dL (ref 70–99)
Potassium: 4.2 mmol/L (ref 3.5–5.1)
Sodium: 129 mmol/L — ABNORMAL LOW (ref 135–145)
Total Bilirubin: 0.8 mg/dL (ref 0.3–1.2)
Total Protein: 7.2 g/dL (ref 6.5–8.1)

## 2019-12-19 LAB — RAPID URINE DRUG SCREEN, HOSP PERFORMED
Amphetamines: NOT DETECTED
Barbiturates: NOT DETECTED
Benzodiazepines: NOT DETECTED
Cocaine: NOT DETECTED
Opiates: NOT DETECTED
Tetrahydrocannabinol: NOT DETECTED

## 2019-12-19 LAB — CREATININE, SERUM
Creatinine, Ser: 1.5 mg/dL — ABNORMAL HIGH (ref 0.61–1.24)
GFR calc Af Amer: >60 mL/min (ref >60)
GFR calc non Af Amer: 53 mL/min — ABNORMAL LOW (ref >60)

## 2019-12-19 LAB — PHOSPHORUS: Phosphorus: 4.7 mg/dL — ABNORMAL HIGH (ref 2.5–4.6)

## 2019-12-19 LAB — BASIC METABOLIC PANEL
Anion gap: 6 (ref 5–15)
BUN: 14 mg/dL (ref 6–20)
CO2: 26 mmol/L (ref 22–32)
Calcium: 8.2 mg/dL — ABNORMAL LOW (ref 8.9–10.3)
Chloride: 99 mmol/L (ref 98–111)
Creatinine, Ser: 1.55 mg/dL — ABNORMAL HIGH (ref 0.61–1.24)
GFR calc Af Amer: 59 mL/min — ABNORMAL LOW (ref >60)
GFR calc non Af Amer: 51 mL/min — ABNORMAL LOW (ref >60)
Glucose, Bld: 102 mg/dL — ABNORMAL HIGH (ref 70–99)
Potassium: 4.2 mmol/L (ref 3.5–5.1)
Sodium: 131 mmol/L — ABNORMAL LOW (ref 135–145)

## 2019-12-19 LAB — MAGNESIUM: Magnesium: 1.9 mg/dL (ref 1.7–2.4)

## 2019-12-19 LAB — URINALYSIS, COMPLETE (UACMP) WITH MICROSCOPIC
Bilirubin Urine: NEGATIVE
Glucose, UA: NEGATIVE mg/dL
Ketones, ur: NEGATIVE mg/dL
Leukocytes,Ua: NEGATIVE
Nitrite: NEGATIVE
Protein, ur: 30 mg/dL — AB
Specific Gravity, Urine: 1.008 (ref 1.005–1.030)
pH: 5 (ref 5.0–8.0)

## 2019-12-19 LAB — VANCOMYCIN, TROUGH: Vancomycin Tr: 20 ug/mL (ref 15–20)

## 2019-12-19 MED ORDER — ZINC OXIDE 40 % EX OINT
TOPICAL_OINTMENT | CUTANEOUS | Status: DC | PRN
  Filled 2019-12-19: qty 57

## 2019-12-19 MED ORDER — LINEZOLID 600 MG/300ML IV SOLN
600.0000 mg | Freq: Two times a day (BID) | INTRAVENOUS | Status: DC
  Administered 2019-12-19 – 2019-12-30 (×22): 600 mg via INTRAVENOUS
  Filled 2019-12-19 (×23): qty 300

## 2019-12-19 MED ORDER — METOPROLOL TARTRATE 25 MG PO TABS
25.0000 mg | ORAL_TABLET | Freq: Two times a day (BID) | ORAL | Status: DC
  Administered 2019-12-19 – 2019-12-20 (×3): 25 mg via ORAL
  Filled 2019-12-19 (×3): qty 1

## 2019-12-19 MED ORDER — HYDRALAZINE HCL 20 MG/ML IJ SOLN: 5.0000 mg | INTRAMUSCULAR | Status: DC | PRN

## 2019-12-19 NOTE — Progress Notes (Signed)
Pharmacy Antibiotic Note  Robert Frank is a 54 y.o. male on day# 4 Vancomycin and Zosyn for cellulitis, bilateral lower extremities.    Creatinine has trended up to 1.31 today.    On Vancomycin 1gm IV q12hrs > last given at 5:33 am today.  Plan:  Will check Vancomcyin trough level prior to next scheduled dose; hold dose until level results are available.  Adjust regimen as needed.  Target Vanc troughs 10-15 mcg/ml, but up to 20 mcg/ml acceptable.  Continue Zosyn 3.375 gm IV q8hrs (each over 4 hours)  Follow renal function, culture data, clinical progress and antibiotic plans.  Noted plan for at least 7 days of antibiotics.  Height: 5\' 7"  (170.2 cm) Weight: 101.5 kg (223 lb 12.3 oz) IBW/kg (Calculated) : 66.1  Temp (24hrs), Avg:98.2 F (36.8 C), Min:98.1 F (36.7 C), Max:98.3 F (36.8 C)  Recent Labs  Lab 12/16/19 0151 12/16/19 0151 12/16/19 1035 12/16/19 1600 12/16/19 2031 12/17/19 0506 12/17/19 1117 12/18/19 0251 12/19/19 0242  WBC 7.1   < > 5.3  --  5.8  --  5.1 6.2 6.8  CREATININE 0.80   < > 0.67 0.76  --  0.64  --  0.85 1.31*  LATICACIDVEN 1.7  --   --   --   --   --   --   --   --    < > = values in this interval not displayed.    Estimated Creatinine Clearance: 74.9 mL/min (A) (by C-G formula based on SCr of 1.31 mg/dL (H)).    No Known Allergies  Antimicrobials this admission:  Vancomycin 7/9 >>  Zosyn 7/9 >>  Dose adjustments this admission:  7/12: VT to be drawn prior to 5pm dose this afternoon  Microbiology results: 7/9 Blood x 2: no growth x 2 days to date 7/9 COVID: negative  Thank you for allowing pharmacy to be a part of this patient's care.  9/9, Dennie Fetters Phone: [phone] 12/19/2019 11:29 AM

## 2019-12-19 NOTE — TOC Initial Note (Signed)
Transition of Care [facility]) - Initial/Assessment Note    Patient Details  Name: Robert Frank MRN: [id number] Date of Birth: [birth year]  [facility]) CM/SW Contact:    Curlene Labrum, RN Phone Number: 12/19/2019, 4:18 PM  Clinical Narrative:                 Case management met the patient at the bedside regarding transitions of care to home.  Patient lives with his brother at the home.  The patient was given education materials regarding ETOH abuse and outpatient followup for treatment.  Patient has seen and been to [place] appointments in the past.  The patient does not drive.  He has felt weak and sick for the past couple of weeks.  The patient admits to drinking 40 oz of beer per day for an unspecified amount of time.  The patient does not have a PCP and was set up with [facility].  The patient does not have insurance but states that he has information on a table at home regarding his eligibility for Medicaid and Medicare.  Will continue to follow for discharge needs.  Expected Discharge Plan: Home/Self Care Barriers to Discharge: Continued Medical Work up   Patient Goals and CMS Choice Patient states their goals for this hospitalization and ongoing recovery are:: The patient would like to return home. CMS Medicare.gov Compare Post Acute Care list provided to:: Patient Choice offered to / list presented to : Patient  Expected Discharge Plan and Services Expected Discharge Plan: Home/Self Care In-house Referral: Development worker, community, PCP / Health Connect Discharge Planning Services: CM Consult, Follow-up appt scheduled Post Acute Care Choice: NA Living arrangements for the past 2 months: Single Family Home                           HH Arranged: NA          Prior Living Arrangements/Services Living arrangements for the past 2 months: Single Family Home Lives with:: Relatives Patient language and need for interpreter reviewed::  Yes Do you feel safe going back to the place where you live?: Yes      Need for Family Participation in Patient Care: Yes (Comment) Care giver support system in place?:  (lives with brother) Current home services: DME (Rolling walker at home) Criminal Activity/Legal Involvement Pertinent to Current Situation/Hospitalization: No - Comment as needed  Activities of Daily Living      Permission Sought/Granted Permission sought to share information with : Case Manager Permission granted to share information with : Yes, Verbal Permission Granted     Permission granted to share info w AGENCY: [facility]        Emotional Assessment Appearance:: Appears older than stated age Attitude/Demeanor/Rapport: Gracious Affect (typically observed): Accepting Orientation: : Oriented to Self, Oriented to  Time, Oriented to Place, Oriented to Situation Alcohol / Substance Use: Tobacco Use, Alcohol Use Psych Involvement: No (comment)  Admission diagnosis:  Hyponatremia [E87.1] Bilateral lower leg cellulitis [L03.116, L03.115] Cellulitis of both lower extremities [L03.115, L03.116] Patient Active Problem List   Diagnosis Date Noted  . Anemia of chronic disease 12/18/2019  . Essential hypertension 12/17/2019  . Bilateral cellulitis of lower leg 12/16/2019  . Bilateral lower leg cellulitis 12/16/2019  . Hypokalemia 12/05/2019  . Hypomagnesemia 12/05/2019  . Generalized weakness 12/04/2019  . Transaminitis 12/04/2019  . Decubitus skin ulcer 12/04/2019  . Hyponatremia 12/04/2019  . Alcohol withdrawal ([place])  08/17/2019  . Encephalopathy acute 08/17/2019  . Alcoholism ([place])   . Cellulitis   . COPD exacerbation ([place]) 10/07/2018  . COPD (chronic obstructive pulmonary disease) ([place]) 10/06/2018  . COPD with acute exacerbation ([place]) 10/06/2018   PCP:  Patient, No Pcp Per Pharmacy:   [facility] [place], [address] [address] Phone: [phone] Fax: [phone]  [facility] [place], [place] [address] [place] [address] Phone: [phone] Fax: [phone]     Social Determinants of Health (SDOH) Interventions    Readmission Risk Interventions Readmission Risk Prevention Plan 12/19/2019  Transportation Screening Complete  PCP or Specialist Appt within 5-7 Days Complete  Home Care Screening Complete  Medication Review (RN CM) Complete  Some recent data might be hidden

## 2019-12-19 NOTE — Progress Notes (Addendum)
Pharmacy Antibiotic Note  Robert Frank is a 54 y.o. male on day# 4 vancomycin and Zosyn for cellulitis, bilateral lower extremities.  WBC 6.8, afebrile; most recent Scr (at 16:15 PM today) was 1.50, down slightly from Scr of 1.55 around noon today, Scr has ~doubled in past couple of days; CrCl 65.4 ml//min  Steady-state vancomycin level drawn prior to 7th dose of vancomycin 1 gm IV Q 12 hr regimen was 20 mg/L, which is at the upper end of the goal range for this pt; this level may not fully reflect the decrease in pt's renal function over past couple of days  Discussed pt's antibiotics with Dr. Joseph Art; the combination of vancomycin/Zosyn may be contributing to rise in Scr. Decision was made to d/c vancomycin and change to linezolid, plt count 276, WBC 6.8 (both stable).  Plan: Discontinue vancomycin and start linezolid 600 mg IV Q 12 hrs Continue Zosyn 3.375 gm IV Q 8 hrs (extended infusion, over 4 hours) Monitor WBC, temp, clinical improvement, cultures, platelet count, WBC  (while on linezolid), renal function (daily Scr while renal function unstable), duration of therapy (noted plan for at least 7 days of antibiotics)  Height: 5\' 7"  (170.2 cm) Weight: 101.5 kg (223 lb 12.3 oz) IBW/kg (Calculated) : 66.1  Temp (24hrs), Avg:98.2 F (36.8 C), Min:98.1 F (36.7 C), Max:98.3 F (36.8 C)  Recent Labs  Lab 12/16/19 0151 12/16/19 0151 12/16/19 1035 12/16/19 1600 12/16/19 2031 12/17/19 0506 12/17/19 1117 12/18/19 0251 12/19/19 0242 12/19/19 1249 12/19/19 1613  WBC 7.1   < > 5.3  --  5.8  --  5.1 6.2 6.8  --   --   CREATININE 0.80   < > 0.67   < >  --  0.64  --  0.85 1.31* 1.55* 1.50*  LATICACIDVEN 1.7  --   --   --   --   --   --   --   --   --   --   VANCOTROUGH  --   --   --   --   --   --   --   --   --   --  20   < > = values in this interval not displayed.    Estimated Creatinine Clearance: 65.4 mL/min (A) (by C-G formula based on SCr of 1.5 mg/dL (H)).    No Known  Allergies  Antimicrobials this admission: Vancomycin 7/9 >> 7/12 Zosyn 7/9 >> Linezolid 7/12 >>  Dose adjustments this admission: None to date  Microbiology results: 7/9 Blood x 2: NG at 3 days 7/9 COVID: negative  Thank you for allowing pharmacy to be a part of this patient's care.  9/9, PharmD, BCPS, [facility] Clinical Pharmacist 12/19/2019 5:14 PM

## 2019-12-19 NOTE — Progress Notes (Signed)
PROGRESS NOTE    Robert SpringMichael S Frank  ZOX:[id number] DOB: [birth year] DOA: 12/15/2019 PCP: Patient, No Pcp Per     Brief Narrative:   54 y.o. WM PMHx  CVA and EtOH abuse   Presents to the emergency department for evaluation of right knee injury after a fall. Patient reports that he is having trouble walking, feels weak all over.   He has had several falls at home over the last few days.  He initially presented for evaluation of right knee pain after a fall yesterday afternoon.  He was admitted for similar complaints last week.  Patient has history of alcoholism.  He reportedly lives in a home that is extremely dirty and run down and home health will not go see it due to safety reasons reportedly.  He states he lives with his brother.  Reports he has been feeling very weak and after his fall earlier today he could not get up by himself and his brother helped him get up in the bed.  He has not been able to get up and ambulate or do any activities around the house.  He does have chronic back pain which still bothers him but is not worse.  He reports he has not drink any alcohol in a week.  He had hyponatremia last week when he was admitted.  Hyponatremia is most likely secondary to his alcoholism.  Reviewing his chart he has chronic hyponatremia.  He states he has not drink after his last discharge from the hospital.  He is oriented to person place and the month but he is unsure of the day of the week.  He is slow to respond and is a poor historian.  He does continue to smoke.  He denies having any fever.  He denies any burning with urination and denies any cough or shortness of breath.  He denies any loss of consciousness.  ED Course: In the emergency room patient is found to have bilateral lower extremity erythema and swelling.  When compared to last week when he was admitted from the pictures in the chart his erythema and swelling is worse.  He again has hyponatremia with a sodium of 122.  Hospital  service been asked to admit for further management   Subjective: 7/12 afebrile overnight A/O x4, very flat affect complains of bilateral lower extremity pain    Assessment & Plan: Covid vaccination; no vaccination   Principal Problem:   Bilateral cellulitis of lower leg Active Problems:   COPD (chronic obstructive pulmonary disease) (HCC)   Alcoholism (HCC)   Generalized weakness   Hyponatremia   Bilateral lower leg cellulitis   Essential hypertension   Anemia of chronic disease  Bilateral lower extremity cellulitis -Continue current antibiotics for minimum 7 days -9/12 combination of Zosyn + vancomycin has become nephrotoxic.  Creatinine increasing see AKI  -7/10 W OC consult best way to defoliate bilateral lower extremities and moisturize  Hyponatremia -Most likely secondary to alcohol abuse -Normal saline 4240ml/hr Recent Labs  Lab 12/16/19 1035 12/16/19 1600 12/17/19 0506 12/18/19 0251 12/19/19 0242  NA 128* 127* 130* 131* 129*  -Improving  CHF -Patient has all hallmarks of CHF fluid retention bilateral lower extremities to the hip -7/10 echocardiogram; normal cardiac function see results below.  CHF has been ruled out  Essential HTN -7/11 increase lisinopril 5 mg daily  -7/12 increase Metoprolol 25 mg BID -7/12 Hydralazine PRN  COPD -DuoNeb q 4 hr PRN -Incentive spirometry  AKI -Creatinine had a significant increase  overnight in Cr not sure I believe lab.  Repeat lab Recent Labs  Lab 12/16/19 1600 12/17/19 0506 12/18/19 0251 12/19/19 0242 12/19/19 1249  CREATININE 0.76 0.64 0.85 1.31* 1.55*  -Repeat lab confirm patient having increase in creatinine. -7/12 discussed case with pharmacy will DC Zosyn and vancomycin begin patient on linezolid.   -Hydrate normal saline 85ml/hr -7/12 urinalysis pending -7/12 urine culture pending  Generalized weakness -Secondary to alcoholism? -We will consult PT/OT  EtOH abuse -CIWA protocol  Anemia of chronic  disease  -Most likely secondary to alcoholism -Anemia panel; most consistent with anemia of chronic disease, most likely secondary to his alcoholism   DVT prophylaxis: Lovenox Code Status: Full Family Communication:  Status is: Inpatient    Dispo: The patient is from: Home              Anticipated d/c is to: Home              Anticipated d/c date is: 7/14              Patient currently unstable      Consultants:    Procedures/Significant Events:  7/10 Echocardiogram;Left Ventricle: LVEF=60 to 65%.  -Left ventricular diastolic parameters were normal.      I have personally reviewed and interpreted all radiology studies and my findings are as above.  VENTILATOR SETTINGS:    Cultures   Antimicrobials: Anti-infectives (From admission, onward)   Start     Ordered Stop   12/16/19 1700  vancomycin (VANCOCIN) IVPB 1000 mg/200 mL premix     Discontinue     12/16/19 0628     12/16/19 1400  piperacillin-tazobactam (ZOSYN) IVPB 3.375 g     Discontinue     12/16/19 0628     12/16/19 0630  piperacillin-tazobactam (ZOSYN) IVPB 3.375 g        12/16/19 0628 12/16/19 0715   12/16/19 0345  vancomycin (VANCOREADY) IVPB 2000 mg/400 mL        12/16/19 0342 12/16/19 0642       Devices    LINES / TUBES:      Continuous Infusions: . sodium chloride Stopped (12/16/19 2124)  . piperacillin-tazobactam (ZOSYN)  IV 3.375 g (12/19/19 0717)  . vancomycin 1,000 mg (12/19/19 0533)     Objective: Vitals:   12/18/19 0848 12/18/19 2013 12/19/19 0348 12/19/19 0800  BP: (!) 135/96 (!) 160/106 (!) 156/98 (!) 162/103  Pulse: 91 87 78 84  Resp: 18 18 18 17   Temp: 98.5 F (36.9 C) 98.3 F (36.8 C) 98.1 F (36.7 C) 98.1 F (36.7 C)  TempSrc: Oral Oral Oral Oral  SpO2: 97% 99% 98% 96%  Weight:      Height:        Intake/Output Summary (Last 24 hours) at 12/19/2019 0853 Last data filed at 12/19/2019 0254 Gross per 24 hour  Intake 720 ml  Output 1751 ml  Net -1031 ml    Filed Weights   12/16/19 1154  Weight: 101.5 kg    Physical Exam:  General: A/O x4, minimally cooperative (did not want to answer questions), No acute respiratory distress Eyes: negative scleral hemorrhage, negative anisocoria, negative icterus ENT: Negative Runny nose, negative gingival bleeding, Neck:  Negative scars, masses, torticollis, lymphadenopathy, JVD Lungs: Clear to auscultation bilaterally without wheezes or crackles Cardiovascular: Regular rate and rhythm without murmur gallop or rub normal S1 and S2 Abdomen: negative abdominal pain, nondistended, positive soft, bowel sounds, no rebound, no ascites, no appreciable mass Extremities:bilateral lower extremity edema  to hip.  Significant scaly breakdown of skin bilateral calf area (painful to palpation) Psychiatric:  Negative depression, negative anxiety, negative fatigue, negative mania  Central nervous system:  Cranial nerves II through XII intact, tongue/uvula midline, all extremities muscle strength 5/5, sensation intact throughout,  negative dysarthria, negative expressive aphasia, negative receptive aphasia.    Data Reviewed: Care during the described time interval was provided by me .  I have reviewed this patient's available data, including medical history, events of note, physical examination, and all test results as part of my evaluation.  CBC: Recent Labs  Lab 12/16/19 0151 12/16/19 0151 12/16/19 1035 12/16/19 2031 12/17/19 1117 12/18/19 0251 12/19/19 0242  WBC 7.1   < > 5.3 5.8 5.1 6.2 6.8  NEUTROABS 4.8  --   --  3.9 2.4 3.2 3.4  HGB 12.5*   < > 12.4* 11.6* 11.3* 11.3* 11.5*  HCT 36.9*   < > 36.7* 34.4* 34.6* 33.8* 34.7*  MCV 104.2*   < > 103.7* 103.3* 104.8* 103.7* 103.6*  PLT 300   < > 329 326 300 300 276   < > = values in this interval not displayed.   Basic Metabolic Panel: Recent Labs  Lab 12/16/19 1035 12/16/19 1600 12/17/19 0506 12/18/19 0251 12/19/19 0242  NA 128* 127* 130* 131* 129*   K 4.3 4.2 3.8 3.8 4.2  CL 98 97* 100 100 99  CO2 23 21* GLUCOSE 129* 119* 110* 102* 96  BUN CREATININE 0.67 0.76 0.64 0.85 1.31*  CALCIUM 8.4* 8.2* 8.3* 8.4* 8.2*  MG  --   --  1.8 1.8 1.9  PHOS  --   --  4.3 4.2 4.7*   GFR: Estimated Creatinine Clearance: 74.9 mL/min (A) (by C-G formula based on SCr of 1.31 mg/dL (H)). Liver Function Tests: Recent Labs  Lab 12/18/19 0251 12/19/19 0242  AST 45* 37  ALT 41 31  ALKPHOS 37* 39  BILITOT 0.6 0.8  PROT 7.4 7.2  ALBUMIN 1.9* 1.9*   No results for input(s): LIPASE, AMYLASE in the last 168 hours. No results for input(s): AMMONIA in the last 168 hours. Coagulation Profile: No results for input(s): INR, PROTIME in the last 168 hours. Cardiac Enzymes: No results for input(s): CKTOTAL, CKMB, CKMBINDEX, TROPONINI in the last 168 hours. BNP (last 3 results) No results for input(s): PROBNP in the last 8760 hours. HbA1C: No results for input(s): HGBA1C in the last 72 hours. CBG: No results for input(s): GLUCAP in the last 168 hours. Lipid Profile: No results for input(s): CHOL, HDL, LDLCALC, TRIG, CHOLHDL, LDLDIRECT in the last 72 hours. Thyroid Function Tests: No results for input(s): TSH, T4TOTAL, FREET4, T3FREE, THYROIDAB in the last 72 hours. Anemia Panel: Recent Labs    12/18/19 0251  VITAMINB12 301  FOLATE 17.8  FERRITIN 754*  TIBC 182*  IRON 45  RETICCTPCT 2.2   Sepsis Labs: Recent Labs  Lab 12/16/19 0151  LATICACIDVEN 1.7    Recent Results (from the past 240 hour(s))  Culture, blood (Routine X 2) w Reflex to ID Panel     Status: None (Preliminary result)   Collection Time: 12/16/19  1:43 AM   Specimen: BLOOD  Result Value Ref Range Status   Specimen Description BLOOD LEFT ANTECUBITAL  Final   Special Requests   Final    BOTTLES DRAWN AEROBIC AND ANAEROBIC Blood Culture results may not be optimal due to an inadequate volume of blood received in culture  bottles   Culture   Final     NO GROWTH 2 DAYS Performed at [facility], [address] [place], [place] [address]    Report Status PENDING  Incomplete  Culture, blood (Routine X 2) w Reflex to ID Panel     Status: None (Preliminary result)   Collection Time: 12/16/19  2:39 AM   Specimen: BLOOD  Result Value Ref Range Status   Specimen Description BLOOD SITE NOT SPECIFIED  Final   Special Requests   Final    BOTTLES DRAWN AEROBIC AND ANAEROBIC Blood Culture results may not be optimal due to an inadequate volume of blood received in culture bottles   Culture   Final    NO GROWTH 2 DAYS Performed at [facility], [address] [place], [place] [address]    Report Status PENDING  Incomplete  SARS Coronavirus 2 by RT PCR (hospital order, performed in [facility] hospital lab) Nasopharyngeal Nasopharyngeal Swab     Status: None   Collection Time: 12/16/19  3:48 AM   Specimen: Nasopharyngeal Swab  Result Value Ref Range Status   SARS Coronavirus 2 NEGATIVE NEGATIVE Final    Comment: (NOTE) SARS-CoV-2 target nucleic acids are NOT DETECTED.  The SARS-CoV-2 RNA is generally detectable in upper and lower respiratory specimens during the acute phase of infection. The lowest concentration of SARS-CoV-2 viral copies this assay can detect is 250 copies / mL. A negative result does not preclude SARS-CoV-2 infection and should not be used as the sole basis for treatment or other patient management decisions.  A negative result may occur with improper specimen collection / handling, submission of specimen other than nasopharyngeal swab, presence of viral mutation(s) within the areas targeted by this assay, and inadequate number of viral copies (<250 copies / mL). A negative result must be combined with clinical observations, patient history, and epidemiological information.  Fact Sheet for Patients:   BoilerBrush.com.cy  Fact Sheet for Healthcare  Providers: https://pope.com/  This test is not yet approved or  cleared by the [place] FDA and has been authorized for detection and/or diagnosis of SARS-CoV-2 by FDA under an Emergency Use Authorization (EUA).  This EUA will remain in effect (meaning this test can be used) for the duration of the COVID-19 declaration under Section 564(b)(1) of the Act, 21 U.S.C. section 360bbb-3(b)(1), unless the authorization is terminated or revoked sooner.  Performed at [facility], [address] [place], [place] [address]          Radiology Studies: ECHOCARDIOGRAM COMPLETE  Result Date: 12/17/2019    ECHOCARDIOGRAM REPORT   Patient Name:   Robert Frank Date of Exam: 12/17/2019 Medical Rec #:  [id number]         Height:       67.0 in Accession #:    [id number]        Weight:       223.8 lb Date of Birth:  Dec 23, 1965         BSA:          2.121 m Patient Age:    54 years          BP:           135/76 mmHg Patient Gender: M                 HR:           93 bpm. Exam Location:  Inpatient Procedure: 2D Echo, Cardiac  Doppler and Color Doppler Indications:    CHF (congestive heart failure)  History:        Patient has no prior history of Echocardiogram examinations.                 COPD and Stroke; Risk Factors:Hypertension. Alcoholism.  Sonographer:    Celesta Gentile RCS Referring Phys: [id number] Matin Mattioli J Rosealyn Little IMPRESSIONS  1. Left ventricular ejection fraction, by estimation, is 60 to 65%. The left ventricle has normal function. The left ventricle has no regional wall motion abnormalities. There is mild left ventricular hypertrophy. Left ventricular diastolic parameters were normal.  2. Right ventricular systolic function is normal. The right ventricular size is normal.  3. The mitral valve is normal in structure. No evidence of mitral valve regurgitation. No evidence of mitral stenosis.  4. The aortic valve was not well visualized. Aortic valve regurgitation is not  visualized. Mild aortic valve sclerosis is present, with no evidence of aortic valve stenosis.  5. The inferior vena cava is normal in size with greater than 50% respiratory variability, suggesting right atrial pressure of 3 mmHg. FINDINGS  Left Ventricle: Left ventricular ejection fraction, by estimation, is 60 to 65%. The left ventricle has normal function. The left ventricle has no regional wall motion abnormalities. The left ventricular internal cavity size was normal in size. There is  mild left ventricular hypertrophy. Left ventricular diastolic parameters were normal. Right Ventricle: The right ventricular size is normal. No increase in right ventricular wall thickness. Right ventricular systolic function is normal. Left Atrium: Left atrial size was normal in size. Right Atrium: Right atrial size was normal in size. Pericardium: There is no evidence of pericardial effusion. Mitral Valve: The mitral valve is normal in structure. Normal mobility of the mitral valve leaflets. No evidence of mitral valve regurgitation. No evidence of mitral valve stenosis. Tricuspid Valve: The tricuspid valve is normal in structure. Tricuspid valve regurgitation is mild . No evidence of tricuspid stenosis. Aortic Valve: The aortic valve was not well visualized. Aortic valve regurgitation is not visualized. Mild aortic valve sclerosis is present, with no evidence of aortic valve stenosis. Pulmonic Valve: The pulmonic valve was normal in structure. Pulmonic valve regurgitation is not visualized. No evidence of pulmonic stenosis. Aorta: The aortic root is normal in size and structure. Venous: The inferior vena cava is normal in size with greater than 50% respiratory variability, suggesting right atrial pressure of 3 mmHg. IAS/Shunts: No atrial level shunt detected by color flow Doppler.  LEFT VENTRICLE PLAX 2D LVIDd:         4.60 cm  Diastology LVIDs:         3.20 cm  LV e' lateral:   9.90 cm/s LV PW:         1.20 cm  LV E/e'  lateral: 11.2 LV IVS:        1.40 cm  LV e' medial:    9.25 cm/s LVOT diam:     2.10 cm  LV E/e' medial:  12.0 LV SV:         81 LV SV Index:   38 LVOT Area:     3.46 cm  RIGHT VENTRICLE RV S prime:     14.90 cm/s TAPSE (M-mode): 2.3 cm LEFT ATRIUM             Index       RIGHT ATRIUM           Index LA diam:  3.50 cm 1.65 cm/m  RA Area:     18.40 cm LA Vol (A2C):   64.8 ml 30.55 ml/m RA Volume:   52.40 ml  24.70 ml/m LA Vol (A4C):   77.8 ml 36.68 ml/m LA Biplane Vol: 72.8 ml 34.32 ml/m  AORTIC VALVE LVOT Vmax:   115.00 cm/s LVOT Vmean:  78.900 cm/s LVOT VTI:    0.235 m  AORTA Ao Root diam: 3.10 cm MITRAL VALVE MV Area (PHT): 4.31 cm     SHUNTS MV Decel Time: 176 msec     Systemic VTI:  0.24 m MV E velocity: 111.00 cm/s  Systemic Diam: 2.10 cm MV A velocity: 85.30 cm/s MV E/A ratio:  1.30 Charlton Haws MD Electronically signed by Charlton Haws MD Signature Date/Time: 12/17/2019/9:35:15 PM    Final         Scheduled Meds: . aspirin EC  81 mg Oral Daily  . enoxaparin (LOVENOX) injection  50 mg Subcutaneous Q24H  . folic acid  1 mg Oral Daily  . gabapentin  100 mg Oral BID  . lisinopril  2.5 mg Oral Once  . lisinopril  5 mg Oral Daily  . liver oil-zinc oxide   Topical UD  . LORazepam  0-4 mg Intravenous Q12H   Or  . LORazepam  0-4 mg Oral Q12H  . metoprolol tartrate  12.5 mg Oral BID  . multivitamin with minerals  1 tablet Oral Daily  . thiamine  100 mg Oral Daily   Or  . thiamine  100 mg Intravenous Daily   Continuous Infusions: . sodium chloride Stopped (12/16/19 2124)  . piperacillin-tazobactam (ZOSYN)  IV 3.375 g (12/19/19 0717)  . vancomycin 1,000 mg (12/19/19 0533)     LOS: 3 days    Time spent:40 min    Sidney Kann, Roselind Messier, MD [facility] Pager [phone]  If 7PM-7AM, please contact night-coverage [email or web address] Password [facility] 12/19/2019, 8:53 AM

## 2019-12-19 NOTE — Progress Notes (Signed)
Strong odor of cigarette smoke coming from patient room.  AD entered room and provided education on danger of smoking while in hospital.  Patient confirmed that he has been smoking, and asked AD to "pleae don't tell anybody who will get me in trouble".  Further education provided on danger and Nicotine patch offered.  Patient declines patch.  Patient states that he has no more cigarettes in room.  AD notified patient's RN.

## 2019-12-19 NOTE — TOC CAGE-AID Note (Signed)
Transition of Care [facility]) - CAGE-AID Screening   Patient Details  Name: Robert Frank MRN: [id number] Date of Birth: 1965-12-09  [facility]) CM/SW Contact:    Curlene Labrum, RN Phone Number: 12/19/2019, 4:16 PM   Clinical Narrative:  Case management met with the patient at the bedside and ETOH counseling education materials offered to the patient.  CAGE-AID Screening:    Have You Ever Felt You Ought to Cut Down on Your Drinking or Drug Use?: No Have People Annoyed You By Critizing Your Drinking Or Drug Use?: No Have You Felt Bad Or Guilty About Your Drinking Or Drug Use?: No Have You Ever Had a Drink or Used Drugs First Thing In The Morning to Steady Your Nerves or to Get Rid of a Hangover?: Yes CAGE-AID Score: 1  Substance Abuse Education Offered: Yes  Substance abuse interventions: Scientist, clinical (histocompatibility and immunogenetics), Patient Counseling

## 2019-12-20 ENCOUNTER — Inpatient Hospital Stay (HOSPITAL_COMMUNITY): Payer: Self-pay

## 2019-12-20 LAB — CBC WITH DIFFERENTIAL/PLATELET
Abs Immature Granulocytes: 0.03 10*3/uL (ref 0.00–0.07)
Basophils Absolute: 0.1 10*3/uL (ref 0.0–0.1)
Basophils Relative: 1 %
Eosinophils Absolute: 0.5 10*3/uL (ref 0.0–0.5)
Eosinophils Relative: 7 %
HCT: 35.4 % — ABNORMAL LOW (ref 39.0–52.0)
Hemoglobin: 11.8 g/dL — ABNORMAL LOW (ref 13.0–17.0)
Immature Granulocytes: 0 %
Lymphocytes Relative: 23 %
Lymphs Abs: 1.8 10*3/uL (ref 0.7–4.0)
MCH: 34.8 pg — ABNORMAL HIGH (ref 26.0–34.0)
MCHC: 33.3 g/dL (ref 30.0–36.0)
MCV: 104.4 fL — ABNORMAL HIGH (ref 80.0–100.0)
Monocytes Absolute: 1 10*3/uL (ref 0.1–1.0)
Monocytes Relative: 13 %
Neutro Abs: 4.4 10*3/uL (ref 1.7–7.7)
Neutrophils Relative %: 56 %
Platelets: 277 10*3/uL (ref 150–400)
RBC: 3.39 MIL/uL — ABNORMAL LOW (ref 4.22–5.81)
RDW: 12.1 % (ref 11.5–15.5)
WBC: 7.7 10*3/uL (ref 4.0–10.5)
nRBC: 0 % (ref 0.0–0.2)

## 2019-12-20 LAB — COMPREHENSIVE METABOLIC PANEL
ALT: 27 U/L (ref 0–44)
AST: 29 U/L (ref 15–41)
Albumin: 1.9 g/dL — ABNORMAL LOW (ref 3.5–5.0)
Alkaline Phosphatase: 39 U/L (ref 38–126)
Anion gap: 8 (ref 5–15)
BUN: 19 mg/dL (ref 6–20)
CO2: 22 mmol/L (ref 22–32)
Calcium: 8 mg/dL — ABNORMAL LOW (ref 8.9–10.3)
Chloride: 99 mmol/L (ref 98–111)
Creatinine, Ser: 1.9 mg/dL — ABNORMAL HIGH (ref 0.61–1.24)
GFR calc Af Amer: 46 mL/min — ABNORMAL LOW (ref >60)
GFR calc non Af Amer: 40 mL/min — ABNORMAL LOW (ref >60)
Glucose, Bld: 101 mg/dL — ABNORMAL HIGH (ref 70–99)
Potassium: 4.4 mmol/L (ref 3.5–5.1)
Sodium: 129 mmol/L — ABNORMAL LOW (ref 135–145)
Total Bilirubin: 0.2 mg/dL — ABNORMAL LOW (ref 0.3–1.2)
Total Protein: 7.1 g/dL (ref 6.5–8.1)

## 2019-12-20 LAB — PHOSPHORUS: Phosphorus: 4.8 mg/dL — ABNORMAL HIGH (ref 2.5–4.6)

## 2019-12-20 LAB — MAGNESIUM: Magnesium: 1.8 mg/dL (ref 1.7–2.4)

## 2019-12-20 MED ORDER — HYDRALAZINE HCL 20 MG/ML IJ SOLN
10.0000 mg | Freq: Four times a day (QID) | INTRAMUSCULAR | Status: DC
  Administered 2019-12-20 – 2019-12-22 (×6): 10 mg via INTRAVENOUS
  Filled 2019-12-20 (×7): qty 1

## 2019-12-20 MED ORDER — METOPROLOL TARTRATE 25 MG PO TABS
25.0000 mg | ORAL_TABLET | Freq: Once | ORAL | Status: AC
  Administered 2019-12-20: 25 mg via ORAL
  Filled 2019-12-20: qty 1

## 2019-12-20 MED ORDER — METOPROLOL TARTRATE 50 MG PO TABS
50.0000 mg | ORAL_TABLET | Freq: Two times a day (BID) | ORAL | Status: DC
  Administered 2019-12-20 – 2019-12-27 (×15): 50 mg via ORAL
  Filled 2019-12-20 (×15): qty 1

## 2019-12-20 MED ORDER — LORAZEPAM 2 MG/ML IJ SOLN: 1.0000 mg | Freq: Once | INTRAMUSCULAR | Status: DC

## 2019-12-20 NOTE — Progress Notes (Signed)
Robert Frank  MWU:[id number] DOB: 09-07-1965 DOA: 12/15/2019 PCP: Patient, No Pcp Per     Brief Narrative:   54 y.o. WM PMHx  CVA and EtOH abuse   Presents to the emergency department for evaluation of right knee injury after a fall. Patient reports that he is having trouble walking, feels weak all over.   He has had several falls at home over the last few days.  He initially presented for evaluation of right knee pain after a fall yesterday afternoon.  He was admitted for similar complaints last week.  Patient has history of alcoholism.  He reportedly lives in a home that is extremely dirty and run down and home health will not go see it due to safety reasons reportedly.  He states he lives with his brother.  Reports he has been feeling very weak and after his fall earlier today he could not get up by himself and his brother helped him get up in the bed.  He has not been able to get up and ambulate or do any activities around the house.  He does have chronic back pain which still bothers him but is not worse.  He reports he has not drink any alcohol in a week.  He had hyponatremia last week when he was admitted.  Hyponatremia is most likely secondary to his alcoholism.  Reviewing his chart he has chronic hyponatremia.  He states he has not drink after his last discharge from the hospital.  He is oriented to person place and the month but he is unsure of the day of the week.  He is slow to respond and is a poor historian.  He does continue to smoke.  He denies having any fever.  He denies any burning with urination and denies any cough or shortness of breath.  He denies any loss of consciousness.  ED Course: In the emergency room patient is found to have bilateral lower extremity erythema and swelling.  When compared to last week when he was admitted from the pictures in the chart his erythema and swelling is worse.  He again has hyponatremia with a sodium of 122.  Hospital  service been asked to admit for further management   Subjective: 7/13 afebrile overnight, A/O x4, very flat affect.  Complains of continued increasing bilateral lower extremity pain.     Assessment & Plan: Covid vaccination; no vaccination   Principal Problem:   Bilateral cellulitis of lower leg Active Problems:   COPD (chronic obstructive pulmonary disease) (HCC)   Alcoholism (HCC)   Generalized weakness   Hyponatremia   Bilateral lower leg cellulitis   Essential hypertension   Anemia of chronic disease  Bilateral lower extremity cellulitis -Continue current antibiotics for minimum 7 days  -7/10 W OC consult best way to defoliate bilateral lower extremities and moisturize -7/12 combination of Zosyn + vancomycin has become nephrotoxic.  Creatinine increasing see AKI -7/13 vancomycin + Zosyn discontinued secondary to increasing creatinine -7/13 Linezolid started -7/13 now that patient's swelling decreased on legs feels as if there may be abscess present especially in the posterior aspect of the left leg.  Obtain MRI bilateral lower extremity rule out abscess  Hyponatremia -Most likely secondary to alcohol abuse -Normal saline 52ml/hr Recent Labs  Lab 12/17/19 0506 12/18/19 0251 12/19/19 0242 12/19/19 1249 12/20/19 0418  NA 130* 131* 129* 131* 129*  -Improving -7/13 serum osmolality pending, urine osmolality pending, urine electrolytes pending, uric acid pending  CHF -  Patient has all hallmarks of CHF fluid retention bilateral lower extremities to the hip -7/10 echocardiogram; normal cardiac function see results below.  CHF has been ruled out  Essential HTN -7/13 lisinopril 5 mg daily (held) secondary to increasing creatinine -7/13 increase Metoprolol 50 mg BID -7/13 Hydralazine 10 mg QID  COPD -DuoNeb q 4 hr PRN -Incentive spirometry  AKI -Creatinine had a significant increase overnight in Cr not sure I believe lab.  Repeat lab Recent Labs  Lab 12/18/19 0251  12/19/19 0242 12/19/19 1249 12/19/19 1613 12/20/19 0418  CREATININE 0.85 1.31* 1.55* 1.50* 1.90*  -Repeat lab confirm patient having increase in creatinine. -7/12 discussed case with pharmacy will DC Zosyn and vancomycin begin patient on linezolid.   -Hydrate normal saline 45ml/hr -7/12 urinalysis nondiagnostic for infection -7/12 urine culture pending  Generalized weakness -Secondary to alcoholism? -consult PT/OT when patient more stable  EtOH abuse -CIWA protocol  Anemia of chronic disease  -Most likely secondary to alcoholism -Anemia panel; most consistent with anemia of chronic disease, most likely secondary to his alcoholism   DVT prophylaxis: Lovenox Code Status: Full Family Communication:  Status is: Inpatient    Dispo: The patient is from: Home              Anticipated d/c is to: Home              Anticipated d/c date is: 7/14              Patient currently unstable      Consultants:    Procedures/Significant Events:  7/10 Echocardiogram;Left Ventricle: LVEF=60 to 65%.  -Left ventricular diastolic parameters were normal.      I have personally reviewed and interpreted all radiology studies and my findings are as above.  VENTILATOR SETTINGS:    Cultures 7/9 blood AC NGTD 7/9 blood NGTD 7/12 urine pending    Antimicrobials: Anti-infectives (From admission, onward)   Start     Ordered Stop   12/19/19 1800  linezolid (ZYVOX) IVPB 600 mg     Discontinue     12/19/19 1746     12/16/19 1700  vancomycin (VANCOCIN) IVPB 1000 mg/200 mL premix  Status:  Discontinued        12/16/19 0628 12/19/19 1746   12/16/19 1400  piperacillin-tazobactam (ZOSYN) IVPB 3.375 g  Status:  Discontinued        12/16/19 0628 12/20/19 1602   12/16/19 0630  piperacillin-tazobactam (ZOSYN) IVPB 3.375 g        12/16/19 0628 12/16/19 0715   12/16/19 0345  vancomycin (VANCOREADY) IVPB 2000 mg/400 mL        12/16/19 0342 12/16/19 0642       Devices    LINES /  TUBES:      Continuous Infusions: . sodium chloride 75 mL/hr at 12/20/19 1542  . linezolid (ZYVOX) IV 600 mg (12/20/19 0509)  . piperacillin-tazobactam (ZOSYN)  IV 3.375 g (12/20/19 1543)     Objective: Vitals:   12/19/19 2106 12/20/19 0506 12/20/19 0756 12/20/19 1510  BP: (!) 171/118 (!) 155/105 (!) 161/98 (!) 155/101  Pulse: 88 85 85 85  Resp: 18 18 14 17   Temp: 98.1 F (36.7 C) 97.6 F (36.4 C) 97.6 F (36.4 C) 97.9 F (36.6 C)  TempSrc: Oral Oral Oral Oral  SpO2: 98% 100% 98% 100%  Weight:      Height:        Intake/Output Summary (Last 24 hours) at 12/20/2019 1554 Last data filed at 12/20/2019 0900 Gross  per 24 hour  Intake 1211.5 ml  Output 1550 ml  Net -338.5 ml   Filed Weights   12/16/19 1154  Weight: 101.5 kg   Physical Exam: General: A/O x4, No acute respiratory distress Eyes: negative scleral hemorrhage, negative anisocoria, negative icterus ENT: Negative Runny nose, negative gingival bleeding, Neck:  Negative scars, masses, torticollis, lymphadenopathy, JVD Lungs: Clear to auscultation bilaterally without wheezes or crackles Cardiovascular: Regular rate and rhythm without murmur gallop or rub normal S1 and S2 Abdomen: negative abdominal pain, nondistended, positive soft, bowel sounds, no rebound, no ascites, no appreciable mass Extremities: now that swelling somewhat reduced in bilateral lower extremities feels as if patient has abscess in bilateral lower extremities posterior aspect of calf LEFT>> RIGHT Skin: Significant scaly breakdown of skin bilateral calf area Psychiatric:  Negative depression, negative anxiety, negative fatigue, negative mania  Central nervous system:  Cranial nerves II through XII intact, tongue/uvula midline, all extremities muscle strength 5/5, sensation intact throughout, f negative dysarthria, negative expressive aphasia, negative receptive aphasia. Physical Exam:     Data Reviewed: Care during the described time interval  was provided by me .  I have reviewed this patient's available data, including medical history, events of note, physical examination, and all test results as part of my evaluation.  CBC: Recent Labs  Lab 12/16/19 2031 12/17/19 1117 12/18/19 0251 12/19/19 0242 12/20/19 0418  WBC 5.8 5.1 6.2 6.8 7.7  NEUTROABS 3.9 2.4 3.2 3.4 4.4  HGB 11.6* 11.3* 11.3* 11.5* 11.8*  HCT 34.4* 34.6* 33.8* 34.7* 35.4*  MCV 103.3* 104.8* 103.7* 103.6* 104.4*  PLT 326 300 300 276 277   Basic Metabolic Panel: Recent Labs  Lab 12/17/19 0506 12/17/19 0506 12/18/19 0251 12/19/19 0242 12/19/19 1249 12/19/19 1613 12/20/19 0418  NA 130*  --  131* 129* 131*  --  129*  K 3.8  --  3.8 4.2 4.2  --  4.4  CL 100  --  100 99 99  --  99  CO2 24  --  --  22  GLUCOSE 110*  --  102* 96 102*  --  101*  BUN 12  --  --  19  CREATININE 0.64   < > 0.85 1.31* 1.55* 1.50* 1.90*  CALCIUM 8.3*  --  8.4* 8.2* 8.2*  --  8.0*  MG 1.8  --  1.8 1.9  --   --  1.8  PHOS 4.3  --  4.2 4.7*  --   --  4.8*   < > = values in this interval not displayed.   GFR: Estimated Creatinine Clearance: 51.7 mL/min (A) (by C-G formula based on SCr of 1.9 mg/dL (H)). Liver Function Tests: Recent Labs  Lab 12/18/19 0251 12/19/19 0242 12/20/19 0418  AST 45* 37 29  ALT 41 31 27  ALKPHOS 37* 39 39  BILITOT 0.6 0.8 0.2*  PROT 7.4 7.2 7.1  ALBUMIN 1.9* 1.9* 1.9*   No results for input(s): LIPASE, AMYLASE in the last 168 hours. No results for input(s): AMMONIA in the last 168 hours. Coagulation Profile: No results for input(s): INR, PROTIME in the last 168 hours. Cardiac Enzymes: No results for input(s): CKTOTAL, CKMB, CKMBINDEX, TROPONINI in the last 168 hours. BNP (last 3 results) No results for input(s): PROBNP in the last 8760 hours. HbA1C: No results for input(s): HGBA1C in the last 72 hours. CBG: No results for input(s): GLUCAP in the last 168 hours. Lipid Profile: No results for input(s):  CHOL, HDL,  LDLCALC, TRIG, CHOLHDL, LDLDIRECT in the last 72 hours. Thyroid Function Tests: No results for input(s): TSH, T4TOTAL, FREET4, T3FREE, THYROIDAB in the last 72 hours. Anemia Panel: Recent Labs    12/18/19 0251  VITAMINB12 301  FOLATE 17.8  FERRITIN 754*  TIBC 182*  IRON 45  RETICCTPCT 2.2   Sepsis Labs: Recent Labs  Lab 12/16/19 0151  LATICACIDVEN 1.7    Recent Results (from the past 240 hour(s))  Culture, blood (Routine X 2) w Reflex to ID Panel     Status: None (Preliminary result)   Collection Time: 12/16/19  1:43 AM   Specimen: BLOOD  Result Value Ref Range Status   Specimen Description BLOOD LEFT ANTECUBITAL  Final   Special Requests   Final    BOTTLES DRAWN AEROBIC AND ANAEROBIC Blood Culture results may not be optimal due to an inadequate volume of blood received in culture bottles   Culture   Final    NO GROWTH 4 DAYS Performed at [facility], [address] [place], [place] [address]    Report Status PENDING  Incomplete  Culture, blood (Routine X 2) w Reflex to ID Panel     Status: None (Preliminary result)   Collection Time: 12/16/19  2:39 AM   Specimen: BLOOD  Result Value Ref Range Status   Specimen Description BLOOD SITE NOT SPECIFIED  Final   Special Requests   Final    BOTTLES DRAWN AEROBIC AND ANAEROBIC Blood Culture results may not be optimal due to an inadequate volume of blood received in culture bottles   Culture   Final    NO GROWTH 4 DAYS Performed at [facility], [address] [place], [place] [address]    Report Status PENDING  Incomplete  SARS Coronavirus 2 by RT PCR (hospital order, performed in [facility] hospital lab) Nasopharyngeal Nasopharyngeal Swab     Status: None   Collection Time: 12/16/19  3:48 AM   Specimen: Nasopharyngeal Swab  Result Value Ref Range Status   SARS Coronavirus 2 NEGATIVE NEGATIVE Final    Comment: (NOTE) SARS-CoV-2 target nucleic acids are NOT DETECTED.  The SARS-CoV-2 RNA is generally  detectable in upper and lower respiratory specimens during the acute phase of infection. The lowest concentration of SARS-CoV-2 viral copies this assay can detect is 250 copies / mL. A negative result does not preclude SARS-CoV-2 infection and should not be used as the sole basis for treatment or other patient management decisions.  A negative result may occur with improper specimen collection / handling, submission of specimen other than nasopharyngeal swab, presence of viral mutation(s) within the areas targeted by this assay, and inadequate number of viral copies (<250 copies / mL). A negative result must be combined with clinical observations, patient history, and epidemiological information.  Fact Sheet for Patients:   BoilerBrush.com.cyhttps://www.fda.gov/media/136312/download  Fact Sheet for Healthcare Providers: https://pope.com/https://www.fda.gov/media/136313/download  This test is not yet approved or  cleared by the [place] FDA and has been authorized for detection and/or diagnosis of SARS-CoV-2 by FDA under an Emergency Use Authorization (EUA).  This EUA will remain in effect (meaning this test can be used) for the duration of the COVID-19 declaration under Section 564(b)(1) of the Act, 21 U.S.C. section 360bbb-3(b)(1), unless the authorization is terminated or revoked sooner.  Performed at [facility], [address] [place], [place] [address]          Radiology Studies: No results found.  Scheduled Meds: . aspirin EC  81 mg Oral Daily  . enoxaparin (LOVENOX) injection  50 mg Subcutaneous Q24H  . folic acid  1 mg Oral Daily  . gabapentin  100 mg Oral BID  . metoprolol tartrate  25 mg Oral BID  . multivitamin with minerals  1 tablet Oral Daily  . thiamine  100 mg Oral Daily   Continuous Infusions: . sodium chloride 75 mL/hr at 12/20/19 1542  . linezolid (ZYVOX) IV 600 mg (12/20/19 0509)  . piperacillin-tazobactam (ZOSYN)  IV 3.375 g (12/20/19 1543)     LOS: 4 days      Time spent:40 min    Fahed Morten, Roselind Messier, MD [facility] Pager [phone]  If 7PM-7AM, please contact night-coverage [email or web address] Password [facility] 12/20/2019, 3:54 PM

## 2019-12-20 NOTE — Plan of Care (Signed)

## 2019-12-21 ENCOUNTER — Encounter (HOSPITAL_COMMUNITY)
Admission: EM | Disposition: A | Discharge status: Home / Self Care | Payer: Self-pay | Source: Home / Self Care | Attending: Internal Medicine

## 2019-12-21 ENCOUNTER — Inpatient Hospital Stay (HOSPITAL_COMMUNITY): Payer: Self-pay | Admitting: Certified Registered Nurse Anesthetist

## 2019-12-21 ENCOUNTER — Encounter (HOSPITAL_COMMUNITY): Payer: Self-pay | Admitting: Family Medicine

## 2019-12-21 DIAGNOSIS — D638 Anemia in other chronic diseases classified elsewhere: Secondary | ICD-10-CM

## 2019-12-21 DIAGNOSIS — L03115 Cellulitis of right lower limb: Secondary | ICD-10-CM

## 2019-12-21 DIAGNOSIS — L03116 Cellulitis of left lower limb: Principal | ICD-10-CM

## 2019-12-21 HISTORY — PX: I & D EXTREMITY: SHX5045

## 2019-12-21 LAB — PHOSPHORUS: Phosphorus: 4.1 mg/dL (ref 2.5–4.6)

## 2019-12-21 LAB — COMPREHENSIVE METABOLIC PANEL
ALT: 20 U/L (ref 0–44)
AST: 25 U/L (ref 15–41)
Albumin: 1.8 g/dL — ABNORMAL LOW (ref 3.5–5.0)
Alkaline Phosphatase: 36 U/L — ABNORMAL LOW (ref 38–126)
Anion gap: 9 (ref 5–15)
BUN: 21 mg/dL — ABNORMAL HIGH (ref 6–20)
CO2: 21 mmol/L — ABNORMAL LOW (ref 22–32)
Calcium: 8.1 mg/dL — ABNORMAL LOW (ref 8.9–10.3)
Chloride: 96 mmol/L — ABNORMAL LOW (ref 98–111)
Creatinine, Ser: 2.16 mg/dL — ABNORMAL HIGH (ref 0.61–1.24)
GFR calc Af Amer: 39 mL/min — ABNORMAL LOW (ref >60)
GFR calc non Af Amer: 34 mL/min — ABNORMAL LOW (ref >60)
Glucose, Bld: 95 mg/dL (ref 70–99)
Potassium: 4.5 mmol/L (ref 3.5–5.1)
Sodium: 126 mmol/L — ABNORMAL LOW (ref 135–145)
Total Bilirubin: 0.5 mg/dL (ref 0.3–1.2)
Total Protein: 7 g/dL (ref 6.5–8.1)

## 2019-12-21 LAB — CBC WITH DIFFERENTIAL/PLATELET
Abs Immature Granulocytes: 0.02 10*3/uL (ref 0.00–0.07)
Basophils Absolute: 0.1 10*3/uL (ref 0.0–0.1)
Basophils Relative: 1 %
Eosinophils Absolute: 0.5 10*3/uL (ref 0.0–0.5)
Eosinophils Relative: 7 %
HCT: 33.2 % — ABNORMAL LOW (ref 39.0–52.0)
Hemoglobin: 11.1 g/dL — ABNORMAL LOW (ref 13.0–17.0)
Immature Granulocytes: 0 %
Lymphocytes Relative: 27 %
Lymphs Abs: 1.9 10*3/uL (ref 0.7–4.0)
MCH: 35 pg — ABNORMAL HIGH (ref 26.0–34.0)
MCHC: 33.4 g/dL (ref 30.0–36.0)
MCV: 104.7 fL — ABNORMAL HIGH (ref 80.0–100.0)
Monocytes Absolute: 1.1 10*3/uL — ABNORMAL HIGH (ref 0.1–1.0)
Monocytes Relative: 15 %
Neutro Abs: 3.6 10*3/uL (ref 1.7–7.7)
Neutrophils Relative %: 50 %
Platelets: 263 10*3/uL (ref 150–400)
RBC: 3.17 MIL/uL — ABNORMAL LOW (ref 4.22–5.81)
RDW: 12.2 % (ref 11.5–15.5)
WBC: 7.2 10*3/uL (ref 4.0–10.5)
nRBC: 0 % (ref 0.0–0.2)

## 2019-12-21 LAB — MAGNESIUM: Magnesium: 1.8 mg/dL (ref 1.7–2.4)

## 2019-12-21 LAB — CULTURE, BLOOD (ROUTINE X 2)
Culture: NO GROWTH
Culture: NO GROWTH

## 2019-12-21 LAB — URINE CULTURE: Culture: <10000 — AB

## 2019-12-21 LAB — URIC ACID: Uric Acid, Serum: 4.9 mg/dL (ref 3.7–8.6)

## 2019-12-21 LAB — OSMOLALITY: Osmolality: 274 mOsm/kg — ABNORMAL LOW (ref 275–295)

## 2019-12-21 LAB — NA AND K (SODIUM & POTASSIUM), RAND UR
Potassium Urine: 28 mmol/L
Sodium, Ur: <10 mmol/L

## 2019-12-21 LAB — OSMOLALITY, URINE: Osmolality, Ur: 188 mOsm/kg — ABNORMAL LOW (ref 300–900)

## 2019-12-21 SURGERY — IRRIGATION AND DEBRIDEMENT EXTREMITY
Anesthesia: General | Site: Leg Lower | Laterality: Left

## 2019-12-21 MED ORDER — CEFAZOLIN SODIUM-DEXTROSE 2-4 GM/100ML-% IV SOLN
2.0000 g | Freq: Four times a day (QID) | INTRAVENOUS | Status: AC
  Administered 2019-12-21 – 2019-12-22 (×3): 2 g via INTRAVENOUS
  Filled 2019-12-21 (×3): qty 100

## 2019-12-21 MED ORDER — ACETAMINOPHEN 10 MG/ML IV SOLN: 1000.0000 mg | Freq: Once | INTRAVENOUS | Status: DC | PRN

## 2019-12-21 MED ORDER — CHLORHEXIDINE GLUCONATE 0.12 % MT SOLN
OROMUCOSAL | Status: AC
  Administered 2019-12-21: 15 mL via OROMUCOSAL
  Filled 2019-12-21: qty 15

## 2019-12-21 MED ORDER — MIDAZOLAM HCL 2 MG/2ML IJ SOLN
INTRAMUSCULAR | Status: AC
  Filled 2019-12-21: qty 2

## 2019-12-21 MED ORDER — SODIUM CHLORIDE 0.9 % IV SOLN: INTRAVENOUS | Status: DC

## 2019-12-21 MED ORDER — POVIDONE-IODINE 10 % EX SWAB: 2.0000 "application " | Freq: Once | CUTANEOUS | Status: DC

## 2019-12-21 MED ORDER — HYDROMORPHONE HCL 1 MG/ML IJ SOLN
0.2500 mg | INTRAMUSCULAR | Status: DC | PRN
  Administered 2019-12-21: 0.5 mg via INTRAVENOUS

## 2019-12-21 MED ORDER — PROPOFOL 10 MG/ML IV BOLUS
INTRAVENOUS | Status: AC
  Filled 2019-12-21: qty 20

## 2019-12-21 MED ORDER — HEPARIN SODIUM (PORCINE) 5000 UNIT/ML IJ SOLN
5000.0000 [IU] | Freq: Three times a day (TID) | INTRAMUSCULAR | Status: AC
  Administered 2019-12-21 – 2019-12-22 (×4): 5000 [IU] via SUBCUTANEOUS
  Filled 2019-12-21 (×3): qty 1

## 2019-12-21 MED ORDER — PROPOFOL 10 MG/ML IV BOLUS
INTRAVENOUS | Status: DC | PRN
  Administered 2019-12-21: 200 mg via INTRAVENOUS

## 2019-12-21 MED ORDER — METHOCARBAMOL 500 MG PO TABS
500.0000 mg | ORAL_TABLET | Freq: Four times a day (QID) | ORAL | Status: DC | PRN
  Administered 2019-12-22: 500 mg via ORAL
  Filled 2019-12-21: qty 1

## 2019-12-21 MED ORDER — DOCUSATE SODIUM 100 MG PO CAPS
100.0000 mg | ORAL_CAPSULE | Freq: Two times a day (BID) | ORAL | Status: DC
  Administered 2019-12-21 – 2020-01-04 (×20): 100 mg via ORAL
  Filled 2019-12-21 (×25): qty 1

## 2019-12-21 MED ORDER — LIDOCAINE 2% (20 MG/ML) 5 ML SYRINGE
INTRAMUSCULAR | Status: DC | PRN
  Administered 2019-12-21: 30 mg via INTRAVENOUS

## 2019-12-21 MED ORDER — CHLORHEXIDINE GLUCONATE 0.12 % MT SOLN: 15.0000 mL | Freq: Once | OROMUCOSAL | Status: AC

## 2019-12-21 MED ORDER — ONDANSETRON HCL 4 MG PO TABS: 4.0000 mg | ORAL_TABLET | Freq: Four times a day (QID) | ORAL | Status: DC | PRN

## 2019-12-21 MED ORDER — METOCLOPRAMIDE HCL 5 MG/ML IJ SOLN: 5.0000 mg | Freq: Three times a day (TID) | INTRAMUSCULAR | Status: DC | PRN

## 2019-12-21 MED ORDER — FENTANYL CITRATE (PF) 250 MCG/5ML IJ SOLN
INTRAMUSCULAR | Status: AC
  Filled 2019-12-21: qty 5

## 2019-12-21 MED ORDER — PROMETHAZINE HCL 25 MG/ML IJ SOLN: 6.2500 mg | INTRAMUSCULAR | Status: DC | PRN

## 2019-12-21 MED ORDER — LACTATED RINGERS IV SOLN: INTRAVENOUS | Status: DC

## 2019-12-21 MED ORDER — MEPERIDINE HCL 25 MG/ML IJ SOLN: 6.2500 mg | INTRAMUSCULAR | Status: DC | PRN

## 2019-12-21 MED ORDER — METOCLOPRAMIDE HCL 5 MG PO TABS: 5.0000 mg | ORAL_TABLET | Freq: Three times a day (TID) | ORAL | Status: DC | PRN

## 2019-12-21 MED ORDER — CHLORHEXIDINE GLUCONATE 4 % EX LIQD
60.0000 mL | Freq: Once | CUTANEOUS | Status: AC
  Administered 2019-12-21: 4 via TOPICAL
  Filled 2019-12-21: qty 60

## 2019-12-21 MED ORDER — ONDANSETRON HCL 4 MG/2ML IJ SOLN: 4.0000 mg | Freq: Four times a day (QID) | INTRAMUSCULAR | Status: DC | PRN

## 2019-12-21 MED ORDER — ACETAMINOPHEN 160 MG/5ML PO SOLN: 325.0000 mg | Freq: Once | ORAL | Status: DC | PRN

## 2019-12-21 MED ORDER — METHOCARBAMOL 1000 MG/10ML IJ SOLN
500.0000 mg | Freq: Four times a day (QID) | INTRAVENOUS | Status: DC | PRN
  Filled 2019-12-21: qty 5

## 2019-12-21 MED ORDER — 0.9 % SODIUM CHLORIDE (POUR BTL) OPTIME
TOPICAL | Status: DC | PRN
  Administered 2019-12-21 (×3): 1000 mL

## 2019-12-21 MED ORDER — ACETAMINOPHEN 325 MG PO TABS: 325.0000 mg | ORAL_TABLET | Freq: Once | ORAL | Status: DC | PRN

## 2019-12-21 MED ORDER — OXYCODONE HCL 5 MG PO TABS
5.0000 mg | ORAL_TABLET | ORAL | Status: DC | PRN
  Administered 2019-12-21 – 2019-12-31 (×5): 10 mg via ORAL
  Administered 2019-12-31 – 2020-01-01 (×2): 5 mg via ORAL
  Administered 2020-01-02: 10 mg via ORAL
  Filled 2019-12-21 (×8): qty 2

## 2019-12-21 MED ORDER — HYDROMORPHONE HCL 1 MG/ML IJ SOLN
INTRAMUSCULAR | Status: AC
  Administered 2019-12-21: 0.5 mg via INTRAVENOUS
  Filled 2019-12-21: qty 1

## 2019-12-21 MED ORDER — HYDROMORPHONE HCL 1 MG/ML IJ SOLN: 0.5000 mg | INTRAMUSCULAR | Status: DC | PRN

## 2019-12-21 SURGICAL SUPPLY — 63 items
ALCOHOL 70% 16 OZ (MISCELLANEOUS) ×3 IMPLANT
APL SKNCLS STERI-STRIP NONHPOA (GAUZE/BANDAGES/DRESSINGS) ×1
BENZOIN TINCTURE PRP APPL 2/3 (GAUZE/BANDAGES/DRESSINGS) ×2 IMPLANT
BNDG COHESIVE 4X5 TAN STRL (GAUZE/BANDAGES/DRESSINGS) IMPLANT
BNDG ELASTIC 4X5.8 VLCR STR LF (GAUZE/BANDAGES/DRESSINGS) IMPLANT
BNDG GAUZE ELAST 4 BULKY (GAUZE/BANDAGES/DRESSINGS) IMPLANT
CANISTER WOUND CARE 500ML ATS (WOUND CARE) ×2 IMPLANT
COVER SURGICAL LIGHT HANDLE (MISCELLANEOUS) ×3 IMPLANT
COVER WAND RF STERILE (DRAPES) ×1 IMPLANT
CUFF TOURN SGL QUICK 18X4 (TOURNIQUET CUFF) IMPLANT
CUFF TOURN SGL QUICK 24 (TOURNIQUET CUFF)
CUFF TOURN SGL QUICK 34 (TOURNIQUET CUFF)
CUFF TOURN SGL QUICK 42 (TOURNIQUET CUFF) IMPLANT
CUFF TRNQT CYL 24X4X16.5-23 (TOURNIQUET CUFF) IMPLANT
CUFF TRNQT CYL 34X4.125X (TOURNIQUET CUFF) IMPLANT
DRAPE INCISE IOBAN 66X45 STRL (DRAPES) ×2 IMPLANT
DRAPE U-SHAPE 47X51 STRL (DRAPES) ×1 IMPLANT
DRSG MEPILEX BORDER 4X4 (GAUZE/BANDAGES/DRESSINGS) ×2 IMPLANT
DRSG PAD ABDOMINAL 8X10 ST (GAUZE/BANDAGES/DRESSINGS) IMPLANT
DRSG VAC ATS SM SENSATRAC (GAUZE/BANDAGES/DRESSINGS) ×2 IMPLANT
DURAPREP 26ML APPLICATOR (WOUND CARE) ×1 IMPLANT
ELECT REM PT RETURN 9FT ADLT (ELECTROSURGICAL) ×3
ELECTRODE REM PT RTRN 9FT ADLT (ELECTROSURGICAL) ×1 IMPLANT
GAUZE SPONGE 4X4 12PLY STRL (GAUZE/BANDAGES/DRESSINGS) ×2 IMPLANT
GAUZE XEROFORM 1X8 LF (GAUZE/BANDAGES/DRESSINGS) ×2 IMPLANT
GAUZE XEROFORM 5X9 LF (GAUZE/BANDAGES/DRESSINGS) IMPLANT
GLOVE BIOGEL PI IND STRL 7.0 (GLOVE) ×1 IMPLANT
GLOVE BIOGEL PI IND STRL 8 (GLOVE) ×1 IMPLANT
GLOVE BIOGEL PI INDICATOR 7.0 (GLOVE) ×2
GLOVE BIOGEL PI INDICATOR 8 (GLOVE) ×2
GLOVE ECLIPSE 7.0 STRL STRAW (GLOVE) ×3 IMPLANT
GLOVE ECLIPSE 8.0 STRL XLNG CF (GLOVE) ×3 IMPLANT
GOWN STRL REUS W/ TWL LRG LVL3 (GOWN DISPOSABLE) ×2 IMPLANT
GOWN STRL REUS W/ TWL XL LVL3 (GOWN DISPOSABLE) ×1 IMPLANT
GOWN STRL REUS W/TWL LRG LVL3 (GOWN DISPOSABLE) ×6
GOWN STRL REUS W/TWL XL LVL3 (GOWN DISPOSABLE) ×3
HANDPIECE INTERPULSE COAX TIP (DISPOSABLE)
KIT BASIN OR (CUSTOM PROCEDURE TRAY) ×3 IMPLANT
KIT TURNOVER KIT B (KITS) ×3 IMPLANT
MANIFOLD NEPTUNE II (INSTRUMENTS) ×3 IMPLANT
NS IRRIG 1000ML POUR BTL (IV SOLUTION) ×3 IMPLANT
PACK ORTHO EXTREMITY (CUSTOM PROCEDURE TRAY) ×3 IMPLANT
PAD ARMBOARD 7.5X6 YLW CONV (MISCELLANEOUS) ×6 IMPLANT
PAD CAST 4YDX4 CTTN HI CHSV (CAST SUPPLIES) IMPLANT
PADDING CAST COTTON 4X4 STRL (CAST SUPPLIES)
SET HNDPC FAN SPRY TIP SCT (DISPOSABLE) IMPLANT
SPONGE LAP 18X18 RF (DISPOSABLE) ×2 IMPLANT
SPONGE LAP 4X18 RFD (DISPOSABLE) IMPLANT
STOCKINETTE IMPERVIOUS 9X36 MD (GAUZE/BANDAGES/DRESSINGS) IMPLANT
SUT ETHILON 2 0 FS 18 (SUTURE) IMPLANT
SUT ETHILON 3 0 PS 1 (SUTURE) IMPLANT
SUT VIC AB 3-0 SH 27 (SUTURE)
SUT VIC AB 3-0 SH 27X BRD (SUTURE) IMPLANT
SWAB COLLECTION DEVICE MRSA (MISCELLANEOUS) ×2 IMPLANT
SWAB CULTURE ESWAB REG 1ML (MISCELLANEOUS) IMPLANT
SWAB CULTURE LIQ STUART DBL (MISCELLANEOUS) ×2 IMPLANT
TOWEL GREEN STERILE (TOWEL DISPOSABLE) ×3 IMPLANT
TOWEL GREEN STERILE FF (TOWEL DISPOSABLE) ×3 IMPLANT
TUBE CONNECTING 12'X1/4 (SUCTIONS) ×1
TUBE CONNECTING 12X1/4 (SUCTIONS) ×2 IMPLANT
UNDERPAD 30X36 HEAVY ABSORB (UNDERPADS AND DIAPERS) ×3 IMPLANT
WATER STERILE IRR 1000ML POUR (IV SOLUTION) ×3 IMPLANT
YANKAUER SUCT BULB TIP NO VENT (SUCTIONS) ×3 IMPLANT

## 2019-12-21 NOTE — Plan of Care (Signed)

## 2019-12-21 NOTE — Progress Notes (Signed)
Physical Therapy Treatment Patient Details Name: Robert Frank MRN: [id number] DOB: 1965/07/19 Today's Date: 12/21/2019    History of Present Illness 54 y.o. male with medical history significant of CVA and alcoholism presents to the emergency department for evaluation of right knee injury after a fall and BLE cellulitis.  Patient reports that he is having trouble walking, feels weak all over.    He has had several falls at home over the last few days.  He initially presented for evaluation of right knee pain after a fall yesterday afternoon.  He was admitted for similar complaints last week.  Patient has history of alcoholism.    PT Comments    Pt progressing slowly but steadily towards his physical therapy goals. Continues to report bilateral knee pain at rest and with movement. Still unable to stand from multiple surfaces without significant assist. Ambulating 30 feet with a walker and shuffling gait pattern. Presents as a high fall risk based on decreased gait speed, static/dynamic balance deficits, and history of recurrent falling. In setting of these impairments and decreased caregiver support, continue to recommend SNF for ongoing Physical Therapy.      Follow Up Recommendations  SNF     Equipment Recommendations  Wheelchair (measurements PT);Wheelchair cushion (measurements PT)    Recommendations for Other Services       Precautions / Restrictions Precautions Precautions: Fall Precaution Comments: multiple wounds on buttocks and LEs Restrictions Weight Bearing Restrictions: No    Mobility  Bed Mobility Overal bed mobility: Needs Assistance Bed Mobility: Supine to Sit     Supine to sit: HOB elevated;Min guard     General bed mobility comments: Min guard assist for safety, cues for use of bed rail. Increased time/effort  Transfers Overall transfer level: Needs assistance Equipment used: Rolling walker (2 wheeled) Transfers: Sit to/from Stand Sit to Stand: Mod  assist         General transfer comment: pt requires modA to power up from elevated bed  Ambulation/Gait Ambulation/Gait assistance: Min assist Gait Distance (Feet): 30 Feet Assistive device: Rolling walker (2 wheeled) Gait Pattern/deviations: Step-through pattern;Decreased stride length;Decreased dorsiflexion - right;Decreased dorsiflexion - left;Trunk flexed Gait velocity: reduced Gait velocity interpretation: <1.8 ft/sec, indicate of risk for recurrent falls General Gait Details: Cues for walker proximity, minA for stability. Decreased bilateral foot clearance   Stairs             Wheelchair Mobility    Modified Rankin (Stroke Patients Only)       Balance Overall balance assessment: Needs assistance Sitting-balance support: Feet supported;No upper extremity supported Sitting balance-Leahy Scale: Good     Standing balance support: Bilateral upper extremity supported Standing balance-Leahy Scale: Poor Standing balance comment: reliant on BUE support of RW and minA                            Cognition Arousal/Alertness: Awake/alert Behavior During Therapy: Flat affect Overall Cognitive Status: Impaired/Different from baseline Area of Impairment: Attention;Memory;Safety/judgement;Awareness;Problem solving                   Current Attention Level: Sustained Memory: Decreased recall of precautions Following Commands: Follows one step commands consistently Safety/Judgement: Decreased awareness of safety;Decreased awareness of deficits Awareness: Emergent Problem Solving: Slow processing General Comments: Not oriented to day of week, oriented to month/year      Exercises General Exercises - Lower Extremity Long Arc Quad: Both;10 reps;Seated    General Comments  Pertinent Vitals/Pain Pain Assessment: Faces Faces Pain Scale: Hurts even more Pain Location: bilateral knees Pain Descriptors / Indicators: Grimacing;Guarding Pain  Intervention(s): Limited activity within patient's tolerance;Monitored during session    Home Living   Living Arrangements: Other relatives                  Prior Function            PT Goals (current goals can now be found in the care plan section) Acute Rehab PT Goals Patient Stated Goal: Go home PT Goal Formulation: With patient Time For Goal Achievement: 12/30/19 Potential to Achieve Goals: Fair Progress towards PT goals: Progressing toward goals    Frequency    Min 2X/week      PT Plan Current plan remains appropriate    Co-evaluation              AM-PAC PT "6 Clicks" Mobility   Outcome Measure  Help needed turning from your back to your side while in a flat bed without using bedrails?: None Help needed moving from lying on your back to sitting on the side of a flat bed without using bedrails?: A Little Help needed moving to and from a bed to a chair (including a wheelchair)?: A Little Help needed standing up from a chair using your arms (e.g., wheelchair or bedside chair)?: A Lot Help needed to walk in hospital room?: A Little Help needed climbing 3-5 steps with a railing? : A Lot 6 Click Score: 17    End of Session Equipment Utilized During Treatment: Gait belt Activity Tolerance: Patient tolerated treatment well Patient left: in chair;with call bell/phone within reach;with chair alarm set Nurse Communication: Mobility status PT Visit Diagnosis: Unsteadiness on feet (R26.81);Other abnormalities of gait and mobility (R26.89);History of falling (Z91.81);Pain Pain - part of body: Leg (BLE)     Time: 0277-4128 PT Time Calculation (min) (ACUTE ONLY): 28 min  Charges:  $Gait Training: 8-22 mins $Therapeutic Activity: 8-22 mins                       Lillia Pauls, PT, DPT Acute Rehabilitation Services Pager [phone] Office [phone]    [name] 12/21/2019, 10:14 AM

## 2019-12-21 NOTE — Anesthesia Procedure Notes (Signed)
Procedure Name: LMA Insertion Date/Time: 12/21/2019 5:52 PM Performed by: Gwenyth Allegra, CRNA Pre-anesthesia Checklist: Patient identified, Emergency Drugs available, Suction available and Timeout performed Patient Re-evaluated:Patient Re-evaluated prior to induction Oxygen Delivery Method: Circle system utilized Preoxygenation: Pre-oxygenation with 100% oxygen Induction Type: IV induction LMA: LMA inserted LMA Size: 4.0 Number of attempts: 1 Placement Confirmation: positive ETCO2 and breath sounds checked- equal and bilateral Tube secured with: Tape Dental Injury: Teeth and Oropharynx as per pre-operative assessment

## 2019-12-21 NOTE — Progress Notes (Signed)
   12/21/19 1840  Assess: MEWS Score  BP 103/72  Pulse Rate 80  ECG Heart Rate 81  Resp (!) 29  SpO2 98 %  Assess: MEWS Score  MEWS Temp 0  MEWS Systolic 0  MEWS Pulse 0  MEWS RR 2  MEWS LOC 1  MEWS Score 3  MEWS Score Color Yellow  Assess: if the MEWS score is Yellow or Red  Were vital signs taken at a resting state? No (pt off unit i pacu, will reevaluate VS upon return)    Pt down in PACU, will recheck VS upon arrival and will proceed with MEWs if necesary

## 2019-12-21 NOTE — Plan of Care (Signed)
  Problem: Education: Goal: Knowledge of General Education information will improve Description Including pain rating scale, medication(s)/side effects and non-pharmacologic comfort measures Outcome: Progressing   Problem: Health Behavior/Discharge Planning: Goal: Ability to manage health-related needs will improve Outcome: Progressing   

## 2019-12-21 NOTE — Consult Note (Signed)
ORTHOPAEDIC CONSULTATION  REQUESTING PHYSICIAN: Osvaldo Shipper, MD  Chief Complaint: " My legs hurt"  HPI: Robert Frank is a 54 y.o. male who presents with bilateral lower leg redness and swelling.  He was admitted to the hospital following a fall onto his right knee several days ago.  He complains of right knee pain with pain into his bilateral calves.  He notes this Pain has been present for several weeks.  Pain is getting worse.  He denies any fevers, chills, night sweats.  Does report malaise.  He has a history of alcoholism.  He has been receiving IV antibiotics from the medical team while he has been inpatient and he reports that this is helping with his Symptoms.  Localizes the majority of his pain to the posterior left calf.  He does have a large amount of swelling that appears to be an abscess on the medial aspect of the posterior left calf.  Denies any history of surgery to the left leg.  He does have a history of ACL reconstruction reportedly of the right knee.  Denies any groin pain.  Denies any upper extremity complaints.  He has been ambulating with a walker.  Past Medical History:  Diagnosis Date   Alcoholism (HCC)    Blind left eye    COPD (chronic obstructive pulmonary disease) (HCC)    CVA (cerebral vascular accident) (HCC)    12 years ago- residual R sided numbness   Hypertension    Past Surgical History:  Procedure Laterality Date   ANTERIOR CRUCIATE LIGAMENT REPAIR     Social History   Socioeconomic History   Marital status: Single    Spouse name: Not on file   Number of children: Not on file   Years of education: Not on file   Highest education level: Not on file  Occupational History   Not on file  Tobacco Use   Smoking status: Current Every Day Smoker    Packs/day: 1.00   Smokeless tobacco: Never Used  Substance and Sexual Activity   Alcohol use: Yes    Comment: 5 40 oz/day   Drug use: Not Currently   Sexual activity: Not on file   Other Topics Concern   Not on file  Social History Narrative   Not on file   Social Determinants of Health   Financial Resource Strain:    Difficulty of Paying Living Expenses:   Food Insecurity:    Worried About Programme researcher, broadcasting/film/video in the Last Year:    Barista in the Last Year:   Transportation Needs:    Freight forwarder (Medical):    Lack of Transportation (Non-Medical):   Physical Activity:    Days of Exercise per Week:    Minutes of Exercise per Session:   Stress:    Feeling of Stress :   Social Connections:    Frequency of Communication with Friends and Family:    Frequency of Social Gatherings with Friends and Family:    Attends Religious Services:    Active Member of Clubs or Organizations:    Attends Banker Meetings:    Marital Status:    History reviewed. No pertinent family history. - negative except otherwise stated in the family history section No Known Allergies Prior to Admission medications   Medication Sig Start Date End Date Taking? Authorizing Provider  feeding supplement, ENSURE ENLIVE, (ENSURE ENLIVE) LIQD Take 237 mLs by mouth 3 (three) times daily between meals. 12/12/19  Rolly Salter, MD  folic acid (FOLVITE) 1 MG tablet Take 1 tablet (1 mg total) by mouth daily. 12/13/19   Rolly Salter, MD  gabapentin (NEURONTIN) 100 MG capsule Take 1 capsule (100 mg total) by mouth 2 (two) times daily. 12/12/19   Rolly Salter, MD  lactulose (CHRONULAC) 10 GM/15ML solution Take 30 mLs (20 g total) by mouth daily. 12/13/19   Rolly Salter, MD  Multiple Vitamin (MULTIVITAMIN WITH MINERALS) TABS tablet Take 1 tablet by mouth daily. 12/13/19   Rolly Salter, MD  naproxen sodium (ALEVE) 220 MG tablet Take 220 mg by mouth 2 (two) times daily as needed (knee pain).    [provider]  nicotine (NICODERM CQ - DOSED IN MG/24 HOURS) 14 mg/24hr patch Place 1 patch (14 mg total) onto the skin daily. 12/13/19   Rolly Salter,  MD  nutrition supplement, Heinz Knuckles, Heinz Knuckles) PACK Take 1 packet by mouth 2 (two) times daily between meals. 12/12/19   Rolly Salter, MD  thiamine 100 MG tablet Take 1 tablet (100 mg total) by mouth daily. 12/13/19   Rolly Salter, MD   MR TIBIA FIBULA RIGHT WO CONTRAST  Result Date: 12/20/2019 CLINICAL DATA:  Bilateral lower extremity swelling and pain EXAM: MRI OF LOWER RIGHT EXTREMITY WITHOUT CONTRAST TECHNIQUE: Multiplanar, multisequence MR imaging of the right was performed. No intravenous contrast was administered. COMPARISON:  None. FINDINGS: Bones/Joint/Cartilage Normal osseous marrow signal is seen throughout. No osseous fracture, marrow edema, or pathologic marrow infiltration. The joint spaces appear to be maintained. Ligaments Suboptimally visualized Muscles and Tendons Mild diffuse fatty atrophy with increased feathery signal seen throughout the muscles surrounding the lower extremity. The visualized portions of the tendons are intact. No evidence of tendinopathy or tear. Soft tissues There is diffuse subcutaneous edema and skin thickening seen surrounding the lower extremity. No loculated fluid collections or areas of ulceration is seen. IMPRESSION: Findings suggestive of diffuse cellulitis involving the lower extremity. No evidence abscess or osteomyelitis. Electronically Signed   By: Jonna Clark M.D.   On: 12/20/2019 20:31   MR TIBIA FIBULA LEFT WO CONTRAST  Result Date: 12/20/2019 CLINICAL DATA:  Swelling in the bilateral legs most notable in the left lower extremity, rule out abscess EXAM: MRI OF LOWER LEFT EXTREMITY WITHOUT CONTRAST TECHNIQUE: Multiplanar, multisequence MR imaging of the left was performed. No intravenous contrast was administered. COMPARISON:  None. FINDINGS: Bones/Joint/Cartilage Normal osseous marrow signal is seen throughout. No osseous fracture, marrow edema, or pathologic marrow infiltration. The joint spaces appear to be maintained. Ligaments Suboptimally visualized  Muscles and Tendons Mild diffuse fatty atrophy with increased feathery signal seen throughout the muscles surrounding the lower extremity. The visualized portions of the tendons are intact. No evidence of tendinopathy or tear. Soft tissues There is diffuse subcutaneous edema and skin thickening seen surrounding the lower extremity. Overlying the posterior medial proximal lower extremity there is a multilocular collection measuring 4.9 x 1.0 by 6.8 cm which abuts the deep fascial layer overlying the medial gastrocnemius. IMPRESSION: Findings suggestive of diffuse cellulitis of the lower extremity with a multilocular fluid collection within the subcutaneous soft tissues overlying the medial head of the gastrocnemius measuring 4.9 x 1.0 x 6.9 cm which could represent a phlegmon/early abscess. No evidence of osteomyelitis. Electronically Signed   By: Jonna Clark M.D.   On: 12/20/2019 20:30   - pertinent xrays, CT, MRI studies were reviewed and independently interpreted  Positive ROS: All other systems have been reviewed  and were otherwise negative with the exception of those mentioned in the HPI and as above.  Physical Exam: General: Alert, no acute distress Psychiatric: Patient is competent for consent with normal mood and affect Lymphatic: No axillary or cervical lymphadenopathy Cardiovascular: No pedal edema Respiratory: No cyanosis, no use of accessory musculature GI: No organomegaly, abdomen is soft and non-tender    Images:  @ENCIMAGES @  Labs:  Lab Results  Component Value Date   HGBA1C 5.1 10/07/2018   ESRSEDRATE 72 (H) 12/07/2019   CRP 9.6 (H) 12/07/2019   CRP <0.8 10/06/2018   LABURIC 4.9 12/21/2019   REPTSTATUS 12/21/2019 FINAL 12/19/2019   CULT (A) 12/19/2019    <10,000 COLONIES/mL INSIGNIFICANT GROWTH Performed at [facility], [address] [place], [place]    [address] ENTEROCOCCUS FAECALIS (A) 12/05/2019    Lab Results  Component Value Date   ALBUMIN  1.8 (L) 12/21/2019   ALBUMIN 1.9 (L) 12/20/2019   ALBUMIN 1.9 (L) 12/19/2019   LABURIC 4.9 12/21/2019    Neurologic: Patient does not have protective sensation bilateral lower extremities.   MUSCULOSKELETAL:   Diffuse erythema throughout the bilateral lower legs, worse in the left leg.  Focal area of fluctuance over the medial left posterior calf.  Tenderness overlying this area of fluctuance as well as diffusely through the bilateral calves..  Multiple abrasions are noted throughout the bilateral legs.  Positive knee effusion of the right knee.  Negative knee effusion of the left knee.  No pain with passive range of motion of the bilateral knees.  No pain out of proportion when evaluating knees.  No pain with logroll of bilateral lower extremities.  No groin pain elicited.  No pain with passive range of motion of the bilateral ankles.  No tenderness to palpation throughout the bilateral upper extremities.  No pain with range of motion of the bilateral fingers, wrist, elbow, shoulder.  Assessment: Bilateral lower leg cellulitis with medial left calf abscess  Plan: Plan to post patient for left calf abscess I&D later today.  N.p.o.  Last meal was 9 or 930 this morning.  Last dose of Lovenox was 10 AM yesterday.  Lovenox is held today.  Plan to take cultures in the OR but these may be negative due to IV antibiotics that are administered today.  Discussed patient with Dr. 12/23/2019.  Thank you for the consult and the opportunity to see Robert Frank, Robert Frank [facility] [phone] 11:09 AM

## 2019-12-21 NOTE — Anesthesia Postprocedure Evaluation (Signed)
Anesthesia Post Note  Patient: Robert Frank  Procedure(s) Performed: IRRIGATION AND DEBRIDEMENT LEFT CALF ABSCESS (Left Leg Lower)     Patient location during evaluation: PACU Anesthesia Type: General Level of consciousness: awake and alert Pain management: pain level controlled Vital Signs Assessment: post-procedure vital signs reviewed and stable Respiratory status: spontaneous breathing, nonlabored ventilation, respiratory function stable and patient connected to nasal cannula oxygen Cardiovascular status: blood pressure returned to baseline and stable Postop Assessment: no apparent nausea or vomiting Anesthetic complications: no   No complications documented.  Last Vitals:  Vitals:   12/21/19 1855 12/21/19 1925  BP: 105/84 100/76  Pulse: 77 74  Resp: (!) 29 (!) 30  Temp:  36.7 C  SpO2: 98% 98%    Last Pain:  Vitals:   12/21/19 1910  TempSrc:   PainSc: 5                  Cecile Hearing

## 2019-12-21 NOTE — Anesthesia Preprocedure Evaluation (Addendum)
Anesthesia Evaluation  Patient identified by MRN, date of birth, ID band Patient awake    Reviewed: Allergy & Precautions, NPO status , Patient's Chart, lab work & pertinent test results  Airway Mallampati: III  TM Distance: >3 FB Neck ROM: Full    Dental  (+) Poor Dentition, Missing, Chipped,    Pulmonary COPD, Current Smoker,     + decreased breath sounds      Cardiovascular hypertension,  Rhythm:Regular Rate:Normal     Neuro/Psych PSYCHIATRIC DISORDERS CVA    GI/Hepatic negative GI ROS, Neg liver ROS,   Endo/Other  negative endocrine ROS  Renal/GU Renal InsufficiencyRenal disease     Musculoskeletal negative musculoskeletal ROS (+)   Abdominal (+) + obese,   Peds  Hematology negative hematology ROS (+)   Anesthesia Other Findings   Reproductive/Obstetrics                            Anesthesia Physical Anesthesia Plan  ASA: III  Anesthesia Plan: General   Post-op Pain Management:    Induction: Intravenous  PONV Risk Score and Plan: 2 and Ondansetron and Midazolam  Airway Management Planned: Oral ETT  Additional Equipment: None  Intra-op Plan:   Post-operative Plan: Extubation in OR  Informed Consent: I have reviewed the patients History and Physical, chart, labs and discussed the procedure including the risks, benefits and alternatives for the proposed anesthesia with the patient or authorized representative who has indicated his/her understanding and acceptance.     Dental advisory given  Plan Discussed with: CRNA  Anesthesia Plan Comments:        Anesthesia Quick Evaluation

## 2019-12-21 NOTE — Progress Notes (Signed)
Patient transferred to PACU.  Wound Vac applied at end of procedure.  Recommend subcutaneous heparin q8h for DVT prophylaxis for 4 doses which will stop in time for planned follow-up I&D procedure with Dr. Lajoyce Corners on Friday.

## 2019-12-21 NOTE — Brief Op Note (Addendum)
   12/21/2019  9:29 PM  PATIENT:  Marnee Spring  54 y.o. male  PRE-OPERATIVE DIAGNOSIS:  calf abscess  POST-OPERATIVE DIAGNOSIS:  calf abscess  PROCEDURE:  Procedure(s): IRRIGATION AND DEBRIDEMENT LEFT CALF ABSCESS  SURGEON:  Surgeon(s): August Saucer Corrie Mckusick, MD  ASSISTANT: magnant pa  ANESTHESIA:   general  EBL: 12 ml    No intake/output data recorded.  BLOOD ADMINISTERED: none  DRAINS: wound vac in calf  LOCAL MEDICATIONS USED:  none  SPECIMEN:  No Specimen  COUNTS:  YES  TOURNIQUET:  * No tourniquets in log *  DICTATION: .Other Dictation: Dictation Number [id number]  PLAN OF CARE: Admit to inpatient   PATIENT DISPOSITION:  PACU - hemodynamically stable

## 2019-12-21 NOTE — Transfer of Care (Signed)
Immediate Anesthesia Transfer of Care Note  Patient: Robert Frank  Procedure(s) Performed: IRRIGATION AND DEBRIDEMENT LEFT CALF ABSCESS (Left Leg Lower)  Patient Location: PACU  Anesthesia Type:General  Level of Consciousness: awake and alert   Airway & Oxygen Therapy: Patient Spontanous Breathing  Post-op Assessment: Report given to RN and Post -op Vital signs reviewed and stable  Post vital signs: Reviewed and stable  Last Vitals:  Vitals Value Taken Time  BP    Temp    Pulse 81 12/21/19 1838  Resp 30 12/21/19 1838  SpO2 99 % 12/21/19 1838  Vitals shown include unvalidated device data.  Last Pain:  Vitals:   12/21/19 1534  TempSrc: Oral  PainSc:          Complications: No complications documented.

## 2019-12-21 NOTE — Progress Notes (Signed)
PROGRESS NOTE    Robert Frank  QJF:[id number] DOB: [birth year] DOA: 12/15/2019 PCP: Patient, No Pcp Per     Brief Narrative:   54 y.o. WM PMHx  CVA and EtOH abuse who presented to the emergency department for evaluation of right knee injury after a fall. Patient has history of alcoholism.  He reportedly lives in a home that is extremely dirty and run down and home health will not go see it due to safety reasons reportedly.  He states he lives with his brother.  Reports he has been feeling very weak and after his fall earlier today he could not get up by himself and his brother helped him get up in the bed.  He has not been able to get up and ambulate or do any activities around the house.  He does have chronic back pain which still bothers him but is not worse.  He reports he has not drink any alcohol in a week.  He had hyponatremia last week when he was admitted.  Hyponatremia is most likely secondary to his alcoholism.  Reviewing his chart he has chronic hyponatremia.  He states he has not drink after his last discharge from the hospital.  He is oriented to person place and the month but he is unsure of the day of the week.  He is slow to respond and is a poor historian.  He does continue to smoke.  He denies having any fever.  He denies any burning with urination and denies any cough or shortness of breath.  He denies any loss of consciousness.  ED Course: In the emergency room patient is found to have bilateral lower extremity erythema and swelling.  When compared to last week when he was admitted from the pictures in the chart his erythema and swelling is worse.  He again has hyponatremia with a sodium of 122.  Hospital service been asked to admit for further management   Subjective: Patient continues to complain of pain in both legs left more than right.  Denies any chest pain or shortness of breath.       Assessment & Plan:  Bilateral lower extremity cellulitis with abscess in the  left calf area Patient was initially started on vancomycin and Zosyn.  However due to worsening renal function and was changed over to linezolid.  MRI was obtained yesterday which does show evidence for cellulitis but also shows an abscess in the left lower leg.  Discussed with Dr. August Saucer with orthopedic surgery who will consult.  Patient would likely need I&D.    Hyponatremia Most likely due to alcohol abuse and excessive fluid intake.  Sodium level noted to be lower today compared to yesterday.  Serum osmolality 274.  Urine osmolality 188.  Urine sodium less than 10.  Urine potassium 28.  Will fluid restrict.  Acute kidney injury Baseline creatinine is normal.  For the past 3 days his creatinine has been climbing.  Creatinine is up to 2.16.  Likely due to vancomycin.  This has been discontinued.  He does have good urine output.  Avoid nephrotoxic agents.  Increase rate of IV fluids.  Monitor urine output.  Recheck labs tomorrow.  Echocardiogram showed normal systolic function.  Lower extremity edema is most likely due to cellulitis.  He could also have chronic venous insufficiency.  History of COPD Respiratory status is stable.  Continue nebulizer treatments.  Essential hypertension Monitor blood pressures closely.  Lisinopril was held.  He is on metoprolol and hydralazine.  Alcohol abuse No evidence for withdrawal currently.  CIWA protocol.  Anemia of chronic disease Anemia panel reviewed shows ferritin 754, TIBC 182, B12 301, folate 17.8.  Hemoglobin is stable.  No evidence of overt bleeding.  DVT prophylaxis: Lovenox.  The Lovenox will be held for surgery this afternoon. Code Status: Full code Family Communication: Discussed with the patient  Status is: Inpatient  Remains inpatient appropriate because:IV treatments appropriate due to intensity of illness or inability to take PO   Dispo: The patient is from: Home              Anticipated d/c is to: To be determined               Anticipated d/c date is: 3 days              Patient currently is not medically stable to d/c.    Consultants: Dr. August Saucerean with orthopedic surgery   Procedures/Significant Events:  7/10 Echocardiogram;Left Ventricle: LVEF=60 to 65%.  -Left ventricular diastolic parameters were normal.     Cultures 7/9 blood AC NGTD 7/9 blood NGTD 7/12 urine pending    Antimicrobials: Anti-infectives (From admission, onward)   Start     Ordered Stop   12/19/19 1800  linezolid (ZYVOX) IVPB 600 mg     Discontinue     12/19/19 1746     12/16/19 1700  vancomycin (VANCOCIN) IVPB 1000 mg/200 mL premix  Status:  Discontinued        12/16/19 0628 12/19/19 1746   12/16/19 1400  piperacillin-tazobactam (ZOSYN) IVPB 3.375 g  Status:  Discontinued        12/16/19 0628 12/20/19 1602   12/16/19 0630  piperacillin-tazobactam (ZOSYN) IVPB 3.375 g        12/16/19 0628 12/16/19 0715   12/16/19 0345  vancomycin (VANCOREADY) IVPB 2000 mg/400 mL        12/16/19 0342 12/16/19 0642       Continuous Infusions:  sodium chloride 75 mL/hr at 12/20/19 1542   linezolid (ZYVOX) IV Stopped (12/21/19 1326)     Objective: Vitals:   12/20/19 0756 12/20/19 1510 12/21/19 0348 12/21/19 0757  BP: (!) 161/98 (!) 155/101 135/79 111/74  Pulse: 85 85 85 97  Resp: 14 17 18 18   Temp: 97.6 F (36.4 C) 97.9 F (36.6 C) 98 F (36.7 C) 98.7 F (37.1 C)  TempSrc: Oral Oral Oral   SpO2: 98% 100% 98% 97%  Weight:      Height:        Intake/Output Summary (Last 24 hours) at 12/21/2019 1358 Last data filed at 12/21/2019 1100 Gross per 24 hour  Intake 1920 ml  Output 1600 ml  Net 320 ml   Filed Weights   12/16/19 1154  Weight: 101.5 kg     Physical Exam:  General appearance: Awake alert.  In no distress Resp: Clear to auscultation bilaterally.  Normal effort Cardio: S1-S2 is normal regular.  No S3-S4.  No rubs murmurs or bruit GI: Abdomen is soft.  Nontender nondistended.  Bowel sounds are present normal.  No  masses organomegaly Extremities: Edema noted bilateral lower extremities.  Good peripheral pulses.  Chronic skin changes noted.  Tender to palpate especially in the left lower leg towards the calf area.   Neurologic: Alert and oriented x3.  No focal neurological deficits.     Lab Data Reviewed:  CBC: Recent Labs  Lab 12/17/19 1117 12/18/19 0251 12/19/19 0242 12/20/19 0418 12/21/19 0207  WBC 5.1 6.2 6.8 7.7  7.2  NEUTROABS 2.4 3.2 3.4 4.4 3.6  HGB 11.3* 11.3* 11.5* 11.8* 11.1*  HCT 34.6* 33.8* 34.7* 35.4* 33.2*  MCV 104.8* 103.7* 103.6* 104.4* 104.7*  PLT 300 300 276 277 263   Basic Metabolic Panel: Recent Labs  Lab 12/17/19 0506 12/17/19 0506 12/18/19 0251 12/18/19 0251 12/19/19 0242 12/19/19 1249 12/19/19 1613 12/20/19 0418 12/21/19 0207  NA 130*   < > 131*  --  129* 131*  --  129* 126*  K 3.8   < > 3.8  --  4.2 4.2  --  4.4 4.5  CL 100   < > 100  --  99 99  --  99 96*  CO2 24   < > 23  --  23 26  --  22 21*  GLUCOSE 110*   < > 102*  --  96 102*  --  101* 95  BUN 12   < > 12  --  13 14  --  19 21*  CREATININE 0.64   < > 0.85   < > 1.31* 1.55* 1.50* 1.90* 2.16*  CALCIUM 8.3*   < > 8.4*  --  8.2* 8.2*  --  8.0* 8.1*  MG 1.8  --  1.8  --  1.9  --   --  1.8 1.8  PHOS 4.3  --  4.2  --  4.7*  --   --  4.8* 4.1   < > = values in this interval not displayed.   GFR: Estimated Creatinine Clearance: 45.4 mL/min (A) (by C-G formula based on SCr of 2.16 mg/dL (H)). Liver Function Tests: Recent Labs  Lab 12/18/19 0251 12/19/19 0242 12/20/19 0418 12/21/19 0207  AST 45* 37 29 25  ALT 41 ALKPHOS 37* 39 39 36*  BILITOT 0.6 0.8 0.2* 0.5  PROT 7.4 7.2 7.1 7.0  ALBUMIN 1.9* 1.9* 1.9* 1.8*   Sepsis Labs: Recent Labs  Lab 12/16/19 0151  LATICACIDVEN 1.7    Recent Results (from the past 240 hour(s))  Culture, blood (Routine X 2) w Reflex to ID Panel     Status: None   Collection Time: 12/16/19  1:43 AM   Specimen: BLOOD  Result Value Ref Range Status    Specimen Description BLOOD LEFT ANTECUBITAL  Final   Special Requests   Final    BOTTLES DRAWN AEROBIC AND ANAEROBIC Blood Culture results may not be optimal due to an inadequate volume of blood received in culture bottles   Culture   Final    NO GROWTH 5 DAYS Performed at [facility], [address] [place], [place] [address]    Report Status 12/21/2019 FINAL  Final  Culture, blood (Routine X 2) w Reflex to ID Panel     Status: None   Collection Time: 12/16/19  2:39 AM   Specimen: BLOOD  Result Value Ref Range Status   Specimen Description BLOOD SITE NOT SPECIFIED  Final   Special Requests   Final    BOTTLES DRAWN AEROBIC AND ANAEROBIC Blood Culture results may not be optimal due to an inadequate volume of blood received in culture bottles   Culture   Final    NO GROWTH 5 DAYS Performed at [facility], [address], [place], [place] [address]    Report Status 12/21/2019 FINAL  Final  SARS Coronavirus 2 by RT PCR (hospital order, performed in [facility] hospital lab) Nasopharyngeal Nasopharyngeal Swab     Status: None  Collection Time: 12/16/19  3:48 AM   Specimen: Nasopharyngeal Swab  Result Value Ref Range Status   SARS Coronavirus 2 NEGATIVE NEGATIVE Final    Comment: (NOTE) SARS-CoV-2 target nucleic acids are NOT DETECTED.  The SARS-CoV-2 RNA is generally detectable in upper and lower respiratory specimens during the acute phase of infection. The lowest concentration of SARS-CoV-2 viral copies this assay can detect is 250 copies / mL. A negative result does not preclude SARS-CoV-2 infection and should not be used as the sole basis for treatment or other patient management decisions.  A negative result may occur with improper specimen collection / handling, submission of specimen other than nasopharyngeal swab, presence of viral mutation(s) within the areas targeted by this assay, and inadequate number of viral copies (<250 copies / mL). A negative result  must be combined with clinical observations, patient history, and epidemiological information.  Fact Sheet for Patients:   BoilerBrush.com.cy  Fact Sheet for Healthcare Providers: https://pope.com/  This test is not yet approved or  cleared by the [place] FDA and has been authorized for detection and/or diagnosis of SARS-CoV-2 by FDA under an Emergency Use Authorization (EUA).  This EUA will remain in effect (meaning this test can be used) for the duration of the COVID-19 declaration under Section 564(b)(1) of the Act, 21 U.S.C. section 360bbb-3(b)(1), unless the authorization is terminated or revoked sooner.  Performed at [facility], [address], [place], [place] [address]   Culture, Urine     Status: Abnormal   Collection Time: 12/19/19  3:55 PM   Specimen: Urine, Clean Catch  Result Value Ref Range Status   Specimen Description URINE, CLEAN CATCH  Final   Special Requests NONE  Final   Culture (A)  Final    <10,000 COLONIES/mL INSIGNIFICANT GROWTH Performed at [facility], [address] [place], [place] [address]    Report Status 12/21/2019 FINAL  Final         Radiology Studies: MR TIBIA FIBULA RIGHT WO CONTRAST  Result Date: 12/20/2019 CLINICAL DATA:  Bilateral lower extremity swelling and pain EXAM: MRI OF LOWER RIGHT EXTREMITY WITHOUT CONTRAST TECHNIQUE: Multiplanar, multisequence MR imaging of the right was performed. No intravenous contrast was administered. COMPARISON:  None. FINDINGS: Bones/Joint/Cartilage Normal osseous marrow signal is seen throughout. No osseous fracture, marrow edema, or pathologic marrow infiltration. The joint spaces appear to be maintained. Ligaments Suboptimally visualized Muscles and Tendons Mild diffuse fatty atrophy with increased feathery signal seen throughout the muscles surrounding the lower extremity. The visualized portions of the tendons are intact. No  evidence of tendinopathy or tear. Soft tissues There is diffuse subcutaneous edema and skin thickening seen surrounding the lower extremity. No loculated fluid collections or areas of ulceration is seen. IMPRESSION: Findings suggestive of diffuse cellulitis involving the lower extremity. No evidence abscess or osteomyelitis. Electronically Signed   By: Jonna Clark M.D.   On: 12/20/2019 20:31   MR TIBIA FIBULA LEFT WO CONTRAST  Result Date: 12/20/2019 CLINICAL DATA:  Swelling in the bilateral legs most notable in the left lower extremity, rule out abscess EXAM: MRI OF LOWER LEFT EXTREMITY WITHOUT CONTRAST TECHNIQUE: Multiplanar, multisequence MR imaging of the left was performed. No intravenous contrast was administered. COMPARISON:  None. FINDINGS: Bones/Joint/Cartilage Normal osseous marrow signal is seen throughout. No osseous fracture, marrow edema, or pathologic marrow infiltration. The joint spaces appear to be maintained. Ligaments Suboptimally visualized Muscles and Tendons Mild diffuse fatty atrophy with increased feathery signal seen throughout  the muscles surrounding the lower extremity. The visualized portions of the tendons are intact. No evidence of tendinopathy or tear. Soft tissues There is diffuse subcutaneous edema and skin thickening seen surrounding the lower extremity. Overlying the posterior medial proximal lower extremity there is a multilocular collection measuring 4.9 x 1.0 by 6.8 cm which abuts the deep fascial layer overlying the medial gastrocnemius. IMPRESSION: Findings suggestive of diffuse cellulitis of the lower extremity with a multilocular fluid collection within the subcutaneous soft tissues overlying the medial head of the gastrocnemius measuring 4.9 x 1.0 x 6.9 cm which could represent a phlegmon/early abscess. No evidence of osteomyelitis. Electronically Signed   By: Jonna Clark M.D.   On: 12/20/2019 20:30        Scheduled Meds:  aspirin EC  81 mg Oral Daily    folic acid  1 mg Oral Daily   gabapentin  100 mg Oral BID   hydrALAZINE  10 mg Intravenous Q6H   LORazepam  1-2 mg Intravenous Once   metoprolol tartrate  50 mg Oral BID   multivitamin with minerals  1 tablet Oral Daily   povidone-iodine  2 application Topical Once   thiamine  100 mg Oral Daily   Continuous Infusions:  sodium chloride 75 mL/hr at 12/20/19 1542   linezolid (ZYVOX) IV Stopped (12/21/19 1326)     LOS: 5 days     Osvaldo Shipper, MD [facility]   If 7PM-7AM, please contact night-coverage [email or web address] Password [facility] 12/21/2019, 1:58 PM

## 2019-12-22 ENCOUNTER — Inpatient Hospital Stay (HOSPITAL_COMMUNITY): Payer: Self-pay

## 2019-12-22 ENCOUNTER — Encounter (HOSPITAL_COMMUNITY): Payer: Self-pay | Admitting: Orthopedic Surgery

## 2019-12-22 LAB — CBC WITH DIFFERENTIAL/PLATELET
Abs Immature Granulocytes: 0.03 10*3/uL (ref 0.00–0.07)
Basophils Absolute: 0.1 10*3/uL (ref 0.0–0.1)
Basophils Relative: 1 %
Eosinophils Absolute: 0.6 10*3/uL — ABNORMAL HIGH (ref 0.0–0.5)
Eosinophils Relative: 7 %
HCT: 34 % — ABNORMAL LOW (ref 39.0–52.0)
Hemoglobin: 11.2 g/dL — ABNORMAL LOW (ref 13.0–17.0)
Immature Granulocytes: 0 %
Lymphocytes Relative: 25 %
Lymphs Abs: 1.9 10*3/uL (ref 0.7–4.0)
MCH: 35.2 pg — ABNORMAL HIGH (ref 26.0–34.0)
MCHC: 32.9 g/dL (ref 30.0–36.0)
MCV: 106.9 fL — ABNORMAL HIGH (ref 80.0–100.0)
Monocytes Absolute: 1.2 10*3/uL — ABNORMAL HIGH (ref 0.1–1.0)
Monocytes Relative: 15 %
Neutro Abs: 4 10*3/uL (ref 1.7–7.7)
Neutrophils Relative %: 52 %
Platelets: 287 10*3/uL (ref 150–400)
RBC: 3.18 MIL/uL — ABNORMAL LOW (ref 4.22–5.81)
RDW: 12.5 % (ref 11.5–15.5)
WBC: 7.8 10*3/uL (ref 4.0–10.5)
nRBC: 0 % (ref 0.0–0.2)

## 2019-12-22 LAB — COMPREHENSIVE METABOLIC PANEL
ALT: 18 U/L (ref 0–44)
AST: 30 U/L (ref 15–41)
Albumin: 1.9 g/dL — ABNORMAL LOW (ref 3.5–5.0)
Alkaline Phosphatase: 38 U/L (ref 38–126)
Anion gap: 6 (ref 5–15)
BUN: 27 mg/dL — ABNORMAL HIGH (ref 6–20)
CO2: 24 mmol/L (ref 22–32)
Calcium: 8.2 mg/dL — ABNORMAL LOW (ref 8.9–10.3)
Chloride: 96 mmol/L — ABNORMAL LOW (ref 98–111)
Creatinine, Ser: 2.8 mg/dL — ABNORMAL HIGH (ref 0.61–1.24)
GFR calc Af Amer: 29 mL/min — ABNORMAL LOW (ref >60)
GFR calc non Af Amer: 25 mL/min — ABNORMAL LOW (ref >60)
Glucose, Bld: 94 mg/dL (ref 70–99)
Potassium: 5.1 mmol/L (ref 3.5–5.1)
Sodium: 126 mmol/L — ABNORMAL LOW (ref 135–145)
Total Bilirubin: 0.5 mg/dL (ref 0.3–1.2)
Total Protein: 6.8 g/dL (ref 6.5–8.1)

## 2019-12-22 LAB — PHOSPHORUS: Phosphorus: 5 mg/dL — ABNORMAL HIGH (ref 2.5–4.6)

## 2019-12-22 LAB — MAGNESIUM: Magnesium: 1.9 mg/dL (ref 1.7–2.4)

## 2019-12-22 MED ORDER — SODIUM CHLORIDE 0.9 % IV BOLUS
500.0000 mL | Freq: Once | INTRAVENOUS | Status: AC
  Administered 2019-12-22: 500 mL via INTRAVENOUS

## 2019-12-22 MED ORDER — HYDRALAZINE HCL 20 MG/ML IJ SOLN
10.0000 mg | Freq: Four times a day (QID) | INTRAMUSCULAR | Status: DC | PRN
  Administered 2019-12-24 – 2020-01-01 (×2): 10 mg via INTRAVENOUS
  Filled 2019-12-22 (×2): qty 1

## 2019-12-22 NOTE — Progress Notes (Signed)
This nurse informed August Saucer, MD that wound vac was accidentally yanked from seal by patient when transferring from commode to bed; this nurse attempted to reinforced, but seal would not stay. This nurse received the okay to remove the wound vac to apply a moist dressing and wrap with ace bandage for the time being. Pt will be going to surgery in the morning; this was reported to the PA., pt tolerated dressing change well

## 2019-12-22 NOTE — Op Note (Signed)
NAME: Rochefort, CARLESTER KASPAREK MEDICAL RECORD [id number] ACCOUNT [id number] DATE OF BIRTH:1965/07/16 FACILITY: [facility] LOCATION: [id number] PHYSICIAN:Vayla Wilhelmi Diamantina Providence, MD  OPERATIVE REPORT  DATE OF PROCEDURE:  12/21/2019  PREOPERATIVE DIAGNOSIS:  Left calf abscess.  POSTOPERATIVE DIAGNOSIS:  Left calf abscess.  PROCEDURE:  I and D left calf abscess with application of wound VAC.  SURGEON:  Cammy Copa, MD  ASSISTANT:  Karenann Cai, PA  INDICATIONS:  Amaziah is a 54 year old patient with left calf abscess.  He presents for operative management after explanation of risks and benefits.  PROCEDURE IN DETAIL:  The patient was brought to the operating room where general anesthetic was induced.  Perioperative IV antibiotics were maintained.  Left leg was prepped with CHG and draped in a sterile manner.  Timeout was called.  The patient did  have a mildly fluctuant protuberant mass on the posterior aspect of his medial left calf, mid tibial region.  8 cm incision was made.  Skin and subcutaneous tissue were sharply divided.  Abscess cavity was encountered, which was above the fascia.  The  fascia itself was not involved.  Specimen was sent for culture and analysis.  Excisional debridement was then performed of skin and subcutaneous tissue, muscle, fascia, but not bone.  Following excisional debridement with a curet, thorough irrigation was  performed with 3 liters of irrigating solution.  Wound VAC was then placed and suction applied.  Good seal was obtained.    Plan for patient return to the OR in 2 days for [facility] removal and delayed primary closure.  The patient tolerated the procedure well without immediate complication.  Luke's assistance was required at all times for opening and closing, application of wound  VAC, and patient positioning.  His assistance was a medical necessity.  CN/NUANCE  D:12/21/2019 T:12/22/2019 JOB:[id number]

## 2019-12-22 NOTE — Progress Notes (Signed)
PROGRESS NOTE    Robert Frank  UXL:[id number] DOB: 13-Dec-1965 DOA: 12/15/2019 PCP: Patient, No Pcp Per     Brief Narrative:   55 y.o. WM PMHx  CVA and EtOH abuse who presented to the emergency department for evaluation of right knee injury after a fall. Patient has history of alcoholism.  He reportedly lives in a home that is extremely dirty and run down and home health will not go see it due to safety reasons reportedly.  He states he lives with his brother.  Reports he has been feeling very weak and after his fall earlier today he could not get up by himself and his brother helped him get up in the bed.  He has not been able to get up and ambulate or do any activities around the house.  He does have chronic back pain which still bothers him but is not worse.  He reports he has not drink any alcohol in a week.  He had hyponatremia last week when he was admitted.  Hyponatremia is most likely secondary to his alcoholism.  Reviewing his chart he has chronic hyponatremia.  He states he has not drink after his last discharge from the hospital.  He is oriented to person place and the month but he is unsure of the day of the week.  He is slow to respond and is a poor historian.  He does continue to smoke.  He denies having any fever.  He denies any burning with urination and denies any cough or shortness of breath.  He denies any loss of consciousness.  ED Course: In the emergency room patient is found to have bilateral lower extremity erythema and swelling.  When compared to last week when he was admitted from the pictures in the chart his erythema and swelling is worse.  He again has hyponatremia with a sodium of 122.  Hospital service been asked to admit for further management   Subjective: Patient states that surgery went well.  Continues to have pain in the legs, left greater than right.  No shortness of breath.  Has been passing urine.  Has been having bowel movements.     Assessment &  Plan:  Bilateral lower extremity cellulitis with abscess in the left calf area Patient was initially started on vancomycin and Zosyn.  However due to worsening renal function and was changed over to linezolid.  MRI was obtained which showed evidence for cellulitis but also shows an abscess in the left lower leg.  Discussed with Dr. August Saucer with orthopedic surgery.  Patient was taken to the OR yesterday for I&D.  Wound VAC is in place.  Plan is for repeat I&D on Friday.  Continue linezolid.  Pain control. Lower extremity edema is most likely due to cellulitis.  He could also have chronic venous insufficiency.  Hyponatremia Most likely due to alcohol abuse and excessive fluid intake.  Sodium level noted to be 126 yesterday.  Stable this morning.  Fluid restriction.  Serum osmolality 274.  Urine osmolality 188.  Urine sodium less than 10.  Urine potassium 28.  Recheck labs tomorrow.  Acute kidney injury Baseline creatinine is normal.  Acute renal failure likely in the setting of infection, ACE inhibitor, vancomycin. Patient's creatinine has been increasing the last few days.  Could be due to vancomycin which was discontinued 2 days ago.  He was also on lisinopril which was also held.  He has been making urine.  We will give him a fluid bolus  today and increase the rate of IV fluids.  Monitor urine output.  Avoid nephrotoxic agents.  Echocardiogram showed normal systolic function.  Renal ultrasound.  UA has been reviewed.    History of COPD Respiratory status is stable.  Continue nebulizer treatments.  Essential hypertension Monitor blood pressures closely.  Lisinopril was held.  He is on metoprolol and hydralazine.  Avoid hypotension.  Alcohol abuse No evidence for withdrawal currently.  CIWA protocol.  Anemia of chronic disease Anemia panel reviewed shows ferritin 754, TIBC 182, B12 301, folate 17.8.  Hemoglobin is stable.  No evidence of overt bleeding.  DVT prophylaxis: Lovenox.  Code Status:  Full code Family Communication: Discussed with the patient  Status is: Inpatient  Remains inpatient appropriate because:Ongoing active pain requiring inpatient pain management and IV treatments appropriate due to intensity of illness or inability to take PO   Dispo:  Patient From: Home  Planned Disposition: Home  Expected discharge date: 12/23/19  Medically stable for discharge: No      Consultants: Dr. August Saucer with orthopedic surgery   Procedures/Significant Events:  7/10 Echocardiogram;Left Ventricle: LVEF=60 to 65%.  -Left ventricular diastolic parameters were normal.     Cultures 7/9 blood AC NGTD 7/9 blood NGTD 7/12 urine pending    Antimicrobials: Anti-infectives (From admission, onward)   Start     Ordered Stop   12/19/19 1800  linezolid (ZYVOX) IVPB 600 mg     Discontinue     12/19/19 1746     12/16/19 1700  vancomycin (VANCOCIN) IVPB 1000 mg/200 mL premix  Status:  Discontinued        12/16/19 0628 12/19/19 1746   12/16/19 1400  piperacillin-tazobactam (ZOSYN) IVPB 3.375 g  Status:  Discontinued        12/16/19 0628 12/20/19 1602   12/16/19 0630  piperacillin-tazobactam (ZOSYN) IVPB 3.375 g        12/16/19 0628 12/16/19 0715   12/16/19 0345  vancomycin (VANCOREADY) IVPB 2000 mg/400 mL        12/16/19 0342 12/16/19 0642       Continuous Infusions:  sodium chloride 125 mL/hr at 12/22/19 0846   linezolid (ZYVOX) IV Stopped (12/22/19 0853)   methocarbamol (ROBAXIN) IV       Objective: Vitals:   12/21/19 2000 12/21/19 2356 12/22/19 0300 12/22/19 0751  BP: (!) 123/93 105/76 111/87 132/89  Pulse: 78 69 88 90  Resp: Temp: 97.8 F (36.6 C) 97.8 F (36.6 C) 97.7 F (36.5 C) 98.4 F (36.9 C)  TempSrc: Oral Oral Oral Oral  SpO2: 100% 99% 100% 99%  Weight:      Height:        Intake/Output Summary (Last 24 hours) at 12/22/2019 1127 Last data filed at 12/22/2019 1030 Gross per 24 hour  Intake 582 ml  Output 300 ml  Net 282 ml    Filed Weights   12/16/19 1154 12/21/19 1616  Weight: 101.5 kg 101.5 kg     Physical Exam:  General appearance: Awake alert.  In no distress Resp: Clear to auscultation bilaterally.  Normal effort Cardio: S1-S2 is normal regular.  No S3-S4.  No rubs murmurs or bruit GI: Abdomen is soft.  Nontender nondistended.  Bowel sounds are present normal.  No masses organomegaly Extremities: Wound VAC noted in the left leg.  Dressing covering the right lower extremity.  Neurologic: Alert and oriented x3.  No focal neurological deficits.    Lab Data Reviewed:  CBC: Recent Labs  Lab 12/18/19  6553 12/19/19 0242 12/20/19 0418 12/21/19 0207 12/22/19 0301  WBC 6.2 6.8 7.7 7.2 7.8  NEUTROABS 3.2 3.4 4.4 3.6 4.0  HGB 11.3* 11.5* 11.8* 11.1* 11.2*  HCT 33.8* 34.7* 35.4* 33.2* 34.0*  MCV 103.7* 103.6* 104.4* 104.7* 106.9*  PLT 300 276 277 263 287   Basic Metabolic Panel: Recent Labs  Lab 12/18/19 0251 12/18/19 0251 12/19/19 0242 12/19/19 0242 12/19/19 1249 12/19/19 1613 12/20/19 0418 12/21/19 0207 12/22/19 0301  NA 131*   < > 129*  --  131*  --  129* 126* 126*  K 3.8   < > 4.2  --  4.2  --  4.4 4.5 5.1  CL 100   < > 99  --  99  --  99 96* 96*  CO2 23   < > 23  --  26  --  22 21* 24  GLUCOSE 102*   < > 96  --  102*  --  101* 95 94  BUN 12   < > 13  --  14  --  19 21* 27*  CREATININE 0.85   < > 1.31*   < > 1.55* 1.50* 1.90* 2.16* 2.80*  CALCIUM 8.4*   < > 8.2*  --  8.2*  --  8.0* 8.1* 8.2*  MG 1.8  --  1.9  --   --   --  1.8 1.8 1.9  PHOS 4.2  --  4.7*  --   --   --  4.8* 4.1 5.0*   < > = values in this interval not displayed.   GFR: Estimated Creatinine Clearance: 35.1 mL/min (A) (by C-G formula based on SCr of 2.8 mg/dL (H)). Liver Function Tests: Recent Labs  Lab 12/18/19 0251 12/19/19 0242 12/20/19 0418 12/21/19 0207 12/22/19 0301  AST 45* 37 29 25 30   ALT 41 31 27 20 18   ALKPHOS 37* 39 39 36* 38  BILITOT 0.6 0.8 0.2* 0.5 0.5  PROT 7.4 7.2 7.1 7.0 6.8  ALBUMIN  1.9* 1.9* 1.9* 1.8* 1.9*   Sepsis Labs: Recent Labs  Lab 12/16/19 0151  LATICACIDVEN 1.7    Recent Results (from the past 240 hour(s))  Culture, blood (Routine X 2) w Reflex to ID Panel     Status: None   Collection Time: 12/16/19  1:43 AM   Specimen: BLOOD  Result Value Ref Range Status   Specimen Description BLOOD LEFT ANTECUBITAL  Final   Special Requests   Final    BOTTLES DRAWN AEROBIC AND ANAEROBIC Blood Culture results may not be optimal due to an inadequate volume of blood received in culture bottles   Culture   Final    NO GROWTH 5 DAYS Performed at [facility], [address], [place], [address] [place]    Report Status 12/21/2019 FINAL  Final  Culture, blood (Routine X 2) w Reflex to ID Panel     Status: None   Collection Time: 12/16/19  2:39 AM   Specimen: BLOOD  Result Value Ref Range Status   Specimen Description BLOOD SITE NOT SPECIFIED  Final   Special Requests   Final    BOTTLES DRAWN AEROBIC AND ANAEROBIC Blood Culture results may not be optimal due to an inadequate volume of blood received in culture bottles   Culture   Final    NO GROWTH 5 DAYS Performed at [facility], [address] [place], [address] [place]    Report Status 12/21/2019 FINAL  Final  SARS Coronavirus  2 by RT PCR (hospital order, performed in [place] hospital lab) Nasopharyngeal Nasopharyngeal Swab     Status: None   Collection Time: 12/16/19  3:48 AM   Specimen: Nasopharyngeal Swab  Result Value Ref Range Status   SARS Coronavirus 2 NEGATIVE NEGATIVE Final    Comment: (NOTE) SARS-CoV-2 target nucleic acids are NOT DETECTED.[facility]  The SARS-CoV-2 RNA is generally detectable in upper and lower respiratory specimens during the acute phase of infection. The lowest concentration of SARS-CoV-2 viral copies this assay can detect is 250 copies / mL. A negative result does not preclude SARS-CoV-2 infection and should not be used as the sole basis for treatment or other patient  management decisions.  A negative result may occur with improper specimen collection / handling, submission of specimen other than nasopharyngeal swab, presence of viral mutation(s) within the areas targeted by this assay, and inadequate number of viral copies (<250 copies / mL). A negative result must be combined with clinical observations, patient history, and epidemiological information.  Fact Sheet for Patients:   BoilerBrush.com.cyhttps://www.fda.gov/media/136312/download  Fact Sheet for Healthcare Providers: https://pope.com/https://www.fda.gov/media/136313/download  This test is not yet approved or  cleared by the [place] FDA and has been authorized for detection and/or diagnosis of SARS-CoV-2 by FDA under an Emergency Use Authorization (EUA).  This EUA will remain in effect (meaning this test can be used) for the duration of the COVID-19 declaration under Section 564(b)(1) of the Act, 21 U.S.C. section 360bbb-3(b)(1), unless the authorization is terminated or revoked sooner.  Performed at [facility], [address] [place], [place] [address]   Culture, Urine     Status: Abnormal   Collection Time: 12/19/19  3:55 PM   Specimen: Urine, Clean Catch  Result Value Ref Range Status   Specimen Description URINE, CLEAN CATCH  Final   Special Requests NONE  Final   Culture (A)  Final    <10,000 COLONIES/mL INSIGNIFICANT GROWTH Performed at [facility], [address] [place], [place] [address]    Report Status 12/21/2019 FINAL  Final  Aerobic/Anaerobic Culture (surgical/deep wound)     Status: None (Preliminary result)   Collection Time: 12/21/19  6:03 PM   Specimen: Abscess  Result Value Ref Range Status   Specimen Description ABSCESS LEFT CALF  Final   Special Requests TISSUE  Final   Gram Stain   Final    RARE WBC PRESENT, PREDOMINANTLY MONONUCLEAR RARE GRAM POSITIVE COCCI IN PAIRS    Culture   Final    NO GROWTH < 12 HOURS Performed at [facility], [address]  [place], [place] [address]    Report Status PENDING  Incomplete         Radiology Studies: MR TIBIA FIBULA RIGHT WO CONTRAST  Result Date: 12/20/2019 CLINICAL DATA:  Bilateral lower extremity swelling and pain EXAM: MRI OF LOWER RIGHT EXTREMITY WITHOUT CONTRAST TECHNIQUE: Multiplanar, multisequence MR imaging of the right was performed. No intravenous contrast was administered. COMPARISON:  None. FINDINGS: Bones/Joint/Cartilage Normal osseous marrow signal is seen throughout. No osseous fracture, marrow edema, or pathologic marrow infiltration. The joint spaces appear to be maintained. Ligaments Suboptimally visualized Muscles and Tendons Mild diffuse fatty atrophy with increased feathery signal seen throughout the muscles surrounding the lower extremity. The visualized portions of the tendons are intact. No evidence of tendinopathy or tear. Soft tissues There is diffuse subcutaneous edema and skin thickening seen surrounding the lower extremity. No loculated fluid collections or areas of ulceration is  seen. IMPRESSION: Findings suggestive of diffuse cellulitis involving the lower extremity. No evidence abscess or osteomyelitis. Electronically Signed   By: Jonna Clark M.D.   On: 12/20/2019 20:31   MR TIBIA FIBULA LEFT WO CONTRAST  Result Date: 12/20/2019 CLINICAL DATA:  Swelling in the bilateral legs most notable in the left lower extremity, rule out abscess EXAM: MRI OF LOWER LEFT EXTREMITY WITHOUT CONTRAST TECHNIQUE: Multiplanar, multisequence MR imaging of the left was performed. No intravenous contrast was administered. COMPARISON:  None. FINDINGS: Bones/Joint/Cartilage Normal osseous marrow signal is seen throughout. No osseous fracture, marrow edema, or pathologic marrow infiltration. The joint spaces appear to be maintained. Ligaments Suboptimally visualized Muscles and Tendons Mild diffuse fatty atrophy with increased feathery signal seen throughout the muscles surrounding the lower extremity. The  visualized portions of the tendons are intact. No evidence of tendinopathy or tear. Soft tissues There is diffuse subcutaneous edema and skin thickening seen surrounding the lower extremity. Overlying the posterior medial proximal lower extremity there is a multilocular collection measuring 4.9 x 1.0 by 6.8 cm which abuts the deep fascial layer overlying the medial gastrocnemius. IMPRESSION: Findings suggestive of diffuse cellulitis of the lower extremity with a multilocular fluid collection within the subcutaneous soft tissues overlying the medial head of the gastrocnemius measuring 4.9 x 1.0 x 6.9 cm which could represent a phlegmon/early abscess. No evidence of osteomyelitis. Electronically Signed   By: Jonna Clark M.D.   On: 12/20/2019 20:30        Scheduled Meds:  aspirin EC  81 mg Oral Daily   docusate sodium  100 mg Oral BID   folic acid  1 mg Oral Daily   gabapentin  100 mg Oral BID   heparin injection (subcutaneous)  5,000 Units Subcutaneous Q8H   hydrALAZINE  10 mg Intravenous Q6H   metoprolol tartrate  50 mg Oral BID   multivitamin with minerals  1 tablet Oral Daily   thiamine  100 mg Oral Daily   Continuous Infusions:  sodium chloride 125 mL/hr at 12/22/19 0846   linezolid (ZYVOX) IV Stopped (12/22/19 0853)   methocarbamol (ROBAXIN) IV       LOS: 6 days     Osvaldo Shipper, MD [facility]   If 7PM-7AM, please contact night-coverage [email or web address] Password [facility] 12/22/2019, 11:27 AM

## 2019-12-22 NOTE — Progress Notes (Signed)
  Subjective: Patient is a 54 year old male who is POD1 s/p left calf I&D.  Wound VAC is still in place.  Denies any fevers, chills, night sweats.  Notes that his leg still feels "hot".   Objective: Vital signs in last 24 hours: Temp:  [97.7 F (36.5 C)-98.4 F (36.9 C)] 98.4 F (36.9 C) (07/15 0751) Pulse Rate:  [69-90] 90 (07/15 0751) Resp:  [14-30] 18 (07/15 0751) BP: (100-134)/(72-93) 132/89 (07/15 0751) SpO2:  [98 %-100 %] 99 % (07/15 0751) Weight:  [101.5 kg] 101.5 kg (07/14 1616)  Intake/Output from previous day: 07/14 0701 - 07/15 0700 In: 360 [P.O.:360] Out: 400 [Urine:400] Intake/Output this shift: No intake/output data recorded.  Exam:  Wound VAC in place over the incision.  Suction seal intact.  About 100 to 125 cc in the wound VAC canister.  1+ DP pulse of the left leg.  Continued erythema throughout the leg.  Labs: Recent Labs    12/20/19 0418 12/21/19 0207 12/22/19 0301  HGB 11.8* 11.1* 11.2*   Recent Labs    12/21/19 0207 12/22/19 0301  WBC 7.2 7.8  RBC 3.17* 3.18*  HCT 33.2* 34.0*  PLT 263 287   Recent Labs    12/21/19 0207 12/22/19 0301  NA 126* 126*  K 4.5 5.1  CL 96* 96*  CO2 21* 24  BUN 21* 27*  CREATININE 2.16* 2.80*  GLUCOSE 95 94  CALCIUM 8.1* 8.2*   No results for input(s): LABPT, INR in the last 72 hours.  Assessment/Plan: Patient is POD1 s/p left calf I&D.  Wound VAC in place.  Plan for repeat I&D by Dr. Lajoyce Corners tomorrow.  DVT prophylaxis of subcutaneous heparin which we will discontinue by the time of any eventual surgery on Friday.  Discussed neck steps of management with the patient.  He understands.  Cultures are pending.   Robert Frank L Robert Frank 12/22/2019, 8:45 AM

## 2019-12-23 ENCOUNTER — Encounter (HOSPITAL_COMMUNITY)
Admission: EM | Disposition: A | Discharge status: Home / Self Care | Payer: Self-pay | Source: Home / Self Care | Attending: Internal Medicine

## 2019-12-23 LAB — CBC
HCT: 32.7 % — ABNORMAL LOW (ref 39.0–52.0)
Hemoglobin: 10.8 g/dL — ABNORMAL LOW (ref 13.0–17.0)
MCH: 35.3 pg — ABNORMAL HIGH (ref 26.0–34.0)
MCHC: 33 g/dL (ref 30.0–36.0)
MCV: 106.9 fL — ABNORMAL HIGH (ref 80.0–100.0)
Platelets: 291 10*3/uL (ref 150–400)
RBC: 3.06 MIL/uL — ABNORMAL LOW (ref 4.22–5.81)
RDW: 12.8 % (ref 11.5–15.5)
WBC: 6.8 10*3/uL (ref 4.0–10.5)
nRBC: 0 % (ref 0.0–0.2)

## 2019-12-23 LAB — BASIC METABOLIC PANEL
Anion gap: 7 (ref 5–15)
BUN: 28 mg/dL — ABNORMAL HIGH (ref 6–20)
CO2: 20 mmol/L — ABNORMAL LOW (ref 22–32)
Calcium: 7.9 mg/dL — ABNORMAL LOW (ref 8.9–10.3)
Chloride: 101 mmol/L (ref 98–111)
Creatinine, Ser: 2.75 mg/dL — ABNORMAL HIGH (ref 0.61–1.24)
GFR calc Af Amer: 29 mL/min — ABNORMAL LOW (ref >60)
GFR calc non Af Amer: 25 mL/min — ABNORMAL LOW (ref >60)
Glucose, Bld: 98 mg/dL (ref 70–99)
Potassium: 4.7 mmol/L (ref 3.5–5.1)
Sodium: 128 mmol/L — ABNORMAL LOW (ref 135–145)

## 2019-12-23 SURGERY — IRRIGATION AND DEBRIDEMENT EXTREMITY
Anesthesia: General | Laterality: Left

## 2019-12-23 MED ORDER — ENOXAPARIN SODIUM 60 MG/0.6ML ~~LOC~~ SOLN
50.0000 mg | SUBCUTANEOUS | Status: DC
  Administered 2019-12-23 – 2020-01-04 (×12): 50 mg via SUBCUTANEOUS
  Filled 2019-12-23 (×14): qty 0.5

## 2019-12-23 NOTE — Consult Note (Signed)
ORTHOPAEDIC CONSULTATION  REQUESTING PHYSICIAN: Lynn Ito, MD  Chief Complaint: Ulceration abscess left leg status post irrigation and debridement 2 days ago.  Robert Frank is a 54 y.o. male who presents with bilateral lower leg redness and swelling.  He was admitted to the hospital following a fall onto his right knee several days ago.  He complains of right knee pain with pain into his bilateral calves.  He notes this Pain has been present for several weeks.  Pain is getting worse.  He denies any fevers, chills, night sweats.  Does report malaise.  He has a history of alcoholism.  He has been receiving IV antibiotics from the medical team while he has been inpatient and he reports that this is helping with his Symptoms.  Localizes the majority of his pain to the posterior left calf.  He does have a large amount of swelling that appears to be an abscess on the medial aspect of the posterior left calf.  Denies any history of surgery to the left leg.  He does have a history of ACL reconstruction reportedly of the right knee.  Denies any groin pain.  Denies any upper extremity complaints.  He has been ambulating with a walker. Robert Frank is a 54 y.o. male who presents with   Past Medical History:  Diagnosis Date  . Alcoholism (HCC)   . Blind left eye   . COPD (chronic obstructive pulmonary disease) (HCC)   . 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 Hypertension    Past Surgical History:  Procedure Laterality Date  . ANTERIOR CRUCIATE LIGAMENT REPAIR    . I & D EXTREMITY Left 12/21/2019   Procedure: IRRIGATION AND DEBRIDEMENT LEFT CALF ABSCESS;  Surgeon: Cammy Copa, MD;  Location: [facility] OR;  Service: Orthopedics;  Laterality: Left;   Social History   Socioeconomic History  . Marital status: Single    Spouse name: Not on file  . Number of children: Not on file  . Years of education: Not on file  . Highest education  level: Not on file  Occupational History  . Not on file  Tobacco Use  . Smoking status: Current Every Day Smoker    Packs/day: 1.00  . Smokeless tobacco: Never Used  Substance and Sexual Activity  . Alcohol use: Yes    Comment: 5 40 oz/day  . Drug use: Not Currently  . Sexual activity: Not on file  Other Topics Concern  . Not on file  Social History Narrative  . Not on file   Social Determinants of Health   Financial Resource Strain:   . Difficulty of Paying Living Expenses:   Food Insecurity:   . Worried About Programme researcher, broadcasting/film/video in the Last Year:   . Barista in the Last Year:   Transportation Needs:   . Freight forwarder (Medical):   Marland Kitchen Lack of Transportation (Non-Medical):   Physical Activity:   . Days of Exercise per Week:   . Minutes of Exercise per Session:   Stress:   . Feeling of Stress :   Social Connections:   . Frequency of Communication with Friends and Family:   . Frequency of Social Gatherings with Friends and Family:   . Attends Religious Services:   . Active Member of Clubs or Organizations:   . Attends Banker Meetings:   Marland Kitchen Marital Status:    History reviewed. No  pertinent family history. - negative except otherwise stated in the family history section No Known Allergies Prior to Admission medications   Medication Sig Start Date End Date Taking? Authorizing Provider  feeding supplement, ENSURE ENLIVE, (ENSURE ENLIVE) LIQD Take 237 mLs by mouth 3 (three) times daily between meals. 12/12/19   Rolly Salter, MD  folic acid (FOLVITE) 1 MG tablet Take 1 tablet (1 mg total) by mouth daily. 12/13/19   Rolly Salter, MD  gabapentin (NEURONTIN) 100 MG capsule Take 1 capsule (100 mg total) by mouth 2 (two) times daily. 12/12/19   Rolly Salter, MD  lactulose (CHRONULAC) 10 GM/15ML solution Take 30 mLs (20 g total) by mouth daily. 12/13/19   Rolly Salter, MD  Multiple Vitamin (MULTIVITAMIN WITH MINERALS) TABS tablet Take 1 tablet by  mouth daily. 12/13/19   Rolly Salter, MD  naproxen sodium (ALEVE) 220 MG tablet Take 220 mg by mouth 2 (two) times daily as needed (knee pain).    [provider]  nicotine (NICODERM CQ - DOSED IN MG/24 HOURS) 14 mg/24hr patch Place 1 patch (14 mg total) onto the skin daily. 12/13/19   Rolly Salter, MD  nutrition supplement, Heinz Knuckles, Heinz Knuckles) PACK Take 1 packet by mouth 2 (two) times daily between meals. 12/12/19   Rolly Salter, MD  thiamine 100 MG tablet Take 1 tablet (100 mg total) by mouth daily. 12/13/19   Rolly Salter, MD   US RENAL  Result Date: 12/22/2019 CLINICAL DATA:  Acute kidney injury. EXAM: RENAL / URINARY TRACT ULTRASOUND COMPLETE COMPARISON:  None. FINDINGS: Right Kidney: Renal measurements: 11.5 x 6.3 x 7.1 cm = volume: 269 mL . Echogenicity within normal limits. No mass or hydronephrosis visualized. Left Kidney: Renal measurements: 12.4 x 7.4 x 7.3 cm = volume: 350 mL. Echogenicity within normal limits. No mass or hydronephrosis visualized. Bladder: Bladder is partially fall without abnormality Other: None. IMPRESSION: Normal renal ultrasound.  Relatively large kidneys bilaterally. Electronically Signed   By: Marlan Palau M.D.   On: 12/22/2019 18:34   - pertinent xrays, CT, MRI studies were reviewed and independently interpreted  Positive ROS: All other systems have been reviewed and were otherwise negative with the exception of those mentioned in the HPI and as above.  Physical Exam: General: Alert, no acute distress Psychiatric: Patient is competent for consent with normal mood and affect Lymphatic: No axillary or cervical lymphadenopathy Cardiovascular: No pedal edema Respiratory: No cyanosis, no use of accessory musculature GI: No organomegaly, abdomen is soft and non-tender    Images:  @ENCIMAGES @  Labs:  Lab Results  Component Value Date   HGBA1C 5.1 10/07/2018   ESRSEDRATE 72 (H) 12/07/2019   CRP 9.6 (H) 12/07/2019   CRP <0.8 10/06/2018    LABURIC 4.9 12/21/2019   REPTSTATUS PENDING 12/21/2019   GRAMSTAIN  12/21/2019    RARE WBC PRESENT, PREDOMINANTLY MONONUCLEAR RARE GRAM POSITIVE COCCI IN PAIRS    CULT  12/21/2019    NO GROWTH < 12 HOURS Performed at [facility], [address], [place], [place]    [id number] ENTEROCOCCUS FAECALIS (A) 12/05/2019    Lab Results  Component Value Date   ALBUMIN 1.9 (L) 12/22/2019   ALBUMIN 1.8 (L) 12/21/2019   ALBUMIN 1.9 (L) 12/20/2019   LABURIC 4.9 12/21/2019    Neurologic: Patient does not have protective sensation bilateral lower extremities.   MUSCULOSKELETAL:   Skin: Diffuse erythema throughout the bilateral lower legs, worse in the left  leg.  Focal area of fluctuance over the medial left posterior calf.  Tenderness overlying this area of fluctuance as well as diffusely through the bilateral calves..  Multiple abrasions are noted throughout the bilateral legs.  Positive knee effusion of the right knee.  Negative knee effusion of the left knee.  No pain with passive range of motion of the bilateral knees.  No pain out of proportion when evaluating knees.  No pain with logroll of bilateral lower extremities.  No groin pain elicited.  No pain with passive range of motion of the bilateral ankles.  No tenderness to palpation throughout the bilateral upper extremities.  No pain with range of motion of the bilateral fingers, wrist, elbow, shoulder.  Patient pulled out the wound VAC and a dry dressing is currently been applied he is postoperative day 2.  Assessment: Assessment: Postoperative day 2 status post irrigation debridement abscess left calf.  Plan: Plan: We will plan for return to the operating room today for repeat irrigation debridement and secondary wound closure.  Risks and benefits were discussed including persistent infection potential for amputation.  Patient states he is screwed by being n.p.o. today and states just cut my leg off.  Thank you for the  consult and the opportunity to see Mr. Robert Wilczak, MD [facility] [phone] 7:14 AM

## 2019-12-23 NOTE — Progress Notes (Signed)
Patient ID: Robert Frank, male   DOB: [birth year], 54 y.o.   MRN: [id number] I reviewed with patient his planned surgery this morning and discussed the importance of being n.p.o. prior to surgery.  After discussing the importance of being NPO patient did proceed to eat breakfast including bacon.  Surgery is canceled will need to plan for surgery next week.

## 2019-12-23 NOTE — Consult Note (Signed)
Robert Frank Admit Date: 12/15/2019 12/23/2019 Robert Frank Requesting Physician:  Robert Lund MD  Reason for Consult:  AKI HPI:  56M PMH ischemic CVA, heavy alcohol use, admitted to the hospital on 7/8 with a right knee injury; after recent admission and return home.  He was found to have bilateral lower extremity cellulitis and an abscess in the left calf.  Initially treated with vancomycin and Zosyn, transitioned over to linezolid.  He underwent incision and drainage of the abscess on 7/14 with plan for return today, which was canceled after the patient had breakfast.  He has a wound VAC.  Presenting GFR was normal and creatinine has worsened to 2.8 yesterday and 2.75 here today.  He was continued on his lisinopril for some time during his admission.  Renal ultrasound on 7/15 was without obstructive or acute structural issues.  Urine analysis on 7/12 had trace protein and no pyuria or hematuria.  7/14 urine sodium was less than 10.  ACE inhibitor has been held since 7/13.  He is currently receiving normal saline at 125 mL/h.  Urine output yesterday was 1.9 L.  Serum albumin has been less than 2.  LFTs are fairly normal.  No exposure to NSAIDs during his admission.  He does take Aleve at home.   Creatinine, Ser (mg/dL)  Date Value  69/62/9528 2.75 (H)  12/22/2019 2.80 (H)  12/21/2019 2.16 (H)  12/20/2019 1.90 (H)  12/19/2019 1.50 (H)  12/19/2019 1.55 (H)  12/19/2019 1.31 (H)  12/18/2019 0.85  12/17/2019 0.64  12/16/2019 0.76  ] I/Os: I/O last 3 completed shifts: In: 2207 [P.O.:957; I.V.:1250] Out: 1865 [Urine:1850; Drains:15]   ROS NSAIDS: No exposure IV Contrast no exposure TMP/SMX no exposure Hypotension not present Balance of 12 systems is negative w/ exceptions as above  PMH  Past Medical History:  Diagnosis Date  . Alcoholism (HCC)   . Blind left eye   . COPD (chronic obstructive pulmonary disease) (HCC)   . CVA (cerebral vascular accident) (HCC)    12 years ago-  residual R sided numbness  . Hypertension    PSH  Past Surgical History:  Procedure Laterality Date  . ANTERIOR CRUCIATE LIGAMENT REPAIR    . I & D EXTREMITY Left 12/21/2019   Procedure: IRRIGATION AND DEBRIDEMENT LEFT CALF ABSCESS;  Surgeon: Robert Copa, MD;  Location: [facility] OR;  Service: Orthopedics;  Laterality: Left;   FH History reviewed. No pertinent family history. SH  reports that he has been smoking. He has been smoking about 1.00 pack per day. He has never used smokeless tobacco. He reports current alcohol use. He reports previous drug use. Allergies No Known Allergies Home medications Prior to Admission medications   Medication Sig Start Date End Date Taking? Authorizing Provider  feeding supplement, ENSURE ENLIVE, (ENSURE ENLIVE) LIQD Take 237 mLs by mouth 3 (three) times daily between meals. 12/12/19   Robert Salter, MD  folic acid (FOLVITE) 1 MG tablet Take 1 tablet (1 mg total) by mouth daily. 12/13/19   Robert Salter, MD  gabapentin (NEURONTIN) 100 MG capsule Take 1 capsule (100 mg total) by mouth 2 (two) times daily. 12/12/19   Robert Salter, MD  lactulose (CHRONULAC) 10 GM/15ML solution Take 30 mLs (20 g total) by mouth daily. 12/13/19   Robert Salter, MD  Multiple Vitamin (MULTIVITAMIN WITH MINERALS) TABS tablet Take 1 tablet by mouth daily. 12/13/19   Robert Salter, MD  naproxen sodium (ALEVE) 220 MG tablet Take 220 mg  by mouth 2 (two) times daily as needed (knee pain).    [provider]  nicotine (NICODERM CQ - DOSED IN MG/24 HOURS) 14 mg/24hr patch Place 1 patch (14 mg total) onto the skin daily. 12/13/19   Robert Salter, MD  nutrition supplement, Heinz Knuckles, Heinz Knuckles) PACK Take 1 packet by mouth 2 (two) times daily between meals. 12/12/19   Robert Salter, MD  thiamine 100 MG tablet Take 1 tablet (100 mg total) by mouth daily. 12/13/19   Robert Salter, MD    Current Medications Scheduled Meds: . aspirin EC  81 mg Oral Daily  . docusate sodium  100 mg Oral  BID  . enoxaparin (LOVENOX) injection  50 mg Subcutaneous Q24H  . folic acid  1 mg Oral Daily  . gabapentin  100 mg Oral BID  . metoprolol tartrate  50 mg Oral BID  . multivitamin with minerals  1 tablet Oral Daily  . thiamine  100 mg Oral Daily   Continuous Infusions: . sodium chloride 125 mL/hr at 12/23/19 1432  . linezolid (ZYVOX) IV 600 mg (12/23/19 0516)  . methocarbamol (ROBAXIN) IV     PRN Meds:.acetaminophen **OR** acetaminophen, hydrALAZINE, HYDROmorphone (DILAUDID) injection, ipratropium-albuterol, liver oil-zinc oxide, methocarbamol **OR** methocarbamol (ROBAXIN) IV, metoCLOPramide **OR** metoCLOPramide (REGLAN) injection, ondansetron **OR** ondansetron (ZOFRAN) IV, oxyCODONE, oxyCODONE, polyethylene glycol, promethazine  CBC Recent Labs  Lab 12/20/19 0418 12/20/19 0418 12/21/19 0207 12/22/19 0301 12/23/19 0737  WBC 7.7   < > 7.2 7.8 6.8  NEUTROABS 4.4  --  3.6 4.0  --   HGB 11.8*   < > 11.1* 11.2* 10.8*  HCT 35.4*   < > 33.2* 34.0* 32.7*  MCV 104.4*   < > 104.7* 106.9* 106.9*  PLT 277   < > 263 287 291   < > = values in this interval not displayed.   Basic Metabolic Panel Recent Labs  Lab 12/17/19 0506 12/17/19 0506 12/18/19 0251 12/18/19 0251 12/19/19 0242 12/19/19 1249 12/19/19 1613 12/20/19 0418 12/21/19 0207 12/22/19 0301 12/23/19 0737  NA 130*   < > 131*  --  129* 131*  --  129* 126* 126* 128*  K 3.8   < > 3.8  --  4.2 4.2  --  4.4 4.5 5.1 4.7  CL 100   < > 100  --  99 99  --  99 96* 96* 101  CO2 24   < > 23  --  23 26  --  22 21* 24 20*  GLUCOSE 110*   < > 102*  --  96 102*  --  101* 95 94 98  BUN 12   < > 12  --  13 14  --  19 21* 27* 28*  CREATININE 0.64   < > 0.85   < > 1.31* 1.55* 1.50* 1.90* 2.16* 2.80* 2.75*  CALCIUM 8.3*   < > 8.4*  --  8.2* 8.2*  --  8.0* 8.1* 8.2* 7.9*  PHOS 4.3  --  4.2  --  4.7*  --   --  4.8* 4.1 5.0*  --    < > = values in this interval not displayed.    Physical Exam  Blood pressure (!) 144/103, pulse 83,  temperature 98.2 F (36.8 C), temperature source Oral, resp. rate 17, height 5' 7.01" (1.702 m), weight 101.5 kg, SpO2 100 %. GEN: Appears older than biological age, NAD ENT: NCAT EYES: EOMI CV: Regular, normal S1 and S2 PULM: CTA B ABD: Obese,  soft SKIN: Bilateral lower extremities bandaged EXT: Trace to 1+ edema bilaterally  Assessment 10M AKI from likely ACEi + Acute illness + hypoalbuminemia; bilateral cellulitis with soft tissue abscess; heavy alcohol use; chronic hyponatremia relatively stable  1. AKI, likely multifactorial from ACE inhibitor, acute infectious illness, hypoalbuminemia; nonoliguric, perhaps improving demonstrated by increased urine output yesterday and slightly improved serum creatinine this morning; no indication for RRT.  Renal ultrasound reassuring.  UA without features of GN.  Urine sodium was undetectable suggestive of impaired renal perfusion. 2. Bilateral lower extremity cellulitis on linezolid; soft tissue abscess status post I&D, plan to return to the OR the near future; per orthopedics and [facility] hospitalist 3. Heavy alcohol use, no drinking since last discharge 4. Hypertension, blood pressures fairly stable 5. Chronic hyponatremia, likely due to low solute intake with alcohol use; fairly stable, continue to monitor  Plan 1. Continue supportive care, IV fluids are okay 2. We will follow closely 3. Daily weights, Daily Renal Panel, Strict I/Os, Avoid nephrotoxins (NSAIDs, judicious IV Contrast)    Robert Frank  12/23/2019, 4:12 PM

## 2019-12-23 NOTE — Progress Notes (Signed)
Physical Therapy Treatment Patient Details Name: MAXIMINO COZZOLINO MRN: [id number] DOB: 14-Mar-1966 Today's Date: 12/23/2019    History of Present Illness 54 y.o. male with medical history significant of CVA and alcoholism presents to the emergency department for evaluation of right knee injury after a fall and BLE cellulitis.  Patient reports that he is having trouble walking, feels weak all over.    He has had several falls at home over the last few days, and is now s/p I&D of LLE calf abscess. He was admitted for similar complaints last week.  Patient has history of alcoholism.    PT Comments    Pt in bed upon arrival of PT, agreeable to session with focus on progression of OOB transfers and ambulation. The pt was continues to present with deficits in power, strength, and stability that impact his ability to transfer safely without assistance or elevated surfaces, but he was able to significantly increase his ambulation distance this session with use of RW. The pt reports significant fatigue following ambulation, but all vitals were stable throughout. The pt will continue to benefit from skilled PT to further progress capacity for transfers with improved independence, as well as general strength and stability to reduce risk of falls after d/c.     Follow Up Recommendations  SNF     Equipment Recommendations  Wheelchair (measurements PT);Wheelchair cushion (measurements PT)    Recommendations for Other Services       Precautions / Restrictions Precautions Precautions: Fall Precaution Comments: multiple wounds on buttocks and LEs Restrictions Weight Bearing Restrictions: Yes RLE Weight Bearing: Weight bearing as tolerated LLE Weight Bearing: Weight bearing as tolerated    Mobility  Bed Mobility Overal bed mobility: Needs Assistance Bed Mobility: Supine to Sit     Supine to sit: Supervision;HOB elevated     General bed mobility comments: supervision, but with extra time and  use of bed rails with HOB raised  Transfers Overall transfer level: Needs assistance Equipment used: Rolling walker (2 wheeled) Transfers: Sit to/from Stand Sit to Stand: Min assist         General transfer comment: minA to minG with elevated bed, but modA to lower to low chair due to poor eccentric control  Ambulation/Gait Ambulation/Gait assistance: Min assist Gait Distance (Feet): 150 Feet Assistive device: Rolling walker (2 wheeled) Gait Pattern/deviations: Step-through pattern;Decreased stride length;Decreased dorsiflexion - right;Decreased dorsiflexion - left;Trunk flexed Gait velocity: 0.16 m/s   General Gait Details: Cues for walker proximity, minA for stability. Decreased bilateral foot clearance, benefits from cues for positioning in RW, 2/4 DOE but VSS   Stairs             Wheelchair Mobility    Modified Rankin (Stroke Patients Only)       Balance Overall balance assessment: Needs assistance Sitting-balance support: Feet supported;No upper extremity supported Sitting balance-Leahy Scale: Good     Standing balance support: Bilateral upper extremity supported Standing balance-Leahy Scale: Poor Standing balance comment: reliant on BUE support of RW and minA                            Cognition Arousal/Alertness: Awake/alert Behavior During Therapy: Flat affect Overall Cognitive Status: No family/caregiver present to determine baseline cognitive functioning Area of Impairment: Safety/judgement;Problem solving;Awareness                         Safety/Judgement: Decreased awareness of safety;Decreased awareness of deficits  Awareness: Emergent Problem Solving: Slow processing;Requires verbal cues General Comments: pt with generally flat affect, remembers prior PT session, precautions      Exercises      General Comments General comments (skin integrity, edema, etc.): VSS on RA      Pertinent Vitals/Pain Pain Assessment:  Faces Faces Pain Scale: Hurts little more Pain Location: bilateral knees, calves Pain Descriptors / Indicators: Grimacing;Guarding Pain Intervention(s): Limited activity within patient's tolerance;Monitored during session;Repositioned           PT Goals (current goals can now be found in the care plan section) Acute Rehab PT Goals Patient Stated Goal: Go home PT Goal Formulation: With patient Time For Goal Achievement: 12/30/19 Potential to Achieve Goals: Fair Progress towards PT goals: Progressing toward goals    Frequency    Min 2X/week      PT Plan Current plan remains appropriate       AM-PAC PT "6 Clicks" Mobility   Outcome Measure  Help needed turning from your back to your side while in a flat bed without using bedrails?: None Help needed moving from lying on your back to sitting on the side of a flat bed without using bedrails?: A Little Help needed moving to and from a bed to a chair (including a wheelchair)?: A Little Help needed standing up from a chair using your arms (e.g., wheelchair or bedside chair)?: A Lot Help needed to walk in hospital room?: A Little Help needed climbing 3-5 steps with a railing? : A Lot 6 Click Score: 17    End of Session Equipment Utilized During Treatment: Gait belt Activity Tolerance: Patient tolerated treatment well Patient left: in chair;with call bell/phone within reach;with chair alarm set Nurse Communication: Mobility status PT Visit Diagnosis: Unsteadiness on feet (R26.81);Other abnormalities of gait and mobility (R26.89);History of falling (Z91.81);Pain Pain - part of body: Leg (BLE)     Time: 1443-1540 PT Time Calculation (min) (ACUTE ONLY): 27 min  Charges:  $Gait Training: 23-37 mins                     Rolm Baptise, PT, DPT   Acute Rehabilitation Department Pager #: [phone]   Gaetana Michaelis 12/23/2019, 2:59 PM

## 2019-12-23 NOTE — H&P (View-Only) (Signed)
Patient ID: Robert Frank, male   DOB: 09/18/1965, 54 y.o.   MRN: [id number] °I reviewed with patient his planned surgery this morning and discussed the importance of being n.p.o. prior to surgery.  After discussing the importance of being NPO patient did proceed to eat breakfast including bacon.  Surgery is canceled will need to plan for surgery next week. °

## 2019-12-23 NOTE — Plan of Care (Signed)

## 2019-12-23 NOTE — Progress Notes (Addendum)
PROGRESS NOTE    SENG LARCH  ZOX:[id number] DOB: 1965/09/05 DOA: 12/15/2019 PCP: Patient, No Pcp Per    Brief Narrative:  54 y.o.WM PMHx CVA and EtOH abuse who presented to the emergency department for evaluation of right knee injury after a fall. Patient has history of alcoholism. He reportedly lives in a home that is extremely dirty and run down and home health will not go see it due to safety reasons reportedly. He states he lives with his brother. Reports he has been feeling very weak and after his fall earlier today he could not get up by himself and his brother helped him get up in the bed. He has not been able to get up and ambulate or do any activities around the house. He does have chronic back pain which still bothers him but is not worse. He reports he has not drink any alcohol in a week. He had hyponatremia last week when he was admitted. Hyponatremia is most likely secondary to his alcoholism. Reviewing his chart he has chronic hyponatremia. He states he has not drink after his last discharge from the hospital. He is oriented to person place and the month but he is unsure of the day of the week.He is slow to respond and is a poor historian. He does continue to smoke. He denies having any fever. He denies any burning with urination and denies any cough or shortness of breath. He denies any loss of consciousness.  ED Course:In the emergency room patient is found to have bilateral lower extremity erythema and swelling. When compared to last week when he was admitted from the pictures in the chart his erythema and swelling is worse. He again has hyponatremia with a sodium of 122. Hospital service been asked to admit for further management    Consultants:   Ortho  Procedures:   Antimicrobials:   Linezolid   Subjective: Patient has no complaints.  Was supposed to go to the OR today.  However he ate breakfast after he was told not to eat  breakfast.  Objective: Vitals:   12/22/19 0751 12/22/19 1947 12/23/19 0300 12/23/19 0835  BP: 132/89 (!) 147/102 117/88 140/85  Pulse: 90 94 88 85  Resp: Temp: 98.4 F (36.9 C) 99.1 F (37.3 C) 98.2 F (36.8 C) 97.8 F (36.6 C)  TempSrc: Oral Oral Oral Oral  SpO2: 99% 99% 97% 94%  Weight:      Height:        Intake/Output Summary (Last 24 hours) at 12/23/2019 0854 Last data filed at 12/23/2019 0500 Gross per 24 hour  Intake 2207 ml  Output 1865 ml  Net 342 ml   Filed Weights   12/16/19 1154 12/21/19 1616  Weight: 101.5 kg 101.5 kg    Examination:  General exam: Appears calm and comfortable  Respiratory system: Clear to auscultation. Respiratory effort normal. Cardiovascular system: S1 & S2 heard, RRR. No JVD, murmurs, rubs, gallops or clicks. Gastrointestinal system: Abdomen is nondistended, soft and nontender.  Normal bowel sounds heard. Central nervous system: Alert and oriented. No focal neurological deficits. Extremities: No edema.  Left lower extremity wrapped Skin: Warm dry Psychiatry: Judgement and insight appear normal. Mood & affect appropriate.     Data Reviewed: I have personally reviewed following labs and imaging studies  CBC: Recent Labs  Lab 12/18/19 0251 12/18/19 0251 12/19/19 0242 12/20/19 0418 12/21/19 0207 12/22/19 0301 12/23/19 0737  WBC 6.2   < > 6.8 7.7 7.2 7.8  6.8  NEUTROABS 3.2  --  3.4 4.4 3.6 4.0  --   HGB 11.3*   < > 11.5* 11.8* 11.1* 11.2* 10.8*  HCT 33.8*   < > 34.7* 35.4* 33.2* 34.0* 32.7*  MCV 103.7*   < > 103.6* 104.4* 104.7* 106.9* 106.9*  PLT 300   < > 276 277 263 287 291   < > = values in this interval not displayed.   Basic Metabolic Panel: Recent Labs  Lab 12/18/19 0251 12/18/19 0251 12/19/19 0242 12/19/19 0242 12/19/19 1249 12/19/19 1249 12/19/19 1613 12/20/19 0418 12/21/19 0207 12/22/19 0301 12/23/19 0737  NA 131*   < > 129*   < > 131*  --   --  129* 126* 126* 128*  K 3.8   < > 4.2   < >  4.2  --   --  4.4 4.5 5.1 4.7  CL 100   < > 99   < > 99  --   --  99 96* 96* 101  CO2 23   < > 23   < > 26  --   --  22 21* 24 20*  GLUCOSE 102*   < > 96   < > 102*  --   --  101* 95 94 98  BUN 12   < > 13   < > 14  --   --  19 21* 27* 28*  CREATININE 0.85   < > 1.31*   < > 1.55*   < > 1.50* 1.90* 2.16* 2.80* 2.75*  CALCIUM 8.4*   < > 8.2*   < > 8.2*  --   --  8.0* 8.1* 8.2* 7.9*  MG 1.8  --  1.9  --   --   --   --  1.8 1.8 1.9  --   PHOS 4.2  --  4.7*  --   --   --   --  4.8* 4.1 5.0*  --    < > = values in this interval not displayed.   GFR: Estimated Creatinine Clearance: 35.7 mL/min (A) (by C-G formula based on SCr of 2.75 mg/dL (H)). Liver Function Tests: Recent Labs  Lab 12/18/19 0251 12/19/19 0242 12/20/19 0418 12/21/19 0207 12/22/19 0301  AST 45* 37 29 25 30   ALT 41 31 27 20 18   ALKPHOS 37* 39 39 36* 38  BILITOT 0.6 0.8 0.2* 0.5 0.5  PROT 7.4 7.2 7.1 7.0 6.8  ALBUMIN 1.9* 1.9* 1.9* 1.8* 1.9*   No results for input(s): LIPASE, AMYLASE in the last 168 hours. No results for input(s): AMMONIA in the last 168 hours. Coagulation Profile: No results for input(s): INR, PROTIME in the last 168 hours. Cardiac Enzymes: No results for input(s): CKTOTAL, CKMB, CKMBINDEX, TROPONINI in the last 168 hours. BNP (last 3 results) No results for input(s): PROBNP in the last 8760 hours. HbA1C: No results for input(s): HGBA1C in the last 72 hours. CBG: No results for input(s): GLUCAP in the last 168 hours. Lipid Profile: No results for input(s): CHOL, HDL, LDLCALC, TRIG, CHOLHDL, LDLDIRECT in the last 72 hours. Thyroid Function Tests: No results for input(s): TSH, T4TOTAL, FREET4, T3FREE, THYROIDAB in the last 72 hours. Anemia Panel: No results for input(s): VITAMINB12, FOLATE, FERRITIN, TIBC, IRON, RETICCTPCT in the last 72 hours. Sepsis Labs: No results for input(s): PROCALCITON, LATICACIDVEN in the last 168 hours.  Recent Results (from the past 240 hour(s))  Culture, blood  (Routine X 2) w Reflex to ID Panel  Status: None   Collection Time: 12/16/19  1:43 AM   Specimen: BLOOD  Result Value Ref Range Status   Specimen Description BLOOD LEFT ANTECUBITAL  Final   Special Requests   Final    BOTTLES DRAWN AEROBIC AND ANAEROBIC Blood Culture results may not be optimal due to an inadequate volume of blood received in culture bottles   Culture   Final    NO GROWTH 5 DAYS Performed at [facility], [address], [place], [place] [address]    Report Status 12/21/2019 FINAL  Final  Culture, blood (Routine X 2) w Reflex to ID Panel     Status: None   Collection Time: 12/16/19  2:39 AM   Specimen: BLOOD  Result Value Ref Range Status   Specimen Description BLOOD SITE NOT SPECIFIED  Final   Special Requests   Final    BOTTLES DRAWN AEROBIC AND ANAEROBIC Blood Culture results may not be optimal due to an inadequate volume of blood received in culture bottles   Culture   Final    NO GROWTH 5 DAYS Performed at [facility], [address], [place], [place] [address]    Report Status 12/21/2019 FINAL  Final  SARS Coronavirus 2 by RT PCR (hospital order, performed in [facility] hospital lab) Nasopharyngeal Nasopharyngeal Swab     Status: None   Collection Time: 12/16/19  3:48 AM   Specimen: Nasopharyngeal Swab  Result Value Ref Range Status   SARS Coronavirus 2 NEGATIVE NEGATIVE Final    Comment: (NOTE) SARS-CoV-2 target nucleic acids are NOT DETECTED.  The SARS-CoV-2 RNA is generally detectable in upper and lower respiratory specimens during the acute phase of infection. The lowest concentration of SARS-CoV-2 viral copies this assay can detect is 250 copies / mL. A negative result does not preclude SARS-CoV-2 infection and should not be used as the sole basis for treatment or other patient management decisions.  A negative result may occur with improper specimen collection / handling, submission of specimen other than nasopharyngeal swab,  presence of viral mutation(s) within the areas targeted by this assay, and inadequate number of viral copies (<250 copies / mL). A negative result must be combined with clinical observations, patient history, and epidemiological information.  Fact Sheet for Patients:   BoilerBrush.com.cy  Fact Sheet for Healthcare Providers: https://pope.com/  This test is not yet approved or  cleared by the [place] FDA and has been authorized for detection and/or diagnosis of SARS-CoV-2 by FDA under an Emergency Use Authorization (EUA).  This EUA will remain in effect (meaning this test can be used) for the duration of the COVID-19 declaration under Section 564(b)(1) of the Act, 21 U.S.C. section 360bbb-3(b)(1), unless the authorization is terminated or revoked sooner.  Performed at [facility], [address], [place], [place] [address]   Culture, Urine     Status: Abnormal   Collection Time: 12/19/19  3:55 PM   Specimen: Urine, Clean Catch  Result Value Ref Range Status   Specimen Description URINE, CLEAN CATCH  Final   Special Requests NONE  Final   Culture (A)  Final    <10,000 COLONIES/mL INSIGNIFICANT GROWTH Performed at [facility], [address], [place], [place] [address]    Report Status 12/21/2019 FINAL  Final  Aerobic/Anaerobic Culture (surgical/deep wound)     Status: None (Preliminary result)   Collection Time: 12/21/19  6:03 PM   Specimen: Abscess  Result Value Ref Range Status  Specimen Description ABSCESS LEFT CALF  Final   Special Requests TISSUE  Final   Gram Stain   Final    RARE WBC PRESENT, PREDOMINANTLY MONONUCLEAR RARE GRAM POSITIVE COCCI IN PAIRS    Culture   Final    NO GROWTH < 12 HOURS Performed at [facility], [address] [place], [place] [address]    Report Status PENDING  Incomplete         Radiology Studies: US RENAL  Result Date: 12/22/2019 CLINICAL DATA:  Acute  kidney injury. EXAM: RENAL / URINARY TRACT ULTRASOUND COMPLETE COMPARISON:  None. FINDINGS: Right Kidney: Renal measurements: 11.5 x 6.3 x 7.1 cm = volume: 269 mL . Echogenicity within normal limits. No mass or hydronephrosis visualized. Left Kidney: Renal measurements: 12.4 x 7.4 x 7.3 cm = volume: 350 mL. Echogenicity within normal limits. No mass or hydronephrosis visualized. Bladder: Bladder is partially fall without abnormality Other: None. IMPRESSION: Normal renal ultrasound.  Relatively large kidneys bilaterally. Electronically Signed   By: Marlan Palauharles  Clark M.D.   On: 12/22/2019 18:34        Scheduled Meds: . aspirin EC  81 mg Oral Daily  . docusate sodium  100 mg Oral BID  . folic acid  1 mg Oral Daily  . gabapentin  100 mg Oral BID  . metoprolol tartrate  50 mg Oral BID  . multivitamin with minerals  1 tablet Oral Daily  . thiamine  100 mg Oral Daily   Continuous Infusions: . sodium chloride 125 mL/hr at 12/22/19 0846  . linezolid (ZYVOX) IV 600 mg (12/23/19 0516)  . methocarbamol (ROBAXIN) IV      Assessment & Plan:   Principal Problem:   Bilateral cellulitis of lower leg Active Problems:   COPD (chronic obstructive pulmonary disease) (HCC)   Alcoholism (HCC)   Generalized weakness   Hyponatremia   Bilateral lower leg cellulitis   Essential hypertension   Anemia of chronic disease   Bilateral lower extremity cellulitis with abscess in the left calf area Patient was initially started on vancomycin and Zosyn.  However due to worsening renal function and was changed over to linezolid.  MRI was obtained which showed evidence for cellulitis but also shows an abscess in the left lower leg.  Orthopedics following  Patient was taken to the OR 7/14 for I&D.  Wound VAC is in place.  Plan is for repeat I&D on today however he ate breakfast and now postponed till next week We will continue linezolid    Hyponatremia Most likely due to alcohol abuse and excessive fluid intake.     Serum osmolality 274.  Urine osmolality 188.  Urine sodium less than 10.  Urine potassium 28.   Sodium today 128 Continue with IVF We will continue to monitor   acute kidney injury Baseline creatinine is normal.  Acute renal failure likely in the setting of infection, ACE inhibitor, vancomycin. Patient's creatinine has been increasing the last few days.  Could be due to vancomycin which was discontinued 2 days ago.  He was also on lisinopril which was also held.  He has been making urine.   Will continue to monitor if does not improve will consult nephrology  Avoid nephrotoxic agents.  Echocardiogram showed normal systolic function.   Renal ultrasound is normal   History of COPD Without exacerbation We will continue neb treatment    Essential hypertension Currently stable we will continue monitor closely lisinopril has been held due to above Continue metoprolol and  hydralazine Avoid hypotension   Alcohol abuse No evidence for withdrawal currently.  Continue with CIWA protocol  Continue folic acid and thiamine  Anemia of chronic disease Anemia panel reviewed shows ferritin 754, TIBC 182, B12 301, folate 17.8.  Hemoglobin is stable.   No evidence of overt bleeding  Continue to monitor    DVT prophylaxis: Lovenox.  Code Status: Full code Family Communication:  None at bedside  Status is: Inpatient  Remains inpatient appropriate because:IV treatments appropriate due to intensity of illness or inability to take PO   Dispo:  Patient From: Home  Planned Disposition: Home  Expected discharge date: TBD as he will be going to OR next week. Still with electrolyte abnormalities.  Medically stable for discharge: No             LOS: 7 days   Time spent: 45 minutes with more than 50% on COC    Lynn Ito, MD [facility] Pager [phone]  If 7PM-7AM, please contact night-coverage [email or web address] Password [email or web address] 12/23/2019, 8:54 AM

## 2019-12-24 LAB — BASIC METABOLIC PANEL
Anion gap: 9 (ref 5–15)
BUN: 26 mg/dL — ABNORMAL HIGH (ref 6–20)
CO2: 19 mmol/L — ABNORMAL LOW (ref 22–32)
Calcium: 8.2 mg/dL — ABNORMAL LOW (ref 8.9–10.3)
Chloride: 101 mmol/L (ref 98–111)
Creatinine, Ser: 2.31 mg/dL — ABNORMAL HIGH (ref 0.61–1.24)
GFR calc Af Amer: 36 mL/min — ABNORMAL LOW (ref >60)
GFR calc non Af Amer: 31 mL/min — ABNORMAL LOW (ref >60)
Glucose, Bld: 95 mg/dL (ref 70–99)
Potassium: 4.9 mmol/L (ref 3.5–5.1)
Sodium: 129 mmol/L — ABNORMAL LOW (ref 135–145)

## 2019-12-24 LAB — CBC
HCT: 34.1 % — ABNORMAL LOW (ref 39.0–52.0)
Hemoglobin: 10.9 g/dL — ABNORMAL LOW (ref 13.0–17.0)
MCH: 34 pg (ref 26.0–34.0)
MCHC: 32 g/dL (ref 30.0–36.0)
MCV: 106.2 fL — ABNORMAL HIGH (ref 80.0–100.0)
Platelets: 273 10*3/uL (ref 150–400)
RBC: 3.21 MIL/uL — ABNORMAL LOW (ref 4.22–5.81)
RDW: 12.6 % (ref 11.5–15.5)
WBC: 7.3 10*3/uL (ref 4.0–10.5)
nRBC: 0 % (ref 0.0–0.2)

## 2019-12-24 MED ORDER — HYDRALAZINE HCL 25 MG PO TABS
25.0000 mg | ORAL_TABLET | Freq: Three times a day (TID) | ORAL | Status: DC
  Administered 2019-12-24 – 2019-12-27 (×8): 25 mg via ORAL
  Filled 2019-12-24 (×8): qty 1

## 2019-12-24 NOTE — Progress Notes (Signed)
PROGRESS NOTE    Robert Frank  BLT:[id number] DOB: 02-13-1966 DOA: 12/15/2019 PCP: Patient, No Pcp Per    Brief Narrative:  54 y.o.WM PMHx CVA and EtOH abuse who presented to the emergency department for evaluation of right knee injury after a fall. Patient has history of alcoholism. He reportedly lives in a home that is extremely dirty and run down and home health will not go see it due to safety reasons reportedly. He states he lives with his brother. Reports he has been feeling very weak and after his fall earlier today he could not get up by himself and his brother helped him get up in the bed. He has not been able to get up and ambulate or do any activities around the house. He does have chronic back pain which still bothers him but is not worse. He reports he has not drink any alcohol in a week. He had hyponatremia last week when he was admitted. Hyponatremia is most likely secondary to his alcoholism. Reviewing his chart he has chronic hyponatremia. He states he has not drink after his last discharge from the hospital. He is oriented to person place and the month but he is unsure of the day of the week.He is slow to respond and is a poor historian. He does continue to smoke. He denies having any fever. He denies any burning with urination and denies any cough or shortness of breath. He denies any loss of consciousness.  ED Course:In the emergency room patient is found to have bilateral lower extremity erythema and swelling. When compared to last week when he was admitted from the pictures in the chart his erythema and swelling is worse. He again has hyponatremia with a sodium of 122. Hospital service been asked to admit for further management    Consultants:   Ortho  Procedures:   Antimicrobials:   Linezolid   Subjective: Sleepy this am has no complaints.  Objective: Vitals:   12/23/19 1535 12/23/19 2210 12/24/19 0458 12/24/19 0855  BP: (!) 144/103  (!) 153/105 (!) 153/85 (!) 154/98  Pulse: 83 86 90 92  Resp: 17 18 19 17   Temp: 98.2 F (36.8 C) 98.8 F (37.1 C) 98.5 F (36.9 C) 98.2 F (36.8 C)  TempSrc: Oral Oral Oral Oral  SpO2: 100% 98% 100% 93%  Weight:      Height:        Intake/Output Summary (Last 24 hours) at 12/24/2019 1402 Last data filed at 12/24/2019 0741 Gross per 24 hour  Intake --  Output 1050 ml  Net -1050 ml   Filed Weights   12/16/19 1154 12/21/19 1616  Weight: 101.5 kg 101.5 kg    Examination:  General exam: Sleepy, NAD Respiratory system: CTA, no R/R /w Cardiovascular system: Regular S1-S2 no murmurs  Gastrointestinal system: Soft, nondistended nontender positive bowel sounds  Central nervous system: Unable to assess as patient is sleepy Extremities: +LE edema.  Left lower extremity wrapped Skin: Warm dry Psychiatry: Unable to assess as patient is sleepy    Data Reviewed: I have personally reviewed following labs and imaging studies  CBC: Recent Labs  Lab 12/18/19 0251 12/18/19 0251 12/19/19 0242 12/19/19 0242 12/20/19 0418 12/21/19 0207 12/22/19 0301 12/23/19 0737 12/24/19 0818  WBC 6.2   < > 6.8   < > 7.7 7.2 7.8 6.8 7.3  NEUTROABS 3.2  --  3.4  --  4.4 3.6 4.0  --   --   HGB 11.3*   < > 11.5*   < >  11.8* 11.1* 11.2* 10.8* 10.9*  HCT 33.8*   < > 34.7*   < > 35.4* 33.2* 34.0* 32.7* 34.1*  MCV 103.7*   < > 103.6*   < > 104.4* 104.7* 106.9* 106.9* 106.2*  PLT 300   < > 276   < > 277 263 287 291 273   < > = values in this interval not displayed.   Basic Metabolic Panel: Recent Labs  Lab 12/18/19 0251 12/18/19 0251 12/19/19 0242 12/19/19 1249 12/20/19 0418 12/21/19 0207 12/22/19 0301 12/23/19 0737 12/24/19 0818  NA 131*   < > 129*   < > 129* 126* 126* 128* 129*  K 3.8   < > 4.2   < > 4.4 4.5 5.1 4.7 4.9  CL 100   < > 99   < > 99 96* 96* 101 101  CO2 23   < > 23   < > 22 21* 24 20* 19*  GLUCOSE 102*   < > 96   < > 101* 95 94 98 95  BUN 12   < > 13   < > 19 21* 27* 28*  26*  CREATININE 0.85   < > 1.31*   < > 1.90* 2.16* 2.80* 2.75* 2.31*  CALCIUM 8.4*   < > 8.2*   < > 8.0* 8.1* 8.2* 7.9* 8.2*  MG 1.8  --  1.9  --  1.8 1.8 1.9  --   --   PHOS 4.2  --  4.7*  --  4.8* 4.1 5.0*  --   --    < > = values in this interval not displayed.   GFR: Estimated Creatinine Clearance: 42.5 mL/min (A) (by C-G formula based on SCr of 2.31 mg/dL (H)). Liver Function Tests: Recent Labs  Lab 12/18/19 0251 12/19/19 0242 12/20/19 0418 12/21/19 0207 12/22/19 0301  AST 45* 37 29 25 30   ALT 41 31 27 20 18   ALKPHOS 37* 39 39 36* 38  BILITOT 0.6 0.8 0.2* 0.5 0.5  PROT 7.4 7.2 7.1 7.0 6.8  ALBUMIN 1.9* 1.9* 1.9* 1.8* 1.9*   No results for input(s): LIPASE, AMYLASE in the last 168 hours. No results for input(s): AMMONIA in the last 168 hours. Coagulation Profile: No results for input(s): INR, PROTIME in the last 168 hours. Cardiac Enzymes: No results for input(s): CKTOTAL, CKMB, CKMBINDEX, TROPONINI in the last 168 hours. BNP (last 3 results) No results for input(s): PROBNP in the last 8760 hours. HbA1C: No results for input(s): HGBA1C in the last 72 hours. CBG: No results for input(s): GLUCAP in the last 168 hours. Lipid Profile: No results for input(s): CHOL, HDL, LDLCALC, TRIG, CHOLHDL, LDLDIRECT in the last 72 hours. Thyroid Function Tests: No results for input(s): TSH, T4TOTAL, FREET4, T3FREE, THYROIDAB in the last 72 hours. Anemia Panel: No results for input(s): VITAMINB12, FOLATE, FERRITIN, TIBC, IRON, RETICCTPCT in the last 72 hours. Sepsis Labs: No results for input(s): PROCALCITON, LATICACIDVEN in the last 168 hours.  Recent Results (from the past 240 hour(s))  Culture, blood (Routine X 2) w Reflex to ID Panel     Status: None   Collection Time: 12/16/19  1:43 AM   Specimen: BLOOD  Result Value Ref Range Status   Specimen Description BLOOD LEFT ANTECUBITAL  Final   Special Requests   Final    BOTTLES DRAWN AEROBIC AND ANAEROBIC Blood Culture results  may not be optimal due to an inadequate volume of blood received in culture bottles   Culture  Final    NO GROWTH 5 DAYS Performed at [facility], [address], [place], [place] [address]    Report Status 12/21/2019 FINAL  Final  Culture, blood (Routine X 2) w Reflex to ID Panel     Status: None   Collection Time: 12/16/19  2:39 AM   Specimen: BLOOD  Result Value Ref Range Status   Specimen Description BLOOD SITE NOT SPECIFIED  Final   Special Requests   Final    BOTTLES DRAWN AEROBIC AND ANAEROBIC Blood Culture results may not be optimal due to an inadequate volume of blood received in culture bottles   Culture   Final    NO GROWTH 5 DAYS Performed at [facility], [address], [place], [place] [address]    Report Status 12/21/2019 FINAL  Final  SARS Coronavirus 2 by RT PCR (hospital order, performed in [facility] hospital lab) Nasopharyngeal Nasopharyngeal Swab     Status: None   Collection Time: 12/16/19  3:48 AM   Specimen: Nasopharyngeal Swab  Result Value Ref Range Status   SARS Coronavirus 2 NEGATIVE NEGATIVE Final    Comment: (NOTE) SARS-CoV-2 target nucleic acids are NOT DETECTED.  The SARS-CoV-2 RNA is generally detectable in upper and lower respiratory specimens during the acute phase of infection. The lowest concentration of SARS-CoV-2 viral copies this assay can detect is 250 copies / mL. A negative result does not preclude SARS-CoV-2 infection and should not be used as the sole basis for treatment or other patient management decisions.  A negative result may occur with improper specimen collection / handling, submission of specimen other than nasopharyngeal swab, presence of viral mutation(s) within the areas targeted by this assay, and inadequate number of viral copies (<250 copies / mL). A negative result must be combined with clinical observations, patient history, and epidemiological information.  Fact Sheet for Patients:    BoilerBrush.com.cy  Fact Sheet for Healthcare Providers: https://pope.com/  This test is not yet approved or  cleared by the [place] FDA and has been authorized for detection and/or diagnosis of SARS-CoV-2 by FDA under an Emergency Use Authorization (EUA).  This EUA will remain in effect (meaning this test can be used) for the duration of the COVID-19 declaration under Section 564(b)(1) of the Act, 21 U.S.C. section 360bbb-3(b)(1), unless the authorization is terminated or revoked sooner.  Performed at [facility], [address] [place], [place] [address]   Culture, Urine     Status: Abnormal   Collection Time: 12/19/19  3:55 PM   Specimen: Urine, Clean Catch  Result Value Ref Range Status   Specimen Description URINE, CLEAN CATCH  Final   Special Requests NONE  Final   Culture (A)  Final    <10,000 COLONIES/mL INSIGNIFICANT GROWTH Performed at [facility], [address], [place], [place] [address]    Report Status 12/21/2019 FINAL  Final  Aerobic/Anaerobic Culture (surgical/deep wound)     Status: None (Preliminary result)   Collection Time: 12/21/19  6:03 PM   Specimen: Abscess  Result Value Ref Range Status   Specimen Description ABSCESS LEFT CALF  Final   Special Requests TISSUE  Final   Gram Stain   Final    RARE WBC PRESENT, PREDOMINANTLY MONONUCLEAR RARE GRAM POSITIVE COCCI IN PAIRS    Culture   Final    NO GROWTH 3 DAYS Performed at [facility], [address] [place], [place] [address]    Report  Status PENDING  Incomplete         Radiology Studies: US RENAL  Result Date: 12/22/2019 CLINICAL DATA:  Acute kidney injury. EXAM: RENAL / URINARY TRACT ULTRASOUND COMPLETE COMPARISON:  None. FINDINGS: Right Kidney: Renal measurements: 11.5 x 6.3 x 7.1 cm = volume: 269 mL . Echogenicity within normal limits. No mass or hydronephrosis visualized. Left Kidney: Renal measurements: 12.4 x  7.4 x 7.3 cm = volume: 350 mL. Echogenicity within normal limits. No mass or hydronephrosis visualized. Bladder: Bladder is partially fall without abnormality Other: None. IMPRESSION: Normal renal ultrasound.  Relatively large kidneys bilaterally. Electronically Signed   By: Marlan Palau M.D.   On: 12/22/2019 18:34        Scheduled Meds: . aspirin EC  81 mg Oral Daily  . docusate sodium  100 mg Oral BID  . enoxaparin (LOVENOX) injection  50 mg Subcutaneous Q24H  . folic acid  1 mg Oral Daily  . gabapentin  100 mg Oral BID  . metoprolol tartrate  50 mg Oral BID  . multivitamin with minerals  1 tablet Oral Daily  . thiamine  100 mg Oral Daily   Continuous Infusions: . sodium chloride 125 mL/hr at 12/23/19 1432  . linezolid (ZYVOX) IV 600 mg (12/24/19 0519)  . methocarbamol (ROBAXIN) IV      Assessment & Plan:   Principal Problem:   Bilateral cellulitis of lower leg Active Problems:   COPD (chronic obstructive pulmonary disease) (HCC)   Alcoholism (HCC)   Generalized weakness   Hyponatremia   Bilateral lower leg cellulitis   Essential hypertension   Anemia of chronic disease   Bilateral lower extremity cellulitis with abscess in the left calf area Patient was initially started on vancomycin and Zosyn.  However due to worsening renal function and was changed over to linezolid.  MRI was obtained which showed evidence for cellulitis but also shows an abscess in the left lower leg.  Orthopedics following  Patient was taken to the OR 7/14 for I&D.  Wound VAC is in place.  Plan is for repeat I&D yesterday  however he ate breakfast and now postponed till next week Plan: continue linezolid Needs SNF F/u with ortho-plan for I/D next week    Hyponatremia Most likely due to alcohol abuse and decrease fluid will intake.   Serum osmolality 274.  Urine osmolality 188.  Urine sodium less than 10.  Urine potassium 28.  Proving  Sodium today 129 We will continue IV fluids Monitor  level  acute kidney injury Baseline creatinine is normal.  Acute renal failure likely in the setting of infection, ACE inhibitor, vancomycin. Patient's creatinine has been increasing the last few days.  Could be due to vancomycin which was discontinued 2 days ago.  He was also on lisinopril which was also held.  Improving with IV fluids Renal ultrasound reassuring Nephrology following-recommended continuing IV fluids   Avoid nephrotoxic agents.   Echocardiogram showed normal systolic function.     History of COPD Without exacerbation Continue neb treatment    Essential hypertension Currently stable we will continue monitor closely  lisinopril has been held due to above Continue metoprolol and hydralazine  Avoid hypotension   Alcohol abuse No evidence for withdrawal currently.  Continue on CIWA protocol, folic acid, thiamine   Anemia of chronic disease Anemia panel reviewed shows ferritin 754, TIBC 182, B12 301, folate 17.8.  Hemoglobin is stable.   No evidence of overt bleeding  Continue to monitor    DVT prophylaxis:  Lovenox.  Code Status: Full code Family Communication:  None at bedside  Status is: Inpatient  Remains inpatient appropriate because:IV treatments appropriate due to intensity of illness or inability to take PO   Dispo:  Patient From: Home  Planned Disposition: SNF  Expected discharge date: TBD as he will be going to OR next week. Still with electrolyte abnormalities.  Medically stable for discharge: No             LOS: 8 days   Time spent: 45 minutes with more than 50% on COC    Lynn Ito, MD [facility] Pager [phone]  If 7PM-7AM, please contact night-coverage [email or web address] Password [email or web address] 12/24/2019, 2:02 PM

## 2019-12-24 NOTE — Progress Notes (Signed)
Admit: 12/15/2019 LOS: 8  56M AKI from likely ACEi + Acute illness + hypoalbuminemia; bilateral cellulitis with soft tissue abscess; heavy alcohol use; chronic hyponatremia relatively stable  Subjective:  . NAEO . At least 0.6L UOP  . SCr downtrending, K ok,  . To OR?  07/16 0701 - 07/17 0700 In: -  Out: 600 [Urine:600]  Filed Weights   12/16/19 1154 12/21/19 1616  Weight: 101.5 kg 101.5 kg    Scheduled Meds: . aspirin EC  81 mg Oral Daily  . docusate sodium  100 mg Oral BID  . enoxaparin (LOVENOX) injection  50 mg Subcutaneous Q24H  . folic acid  1 mg Oral Daily  . gabapentin  100 mg Oral BID  . metoprolol tartrate  50 mg Oral BID  . multivitamin with minerals  1 tablet Oral Daily  . thiamine  100 mg Oral Daily   Continuous Infusions: . sodium chloride 125 mL/hr at 12/23/19 1432  . linezolid (ZYVOX) IV 600 mg (12/24/19 0519)  . methocarbamol (ROBAXIN) IV     PRN Meds:.acetaminophen **OR** acetaminophen, hydrALAZINE, HYDROmorphone (DILAUDID) injection, ipratropium-albuterol, liver oil-zinc oxide, methocarbamol **OR** methocarbamol (ROBAXIN) IV, metoCLOPramide **OR** metoCLOPramide (REGLAN) injection, ondansetron **OR** ondansetron (ZOFRAN) IV, oxyCODONE, oxyCODONE, polyethylene glycol, promethazine  Current Labs: reviewed    Physical Exam:  Blood pressure (!) 154/98, pulse 92, temperature 98.2 F (36.8 C), temperature source Oral, resp. rate 17, height 5' 7.01" (1.702 m), weight 101.5 kg, SpO2 93 %. GEN: Appears older than biological age, NAD ENT: NCAT EYES: EOMI CV: Regular, normal S1 and S2 PULM: CTA B ABD: Obese, soft SKIN: Bilateral lower extremities bandaged EXT: Trace to 1+ edema bilaterally  A 1. Resolving nonoiguric AKI, likely multifactorial from ACE inhibitor, acute infectious illness, hypoalbuminemia; nonoliguric. Renal ultrasound reassuring.  UA without features of GN.  Urine sodium was undetectable suggestive of impaired renal perfusion, improving with  IVFs.  2. Bilateral lower extremity cellulitis on linezolid; soft tissue abscess status post I&D, plan to return to the OR the near future; per orthopedics and [facility] 3. Heavy alcohol use, no drinking since last discharge 4. Hypertension, blood pressures fairly stable 5. Chronic hyponatremia, likely due to low solute intake with alcohol use; fairly stable, continue to monitor  P . Improving . Cont IVFs for another 24h . Daily weights, Daily Renal Panel, Strict I/Os, Avoid nephrotoxins (NSAIDs, judicious IV Contrast)    Sabra Heck MD 12/24/2019, 11:12 AM  Recent Labs  Lab 12/20/19 0418 12/20/19 0418 12/21/19 0207 12/21/19 0207 12/22/19 0301 12/23/19 0737 12/24/19 0818  NA 129*   < > 126*   < > 126* 128* 129*  K 4.4   < > 4.5   < > 5.1 4.7 4.9  CL 99   < > 96*   < > 96* 101 101  CO2 22   < > 21*   < > 24 20* 19*  GLUCOSE 101*   < > 95   < > 94 98 95  BUN 19   < > 21*   < > 27* 28* 26*  CREATININE 1.90*   < > 2.16*   < > 2.80* 2.75* 2.31*  CALCIUM 8.0*   < > 8.1*   < > 8.2* 7.9* 8.2*  PHOS 4.8*  --  4.1  --  5.0*  --   --    < > = values in this interval not displayed.   Recent Labs  Lab 12/20/19 0418 12/20/19 0418 12/21/19 0207 12/21/19 0207 12/22/19 0301  12/23/19 0737 12/24/19 0818  WBC 7.7   < > 7.2   < > 7.8 6.8 7.3  NEUTROABS 4.4  --  3.6  --  4.0  --   --   HGB 11.8*   < > 11.1*   < > 11.2* 10.8* 10.9*  HCT 35.4*   < > 33.2*   < > 34.0* 32.7* 34.1*  MCV 104.4*   < > 104.7*   < > 106.9* 106.9* 106.2*  PLT 277   < > 263   < > 287 291 273   < > = values in this interval not displayed.

## 2019-12-24 NOTE — Progress Notes (Signed)
Notified Dr. Marylu Lund about pt's abnormal blood pressure of 169/100. 10mg  of IV hydralazine given, pt not in distress and tolerated well. New orders placed.

## 2019-12-24 NOTE — Plan of Care (Signed)
  Problem: Education: Goal: Knowledge of General Education information will improve Description: Including pain rating scale, medication(s)/side effects and non-pharmacologic comfort measures 12/24/2019 0046 by Maxwell Marion, RN Outcome: Progressing 12/24/2019 0046 by Maxwell Marion, RN Outcome: Progressing   Problem: Education: Goal: Knowledge of General Education information will improve Description: Including pain rating scale, medication(s)/side effects and non-pharmacologic comfort measures 12/24/2019 0046 by Maxwell Marion, RN Outcome: Progressing 12/24/2019 0046 by Maxwell Marion, RN Outcome: Progressing   Problem: Education: Goal: Knowledge of General Education information will improve Description: Including pain rating scale, medication(s)/side effects and non-pharmacologic comfort measures 12/24/2019 0046 by Maxwell Marion, RN Outcome: Progressing 12/24/2019 0046 by Maxwell Marion, RN Outcome: Progressing   Problem: Education: Goal: Knowledge of General Education information will improve Description: Including pain rating scale, medication(s)/side effects and non-pharmacologic comfort measures 12/24/2019 0046 by Maxwell Marion, RN Outcome: Progressing 12/24/2019 0046 by Maxwell Marion, RN Outcome: Progressing

## 2019-12-24 NOTE — Plan of Care (Signed)
  Problem: Safety: Goal: Ability to remain free from injury will improve Outcome: Progressing   

## 2019-12-25 LAB — BASIC METABOLIC PANEL
Anion gap: 7 (ref 5–15)
BUN: 24 mg/dL — ABNORMAL HIGH (ref 6–20)
CO2: 23 mmol/L (ref 22–32)
Calcium: 8.4 mg/dL — ABNORMAL LOW (ref 8.9–10.3)
Chloride: 101 mmol/L (ref 98–111)
Creatinine, Ser: 2.23 mg/dL — ABNORMAL HIGH (ref 0.61–1.24)
GFR calc Af Amer: 38 mL/min — ABNORMAL LOW (ref >60)
GFR calc non Af Amer: 33 mL/min — ABNORMAL LOW (ref >60)
Glucose, Bld: 100 mg/dL — ABNORMAL HIGH (ref 70–99)
Potassium: 5.1 mmol/L (ref 3.5–5.1)
Sodium: 131 mmol/L — ABNORMAL LOW (ref 135–145)

## 2019-12-25 LAB — CBC
HCT: 33.1 % — ABNORMAL LOW (ref 39.0–52.0)
Hemoglobin: 10.6 g/dL — ABNORMAL LOW (ref 13.0–17.0)
MCH: 34.8 pg — ABNORMAL HIGH (ref 26.0–34.0)
MCHC: 32 g/dL (ref 30.0–36.0)
MCV: 108.5 fL — ABNORMAL HIGH (ref 80.0–100.0)
Platelets: 299 10*3/uL (ref 150–400)
RBC: 3.05 MIL/uL — ABNORMAL LOW (ref 4.22–5.81)
RDW: 12.9 % (ref 11.5–15.5)
WBC: 8.7 10*3/uL (ref 4.0–10.5)
nRBC: 0 % (ref 0.0–0.2)

## 2019-12-25 NOTE — Plan of Care (Signed)

## 2019-12-25 NOTE — Progress Notes (Signed)
RN called to room, found Pt sitting on the floor next to bed, Pt reports hitting his head on the bedside drawer, noted small bump on the head, no other injuries noted, vital signs taken, assisted to bed, vital signs rechecked, MD notified. Pt refused ice pack to head and also refused to update his family.  Pt states he had been ambulating by himself since admission and so he did not push his call button to go to the bathroom. Educated him on calling staff to get out of bed, high fall risk bundle implemented.

## 2019-12-25 NOTE — Progress Notes (Signed)
Admit: 12/15/2019 LOS: 9  62M AKI from likely ACEi + Acute illness + hypoalbuminemia; bilateral cellulitis with soft tissue abscess; heavy alcohol use; chronic hyponatremia relatively stable  Subjective:   NAEO  At least 1.6L UOP   SCr downtrending, K 5.1  07/17 0701 - 07/18 0700 In: -  Out: 1650 [Urine:1650]  Filed Weights   12/16/19 1154 12/21/19 1616  Weight: 101.5 kg 101.5 kg    Scheduled Meds:  aspirin EC  81 mg Oral Daily   docusate sodium  100 mg Oral BID   enoxaparin (LOVENOX) injection  50 mg Subcutaneous Q24H   folic acid  1 mg Oral Daily   gabapentin  100 mg Oral BID   hydrALAZINE  25 mg Oral Q8H   metoprolol tartrate  50 mg Oral BID   multivitamin with minerals  1 tablet Oral Daily   thiamine  100 mg Oral Daily   Continuous Infusions:  sodium chloride 75 mL/hr at 12/25/19 0846   linezolid (ZYVOX) IV 600 mg (12/25/19 0629)   methocarbamol (ROBAXIN) IV     PRN Meds:.acetaminophen **OR** acetaminophen, hydrALAZINE, HYDROmorphone (DILAUDID) injection, ipratropium-albuterol, liver oil-zinc oxide, methocarbamol **OR** methocarbamol (ROBAXIN) IV, metoCLOPramide **OR** metoCLOPramide (REGLAN) injection, ondansetron **OR** ondansetron (ZOFRAN) IV, oxyCODONE, oxyCODONE, polyethylene glycol, promethazine  Current Labs: reviewed    Physical Exam:  Blood pressure (!) 144/94, pulse 88, temperature 97.8 F (36.6 C), temperature source Oral, resp. rate 16, height 5' 7.01" (1.702 m), weight 101.5 kg, SpO2 93 %. GEN: Appears older than biological age, NAD ENT: NCAT EYES: EOMI CV: Regular, normal S1 and S2 PULM: CTA B ABD: Obese, soft SKIN: Bilateral lower extremities bandaged EXT: 2+ edema bilaterally  A 1. Resolving nonoliguric AKI, likely multifactorial from ACE inhibitor, acute infectious illness, hypoalbuminemia; nonoliguric. Renal ultrasound reassuring.  UA without features of GN.  Urine sodium was undetectable suggestive of impaired renal perfusion,  improving with IVFs.  2. Bilateral lower extremity cellulitis on linezolid; soft tissue abscess status post I&D, plan to return to the OR the near future; per orthopedics and [facility] 3. Heavy alcohol use, no drinking since last discharge 4. Hypertension, blood pressures fairly stable 5. Chronic hyponatremia, likely due to low solute intake with alcohol use; fairly stable, continue to monitor  P  Improving  Stop IVFs, developing some edema  Cont supportive care  Daily weights, Daily Renal Panel, Strict I/Os, Avoid nephrotoxins (NSAIDs, judicious IV Contrast)    Sabra Heck MD 12/25/2019, 11:23 AM  Recent Labs  Lab 12/20/19 1610 12/20/19 9604 12/21/19 0207 12/21/19 0207 12/22/19 0301 12/22/19 0301 12/23/19 0737 12/24/19 0818 12/25/19 0254  NA 129*   < > 126*   < > 126*   < > 128* 129* 131*  K 4.4   < > 4.5   < > 5.1   < > 4.7 4.9 5.1  CL 99   < > 96*   < > 96*   < > 101 101 101  CO2 22   < > 21*   < > 24   < > 20* 19* 23  GLUCOSE 101*   < > 95   < > 94   < > 98 95 100*  BUN 19   < > 21*   < > 27*   < > 28* 26* 24*  CREATININE 1.90*   < > 2.16*   < > 2.80*   < > 2.75* 2.31* 2.23*  CALCIUM 8.0*   < > 8.1*   < > 8.2*   < >  7.9* 8.2* 8.4*  PHOS 4.8*  --  4.1  --  5.0*  --   --   --   --    < > = values in this interval not displayed.   Recent Labs  Lab 12/20/19 0418 12/20/19 0418 12/21/19 0207 12/21/19 0207 12/22/19 0301 12/22/19 0301 12/23/19 0737 12/24/19 0818 12/25/19 0254  WBC 7.7   < > 7.2   < > 7.8   < > 6.8 7.3 8.7  NEUTROABS 4.4  --  3.6  --  4.0  --   --   --   --   HGB 11.8*   < > 11.1*   < > 11.2*   < > 10.8* 10.9* 10.6*  HCT 35.4*   < > 33.2*   < > 34.0*   < > 32.7* 34.1* 33.1*  MCV 104.4*   < > 104.7*   < > 106.9*   < > 106.9* 106.2* 108.5*  PLT 277   < > 263   < > 287   < > 291 273 299   < > = values in this interval not displayed.

## 2019-12-25 NOTE — Progress Notes (Addendum)
Sacral dressing changed per order but Pt refused dressing change to L leg at this time. Pt stated he will call when he's ready.   17:38 Pt refused dressing change to LLE. Will ask again later.  18:40 changed dressing to LLE per order, Pt tolerated well, denied pain.

## 2019-12-25 NOTE — Plan of Care (Signed)
  Problem: Pain Managment: Goal: General experience of comfort will improve Outcome: Progressing   Problem: Safety: Goal: Ability to remain free from injury will improve Outcome: Progressing   Problem: Skin Integrity: Goal: Risk for impaired skin integrity will decrease Outcome: Progressing   

## 2019-12-25 NOTE — Progress Notes (Signed)
PROGRESS NOTE  Robert Frank PNT:[id number] DOB: 07/25/1965 DOA: 12/15/2019 PCP: Patient, No Pcp Per  HPI/Recap of past 24 hours: 54 y.o.WM PMHxCVA and EtOH abuse who presented to the emergency department for evaluation of right knee injury after a fall. Patient has history of alcoholism. He reportedly lives in a home that is extremely dirty and run down and home health will not go see it due to safety reasons reportedly. He states he lives with his brother. Reports he has been feeling very weak and after his fall earlier today he could not get up by himself and his brother helped him get up in the bed. He has not been able to get up and ambulate or do any activities around the house. He does have chronic back pain which still bothers him but is not worse. He reports he has not drink any alcohol in a week. He had hyponatremia last week when he was admitted. Hyponatremia is most likely secondary to his alcoholism. Reviewing his chart he has chronic hyponatremia. He states he has not drink after his last discharge from the hospital. He is oriented to person place and the month but he is unsure of the day of the week.He is slow to respond and is a poor historian. He does continue to smoke. He denies having any fever. He denies any burning with urination and denies any cough or shortness of breath. He denies any loss of consciousness.  ED Course:In the emergency room patient is found to have bilateral lower extremity erythema and swelling. When compared to last week when he was admitted from the pictures in the chart his erythema and swelling is worse. He again has hyponatremia with a sodium of 122. Hospital service been asked to admit for further management   12/25/19: Seen and examined.  Disheveled appearance, states he lives with his brother.  He is receptive to SNF placement.  Likely I&D next week by orthopedic surgery.    Fell this morning, noticed small bump on the  head, no other injuries noted.  Vital signs taken and are stable.  We will continue to monitor.  Fall precautions in place.  Assessment/Plan: Principal Problem:   Bilateral cellulitis of lower leg Active Problems:   COPD (chronic obstructive pulmonary disease) (HCC)   Alcoholism (HCC)   Generalized weakness   Hyponatremia   Bilateral lower leg cellulitis   Essential hypertension   Anemia of chronic disease  Bilateral lower extremity cellulitis with abscess in the left calf area Patient was initially started on vancomycin and Zosyn. However due to worsening renal function and was changed over to linezolid. MRI was obtained which showedevidence for cellulitis but also shows an abscess in the left lower leg.  Orthopedics following Patient was taken to the OR 7/14 for I&D. Plan is for repeat I&D  next week   continue linezolid  Hyponatremia, improving Hypervolemic on today's exam DC IV fluid Serum sodium improving up to 131 Nephrology assisting with the management.  Acute kidney injury Baseline creatinine is normal.Acute renal failure likely multifactorial, in the setting of infection, ACE inhibitor, vancomycin. Patient's creatinine has been increasing the last few days. Could be due to vancomycin which was discontinued 2 days ago. He was also on lisinopril which was also held.  Improved with IV fluids, IV fluid discontinued on 7/18 Renal ultrasound reassuring Nephrology following\ Avoid nephrotoxic agents and hypotension Echocardiogram showed normal systolic function.  Repeat BMP in the morning  History of COPD Stable  Essential hypertension  Currently stable we will continue monitor closely  lisinopril has been held due to above Continue metoprolol and hydralazine  Avoid hypotension  Alcohol abuse No evidence of alcohol withdrawal at the time of this visit Continue on CIWA protocol, folic acid, thiamine  Anemia of chronic disease Anemia panel reviewed  shows ferritin 754, TIBC 182, B12 301, folate 17.8. Hemoglobin is stable.  No evidence of overt bleeding  Continue to monitor   Ambulatory dysfunction with fall Fell on 12/25/2019 Noticed small bump on the head, no other injuries noted.  Vital signs taken and are stable.  We will continue to monitor.  Fall precautions in place. PT recommended SNF TOC assisting with SNF placement. Continue PT OT with assistance and fall precautions   DVT prophylaxis:Lovenox subcu daily Code Status:Full code Family Communication: None at bedside  Status is: Inpatient  Remains inpatient appropriate because:IV treatments appropriate due to intensity of illness or inability to take PO   Dispo:             Patient From: Home             Planned Disposition: SNF             Expected discharge date: TBD as he will be going to OR next week. Still with electrolyte abnormalities.             Medically stable for discharge: No     Objective: Vitals:   12/24/19 1447 12/24/19 1822 12/24/19 2034 12/25/19 0344  BP: (!) 169/100 (!) 158/92 (!) 156/96 (!) 154/88  Pulse: 87 (!) 102 (!) 106 99  Resp: Temp: 99.4 F (37.4 C)  97.8 F (36.6 C) 98.1 F (36.7 C)  TempSrc: Oral  Oral Oral  SpO2: 99%  99% 100%  Weight:      Height:        Intake/Output Summary (Last 24 hours) at 12/25/2019 0742 Last data filed at 12/25/2019 0344 Gross per 24 hour  Intake --  Output 800 ml  Net -800 ml   Filed Weights   12/16/19 1154 12/21/19 1616  Weight: 101.5 kg 101.5 kg    Exam:  . General: 54 y.o. year-old male well developed well nourished in no acute distress.  Alert and oriented x3.  Disheveled appearance. . Cardiovascular: Regular rate and rhythm with no rubs or gallops.  No thyromegaly or JVD noted.   Marland Kitchen Respiratory: Clear to auscultation with no wheezes or rales. Good inspiratory effort. . Abdomen: Soft nontender nondistended with normal bowel sounds x4 quadrants. . Musculoskeletal: 1+  pitting edema in lower extremities bilaterally.  Very dry skin affecting lower extremities bilaterally. Marland Kitchen Psychiatry: Mood is appropriate for condition and setting   Data Reviewed: CBC: Recent Labs  Lab 12/19/19 0242 12/19/19 0242 12/20/19 0418 12/20/19 0418 12/21/19 0207 12/22/19 0301 12/23/19 0737 12/24/19 0818 12/25/19 0254  WBC 6.8   < > 7.7   < > 7.2 7.8 6.8 7.3 8.7  NEUTROABS 3.4  --  4.4  --  3.6 4.0  --   --   --   HGB 11.5*   < > 11.8*   < > 11.1* 11.2* 10.8* 10.9* 10.6*  HCT 34.7*   < > 35.4*   < > 33.2* 34.0* 32.7* 34.1* 33.1*  MCV 103.6*   < > 104.4*   < > 104.7* 106.9* 106.9* 106.2* 108.5*  PLT 276   < > 277   < > 263 287 291 273 299   < > =  values in this interval not displayed.   Basic Metabolic Panel: Recent Labs  Lab 12/19/19 0242 12/19/19 1249 12/20/19 0418 12/20/19 0418 12/21/19 0207 12/22/19 0301 12/23/19 0737 12/24/19 0818 12/25/19 0254  NA 129*   < > 129*   < > 126* 126* 128* 129* 131*  K 4.2   < > 4.4   < > 4.5 5.1 4.7 4.9 5.1  CL 99   < > 99   < > 96* 96* 101 101 101  CO2 23   < > 22   < > 21* 24 20* 19* 23  GLUCOSE 96   < > 101*   < > 95 94 98 95 100*  BUN 13   < > 19   < > 21* 27* 28* 26* 24*  CREATININE 1.31*   < > 1.90*   < > 2.16* 2.80* 2.75* 2.31* 2.23*  CALCIUM 8.2*   < > 8.0*   < > 8.1* 8.2* 7.9* 8.2* 8.4*  MG 1.9  --  1.8  --  1.8 1.9  --   --   --   PHOS 4.7*  --  4.8*  --  4.1 5.0*  --   --   --    < > = values in this interval not displayed.   GFR: Estimated Creatinine Clearance: 44 mL/min (A) (by C-G formula based on SCr of 2.23 mg/dL (H)). Liver Function Tests: Recent Labs  Lab 12/19/19 0242 12/20/19 0418 12/21/19 0207 12/22/19 0301  AST 37 29 25 30   ALT 31 27 20 18   ALKPHOS 39 39 36* 38  BILITOT 0.8 0.2* 0.5 0.5  PROT 7.2 7.1 7.0 6.8  ALBUMIN 1.9* 1.9* 1.8* 1.9*   No results for input(s): LIPASE, AMYLASE in the last 168 hours. No results for input(s): AMMONIA in the last 168 hours. Coagulation Profile: No  results for input(s): INR, PROTIME in the last 168 hours. Cardiac Enzymes: No results for input(s): CKTOTAL, CKMB, CKMBINDEX, TROPONINI in the last 168 hours. BNP (last 3 results) No results for input(s): PROBNP in the last 8760 hours. HbA1C: No results for input(s): HGBA1C in the last 72 hours. CBG: No results for input(s): GLUCAP in the last 168 hours. Lipid Profile: No results for input(s): CHOL, HDL, LDLCALC, TRIG, CHOLHDL, LDLDIRECT in the last 72 hours. Thyroid Function Tests: No results for input(s): TSH, T4TOTAL, FREET4, T3FREE, THYROIDAB in the last 72 hours. Anemia Panel: No results for input(s): VITAMINB12, FOLATE, FERRITIN, TIBC, IRON, RETICCTPCT in the last 72 hours. Urine analysis:    Component Value Date/Time   COLORURINE YELLOW 12/19/2019 1800   APPEARANCEUR HAZY (A) 12/19/2019 1800   LABSPEC 1.008 12/19/2019 1800   PHURINE 5.0 12/19/2019 1800   GLUCOSEU NEGATIVE 12/19/2019 1800   HGBUR MODERATE (A) 12/19/2019 1800   BILIRUBINUR NEGATIVE 12/19/2019 1800   KETONESUR NEGATIVE 12/19/2019 1800   PROTEINUR 30 (A) 12/19/2019 1800   NITRITE NEGATIVE 12/19/2019 1800   LEUKOCYTESUR NEGATIVE 12/19/2019 1800   Sepsis Labs: @LABRCNTIP (procalcitonin:4,lacticidven:4)  ) Recent Results (from the past 240 hour(s))  Culture, blood (Routine X 2) w Reflex to ID Panel     Status: None   Collection Time: 12/16/19  1:43 AM   Specimen: BLOOD  Result Value Ref Range Status   Specimen Description BLOOD LEFT ANTECUBITAL  Final   Special Requests   Final    BOTTLES DRAWN AEROBIC AND ANAEROBIC Blood Culture results may not be optimal due to an inadequate volume of blood received in culture bottles  Culture   Final    NO GROWTH 5 DAYS Performed at [facility], [address] [place], [place] [address]    Report Status 12/21/2019 FINAL  Final  Culture, blood (Routine X 2) w Reflex to ID Panel     Status: None   Collection Time: 12/16/19  2:39 AM   Specimen: BLOOD  Result  Value Ref Range Status   Specimen Description BLOOD SITE NOT SPECIFIED  Final   Special Requests   Final    BOTTLES DRAWN AEROBIC AND ANAEROBIC Blood Culture results may not be optimal due to an inadequate volume of blood received in culture bottles   Culture   Final    NO GROWTH 5 DAYS Performed at [facility], [address] [place], [place] [address]    Report Status 12/21/2019 FINAL  Final  SARS Coronavirus 2 by RT PCR (hospital order, performed in [facility] lab) Nasopharyngeal Nasopharyngeal Swab     Status: None   Collection Time: 12/16/19  3:48 AM   Specimen: Nasopharyngeal Swab  Result Value Ref Range Status   SARS Coronavirus 2 NEGATIVE NEGATIVE Final    Comment: (NOTE) SARS-CoV-2 target nucleic acids are NOT DETECTED.  The SARS-CoV-2 RNA is generally detectable in upper and lower respiratory specimens during the acute phase of infection. The lowest concentration of SARS-CoV-2 viral copies this assay can detect is 250 copies / mL. A negative result does not preclude SARS-CoV-2 infection and should not be used as the sole basis for treatment or other patient management decisions.  A negative result may occur with improper specimen collection / handling, submission of specimen other than nasopharyngeal swab, presence of viral mutation(s) within the areas targeted by this assay, and inadequate number of viral copies (<250 copies / mL). A negative result must be combined with clinical observations, patient history, and epidemiological information.  Fact Sheet for Patients:   BoilerBrush.com.cyhttps://www.fda.gov/media/136312/download  Fact Sheet for Healthcare Providers: https://pope.com/https://www.fda.gov/media/136313/download  This test is not yet approved or  cleared by the [place] FDA and has been authorized for detection and/or diagnosis of SARS-CoV-2 by FDA under an Emergency Use Authorization (EUA).  This EUA will remain in effect (meaning this test can be used) for the  duration of the COVID-19 declaration under Section 564(b)(1) of the Act, 21 U.S.C. section 360bbb-3(b)(1), unless the authorization is terminated or revoked sooner.  Performed at [facility], [address] [place], [place] [address]   Culture, Urine     Status: Abnormal   Collection Time: 12/19/19  3:55 PM   Specimen: Urine, Clean Catch  Result Value Ref Range Status   Specimen Description URINE, CLEAN CATCH  Final   Special Requests NONE  Final   Culture (A)  Final    <10,000 COLONIES/mL INSIGNIFICANT GROWTH Performed at [facility], [address] [place], [place] [address]    Report Status 12/21/2019 FINAL  Final  Aerobic/Anaerobic Culture (surgical/deep wound)     Status: None (Preliminary result)   Collection Time: 12/21/19  6:03 PM   Specimen: Abscess  Result Value Ref Range Status   Specimen Description ABSCESS LEFT CALF  Final   Special Requests TISSUE  Final   Gram Stain   Final    RARE WBC PRESENT, PREDOMINANTLY MONONUCLEAR RARE GRAM POSITIVE COCCI IN PAIRS    Culture   Final    NO GROWTH 3 DAYS Performed at [facility], [address] [place], [place] [address]  Report Status PENDING  Incomplete      Studies: No results found.  Scheduled Meds: . aspirin EC  81 mg Oral Daily  . docusate sodium  100 mg Oral BID  . enoxaparin (LOVENOX) injection  50 mg Subcutaneous Q24H  . folic acid  1 mg Oral Daily  . gabapentin  100 mg Oral BID  . hydrALAZINE  25 mg Oral Q8H  . metoprolol tartrate  50 mg Oral BID  . multivitamin with minerals  1 tablet Oral Daily  . thiamine  100 mg Oral Daily    Continuous Infusions: . sodium chloride 75 mL/hr at 12/24/19 1536  . linezolid (ZYVOX) IV 600 mg (12/25/19 0629)  . methocarbamol (ROBAXIN) IV       LOS: 9 days     Darlin Drop, MD [facility] Pager [phone]  If 7PM-7AM, please contact night-coverage [email or web address] Password [facility] 12/25/2019, 7:42 AM

## 2019-12-26 LAB — AEROBIC/ANAEROBIC CULTURE W GRAM STAIN (SURGICAL/DEEP WOUND): Culture: NO GROWTH

## 2019-12-26 LAB — CBC
HCT: 31.5 % — ABNORMAL LOW (ref 39.0–52.0)
Hemoglobin: 10.3 g/dL — ABNORMAL LOW (ref 13.0–17.0)
MCH: 34.8 pg — ABNORMAL HIGH (ref 26.0–34.0)
MCHC: 32.7 g/dL (ref 30.0–36.0)
MCV: 106.4 fL — ABNORMAL HIGH (ref 80.0–100.0)
Platelets: 308 10*3/uL (ref 150–400)
RBC: 2.96 MIL/uL — ABNORMAL LOW (ref 4.22–5.81)
RDW: 13 % (ref 11.5–15.5)
WBC: 8.9 10*3/uL (ref 4.0–10.5)
nRBC: 0 % (ref 0.0–0.2)

## 2019-12-26 LAB — BASIC METABOLIC PANEL
Anion gap: 7 (ref 5–15)
BUN: 21 mg/dL — ABNORMAL HIGH (ref 6–20)
CO2: 21 mmol/L — ABNORMAL LOW (ref 22–32)
Calcium: 8.3 mg/dL — ABNORMAL LOW (ref 8.9–10.3)
Chloride: 103 mmol/L (ref 98–111)
Creatinine, Ser: 2.13 mg/dL — ABNORMAL HIGH (ref 0.61–1.24)
GFR calc Af Amer: 40 mL/min — ABNORMAL LOW (ref >60)
GFR calc non Af Amer: 35 mL/min — ABNORMAL LOW (ref >60)
Glucose, Bld: 98 mg/dL (ref 70–99)
Potassium: 4.9 mmol/L (ref 3.5–5.1)
Sodium: 131 mmol/L — ABNORMAL LOW (ref 135–145)

## 2019-12-26 NOTE — Plan of Care (Signed)
  Problem: Pain Managment: Goal: General experience of comfort will improve Outcome: Progressing   Problem: Safety: Goal: Ability to remain free from injury will improve Outcome: Progressing   Problem: Skin Integrity: Goal: Risk for impaired skin integrity will decrease Outcome: Progressing   

## 2019-12-26 NOTE — Progress Notes (Signed)
PROGRESS NOTE  Robert Frank OAC:[id number] DOB: [birth year] DOA: 12/15/2019 PCP: Patient, No Pcp Per  HPI/Recap of past 24 hours: 54 y.o.WM PMHxCVA and EtOH abuse who presented to the emergency department for evaluation of right knee injury after a fall. Patient has history of alcoholism. He reportedly lives in a home that is extremely dirty and run down and home health will not go see it due to safety reasons reportedly. He states he lives with his brother. Reports he has been feeling very weak and after his fall earlier today he could not get up by himself and his brother helped him get up in the bed. He has not been able to get up and ambulate or do any activities around the house. He does have chronic back pain which still bothers him but is not worse. He reports he has not drink any alcohol in a week. He had hyponatremia last week when he was admitted. Hyponatremia is most likely secondary to his alcoholism. Reviewing his chart he has chronic hyponatremia. He states he has not drink after his last discharge from the hospital. He is oriented to person place and the month but he is unsure of the day of the week.He is slow to respond and is a poor historian. He does continue to smoke. He denies having any fever. He denies any burning with urination and denies any cough or shortness of breath. He denies any loss of consciousness.  ED Course:In the emergency room patient is found to have bilateral lower extremity erythema and swelling. When compared to last week when he was admitted from the pictures in the chart his erythema and swelling is worse. He again has hyponatremia with a sodium of 122. Hospital service been asked to admit for further management  Fell morning of 12/25/19, noticed small bump on the back of his head, no other injuries noted.  Fall precautions in place.  He denies any pain.  States he falls a lot.  12/26/19: Seen and examined.  Has no new complaints.   Denies pain in his LLE, denies a headache or any new neurological symptoms.  Denies nausea or abdominal pain.  On IV antibiotics for bilateral lower extremity cellulitis with abscess of the left calf area.  Possible repeat I&D by orthopedic surgery.  Assessment/Plan: Principal Problem:   Bilateral cellulitis of lower leg Active Problems:   COPD (chronic obstructive pulmonary disease) (HCC)   Alcoholism (HCC)   Generalized weakness   Hyponatremia   Bilateral lower leg cellulitis   Essential hypertension   Anemia of chronic disease  Bilateral lower extremity cellulitis with abscess in the left calf area Patient was initially started on vancomycin and Zosyn. However due to worsening renal function and was changed over to linezolid. MRI was obtained which showedevidence for cellulitis but also shows an abscess in the left lower leg.  Orthopedics following Patient was taken to the OR 7/14 for I&D. Plan is for repeat I&D  possibly this week. continue linezolid  Hyponatremia, improving Hypervolemic on today's exam Off IV fluid Serum sodium 131 Nephrology assisting with the management.  Nonoliguric acute kidney injury Baseline creatinine is normal.Acute renal failure likely multifactorial, in the setting of infection, ACE inhibitor, IV vancomycin. Downtrending creatinine 2.1 from 2.2 1.0 L urine output recorded in the last 24-hour Continue to avoid nephrotoxic agents and hypotension Echocardiogram showed normal systolic function. Appreciate nephrology's assistance Repeat BMP in the morning  History of COPD Stable  Essential hypertension Currently stable we will continue  monitor closely  lisinopril has been held due to AKI. Continue metoprolol and hydralazine  Avoid hypotension  Alcohol abuse No evidence of alcohol withdrawal at the time of this visit Continue on CIWA protocol, folic acid, thiamine  Anemia of chronic disease Anemia panel reviewed shows ferritin  754, TIBC 182, B12 301, folate 17.8. Hemoglobin is stable.  No evidence of overt bleeding  Continue to monitor   Ambulatory dysfunction with fall Fell on 12/25/2019 Noticed small bump on the head, no other injuries noted.  Vital signs taken and are stable.  Denies any headache or any neurological changes We will continue to monitor.   Fall precautions in place. PT recommended SNF TOC assisting with SNF placement. Continue PT OT with assistance and fall precautions   DVT prophylaxis:Lovenox subcu daily Code Status:Full code Family Communication: None at bedside  Status is: Inpatient  Remains inpatient appropriate because:IV treatments appropriate due to intensity of illness.   Dispo:             Patient From: Home             Planned Disposition: SNF             Expected discharge date: TBD as he will be going to OR next week. Still with electrolyte abnormalities.             Medically stable for discharge: No, pending repeated vitamin D level diabetic surgery.  Currently on IV antibiotics for left lower extremity abscess.     Objective: Vitals:   12/25/19 1944 12/26/19 0344 12/26/19 0500 12/26/19 0802  BP: (!) 161/92 140/85  (!) 154/103  Pulse: 94 93  95  Resp: 16 16  17   Temp: 98.7 F (37.1 C) 99.3 F (37.4 C)  97.7 F (36.5 C)  TempSrc: Oral Oral  Oral  SpO2: 99% 95%  94%  Weight:   118.9 kg   Height:   5\' 7"  (1.702 m)     Intake/Output Summary (Last 24 hours) at 12/26/2019 1248 Last data filed at 12/26/2019 0631 Gross per 24 hour  Intake 851.08 ml  Output 1020 ml  Net -168.92 ml   Filed Weights   12/16/19 1154 12/21/19 1616 12/26/19 0500  Weight: 101.5 kg 101.5 kg 118.9 kg    Exam:  . General: 54 y.o. year-old male well-developed well-nourished in no acute distress.  Alert and oriented x3.   . Cardiovascular: Regular rate and rhythm no rubs or gallops.   Marland Kitchen. Respiratory: Clear to auscultation no wheezes or rales. . Abdomen: Obese nontender  normal bowel sounds present. . Musculoskeletal: 1+ pitting edema in lower extremities bilaterally.  Very dry skin affecting lower extremities bilaterally.  Marland Kitchen. Psychiatry: Mood is appropriate for condition and setting.  Data Reviewed: CBC: Recent Labs  Lab 12/20/19 0418 12/20/19 0418 12/21/19 0207 12/21/19 0207 12/22/19 0301 12/23/19 [id number] 12/24/19 0818 12/25/19 0254 12/26/19 0139  WBC 7.7   < > 7.2   < > 7.8 6.8 7.3 8.7 8.9  NEUTROABS 4.4  --  3.6  --  4.0  --   --   --   --   HGB 11.8*   < > 11.1*   < > 11.2* 10.8* 10.9* 10.6* 10.3*  HCT 35.4*   < > 33.2*   < > 34.0* 32.7* 34.1* 33.1* 31.5*  MCV 104.4*   < > 104.7*   < > 106.9* 106.9* 106.2* 108.5* 106.4*  PLT 277   < > 263   < > 287 291  273 299 308   < > = values in this interval not displayed.   Basic Metabolic Panel: Recent Labs  Lab 12/20/19 0418 12/20/19 0418 12/21/19 0207 12/21/19 0207 12/22/19 0301 12/23/19 0737 12/24/19 0818 12/25/19 0254 12/26/19 0139  NA 129*   < > 126*   < > 126* 128* 129* 131* 131*  K 4.4   < > 4.5   < > 5.1 4.7 4.9 5.1 4.9  CL 99   < > 96*   < > 96* 101 101 101 103  CO2 22   < > 21*   < > 24 20* 19* 23 21*  GLUCOSE 101*   < > 95   < > 94 98 95 100* 98  BUN 19   < > 21*   < > 27* 28* 26* 24* 21*  CREATININE 1.90*   < > 2.16*   < > 2.80* 2.75* 2.31* 2.23* 2.13*  CALCIUM 8.0*   < > 8.1*   < > 8.2* 7.9* 8.2* 8.4* 8.3*  MG 1.8  --  1.8  --  1.9  --   --   --   --   PHOS 4.8*  --  4.1  --  5.0*  --   --   --   --    < > = values in this interval not displayed.   GFR: Estimated Creatinine Clearance: 50 mL/min (A) (by C-G formula based on SCr of 2.13 mg/dL (H)). Liver Function Tests: Recent Labs  Lab 12/20/19 0418 12/21/19 0207 12/22/19 0301  AST 29 25 30   ALT 27 20 18   ALKPHOS 39 36* 38  BILITOT 0.2* 0.5 0.5  PROT 7.1 7.0 6.8  ALBUMIN 1.9* 1.8* 1.9*   No results for input(s): LIPASE, AMYLASE in the last 168 hours. No results for input(s): AMMONIA in the last 168  hours. Coagulation Profile: No results for input(s): INR, PROTIME in the last 168 hours. Cardiac Enzymes: No results for input(s): CKTOTAL, CKMB, CKMBINDEX, TROPONINI in the last 168 hours. BNP (last 3 results) No results for input(s): PROBNP in the last 8760 hours. HbA1C: No results for input(s): HGBA1C in the last 72 hours. CBG: No results for input(s): GLUCAP in the last 168 hours. Lipid Profile: No results for input(s): CHOL, HDL, LDLCALC, TRIG, CHOLHDL, LDLDIRECT in the last 72 hours. Thyroid Function Tests: No results for input(s): TSH, T4TOTAL, FREET4, T3FREE, THYROIDAB in the last 72 hours. Anemia Panel: No results for input(s): VITAMINB12, FOLATE, FERRITIN, TIBC, IRON, RETICCTPCT in the last 72 hours. Urine analysis:    Component Value Date/Time   COLORURINE YELLOW 12/19/2019 1800   APPEARANCEUR HAZY (A) 12/19/2019 1800   LABSPEC 1.008 12/19/2019 1800   PHURINE 5.0 12/19/2019 1800   GLUCOSEU NEGATIVE 12/19/2019 1800   HGBUR MODERATE (A) 12/19/2019 1800   BILIRUBINUR NEGATIVE 12/19/2019 1800   KETONESUR NEGATIVE 12/19/2019 1800   PROTEINUR 30 (A) 12/19/2019 1800   NITRITE NEGATIVE 12/19/2019 1800   LEUKOCYTESUR NEGATIVE 12/19/2019 1800   Sepsis Labs: @LABRCNTIP (procalcitonin:4,lacticidven:4)  ) Recent Results (from the past 240 hour(s))  Culture, Urine     Status: Abnormal   Collection Time: 12/19/19  3:55 PM   Specimen: Urine, Clean Catch  Result Value Ref Range Status   Specimen Description URINE, CLEAN CATCH  Final   Special Requests NONE  Final   Culture (A)  Final    <10,000 COLONIES/mL INSIGNIFICANT GROWTH Performed at [facility], [address], [place], [facility] [address]  Report Status 12/21/2019 FINAL  Final  Aerobic/Anaerobic Culture (surgical/deep wound)     Status: None   Collection Time: 12/21/19  6:03 PM   Specimen: Abscess  Result Value Ref Range Status   Specimen Description ABSCESS LEFT CALF  Final   Special Requests TISSUE   Final   Gram Stain   Final    RARE WBC PRESENT, PREDOMINANTLY MONONUCLEAR RARE GRAM POSITIVE COCCI IN PAIRS    Culture   Final    No growth aerobically or anaerobically. Performed at [facility], [address], [place], [place] [address]    Report Status 12/26/2019 FINAL  Final      Studies: No results found.  Scheduled Meds: . aspirin EC  81 mg Oral Daily  . docusate sodium  100 mg Oral BID  . enoxaparin (LOVENOX) injection  50 mg Subcutaneous Q24H  . folic acid  1 mg Oral Daily  . gabapentin  100 mg Oral BID  . hydrALAZINE  25 mg Oral Q8H  . metoprolol tartrate  50 mg Oral BID  . multivitamin with minerals  1 tablet Oral Daily  . thiamine  100 mg Oral Daily    Continuous Infusions: . sodium chloride Stopped (12/25/19 1734)  . linezolid (ZYVOX) IV 600 mg (12/26/19 0549)  . methocarbamol (ROBAXIN) IV       LOS: 10 days     Darlin Drop, MD [facility] Pager [phone]  If 7PM-7AM, please contact night-coverage [email or web address] Password [facility] 12/26/2019, 12:48 PM

## 2019-12-26 NOTE — Progress Notes (Signed)
Admit: 12/15/2019 LOS: 10  50M AKI from likely ACEi + Acute illness + hypoalbuminemia; bilateral cellulitis with soft tissue abscess; heavy alcohol use; chronic hyponatremia relatively stable  Subjective:  . Urine output 1L, edematous. No complaints.  07/18 0701 - 07/19 0700 In: 7027.9 [P.O.:1164; I.V.:3082.3; IV Piggyback:2781.5] Out: 1020 [Urine:1020]  Filed Weights   12/16/19 1154 12/21/19 1616 12/26/19 0500  Weight: 101.5 kg 101.5 kg 118.9 kg    Scheduled Meds: . aspirin EC  81 mg Oral Daily  . docusate sodium  100 mg Oral BID  . enoxaparin (LOVENOX) injection  50 mg Subcutaneous Q24H  . folic acid  1 mg Oral Daily  . gabapentin  100 mg Oral BID  . hydrALAZINE  25 mg Oral Q8H  . metoprolol tartrate  50 mg Oral BID  . multivitamin with minerals  1 tablet Oral Daily  . thiamine  100 mg Oral Daily   Continuous Infusions: . sodium chloride Stopped (12/25/19 1734)  . linezolid (ZYVOX) IV 600 mg (12/26/19 0549)  . methocarbamol (ROBAXIN) IV     PRN Meds:.acetaminophen **OR** acetaminophen, hydrALAZINE, HYDROmorphone (DILAUDID) injection, ipratropium-albuterol, liver oil-zinc oxide, methocarbamol **OR** methocarbamol (ROBAXIN) IV, metoCLOPramide **OR** metoCLOPramide (REGLAN) injection, ondansetron **OR** ondansetron (ZOFRAN) IV, oxyCODONE, oxyCODONE, polyethylene glycol, promethazine  Current Labs: reviewed    Physical Exam:  Blood pressure (!) 154/103, pulse 95, temperature 97.7 F (36.5 C), temperature source Oral, resp. rate 17, height 5\' 7"  (1.702 m), weight 118.9 kg, SpO2 94 %. GEN: , NAD ENT: NCAT EYES: EOMI CV: Regular, normal S1 and S2 PULM: CTA B ABD: Obese, soft SKIN: Bilateral lower extremities bandaged EXT: 2+ edema bilaterally up to thighs Neuro: no deficits    Recent Labs  Lab 12/20/19 0418 12/20/19 0418 12/21/19 0207 12/21/19 0207 12/22/19 0301 12/23/19 0737 12/24/19 0818 12/25/19 0254 12/26/19 0139  NA 129*   < > 126*   < > 126*   < > 129*  131* 131*  K 4.4   < > 4.5   < > 5.1   < > 4.9 5.1 4.9  CL 99   < > 96*   < > 96*   < > 101 101 103  CO2 22   < > 21*   < > 24   < > 19* 23 21*  GLUCOSE 101*   < > 95   < > 94   < > 95 100* 98  BUN 19   < > 21*   < > 27*   < > 26* 24* 21*  CREATININE 1.90*   < > 2.16*   < > 2.80*   < > 2.31* 2.23* 2.13*  CALCIUM 8.0*   < > 8.1*   < > 8.2*   < > 8.2* 8.4* 8.3*  PHOS 4.8*  --  4.1  --  5.0*  --   --   --   --    < > = values in this interval not displayed.   Recent Labs  Lab 12/20/19 0418 12/20/19 0418 12/21/19 0207 12/21/19 0207 12/22/19 0301 12/23/19 0737 12/24/19 0818 12/25/19 0254 12/26/19 0139  WBC 7.7   < > 7.2   < > 7.8   < > 7.3 8.7 8.9  NEUTROABS 4.4  --  3.6  --  4.0  --   --   --   --   HGB 11.8*   < > 11.1*   < > 11.2*   < > 10.9* 10.6* 10.3*  HCT 35.4*   < >  33.2*   < > 34.0*   < > 34.1* 33.1* 31.5*  MCV 104.4*   < > 104.7*   < > 106.9*   < > 106.2* 108.5* 106.4*  PLT 277   < > 263   < > 287   < > 273 299 308   < > = values in this interval not displayed.    A 1. Resolving nonoliguric AKI, likely multifactorial from ACE inhibitor, acute infectious illness, hypoalbuminemia; nonoliguric. Renal ultrasound reassuring.  UA without features of GN.  Urine sodium was undetectable suggestive of impaired renal perfusion, improved with IVFs.  2. Bilateral lower extremity cellulitis on linezolid; soft tissue abscess status post I&D, plan to return to the OR the near future; per orthopedics and TRH 3. Heavy alcohol use, no drinking since last discharge 4. Hypertension, blood pressures fairly stable 5. Chronic hyponatremia (stable), likely due to low solute intake with alcohol use; fairly stable, continue to monitor 6. Hypoalbuminemia  P . Stable renal . Peak cr 2.8 now 2.1 . Encourage protein and solute intake (ensures) . Cont supportive care . Daily weights, Daily Renal Panel, Strict I/Os, Avoid nephrotoxins (NSAIDs, judicious IV Contrast)    Anthony Sar, MD [facility] 12/26/2019, 2:34 PM

## 2019-12-26 NOTE — Plan of Care (Signed)
  Problem: Education: Goal: Knowledge of General Education information will improve Description: Including pain rating scale, medication(s)/side effects and non-pharmacologic comfort measures Outcome: Progressing   Problem: Health Behavior/Discharge Planning: Goal: Ability to manage health-related needs will improve Outcome: Progressing   Problem: Clinical Measurements: Goal: Will remain free from infection Outcome: Progressing   Problem: Safety: Goal: Ability to remain free from injury will improve Outcome: Progressing   Problem: Skin Integrity: Goal: Risk for impaired skin integrity will decrease Outcome: Progressing   

## 2019-12-27 ENCOUNTER — Other Ambulatory Visit: Payer: Self-pay | Admitting: Physician Assistant

## 2019-12-27 LAB — CBC
HCT: 33.2 % — ABNORMAL LOW (ref 39.0–52.0)
Hemoglobin: 10.6 g/dL — ABNORMAL LOW (ref 13.0–17.0)
MCH: 33.7 pg (ref 26.0–34.0)
MCHC: 31.9 g/dL (ref 30.0–36.0)
MCV: 105.4 fL — ABNORMAL HIGH (ref 80.0–100.0)
Platelets: 281 10*3/uL (ref 150–400)
RBC: 3.15 MIL/uL — ABNORMAL LOW (ref 4.22–5.81)
RDW: 13.2 % (ref 11.5–15.5)
WBC: 8.8 10*3/uL (ref 4.0–10.5)
nRBC: 0 % (ref 0.0–0.2)

## 2019-12-27 LAB — BASIC METABOLIC PANEL
Anion gap: 9 (ref 5–15)
BUN: 19 mg/dL (ref 6–20)
CO2: 21 mmol/L — ABNORMAL LOW (ref 22–32)
Calcium: 8.5 mg/dL — ABNORMAL LOW (ref 8.9–10.3)
Chloride: 104 mmol/L (ref 98–111)
Creatinine, Ser: 2.12 mg/dL — ABNORMAL HIGH (ref 0.61–1.24)
GFR calc Af Amer: 40 mL/min — ABNORMAL LOW (ref >60)
GFR calc non Af Amer: 35 mL/min — ABNORMAL LOW (ref >60)
Glucose, Bld: 107 mg/dL — ABNORMAL HIGH (ref 70–99)
Potassium: 4.7 mmol/L (ref 3.5–5.1)
Sodium: 134 mmol/L — ABNORMAL LOW (ref 135–145)

## 2019-12-27 MED ORDER — HYDRALAZINE HCL 50 MG PO TABS
50.0000 mg | ORAL_TABLET | Freq: Three times a day (TID) | ORAL | Status: DC
  Administered 2019-12-27 – 2020-01-04 (×23): 50 mg via ORAL
  Filled 2019-12-27 (×24): qty 1

## 2019-12-27 MED ORDER — HYDRALAZINE HCL 25 MG PO TABS
25.0000 mg | ORAL_TABLET | Freq: Once | ORAL | Status: AC
  Administered 2019-12-27: 25 mg via ORAL
  Filled 2019-12-27: qty 1

## 2019-12-27 NOTE — Progress Notes (Signed)
Pt refused wound care to bilateral lower extremities. Per stated " the dressing was changed this mornin."

## 2019-12-27 NOTE — Progress Notes (Signed)
Patient is alert and awake lying in bed this morning.  He was to have a repeat irrigation and debridement of his left calf wound on Friday but unfortunately ate breakfast.  I had a discussion with him this morning that he could go forward with surgery tomorrow if he remained n.p.o. and could do that.  The alternative would be to continue with dressing changes.  Patient voiced that he would like to have repeat surgery and understands that he is not to eat anything unless instructed to by one of the nursing staff as part of his preop preparation

## 2019-12-27 NOTE — Plan of Care (Signed)

## 2019-12-27 NOTE — Progress Notes (Signed)
Admit: 12/15/2019 LOS: 11  68M AKI from likely ACEi + Acute illness + hypoalbuminemia; bilateral cellulitis with soft tissue abscess; heavy alcohol use; chronic hyponatremia relatively stable  Subjective:  . No complaints, has been drinking ensure for protein.  Denies fevers, chest pain, orthopnea, shortness of breath.  Urine output 1.3 L  07/19 0701 - 07/20 0700 In: 462 [P.O.:462] Out: 1300 [Urine:1300]  Filed Weights   12/26/19 0500 12/26/19 1800 12/27/19 0500  Weight: 118.9 kg 119.8 kg 116.5 kg    Scheduled Meds: . aspirin EC  81 mg Oral Daily  . docusate sodium  100 mg Oral BID  . enoxaparin (LOVENOX) injection  50 mg Subcutaneous Q24H  . folic acid  1 mg Oral Daily  . gabapentin  100 mg Oral BID  . hydrALAZINE  50 mg Oral Q8H  . metoprolol tartrate  50 mg Oral BID  . multivitamin with minerals  1 tablet Oral Daily  . thiamine  100 mg Oral Daily   Continuous Infusions: . sodium chloride Stopped (12/25/19 1734)  . linezolid (ZYVOX) IV 600 mg (12/27/19 0549)  . methocarbamol (ROBAXIN) IV     PRN Meds:.acetaminophen **OR** acetaminophen, hydrALAZINE, HYDROmorphone (DILAUDID) injection, ipratropium-albuterol, liver oil-zinc oxide, methocarbamol **OR** methocarbamol (ROBAXIN) IV, metoCLOPramide **OR** metoCLOPramide (REGLAN) injection, ondansetron **OR** ondansetron (ZOFRAN) IV, oxyCODONE, oxyCODONE, polyethylene glycol, promethazine  Current Labs: reviewed    Physical Exam:  Blood pressure (!) 167/103, pulse 92, temperature 98.6 F (37 C), temperature source Oral, resp. rate 16, height 5\' 7"  (1.702 m), weight 116.5 kg, SpO2 94 %. GEN: , NAD ENT: NCAT EYES: EOMI CV: Regular, normal S1 and S2 PULM: CTA B ABD: Obese, soft SKIN: Bilateral lower extremities bandaged EXT: 2+ edema bilaterally up to thighs Neuro: no deficits    Recent Labs  Lab 12/21/19 0207 12/21/19 0207 12/22/19 0301 12/23/19 0737 12/25/19 0254 12/26/19 0139 12/27/19 0455  NA 126*   < > 126*   <  > 131* 131* 134*  K 4.5   < > 5.1   < > 5.1 4.9 4.7  CL 96*   < > 96*   < > 101 103 104  CO2 21*   < > 24   < > 23 21* 21*  GLUCOSE 95   < > 94   < > 100* 98 107*  BUN 21*   < > 27*   < > 24* 21* 19  CREATININE 2.16*   < > 2.80*   < > 2.23* 2.13* 2.12*  CALCIUM 8.1*   < > 8.2*   < > 8.4* 8.3* 8.5*  PHOS 4.1  --  5.0*  --   --   --   --    < > = values in this interval not displayed.   Recent Labs  Lab 12/21/19 0207 12/21/19 0207 12/22/19 0301 12/23/19 0737 12/25/19 0254 12/26/19 0139 12/27/19 0455  WBC 7.2   < > 7.8   < > 8.7 8.9 8.8  NEUTROABS 3.6  --  4.0  --   --   --   --   HGB 11.1*   < > 11.2*   < > 10.6* 10.3* 10.6*  HCT 33.2*   < > 34.0*   < > 33.1* 31.5* 33.2*  MCV 104.7*   < > 106.9*   < > 108.5* 106.4* 105.4*  PLT 263   < > 287   < > 299 308 281   < > = values in this interval not displayed.  A 1. Resolving nonoliguric AKI, likely multifactorial from ACE inhibitor, acute infectious illness, hypoalbuminemia; nonoliguric. Renal ultrasound reassuring.  UA without features of GN.  Urine sodium was undetectable suggestive of impaired renal perfusion, improved with IVFs.  2. Bilateral lower extremity cellulitis on linezolid; soft tissue abscess status post I&D, plan to return to the OR this week; per orthopedics and TRH 3. Heavy alcohol use, no drinking since last discharge 4. Hypertension, blood pressures fairly stable 5. Chronic hyponatremia (stable), likely due to low solute intake with alcohol use; fairly stable, continue to monitor 6. Hypoalbuminemia 7. Lower extremity edema secondary to hypoalbuminemia and IVF's, stable  P . Stable renal fntn, now cr 2.1. Peak cr 2.8. . Encourage protein and solute intake (has been drinking ensure) . Cont supportive care . Daily weights, Daily Renal Panel, Strict I/Os, Avoid nephrotoxins (NSAIDs, judicious IV Contrast)    Anthony Sar, MD [facility]

## 2019-12-27 NOTE — Progress Notes (Signed)
Physical Therapy Treatment Patient Details Name: Robert Frank MRN: [id number] DOB: 1965-11-13 Today's Date: 12/27/2019    History of Present Illness 54 y.o. male with medical history significant of CVA and alcoholism presents to the emergency department for evaluation of right knee injury after a fall and BLE cellulitis.  Patient reports that he is having trouble walking, feels weak all over.    He has had several falls at home over the last few days, and is now s/p I&D of LLE calf abscess. He was admitted for similar complaints last week.  Patient has history of alcoholism.    PT Comments    Pt supine in bed.  Increased time to rouse and participate.  Pt recalls he is not steady on his feet and often times he does not use his RW when he should.  He was able to performed functional mobility with min to mod assistance.  Pt continues to be unsteady and will benefit greatly from snf placement to improve strength and function.    Of note:  R hand swollen and warm to touch.  Also R forearm is warm to touch.  Pt reports more pain in dorsal aspect of hand.  Informed RN and he will remove IV.       Follow Up Recommendations  SNF     Equipment Recommendations  Wheelchair (measurements PT);Wheelchair cushion (measurements PT)    Recommendations for Other Services       Precautions / Restrictions Precautions Precautions: Fall Precaution Comments: multiple wounds on buttocks and LEs    Mobility  Bed Mobility Overal bed mobility: Needs Assistance Bed Mobility: Supine to Sit;Sit to Supine     Supine to sit: HOB elevated;Supervision Sit to supine: Min assist;HOB elevated   General bed mobility comments: Pt able to move slowly to edge of bed with heavy use of bed rail.  Increased work of breathing noted.  Min assistance to lift RLE into bed.  Transfers Overall transfer level: Needs assistance Equipment used: Rolling walker (2 wheeled) Transfers: Sit to/from Stand Sit to Stand: Min  assist;Mod assist         General transfer comment: Min from elevated bed height and mod from standard seat height.  Ambulation/Gait Ambulation/Gait assistance: Min assist Gait Distance (Feet): 150 Feet Assistive device: Rolling walker (2 wheeled) Gait Pattern/deviations: Step-through pattern;Decreased stride length;Decreased dorsiflexion - right;Decreased dorsiflexion - left;Trunk flexed     General Gait Details: Cues for walker proximity, minA for stability. Decreased bilateral foot clearance, benefits from cues for positioning in RW, 2/4 DOE but VSS, SPO2 RA 94%.  Gt is very taxing on patient.   Stairs             Wheelchair Mobility    Modified Rankin (Stroke Patients Only)       Balance Overall balance assessment: Needs assistance Sitting-balance support: Feet supported;No upper extremity supported Sitting balance-Leahy Scale: Good       Standing balance-Leahy Scale: Poor Standing balance comment: reliant on BUE support of RW and minA                            Cognition Arousal/Alertness: Awake/alert Behavior During Therapy: Flat affect Overall Cognitive Status: No family/caregiver present to determine baseline cognitive functioning Area of Impairment: Safety/judgement;Problem solving;Awareness                   Current Attention Level: Sustained Memory: Decreased recall of precautions Following Commands: Follows one step  commands consistently Safety/Judgement: Decreased awareness of safety;Decreased awareness of deficits Awareness: Emergent Problem Solving: Slow processing;Requires verbal cues General Comments: pt with generally flat affect, remembers prior PT session, precautions      Exercises      General Comments        Pertinent Vitals/Pain Pain Assessment: 0-10 Pain Score: 6  Pain Location: L calf, R hand and forearm Pain Descriptors / Indicators: Grimacing;Guarding;Burning Pain Intervention(s): Monitored during  session;Repositioned (informed nursing.)    Home Living                      Prior Function            PT Goals (current goals can now be found in the care plan section) Acute Rehab PT Goals Patient Stated Goal: Go home Potential to Achieve Goals: Fair Progress towards PT goals: Progressing toward goals    Frequency    Min 2X/week      PT Plan Current plan remains appropriate    Co-evaluation              AM-PAC PT "6 Clicks" Mobility   Outcome Measure  Help needed turning from your back to your side while in a flat bed without using bedrails?: None Help needed moving from lying on your back to sitting on the side of a flat bed without using bedrails?: A Little Help needed moving to and from a bed to a chair (including a wheelchair)?: A Little Help needed standing up from a chair using your arms (e.g., wheelchair or bedside chair)?: A Lot Help needed to walk in hospital room?: A Little Help needed climbing 3-5 steps with a railing? : A Lot 6 Click Score: 17    End of Session Equipment Utilized During Treatment: Gait belt Activity Tolerance: Patient tolerated treatment well Patient left: in chair;with call bell/phone within reach;with chair alarm set Nurse Communication: Mobility status PT Visit Diagnosis: Unsteadiness on feet (R26.81);Other abnormalities of gait and mobility (R26.89);History of falling (Z91.81);Pain Pain - Right/Left:  (bilateral) Pain - part of body: Leg (BLEs)     Time: 0160-1093 PT Time Calculation (min) (ACUTE ONLY): 25 min  Charges:  $Gait Training: 8-22 mins $Therapeutic Activity: 8-22 mins                     Bonney Leitz , PTA Acute Rehabilitation Services Pager [phone] Office [phone]     Robert Frank Artis Delay 12/27/2019, 1:28 PM

## 2019-12-27 NOTE — Progress Notes (Signed)
PROGRESS NOTE  Robert Frank QQV:[id number] DOB: [birth year] DOA: 12/15/2019 PCP: Patient, No Pcp Per  HPI/Recap of past 24 hours: 54 y.o.WM PMHxCVA and EtOH abuse who presented to the emergency department for evaluation of right knee injury after a fall.   ED Course:In the emergency room patient is found to have bilateral lower extremity erythema and swelling. When compared to the previous week when he was admitted, pictures in the chart, his erythema and swelling worsened. Hyponatremia with a serum sodium of 122. Hospital service been asked to admit for further management.  Presented with left calf abscess and bilateral lower extremity cellulitis.  Had I&D done on 12/21/2019 by Dr. August Saucer.  Plan to repeat on 12/28/2019 by Dr. Lajoyce Corners.  On IV linezolid empirically.  MRSA negative on 12/06/2019.  Hospital course complicated by AKI suspect multifactorial in the setting of infection, ACE inhibitor, and IV vancomycin.  He received IV fluid hydration which was stopped due to concern for volume overload.  Fell morning of 12/25/19, noticed small bump on the back of his head, no other injuries noted.  Fall precautions in place.  He denies any pain.  States he falls a lot.  12/27/19: Seen and examined.  No new complaints this morning.  Receptive of having I&D tomorrow.  N.p.o. after midnight.   Assessment/Plan: Principal Problem:   Bilateral cellulitis of lower leg Active Problems:   COPD (chronic obstructive pulmonary disease) (HCC)   Alcoholism (HCC)   Generalized weakness   Hyponatremia   Bilateral lower leg cellulitis   Essential hypertension   Anemia of chronic disease  Bilateral lower extremity cellulitis with abscess in the left calf area Patient was initially started on vancomycin and Zosyn. However due to worsening renal function was changed over to linezolid and zosyn was stopped. MRI was obtained which showedevidence for cellulitis but also shows an abscess in the left lower  leg. Patient was taken to the OR 7/14 for I&D by Dr. August Saucer. Plan is to repeat I&D on 12/28/2019  Currently on linezolid  Hypovolemic hyponatremia, improving Hypervolemic on exam Off IV fluid Serum sodium 134 Nephrology assisting with the management.  Nonoliguric acute kidney injury Baseline creatinine is 0.6 with GFR greater than 60. Acute renal failure likely multifactorial, in the setting of infection, ACE inhibitor, IV vancomycin. Downtrending creatinine 2.1 from 2.2 1.3 L urine output recorded in the last 24-hour Continue to avoid nephrotoxic agents and hypotension Echocardiogram showed normal systolic function. Appreciate nephrology's assistance Repeat BMP in the morning  History of COPD Stable O2 saturation 94 to 98% on room  Essential hypertension BP is not at goal Increase dose of hydralazine to 50 mg 3 times daily Continue metoprolol Continue to hold off lisinopril due to AKI. Continue to monitor vital signs  Alcohol abuse No evidence of alcohol withdrawal at the time of this visit Continue on CIWA protocol Continue multivitamins, folic acid, thiamine  Anemia of chronic disease Hemoglobin uptrending 10.6 from 10.3 No evidence of overt bleeding   Ambulatory dysfunction with fall Fell on 12/25/2019 Noticed small bump on the head, no other injuries noted.  Vital signs taken and are stable.  Denies any headache or any neurological changes We will continue to monitor.   Fall precautions in place. PT recommended SNF TOC assisting with SNF placement. Continue PT OT with assistance and fall precautions  Morbid obesity BMI 40 Recommend weight loss outpatient with regular physical activity and healthy dieting   DVT prophylaxis:Lovenox subcu daily Code Status:Full code Family Communication:  None at bedside  Status is: Inpatient  Remains inpatient appropriate because:IV treatments appropriate due to intensity of illness.   Dispo:              Patient From: Home             Planned Disposition: SNF             Expected discharge date: 12/30/19             Medically stable for discharge: No, pending repeat I&D, currently on IV antibiotics for left lower extremity abscess.     Objective: Vitals:   12/26/19 2045 12/27/19 0500 12/27/19 0556 12/27/19 0747  BP: (!) 171/106  (!) 163/97 (!) 167/103  Pulse: 97  89 92  Resp: 18  16 16   Temp: 98.1 F (36.7 C)  98.3 F (36.8 C) 98.6 F (37 C)  TempSrc: Oral  Oral Oral  SpO2: 95%  98% 94%  Weight:  116.5 kg    Height:  5\' 7"  (1.702 m)      Intake/Output Summary (Last 24 hours) at 12/27/2019 1254 Last data filed at 12/27/2019 1100 Gross per 24 hour  Intake 702 ml  Output 2100 ml  Net -1398 ml   Filed Weights   12/26/19 0500 12/26/19 1800 12/27/19 0500  Weight: 118.9 kg 119.8 kg 116.5 kg    Exam:  . General: 54 y.o. year-old male well-nourished no acute distress.  Alert oriented x3.  Cardiovascular: Regular rate and rhythm no rubs or gallops. 12/29/19 Respiratory: Clear to station no wheezes no rales.   . Abdomen: Obese soft nontender normal bowel sounds present.   . Musculoskeletal: 1+ pitting edema in lower extremities bilaterally.  Very dry skin affecting lower extremities bilaterally.  44 Psychiatry: Mood is appropriate for condition and setting..  Data Reviewed: CBC: Recent Labs  Lab 12/21/19 0207 12/21/19 0207 12/22/19 0301 12/22/19 0301 12/23/19 12/24/19 12/24/19 0818 12/25/19 0254 12/26/19 0139 12/27/19 0455  WBC 7.2   < > 7.8   < > 6.8 7.3 8.7 8.9 8.8  NEUTROABS 3.6  --  4.0  --   --   --   --   --   --   HGB 11.1*   < > 11.2*   < > 10.8* 10.9* 10.6* 10.3* 10.6*  HCT 33.2*   < > 34.0*   < > 32.7* 34.1* 33.1* 31.5* 33.2*  MCV 104.7*   < > 106.9*   < > 106.9* 106.2* 108.5* 106.4* 105.4*  PLT 263   < > 287   < > 291 273 299 308 281   < > = values in this interval not displayed.   Basic Metabolic Panel: Recent Labs  Lab 12/21/19 0207 12/21/19 0207  12/22/19 0301 12/22/19 0301 12/23/19 12/24/19 12/24/19 0818 12/25/19 0254 12/26/19 0139 12/27/19 0455  NA 126*   < > 126*   < > 128* 129* 131* 131* 134*  K 4.5   < > 5.1   < > 4.7 4.9 5.1 4.9 4.7  CL 96*   < > 96*   < > 101 101 101 103 104  CO2 21*   < > 24   < > 20* 19* 23 21* 21*  GLUCOSE 95   < > 94   < > 98 95 100* 98 107*  BUN 21*   < > 27*   < > 28* 26* 24* 21* 19  CREATININE 2.16*   < > 2.80*   < > 2.75* 2.31*  2.23* 2.13* 2.12*  CALCIUM 8.1*   < > 8.2*   < > 7.9* 8.2* 8.4* 8.3* 8.5*  MG 1.8  --  1.9  --   --   --   --   --   --   PHOS 4.1  --  5.0*  --   --   --   --   --   --    < > = values in this interval not displayed.   GFR: Estimated Creatinine Clearance: 49.8 mL/min (A) (by C-G formula based on SCr of 2.12 mg/dL (H)). Liver Function Tests: Recent Labs  Lab 12/21/19 0207 12/22/19 0301  AST 25 30  ALT 20 18  ALKPHOS 36* 38  BILITOT 0.5 0.5  PROT 7.0 6.8  ALBUMIN 1.8* 1.9*   No results for input(s): LIPASE, AMYLASE in the last 168 hours. No results for input(s): AMMONIA in the last 168 hours. Coagulation Profile: No results for input(s): INR, PROTIME in the last 168 hours. Cardiac Enzymes: No results for input(s): CKTOTAL, CKMB, CKMBINDEX, TROPONINI in the last 168 hours. BNP (last 3 results) No results for input(s): PROBNP in the last 8760 hours. HbA1C: No results for input(s): HGBA1C in the last 72 hours. CBG: No results for input(s): GLUCAP in the last 168 hours. Lipid Profile: No results for input(s): CHOL, HDL, LDLCALC, TRIG, CHOLHDL, LDLDIRECT in the last 72 hours. Thyroid Function Tests: No results for input(s): TSH, T4TOTAL, FREET4, T3FREE, THYROIDAB in the last 72 hours. Anemia Panel: No results for input(s): VITAMINB12, FOLATE, FERRITIN, TIBC, IRON, RETICCTPCT in the last 72 hours. Urine analysis:    Component Value Date/Time   COLORURINE YELLOW 12/19/2019 1800   APPEARANCEUR HAZY (A) 12/19/2019 1800   LABSPEC 1.008 12/19/2019 1800   PHURINE  5.0 12/19/2019 1800   GLUCOSEU NEGATIVE 12/19/2019 1800   HGBUR MODERATE (A) 12/19/2019 1800   BILIRUBINUR NEGATIVE 12/19/2019 1800   KETONESUR NEGATIVE 12/19/2019 1800   PROTEINUR 30 (A) 12/19/2019 1800   NITRITE NEGATIVE 12/19/2019 1800   LEUKOCYTESUR NEGATIVE 12/19/2019 1800   Sepsis Labs: @LABRCNTIP (procalcitonin:4,lacticidven:4)  ) Recent Results (from the past 240 hour(s))  Culture, Urine     Status: Abnormal   Collection Time: 12/19/19  3:55 PM   Specimen: Urine, Clean Catch  Result Value Ref Range Status   Specimen Description URINE, CLEAN CATCH  Final   Special Requests NONE  Final   Culture (A)  Final    <10,000 COLONIES/mL INSIGNIFICANT GROWTH Performed at [facility], [address] [place], [place] [address]    Report Status 12/21/2019 FINAL  Final  Aerobic/Anaerobic Culture (surgical/deep wound)     Status: None   Collection Time: 12/21/19  6:03 PM   Specimen: Abscess  Result Value Ref Range Status   Specimen Description ABSCESS LEFT CALF  Final   Special Requests TISSUE  Final   Gram Stain   Final    RARE WBC PRESENT, PREDOMINANTLY MONONUCLEAR RARE GRAM POSITIVE COCCI IN PAIRS    Culture   Final    No growth aerobically or anaerobically. Performed at [facility], [address] [place], [place] [address]    Report Status 12/26/2019 FINAL  Final      Studies: No results found.  Scheduled Meds: . aspirin EC  81 mg Oral Daily  . docusate sodium  100 mg Oral BID  . enoxaparin (LOVENOX) injection  50 mg Subcutaneous Q24H  . folic acid  1 mg Oral Daily  . gabapentin  100 mg Oral BID  . hydrALAZINE  50 mg Oral Q8H  . metoprolol tartrate  50 mg Oral BID  . multivitamin with minerals  1 tablet Oral Daily  . thiamine  100 mg Oral Daily    Continuous Infusions: . sodium chloride Stopped (12/25/19 1734)  . linezolid (ZYVOX) IV 600 mg (12/27/19 0549)  . methocarbamol (ROBAXIN) IV       LOS: 11 days     Darlin Drop, MD [facility] Pager [phone]  If 7PM-7AM, please contact night-coverage [email or web address] Password [facility] 12/27/2019, 12:54 PM

## 2019-12-27 NOTE — Plan of Care (Signed)
  Problem: Pain Managment: Goal: General experience of comfort will improve Outcome: Progressing   Problem: Safety: Goal: Ability to remain free from injury will improve Outcome: Progressing   Problem: Skin Integrity: Goal: Risk for impaired skin integrity will decrease Outcome: Progressing   

## 2019-12-28 ENCOUNTER — Inpatient Hospital Stay (HOSPITAL_COMMUNITY): Payer: Self-pay

## 2019-12-28 ENCOUNTER — Encounter (HOSPITAL_COMMUNITY)
Admission: EM | Disposition: A | Discharge status: Home / Self Care | Payer: Self-pay | Source: Home / Self Care | Attending: Internal Medicine

## 2019-12-28 ENCOUNTER — Encounter (HOSPITAL_COMMUNITY): Payer: Self-pay | Admitting: Family Medicine

## 2019-12-28 DIAGNOSIS — L02416 Cutaneous abscess of left lower limb: Secondary | ICD-10-CM

## 2019-12-28 HISTORY — PX: I & D EXTREMITY: SHX5045

## 2019-12-28 LAB — BASIC METABOLIC PANEL
Anion gap: 8 (ref 5–15)
BUN: 14 mg/dL (ref 6–20)
CO2: 23 mmol/L (ref 22–32)
Calcium: 8.4 mg/dL — ABNORMAL LOW (ref 8.9–10.3)
Chloride: 102 mmol/L (ref 98–111)
Creatinine, Ser: 1.95 mg/dL — ABNORMAL HIGH (ref 0.61–1.24)
GFR calc Af Amer: 45 mL/min — ABNORMAL LOW (ref >60)
GFR calc non Af Amer: 38 mL/min — ABNORMAL LOW (ref >60)
Glucose, Bld: 105 mg/dL — ABNORMAL HIGH (ref 70–99)
Potassium: 4.2 mmol/L (ref 3.5–5.1)
Sodium: 133 mmol/L — ABNORMAL LOW (ref 135–145)

## 2019-12-28 LAB — CBC
HCT: 32.5 % — ABNORMAL LOW (ref 39.0–52.0)
Hemoglobin: 10.6 g/dL — ABNORMAL LOW (ref 13.0–17.0)
MCH: 34.5 pg — ABNORMAL HIGH (ref 26.0–34.0)
MCHC: 32.6 g/dL (ref 30.0–36.0)
MCV: 105.9 fL — ABNORMAL HIGH (ref 80.0–100.0)
Platelets: 259 10*3/uL (ref 150–400)
RBC: 3.07 MIL/uL — ABNORMAL LOW (ref 4.22–5.81)
RDW: 13.2 % (ref 11.5–15.5)
WBC: 8.3 10*3/uL (ref 4.0–10.5)
nRBC: 0 % (ref 0.0–0.2)

## 2019-12-28 SURGERY — IRRIGATION AND DEBRIDEMENT EXTREMITY
Anesthesia: General | Site: Leg Lower | Laterality: Left

## 2019-12-28 MED ORDER — FENTANYL CITRATE (PF) 250 MCG/5ML IJ SOLN
INTRAMUSCULAR | Status: DC | PRN
  Administered 2019-12-28: 50 ug via INTRAVENOUS

## 2019-12-28 MED ORDER — PROPOFOL 10 MG/ML IV BOLUS: INTRAVENOUS | Status: DC | PRN

## 2019-12-28 MED ORDER — FENTANYL CITRATE (PF) 250 MCG/5ML IJ SOLN
INTRAMUSCULAR | Status: AC
  Filled 2019-12-28: qty 5

## 2019-12-28 MED ORDER — ONDANSETRON HCL 4 MG/2ML IJ SOLN
INTRAMUSCULAR | Status: DC | PRN
  Administered 2019-12-28: 4 mg via INTRAVENOUS

## 2019-12-28 MED ORDER — CEFAZOLIN SODIUM-DEXTROSE 2-4 GM/100ML-% IV SOLN: 2.0000 g | INTRAVENOUS | Status: DC

## 2019-12-28 MED ORDER — PHENYLEPHRINE HCL (PRESSORS) 10 MG/ML IV SOLN
INTRAVENOUS | Status: DC | PRN
Start: 2019-12-28 — End: 2019-12-28

## 2019-12-28 MED ORDER — POVIDONE-IODINE 10 % EX SWAB: 2.0000 "application " | Freq: Once | CUTANEOUS | Status: DC

## 2019-12-28 MED ORDER — MIDAZOLAM HCL 2 MG/2ML IJ SOLN
INTRAMUSCULAR | Status: AC
  Filled 2019-12-28: qty 2

## 2019-12-28 MED ORDER — CEFAZOLIN SODIUM-DEXTROSE 2-4 GM/100ML-% IV SOLN
2.0000 g | INTRAVENOUS | Status: AC
  Administered 2019-12-28: 2 g via INTRAVENOUS
  Filled 2019-12-28: qty 100

## 2019-12-28 MED ORDER — DEXAMETHASONE SODIUM PHOSPHATE 10 MG/ML IJ SOLN
INTRAMUSCULAR | Status: DC | PRN
  Administered 2019-12-28: 5 mg via INTRAVENOUS

## 2019-12-28 MED ORDER — CHLORHEXIDINE GLUCONATE 0.12 % MT SOLN
15.0000 mL | Freq: Once | OROMUCOSAL | Status: AC
  Administered 2019-12-28: 15 mL via OROMUCOSAL
  Filled 2019-12-28: qty 15

## 2019-12-28 MED ORDER — PHENYLEPHRINE HCL (PRESSORS) 10 MG/ML IV SOLN
INTRAVENOUS | Status: DC | PRN
Start: 2019-12-28 — End: 2019-12-28
  Administered 2019-12-28: 80 ug via INTRAVENOUS
  Administered 2019-12-28: 120 ug via INTRAVENOUS
  Administered 2019-12-28 (×2): 80 ug via INTRAVENOUS

## 2019-12-28 MED ORDER — METOPROLOL TARTRATE 50 MG PO TABS
75.0000 mg | ORAL_TABLET | Freq: Two times a day (BID) | ORAL | Status: DC
  Administered 2019-12-28 – 2020-01-04 (×15): 75 mg via ORAL
  Filled 2019-12-28 (×15): qty 1

## 2019-12-28 MED ORDER — ONDANSETRON HCL 4 MG/2ML IJ SOLN: 4.0000 mg | Freq: Once | INTRAMUSCULAR | Status: DC | PRN

## 2019-12-28 MED ORDER — PROPOFOL 10 MG/ML IV BOLUS
INTRAVENOUS | Status: DC | PRN
  Administered 2019-12-28: 150 mg via INTRAVENOUS
  Administered 2019-12-28: 50 mg via INTRAVENOUS

## 2019-12-28 MED ORDER — 0.9 % SODIUM CHLORIDE (POUR BTL) OPTIME
TOPICAL | Status: DC | PRN
  Administered 2019-12-28: 1000 mL

## 2019-12-28 MED ORDER — SODIUM CHLORIDE 0.9 % IV SOLN: INTRAVENOUS | Status: DC

## 2019-12-28 MED ORDER — LACTATED RINGERS IV SOLN: INTRAVENOUS | Status: DC | PRN

## 2019-12-28 MED ORDER — HYDROMORPHONE HCL 1 MG/ML IJ SOLN
0.2500 mg | INTRAMUSCULAR | Status: DC | PRN
  Administered 2019-12-28 (×2): 0.5 mg via INTRAVENOUS

## 2019-12-28 MED ORDER — CHLORHEXIDINE GLUCONATE 4 % EX LIQD
60.0000 mL | Freq: Once | CUTANEOUS | Status: AC
  Administered 2019-12-28: 4 via TOPICAL

## 2019-12-28 MED ORDER — FENTANYL CITRATE (PF) 250 MCG/5ML IJ SOLN: INTRAMUSCULAR | Status: DC | PRN

## 2019-12-28 MED ORDER — CEFAZOLIN SODIUM-DEXTROSE 2-4 GM/100ML-% IV SOLN
2.0000 g | Freq: Four times a day (QID) | INTRAVENOUS | Status: AC
  Administered 2019-12-28 – 2019-12-29 (×3): 2 g via INTRAVENOUS
  Filled 2019-12-28 (×3): qty 100

## 2019-12-28 MED ORDER — HYDROMORPHONE HCL 1 MG/ML IJ SOLN
INTRAMUSCULAR | Status: AC
  Filled 2019-12-28: qty 1

## 2019-12-28 MED ORDER — OXYCODONE HCL 5 MG PO TABS: 5.0000 mg | ORAL_TABLET | Freq: Once | ORAL | Status: DC | PRN

## 2019-12-28 MED ORDER — OXYCODONE HCL 5 MG/5ML PO SOLN: 5.0000 mg | Freq: Once | ORAL | Status: DC | PRN

## 2019-12-28 SURGICAL SUPPLY — 34 items
BLADE SURG 21 STRL SS (BLADE) ×3 IMPLANT
BNDG COHESIVE 6X5 TAN NS LF (GAUZE/BANDAGES/DRESSINGS) ×2 IMPLANT
BNDG COHESIVE 6X5 TAN STRL LF (GAUZE/BANDAGES/DRESSINGS) IMPLANT
BNDG GAUZE ELAST 4 BULKY (GAUZE/BANDAGES/DRESSINGS) ×4 IMPLANT
COVER SURGICAL LIGHT HANDLE (MISCELLANEOUS) ×6 IMPLANT
COVER WAND RF STERILE (DRAPES) IMPLANT
DRAPE U-SHAPE 47X51 STRL (DRAPES) ×3 IMPLANT
DRSG ADAPTIC 3X8 NADH LF (GAUZE/BANDAGES/DRESSINGS) ×3 IMPLANT
DURAPREP 26ML APPLICATOR (WOUND CARE) ×3 IMPLANT
ELECT REM PT RETURN 9FT ADLT (ELECTROSURGICAL)
ELECTRODE REM PT RTRN 9FT ADLT (ELECTROSURGICAL) IMPLANT
GAUZE SPONGE 4X4 12PLY STRL (GAUZE/BANDAGES/DRESSINGS) ×3 IMPLANT
GLOVE BIOGEL PI IND STRL 9 (GLOVE) ×1 IMPLANT
GLOVE BIOGEL PI INDICATOR 9 (GLOVE) ×2
GLOVE SURG ORTHO 9.0 STRL STRW (GLOVE) ×3 IMPLANT
GOWN STRL REUS W/ TWL XL LVL3 (GOWN DISPOSABLE) ×2 IMPLANT
GOWN STRL REUS W/TWL XL LVL3 (GOWN DISPOSABLE) ×6
HANDPIECE INTERPULSE COAX TIP (DISPOSABLE)
KIT BASIN OR (CUSTOM PROCEDURE TRAY) ×3 IMPLANT
KIT TURNOVER KIT B (KITS) ×3 IMPLANT
MANIFOLD NEPTUNE II (INSTRUMENTS) ×3 IMPLANT
NS IRRIG 1000ML POUR BTL (IV SOLUTION) ×3 IMPLANT
PACK ORTHO EXTREMITY (CUSTOM PROCEDURE TRAY) ×3 IMPLANT
PAD ABD 8X10 STRL (GAUZE/BANDAGES/DRESSINGS) ×2 IMPLANT
PAD ARMBOARD 7.5X6 YLW CONV (MISCELLANEOUS) ×6 IMPLANT
SET HNDPC FAN SPRY TIP SCT (DISPOSABLE) IMPLANT
STOCKINETTE IMPERVIOUS 9X36 MD (GAUZE/BANDAGES/DRESSINGS) IMPLANT
SUT ETHILON 2 0 PSLX (SUTURE) ×3 IMPLANT
SWAB COLLECTION DEVICE MRSA (MISCELLANEOUS) ×3 IMPLANT
SWAB CULTURE ESWAB REG 1ML (MISCELLANEOUS) IMPLANT
TOWEL GREEN STERILE (TOWEL DISPOSABLE) ×3 IMPLANT
TUBE CONNECTING 12'X1/4 (SUCTIONS) ×1
TUBE CONNECTING 12X1/4 (SUCTIONS) ×2 IMPLANT
YANKAUER SUCT BULB TIP NO VENT (SUCTIONS) ×3 IMPLANT
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
## 2019-12-28 NOTE — Anesthesia Postprocedure Evaluation (Signed)
Anesthesia Post Note  Patient: Robert Frank  Procedure(s) Performed: IRRIGATION AND DEBRIDEMENT LEFT CALF ABSCESS (Left Leg Lower)     Patient location during evaluation: PACU Anesthesia Type: General Level of consciousness: awake and alert, oriented and patient cooperative Pain management: pain level controlled Vital Signs Assessment: post-procedure vital signs reviewed and stable Respiratory status: spontaneous breathing, nonlabored ventilation and respiratory function stable Cardiovascular status: blood pressure returned to baseline and stable Postop Assessment: no apparent nausea or vomiting Anesthetic complications: no   No complications documented.  Last Vitals:  Vitals:   12/28/19 1215 12/28/19 1230  BP: 126/83 118/78  Pulse: 76 75  Resp: 13 10  Temp:    SpO2: 93% 92%    Last Pain:  Vitals:   12/28/19 1215  TempSrc:   PainSc: 6                  Lannie Fields

## 2019-12-28 NOTE — Progress Notes (Signed)
Admit: 12/15/2019 LOS: 12  70M AKI from likely ACEi + Acute illness + hypoalbuminemia; bilateral cellulitis with soft tissue abscess; heavy alcohol use; chronic hyponatremia relatively stable  Subjective:  . S/p I+d today, urine output 1.7L + 1 unmeasured void. No complaints, doing better with hydration and ensure intake.  07/20 0701 - 07/21 0700 In: 720 [P.O.:720] Out: 2000 [Urine:2000]  Filed Weights   12/26/19 1800 12/27/19 0500 12/28/19 0629  Weight: 119.8 kg 116.5 kg 116.7 kg    Scheduled Meds: . aspirin EC  81 mg Oral Daily  . docusate sodium  100 mg Oral BID  . enoxaparin (LOVENOX) injection  50 mg Subcutaneous Q24H  . folic acid  1 mg Oral Daily  . gabapentin  100 mg Oral BID  . hydrALAZINE  50 mg Oral Q8H  . HYDROmorphone      . metoprolol tartrate  75 mg Oral BID  . multivitamin with minerals  1 tablet Oral Daily  . thiamine  100 mg Oral Daily   Continuous Infusions: . sodium chloride Stopped (12/25/19 1734)  . sodium chloride    .  ceFAZolin (ANCEF) IV    . linezolid (ZYVOX) IV 600 mg (12/28/19 5784)  . methocarbamol (ROBAXIN) IV     PRN Meds:.acetaminophen **OR** acetaminophen, hydrALAZINE, HYDROmorphone (DILAUDID) injection, ipratropium-albuterol, liver oil-zinc oxide, methocarbamol **OR** methocarbamol (ROBAXIN) IV, metoCLOPramide **OR** metoCLOPramide (REGLAN) injection, ondansetron **OR** ondansetron (ZOFRAN) IV, oxyCODONE, oxyCODONE, polyethylene glycol, promethazine  Current Labs: reviewed    Physical Exam:  Blood pressure 121/64, pulse 73, temperature 98.6 F (37 C), temperature source Oral, resp. rate 15, height 5' 7.01" (1.702 m), weight 116.7 kg, SpO2 94 %. GEN: , NAD ENT: NCAT EYES: EOMI CV: Regular, normal S1 and S2 PULM: CTA B ABD: Obese, soft SKIN: Bilateral lower extremities bandaged EXT: 2+ edema bilaterally up to thighs Neuro: no deficits    Recent Labs  Lab 12/22/19 0301 12/23/19 0737 12/26/19 0139 12/27/19 0455 12/28/19 0914   NA 126*   < > 131* 134* 133*  K 5.1   < > 4.9 4.7 4.2  CL 96*   < > 103 104 102  CO2 24   < > 21* 21* 23  GLUCOSE 94   < > 98 107* 105*  BUN 27*   < > 21* 19 14  CREATININE 2.80*   < > 2.13* 2.12* 1.95*  CALCIUM 8.2*   < > 8.3* 8.5* 8.4*  PHOS 5.0*  --   --   --   --    < > = values in this interval not displayed.   Recent Labs  Lab 12/22/19 0301 12/23/19 0737 12/26/19 0139 12/27/19 0455 12/28/19 0914  WBC 7.8   < > 8.9 8.8 8.3  NEUTROABS 4.0  --   --   --   --   HGB 11.2*   < > 10.3* 10.6* 10.6*  HCT 34.0*   < > 31.5* 33.2* 32.5*  MCV 106.9*   < > 106.4* 105.4* 105.9*  PLT 287   < > 308 281 259   < > = values in this interval not displayed.    A 1. Resolving/stable nonoliguric AKI, likely multifactorial from ACE inhibitor, acute infectious illness, hypoalbuminemia; nonoliguric. Renal ultrasound reassuring.  UA without features of GN.  Urine sodium was undetectable suggestive of impaired renal perfusion, improved with IVFs.  2. Bilateral lower extremity cellulitis on linezolid; soft tissue abscess status post I&D, plan to return to the OR this week; per orthopedics and TRH  3. Heavy alcohol use, no drinking since last discharge 4. Hypertension, blood pressures fairly stable 5. Chronic hyponatremia (stable), likely due to low solute intake with alcohol use; fairly stable, continue to monitor 6. Hypoalbuminemia 7. Lower extremity edema secondary to hypoalbuminemia and IVF's, stable  P . Stable renal fntn, now cr ~2. Peak cr 2.8. . Encourage protein and solute intake (has been drinking ensure) . Cont supportive care . Daily weights, Daily Renal Panel, Strict I/Os, Avoid nephrotoxins (NSAIDs, judicious IV Contrast)    Anthony Sar, MD [facility]

## 2019-12-28 NOTE — Interval H&P Note (Signed)
History and Physical Interval Note:  12/28/2019 7:04 AM  Robert Frank  has presented today for surgery, with the diagnosis of Left Calf Abscess.  The various methods of treatment have been discussed with the patient and family. After consideration of risks, benefits and other options for treatment, the patient has consented to  Procedure(s): IRRIGATION AND DEBRIDEMENT LEFT CALF ABSCESS (Left) as a surgical intervention.  The patient's history has been reviewed, patient examined, no change in status, stable for surgery.  I have reviewed the patient's chart and labs.  Questions were answered to the patient's satisfaction.     Nadara Mustard

## 2019-12-28 NOTE — Transfer of Care (Signed)
Immediate Anesthesia Transfer of Care Note  Patient: Robert Frank  Procedure(s) Performed: IRRIGATION AND DEBRIDEMENT LEFT CALF ABSCESS (Left Leg Lower)  Patient Location: PACU  Anesthesia Type:General  Level of Consciousness: drowsy  Airway & Oxygen Therapy: Patient Spontanous Breathing and Patient connected to face mask oxygen  Post-op Assessment: Report given to RN and Post -op Vital signs reviewed and stable  Post vital signs: Reviewed and stable  Last Vitals:  Vitals Value Taken Time  BP 94/54 12/28/19 1146  Temp    Pulse 73 12/28/19 1151  Resp 57 12/28/19 1151  SpO2 100 % 12/28/19 1151  Vitals shown include unvalidated device data.  Last Pain:  Vitals:   12/28/19 0726  TempSrc: Oral  PainSc:       Patients Stated Pain Goal: 3 (12/27/19 2050)  Complications: No complications documented.

## 2019-12-28 NOTE — Plan of Care (Signed)
  Problem: Education: Goal: Knowledge of General Education information will improve Description: Including pain rating scale, medication(s)/side effects and non-pharmacologic comfort measures Outcome: Progressing   Problem: Health Behavior/Discharge Planning: Goal: Ability to manage health-related needs will improve Outcome: Progressing   Problem: Clinical Measurements: Goal: Ability to maintain clinical measurements within normal limits will improve Outcome: Progressing Goal: Will remain free from infection Outcome: Progressing   Problem: Activity: Goal: Risk for activity intolerance will decrease Outcome: Progressing   Problem: Safety: Goal: Ability to remain free from injury will improve Outcome: Progressing   Problem: Skin Integrity: Goal: Risk for impaired skin integrity will decrease Outcome: Progressing   
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
## 2019-12-28 NOTE — Anesthesia Procedure Notes (Signed)
Procedure Name: Intubation Date/Time: 12/28/2019 11:16 AM Performed by: Loni Dolly, RN Pre-anesthesia Checklist: Patient identified, Emergency Drugs available, Suction available and Patient being monitored Patient Re-evaluated:Patient Re-evaluated prior to induction Oxygen Delivery Method: Circle system utilized Preoxygenation: Pre-oxygenation with 100% oxygen Induction Type: IV induction Ventilation: Mask ventilation without difficulty Tube type: Oral Placement Confirmation: ETT inserted through vocal cords under direct vision,  positive ETCO2 and breath sounds checked- equal and bilateral Dental Injury: Teeth and Oropharynx as per pre-operative assessment

## 2019-12-28 NOTE — Progress Notes (Signed)
PROGRESS NOTE  Robert SpringMichael S Frank UJW:[id number] DOB: 12-Feb-1966 DOA: 12/15/2019 PCP: Patient, No Pcp Per  HPI/Recap of past 24 hours: 54 y.o.WM PMHxCVA and EtOH abuse who presented to the emergency department for evaluation of right knee injury after a fall.   ED Course:In the emergency room patient is found to have bilateral lower extremity erythema and swelling. When compared to the previous week when he was admitted, pictures in the chart, his erythema and swelling worsened. Hyponatremia with a serum sodium of 122. Hospital service been asked to admit for further management.  Presented with left calf abscess and bilateral lower extremity cellulitis.  Had I&D done on 12/21/2019 by Dr. August Saucerean.  Plan to repeat on 12/28/2019 by Dr. Lajoyce Cornersuda.  On IV linezolid empirically.  MRSA negative on 12/06/2019.  Hospital course complicated by AKI suspect multifactorial in the setting of infection, ACE inhibitor, and IV vancomycin.  He received IV fluid hydration which was stopped due to concern for volume overload.  Fell morning of 12/25/19, noticed small bump on the back of his head, no other injuries noted.  Fall precautions in place.  He denies any pain.  States he falls a lot.  12/28/19: Seen and examined.  Reports R arm discomfort then drifts back to sleep.  Planned repeat I&D of LLE today.   Assessment/Plan: Principal Problem:   Bilateral cellulitis of lower leg Active Problems:   COPD (chronic obstructive pulmonary disease) (HCC)   Alcoholism (HCC)   Generalized weakness   Hyponatremia   Bilateral lower leg cellulitis   Essential hypertension   Anemia of chronic disease   Abscess of left lower leg  Bilateral lower extremity cellulitis with abscess in the left calf area Patient was initially started on vancomycin and Zosyn. However due to worsening renal function was changed over to linezolid and zosyn was stopped. MRI was obtained which showedevidence for cellulitis but also shows an  abscess in the left lower leg. Patient was taken to the OR 7/14 for I&D by Dr. August Saucerean. Plan is to repeat I&D on 12/28/2019  Continue linezolid  Hypovolemic hyponatremia, mild Hypervolemic on exam Has been off IV fluid. Serum sodium 134>133 Nephrology assisting with the management.  Nonoliguric acute kidney injury Baseline creatinine is 0.6 with GFR greater than 60. Acute renal failure likely multifactorial, in the setting of infection, ACE inhibitor, IV vancomycin. Downtrending creatinine 1.9 from 2.1 from 2.2 1.3 L urine output recorded in the last 24-hour Continue to avoid nephrotoxic agents and hypotension Echocardiogram showed normal systolic function. Appreciate nephrology's assistance Repeat BMP in the morning  History of COPD Stable O2 saturation 94 to 98% on room  Right upper extremity discomfort No significant edema on exam Good radial pulse Continue to monitor  Essential hypertension BP is not at goal Increase dose of metoprolol 75 mg twice daily. Continue hydralazine to 50 mg 3 times daily Continue to hold off lisinopril due to AKI. Continue to monitor vital signs  Alcohol abuse No evidence of alcohol withdrawal at the time of this visit Continue on CIWA protocol Continue multivitamins, folic acid, thiamine  Anemia of chronic disease Hemoglobin uptrending 10.6 from 10.3 No evidence of overt bleeding   Ambulatory dysfunction with fall Fell on 12/25/2019 Noticed small bump on the head, no other injuries noted.  Vital signs taken and are stable.  Denies any headache or any neurological changes We will continue to monitor.   Fall precautions in place. PT recommended SNF TOC assisting with SNF placement. Continue PT OT with  assistance and fall precautions  Morbid obesity BMI 40 Recommend weight loss outpatient with regular physical activity and healthy dieting   DVT prophylaxis:Lovenox subcu daily Code Status:Full code Family  Communication: None at bedside  Status is: Inpatient  Remains inpatient appropriate because:IV treatments appropriate due to intensity of illness.   Dispo:             Patient From: Home             Planned Disposition: SNF             Expected discharge date: 12/30/19             Medically stable for discharge: No, pending repeat I&D, currently on IV antibiotics for left lower extremity abscess.     Objective: Vitals:   12/28/19 1154 12/28/19 1200 12/28/19 1215 12/28/19 1230  BP: 107/73 122/77 126/83 118/78  Pulse: 74 72 76 75  Resp: 18 15 13 10   Temp:      TempSrc:      SpO2: 99% 100% 93% 92%  Weight:      Height:        Intake/Output Summary (Last 24 hours) at 12/28/2019 1247 Last data filed at 12/28/2019 1145 Gross per 24 hour  Intake 880 ml  Output 1220 ml  Net -340 ml   Filed Weights   12/26/19 1800 12/27/19 0500 12/28/19 0629  Weight: 119.8 kg 116.5 kg 116.7 kg    Exam:  . General: 54 y.o. year-old male obese in no acute stress.  Somnolent but easily arousable to voices.   . Cardiovascular: Regular rate and rhythm no rubs or gallops.   44 Respiratory: Clear auscultation no wheezes or rales.   . Abdomen: Obese nontender normal bowel sounds present.   . Musculoskeletal: 1+ pitting edema in lower extremities bilaterally.  Very dry skin affecting lower extremities bilaterally.  Marland Kitchen Psychiatry: Unable to assess mood due to somnolence.   Data Reviewed: CBC: Recent Labs  Lab 12/22/19 0301 12/23/19 0737 12/24/19 0818 12/25/19 0254 12/26/19 0139 12/27/19 0455 12/28/19 0914  WBC 7.8   < > 7.3 8.7 8.9 8.8 8.3  NEUTROABS 4.0  --   --   --   --   --   --   HGB 11.2*   < > 10.9* 10.6* 10.3* 10.6* 10.6*  HCT 34.0*   < > 34.1* 33.1* 31.5* 33.2* 32.5*  MCV 106.9*   < > 106.2* 108.5* 106.4* 105.4* 105.9*  PLT 287   < > 273 299 308 281 259   < > = values in this interval not displayed.   Basic Metabolic Panel: Recent Labs  Lab 12/22/19 0301 12/23/19 0737  12/24/19 0818 12/25/19 0254 12/26/19 0139 12/27/19 0455 12/28/19 0914  NA 126*   < > 129* 131* 131* 134* 133*  K 5.1   < > 4.9 5.1 4.9 4.7 4.2  CL 96*   < > 101 101 103 104 102  CO2 24   < > 19* 23 21* 21* 23  GLUCOSE 94   < > 95 100* 98 107* 105*  BUN 27*   < > 26* 24* 21* 19 14  CREATININE 2.80*   < > 2.31* 2.23* 2.13* 2.12* 1.95*  CALCIUM 8.2*   < > 8.2* 8.4* 8.3* 8.5* 8.4*  MG 1.9  --   --   --   --   --   --   PHOS 5.0*  --   --   --   --   --   --    < > =  values in this interval not displayed.   GFR: Estimated Creatinine Clearance: 54.1 mL/min (A) (by C-G formula based on SCr of 1.95 mg/dL (H)). Liver Function Tests: Recent Labs  Lab 12/22/19 0301  AST 30  ALT 18  ALKPHOS 38  BILITOT 0.5  PROT 6.8  ALBUMIN 1.9*   No results for input(s): LIPASE, AMYLASE in the last 168 hours. No results for input(s): AMMONIA in the last 168 hours. Coagulation Profile: No results for input(s): INR, PROTIME in the last 168 hours. Cardiac Enzymes: No results for input(s): CKTOTAL, CKMB, CKMBINDEX, TROPONINI in the last 168 hours. BNP (last 3 results) No results for input(s): PROBNP in the last 8760 hours. HbA1C: No results for input(s): HGBA1C in the last 72 hours. CBG: No results for input(s): GLUCAP in the last 168 hours. Lipid Profile: No results for input(s): CHOL, HDL, LDLCALC, TRIG, CHOLHDL, LDLDIRECT in the last 72 hours. Thyroid Function Tests: No results for input(s): TSH, T4TOTAL, FREET4, T3FREE, THYROIDAB in the last 72 hours. Anemia Panel: No results for input(s): VITAMINB12, FOLATE, FERRITIN, TIBC, IRON, RETICCTPCT in the last 72 hours. Urine analysis:    Component Value Date/Time   COLORURINE YELLOW 12/19/2019 1800   APPEARANCEUR HAZY (A) 12/19/2019 1800   LABSPEC 1.008 12/19/2019 1800   PHURINE 5.0 12/19/2019 1800   GLUCOSEU NEGATIVE 12/19/2019 1800   HGBUR MODERATE (A) 12/19/2019 1800   BILIRUBINUR NEGATIVE 12/19/2019 1800   KETONESUR NEGATIVE 12/19/2019  1800   PROTEINUR 30 (A) 12/19/2019 1800   NITRITE NEGATIVE 12/19/2019 1800   LEUKOCYTESUR NEGATIVE 12/19/2019 1800   Sepsis Labs: @LABRCNTIP (procalcitonin:4,lacticidven:4)  ) Recent Results (from the past 240 hour(s))  Culture, Urine     Status: Abnormal   Collection Time: 12/19/19  3:55 PM   Specimen: Urine, Clean Catch  Result Value Ref Range Status   Specimen Description URINE, CLEAN CATCH  Final   Special Requests NONE  Final   Culture (A)  Final    <10,000 COLONIES/mL INSIGNIFICANT GROWTH Performed at [facility], [address] [place], [place]    Report Status 12/21/2019 FINAL  Final  Aerobic/Anaerobic Culture (surgical/deep wound)     Status: None   Collection Time: 12/21/19  6:03 PM   Specimen: Abscess  Result Value Ref Range Status   Specimen Description ABSCESS LEFT CALF  Final   Special Requests TISSUE  Final   Gram Stain   Final    RARE WBC PRESENT, PREDOMINANTLY MONONUCLEAR RARE GRAM POSITIVE COCCI IN PAIRS    Culture   Final    No growth aerobically or anaerobically. Performed at [facility], [address] [place], [place]    Report Status 12/26/2019 FINAL  Final      Studies: No results found.  Scheduled Meds: . [MAR Hold] aspirin EC  81 mg Oral Daily  . [MAR Hold] docusate sodium  100 mg Oral BID  . [MAR Hold] enoxaparin (LOVENOX) injection  50 mg Subcutaneous Q24H  . [MAR Hold] folic acid  1 mg Oral Daily  . [MAR Hold] gabapentin  100 mg Oral BID  . [MAR Hold] hydrALAZINE  50 mg Oral Q8H  . HYDROmorphone      . [MAR Hold] metoprolol tartrate  75 mg Oral BID  . [MAR Hold] multivitamin with minerals  1 tablet Oral Daily  . povidone-iodine  2 application Topical Once  . [MAR Hold] thiamine  100 mg Oral Daily    Continuous Infusions: . sodium chloride Stopped (12/25/19 1734)  .  sodium chloride 10 mL/hr at 12/28/19 1012  . [MAR Hold] linezolid (ZYVOX) IV 600 mg (12/28/19 6222)  . [MAR Hold] methocarbamol  (ROBAXIN) IV       LOS: 12 days     Darlin Drop, MD [facility] Pager [phone]  If 7PM-7AM, please contact night-coverage [email or web address] Password [facility] 12/28/2019, 12:47 PM

## 2019-12-28 NOTE — Op Note (Signed)
12/28/2019  11:48 AM  PATIENT:  Robert Frank    PRE-OPERATIVE DIAGNOSIS:  Left Calf Abscess  POST-OPERATIVE DIAGNOSIS:  Same  PROCEDURE:  IRRIGATION AND DEBRIDEMENT LEFT CALF ABSCESS Local tissue rearrangement for wound closure 4 x 6 cm.  SURGEON:  Nadara Mustard, MD  PHYSICIAN ASSISTANT:None ANESTHESIA:   General  PREOPERATIVE INDICATIONS:  [name] is a  54 y.o. male with a diagnosis of Left Calf Abscess who failed conservative measures and elected for surgical management.    The risks benefits and alternatives were discussed with the patient preoperatively including but not limited to the risks of infection, bleeding, nerve injury, cardiopulmonary complications, the need for revision surgery, among others, and the patient was willing to proceed.  OPERATIVE IMPLANTS: None  @ENCIMAGES @  OPERATIVE FINDINGS: Good healthy granulation tissue.  OPERATIVE PROCEDURE: Patient was brought the operating room and underwent a general anesthetic.  After adequate levels anesthesia were obtained patient's left lower extremity was prepped using DuraPrep draped into a sterile field a timeout was called.  The packing was removed from the wound the wound was irrigated with normal saline a rondure and 21 blade knife were used to debride skin and soft tissue back to healthy viable margins.  This left a wound that was 4 x 6 cm.  All margins were healthy and viable.  Local tissue rearrangement was used to close the wound 4 x 6 cm.  A sterile compressive dressing was applied from the metatarsal heads to the tibial tubercle patient was extubated taken the PACU in stable condition.   DISCHARGE PLANNING:  Antibiotic duration: Continue antibiotics for 24 hours  Weightbearing: Weightbearing as tolerated on the left  Pain medication: Opioid pathway  Dressing care/ Wound VAC: Leave the compressive dressing in place for 1 week  Ambulatory devices: Walker or crutches as needed weightbearing as  tolerated.  Discharge to: Patient states he does not have electricity at home patient may need discharge to skilled nursing or discharge to home if skilled nursing is not an option.  Follow-up: In the office 1 week post operative.

## 2019-12-28 NOTE — Progress Notes (Signed)
Received pt from PACU s/p I&D to left calf Pt remains alert/oriented in no apparent disrtress. No complaints voicedl.

## 2019-12-29 ENCOUNTER — Encounter (HOSPITAL_COMMUNITY): Payer: Self-pay | Admitting: Orthopedic Surgery

## 2019-12-29 LAB — BASIC METABOLIC PANEL
Anion gap: 11 (ref 5–15)
BUN: 20 mg/dL (ref 6–20)
CO2: 20 mmol/L — ABNORMAL LOW (ref 22–32)
Calcium: 8.3 mg/dL — ABNORMAL LOW (ref 8.9–10.3)
Chloride: 101 mmol/L (ref 98–111)
Creatinine, Ser: 2.29 mg/dL — ABNORMAL HIGH (ref 0.61–1.24)
GFR calc Af Amer: 37 mL/min — ABNORMAL LOW (ref >60)
GFR calc non Af Amer: 32 mL/min — ABNORMAL LOW (ref >60)
Glucose, Bld: 127 mg/dL — ABNORMAL HIGH (ref 70–99)
Potassium: 4.7 mmol/L (ref 3.5–5.1)
Sodium: 132 mmol/L — ABNORMAL LOW (ref 135–145)

## 2019-12-29 LAB — CBC
HCT: 31.8 % — ABNORMAL LOW (ref 39.0–52.0)
Hemoglobin: 10.3 g/dL — ABNORMAL LOW (ref 13.0–17.0)
MCH: 34.2 pg — ABNORMAL HIGH (ref 26.0–34.0)
MCHC: 32.4 g/dL (ref 30.0–36.0)
MCV: 105.6 fL — ABNORMAL HIGH (ref 80.0–100.0)
Platelets: 247 10*3/uL (ref 150–400)
RBC: 3.01 MIL/uL — ABNORMAL LOW (ref 4.22–5.81)
RDW: 13.2 % (ref 11.5–15.5)
WBC: 7.5 10*3/uL (ref 4.0–10.5)
nRBC: 0 % (ref 0.0–0.2)

## 2019-12-29 NOTE — Progress Notes (Signed)
Physical Therapy Treatment Patient Details Name: Robert Frank MRN: [id number] DOB: 07-17-1965 Today's Date: 12/29/2019    History of Present Illness 54 y.o. male with medical history significant of CVA and alcoholism presents to the emergency department for evaluation of right knee injury after a fall and BLE cellulitis.  Patient reports that he is having trouble walking, feels weak all over.    He has had several falls at home over the last few days, and is now s/p I&D of LLE calf abscess. He was admitted for similar complaints last week.  s/p I&D 12/28/19.    PT Comments    Pt progressing slowly towards physical therapy goals. Ambulating 150 feet with a walker at a min guard assist level. Demonstrates weakness, decreased endurance, balance deficits and cognitive impairments. Presents as a high fall risk based on decreased gait speed and history of falls. Will continue to follow acutely.    Follow Up Recommendations  Home health PT;Supervision/Assistance - 24 hour     Equipment Recommendations  Wheelchair (measurements PT);Wheelchair cushion (measurements PT)    Recommendations for Other Services       Precautions / Restrictions Precautions Precautions: Fall Precaution Comments: multiple wounds on buttocks and LEs Restrictions Weight Bearing Restrictions: No    Mobility  Bed Mobility Overal bed mobility: Needs Assistance Bed Mobility: Supine to Sit     Supine to sit: Supervision     General bed mobility comments: OOB in chair  Transfers Overall transfer level: Needs assistance Equipment used: Rolling walker (2 wheeled) Transfers: Sit to/from Stand Sit to Stand: Min guard;From elevated surface Stand pivot transfers: Min guard       General transfer comment: uses momentum from elevated surface, no physical assist  Ambulation/Gait Ambulation/Gait assistance: Min guard Gait Distance (Feet): 150 Feet Assistive device: Rolling walker (2 wheeled) Gait  Pattern/deviations: Step-through pattern;Decreased stride length;Decreased dorsiflexion - right;Decreased dorsiflexion - left;Trunk flexed Gait velocity: decreased Gait velocity interpretation: <1.8 ft/sec, indicate of risk for recurrent falls General Gait Details: Good walker proximity, slow pace, decreased heel strike at initial contact, no gross imbalance. Min guard for safety. x2 short, standing rest breaks   Stairs             Wheelchair Mobility    Modified Rankin (Stroke Patients Only)       Balance Overall balance assessment: Needs assistance   Sitting balance-Leahy Scale: Good     Standing balance support: Bilateral upper extremity supported Standing balance-Leahy Scale: Poor                              Cognition Arousal/Alertness: Awake/alert Behavior During Therapy: Flat affect Overall Cognitive Status: No family/caregiver present to determine baseline cognitive functioning Area of Impairment: Attention;Following commands;Problem solving;Safety/judgement                   Current Attention Level: Sustained   Following Commands: Follows one step commands with increased time Safety/Judgement: Decreased awareness of safety   Problem Solving: Slow processing;Requires verbal cues        Exercises      General Comments        Pertinent Vitals/Pain Pain Assessment: Faces Faces Pain Scale: Hurts little more Pain Location: L LE Pain Descriptors / Indicators: Grimacing;Guarding Pain Intervention(s): Monitored during session    Home Living Family/patient expects to be discharged to:: Private residence Living Arrangements: Other relatives Available Help at Discharge: Family;Available PRN/intermittently Type of Home: House Home  Access: Stairs to enter Entrance Stairs-Rails: Can reach both Home Layout: One level Home Equipment: Walker - 2 wheels;Bedside commode      Prior Function Level of Independence: Independent with  assistive device(s)      Comments: pt reports ambulating very short household distances with RW, has fallen close to 10 times in less than a week since last admission   PT Goals (current goals can now be found in the care plan section) Acute Rehab PT Goals Patient Stated Goal: Go home PT Goal Formulation: With patient Time For Goal Achievement: 01/12/20 Potential to Achieve Goals: Fair Progress towards PT goals: Progressing toward goals    Frequency    Min 3X/week      PT Plan Discharge plan needs to be updated    Co-evaluation              AM-PAC PT "6 Clicks" Mobility   Outcome Measure  Help needed turning from your back to your side while in a flat bed without using bedrails?: None Help needed moving from lying on your back to sitting on the side of a flat bed without using bedrails?: None Help needed moving to and from a bed to a chair (including a wheelchair)?: A Little Help needed standing up from a chair using your arms (e.g., wheelchair or bedside chair)?: A Little Help needed to walk in hospital room?: A Little Help needed climbing 3-5 steps with a railing? : A Lot 6 Click Score: 19    End of Session Equipment Utilized During Treatment: Gait belt Activity Tolerance: Patient tolerated treatment well Patient left: in chair;with call bell/phone within reach;with chair alarm set Nurse Communication: Mobility status PT Visit Diagnosis: Unsteadiness on feet (R26.81);Other abnormalities of gait and mobility (R26.89);History of falling (Z91.81);Pain Pain - Right/Left: Left Pain - part of body: Leg     Time: 3154-0086 PT Time Calculation (min) (ACUTE ONLY): 14 min  Charges:  $Therapeutic Activity: 8-22 mins                       Robert Frank, PT, DPT Acute Rehabilitation Services Pager [phone] Office [phone]    Robert Frank 12/29/2019, 1:01 PM

## 2019-12-29 NOTE — Progress Notes (Signed)
Admit: 12/15/2019 LOS: 13  38M AKI from likely ACEi + Acute illness + hypoalbuminemia; bilateral cellulitis with soft tissue abscess; heavy alcohol use; chronic hyponatremia relatively stable  Subjective:  Robert Frank Reports that he is not feeling well but is being nonspecific and cannot pinpoint what is actually wrong.  He denies any fevers, chest pain, shortness of breath, orthopnea, nausea/vomiting, loss of appetite, pain at his surgical site, abdominal pain, diarrhea, dizziness, changes in vision.  He otherwise reports that he is keeping up with his hydration as well as protein intake  07/21 0701 - 07/22 0700 In: 2844.9 [P.O.:480; I.V.:553; IV Piggyback:1811.9] Out: 920 [Urine:900; Blood:20]  Filed Weights   12/26/19 1800 12/27/19 0500 12/28/19 0629  Weight: 119.8 kg 116.5 kg 116.7 kg    Scheduled Meds: . aspirin EC  81 mg Oral Daily  . docusate sodium  100 mg Oral BID  . enoxaparin (LOVENOX) injection  50 mg Subcutaneous Q24H  . folic acid  1 mg Oral Daily  . gabapentin  100 mg Oral BID  . hydrALAZINE  50 mg Oral Q8H  . metoprolol tartrate  75 mg Oral BID  . multivitamin with minerals  1 tablet Oral Daily  . thiamine  100 mg Oral Daily   Continuous Infusions: . sodium chloride Stopped (12/25/19 1734)  . sodium chloride 75 mL/hr at 12/28/19 1727  . linezolid (ZYVOX) IV 600 mg (12/29/19 0653)  . methocarbamol (ROBAXIN) IV     PRN Meds:.acetaminophen **OR** acetaminophen, hydrALAZINE, HYDROmorphone (DILAUDID) injection, ipratropium-albuterol, liver oil-zinc oxide, methocarbamol **OR** methocarbamol (ROBAXIN) IV, metoCLOPramide **OR** metoCLOPramide (REGLAN) injection, ondansetron **OR** ondansetron (ZOFRAN) IV, oxyCODONE, oxyCODONE, polyethylene glycol, promethazine  Current Labs: reviewed    Physical Exam:  Blood pressure (!) 122/87, pulse 78, temperature 98.4 F (36.9 C), temperature source Oral, resp. rate 16, height 5' 7.01" (1.702 m), weight 116.7 kg, SpO2 96 %. GEN: , NAD ENT:  NCAT EYES: EOMI CV: Regular, normal S1 and S2 PULM: CTA B ABD: Obese, soft EXT: Bilateral thighs edematous, dressings in place Neuro: no deficits    Recent Labs  Lab 12/27/19 0455 12/28/19 0914 12/29/19 0254  NA 134* 133* 132*  K 4.7 4.2 4.7  CL 104 102 101  CO2 21* 23 20*  GLUCOSE 107* 105* 127*  BUN 19 14 20   CREATININE 2.12* 1.95* 2.29*  CALCIUM 8.5* 8.4* 8.3*   Recent Labs  Lab 12/27/19 0455 12/28/19 0914 12/29/19 0254  WBC 8.8 8.3 7.5  HGB 10.6* 10.6* 10.3*  HCT 33.2* 32.5* 31.8*  MCV 105.4* 105.9* 105.6*  PLT 281 259 247    A 1. Resolving/stable nonoliguric AKI, likely multifactorial from ACE inhibitor, acute infectious illness, hypoalbuminemia; nonoliguric. Renal ultrasound reassuring.  UA without features of GN.  Urine sodium was undetectable suggestive of impaired renal perfusion, improved with IVFs.  2. Bilateral lower extremity cellulitis on linezolid; soft tissue abscess status post I&D, plan to return to the OR this week; per orthopedics and TRH 3. Heavy alcohol use, no drinking since last discharge 4. Hypertension, blood pressures fairly stable 5. Chronic hyponatremia (stable), likely due to low solute intake with alcohol use; fairly stable, continue to monitor 6. Hypoalbuminemia 7. Lower extremity edema secondary to hypoalbuminemia and IVF's, stable  P . Stable renal fntn, slightly up from 2-2.3 overall no significant change . Encourage protein and solute intake (has been drinking ensure) . Cont supportive care . Daily weights, Daily Renal Panel, Strict I/Os, Avoid nephrotoxins (NSAIDs, judicious IV Contrast)    12/31/19, MD Anthony Sar  [facility]

## 2019-12-29 NOTE — Progress Notes (Signed)
Patient is postop day #1 status post irrigation and debridement left calf.  Patient appears alert and comfortable.  Dressing is clean dry and intact.  Toes are warm.  Compartments compressible.  Patient may be discharged on antibiotics.  Should follow-up in our office in 1 week

## 2019-12-29 NOTE — Evaluation (Signed)
Occupational Therapy Evaluation Patient Details Name: Robert Frank MRN: [id number] DOB: 10/10/1965 Today's Date: 12/29/2019    History of Present Illness 54 y.o. male with medical history significant of CVA and alcoholism presents to the emergency department for evaluation of right knee injury after a fall and BLE cellulitis.  Patient reports that he is having trouble walking, feels weak all over.    He has had several falls at home over the last few days, and is now s/p I&D of LLE calf abscess. He was admitted for similar complaints last week.  s/p I&D 12/28/19.   Clinical Impression   Pt was independent with AD prior to admission. Presents with impaired cognition, generalized weakness, obesity and impaired standing balance. He requires up to min assist for ADL, primarily to reach L LE for dressing. Will follow acutely.     Follow Up Recommendations  Home health OT    Equipment Recommendations  None recommended by OT    Recommendations for Other Services       Precautions / Restrictions Precautions Precautions: Fall Precaution Comments: multiple wounds on buttocks and LEs Restrictions Weight Bearing Restrictions: No      Mobility Bed Mobility Overal bed mobility: Needs Assistance Bed Mobility: Supine to Sit     Supine to sit: Supervision        Transfers Overall transfer level: Needs assistance Equipment used: Rolling walker (2 wheeled) Transfers: Sit to/from Stand Sit to Stand: Min guard;From elevated surface Stand pivot transfers: Min guard       General transfer comment: uses momentum from elevated surface, no physical assist    Balance Overall balance assessment: Needs assistance   Sitting balance-Leahy Scale: Good     Standing balance support: Bilateral upper extremity supported Standing balance-Leahy Scale: Poor                             ADL either performed or assessed with clinical judgement   ADL Overall ADL's : Needs  assistance/impaired Eating/Feeding: Set up   Grooming: Wash/dry hands;Wash/dry face;Sitting;Set up   Upper Body Bathing: Set up;Sitting   Lower Body Bathing: Minimal assistance;Sit to/from stand   Upper Body Dressing : Set up;Sitting   Lower Body Dressing: Minimal assistance;Sitting/lateral leans   Toilet Transfer: Min guard;Stand-pivot;BSC;RW   Toileting- Clothing Manipulation and Hygiene: Minimal assistance;Sit to/from stand         General ADL Comments: pt pulls L LE up on bed to attempt to don sock, pain preventing     Vision Patient Visual Report: No change from baseline       Perception     Praxis      Pertinent Vitals/Pain Pain Assessment: Faces Faces Pain Scale: Hurts little more Pain Location: L LE Pain Descriptors / Indicators: Grimacing;Guarding Pain Intervention(s): Monitored during session;Repositioned     Hand Dominance Right   Extremity/Trunk Assessment Upper Extremity Assessment Upper Extremity Assessment: Overall WFL for tasks assessed   Lower Extremity Assessment Lower Extremity Assessment: Defer to PT evaluation   Cervical / Trunk Assessment Cervical / Trunk Assessment: Kyphotic (obese)   Communication Communication Communication: HOH   Cognition Arousal/Alertness: Awake/alert Behavior During Therapy: Flat affect Overall Cognitive Status: No family/caregiver present to determine baseline cognitive functioning Area of Impairment: Attention;Following commands;Problem solving;Safety/judgement                   Current Attention Level: Focused   Following Commands: Follows one step commands with increased time Safety/Judgement:  Decreased awareness of safety   Problem Solving: Slow processing;Requires verbal cues     General Comments       Exercises     Shoulder Instructions      Home Living Family/patient expects to be discharged to:: Private residence Living Arrangements: Other relatives Available Help at Discharge:  Family;Available PRN/intermittently Type of Home: House Home Access: Stairs to enter [employer] of Steps: 3 Entrance Stairs-Rails: Can reach both Home Layout: One level     Bathroom Shower/Tub: Chief Strategy Officer: Standard     Home Equipment: Environmental consultant - 2 wheels;Bedside commode          Prior Functioning/Environment Level of Independence: Independent with assistive device(s)        Comments: pt reports ambulating very short household distances with RW, has fallen close to 10 times in less than a week since last admission        OT Problem List: Decreased strength;Decreased activity tolerance;Impaired balance (sitting and/or standing);Decreased cognition;Decreased safety awareness;Decreased knowledge of use of DME or AE;Pain;Increased edema;Obesity      OT Treatment/Interventions: Self-care/ADL training;DME and/or AE instruction;Patient/family education;Balance training;Therapeutic activities    OT Goals(Current goals can be found in the care plan section) Acute Rehab OT Goals Patient Stated Goal: Go home OT Goal Formulation: With patient Time For Goal Achievement: 01/12/20 Potential to Achieve Goals: Good ADL Goals Pt Will Perform Grooming: with modified independence;standing Pt Will Perform Lower Body Bathing: with modified independence;sit to/from stand Pt Will Perform Lower Body Dressing: with modified independence;sit to/from stand Pt Will Transfer to Toilet: with modified independence;ambulating;bedside commode (over toilet) Pt Will Perform Toileting - Clothing Manipulation and hygiene: with modified independence;sit to/from stand  OT Frequency: Min 2X/week   Barriers to D/C:            Co-evaluation              AM-PAC OT "6 Clicks" Daily Activity     Outcome Measure Help from another person eating meals?: None Help from another person taking care of personal grooming?: A Little Help from another person toileting, which  includes using toliet, bedpan, or urinal?: A Little Help from another person bathing (including washing, rinsing, drying)?: A Little Help from another person to put on and taking off regular upper body clothing?: None Help from another person to put on and taking off regular lower body clothing?: A Little 6 Click Score: 20   End of Session Equipment Utilized During Treatment: Gait belt;Rolling walker  Activity Tolerance: Patient tolerated treatment well Patient left: in chair;with call bell/phone within reach;with chair alarm set                   Time: [phone] OT Time Calculation (min): 24 min Charges:  OT General Charges $OT Visit: 1 Visit OT Evaluation $OT Eval Moderate Complexity: 1 Mod OT Treatments $Self Care/Home Management : 8-22 mins  Martie Round, OTR/L Acute Rehabilitation Services Pager: [phone] Office: [phone]  Evern Bio 12/29/2019, 10:49 AM

## 2019-12-29 NOTE — Progress Notes (Signed)
PROGRESS NOTE  Robert Frank ASN:[id number] DOB: 10-27-1965 DOA: 12/15/2019 PCP: Patient, No Pcp Per  HPI/Recap of past 24 hours: 54 y.o.WM PMHxCVA and EtOH abuse who presented to the emergency department for evaluation of right knee injury after a fall.   ED Course:In the emergency room patient is found to have bilateral lower extremity erythema and swelling. When compared to the previous week when he was admitted, pictures in the chart, his erythema and swelling worsened. Hyponatremia with a serum sodium of 122. Hospital service been asked to admit for further management.  Presented with left calf abscess and bilateral lower extremity cellulitis.  Had I&D done on 12/21/2019 by Dr. August Saucer.  Plan to repeat on 12/28/2019 by Dr. Lajoyce Corners.  On IV linezolid empirically.  MRSA negative on 12/06/2019.  Hospital course complicated by AKI suspect multifactorial in the setting of infection, ACE inhibitor, and IV vancomycin.  He received IV fluid hydration which was stopped due to concern for volume overload.  Fell morning of 12/25/19, noticed small bump on the back of his head, no other injuries noted.  Fall precautions in place.  He denies any pain.  States he falls a lot.  12/28/19: Seen and examined.  R arm feels better. POD #1 post repeat I&D LLE   Assessment/Plan: Principal Problem:   Bilateral cellulitis of lower leg Active Problems:   COPD (chronic obstructive pulmonary disease) (HCC)   Alcoholism (HCC)   Generalized weakness   Hyponatremia   Bilateral lower leg cellulitis   Essential hypertension   Anemia of chronic disease   Abscess of left lower leg  Bilateral lower extremity cellulitis with abscess in the left calf area post I&D x 2 Patient was initially started on vancomycin and Zosyn. However due to worsening renal function was changed over to linezolid and zosyn was stopped. MRI was obtained which showedevidence for cellulitis but also shows an abscess in the left lower  leg. Patient was taken to the OR 7/14 for I&D by Dr. August Saucer. Repeat I&D on 12/28/2019  Continue linezolid  Hypovolemic hyponatremia, mild Hypervolemic on exam Has been off IV fluid. Serum sodium 134>133 Nephrology assisting with the management.  Nonoliguric acute kidney injury Baseline creatinine is 0.6 with GFR greater than 60. Acute renal failure likely multifactorial, in the setting of infection, ACE inhibitor, IV vancomycin. Downtrending creatinine 1.9 from 2.1 from 2.2 1.3 L urine output recorded in the last 24-hour Continue to avoid nephrotoxic agents and hypotension Echocardiogram showed normal systolic function. Appreciate nephrology's assistance Repeat BMP in the morning  History of COPD Stable O2 saturation 94 to 98% on room  Right upper extremity discomfort No significant edema on exam Good radial pulse Continue to monitor  Essential hypertension BP is not at goal Increase dose of metoprolol 75 mg twice daily. Continue hydralazine to 50 mg 3 times daily Continue to hold off lisinopril due to AKI. Continue to monitor vital signs  Alcohol abuse No evidence of alcohol withdrawal at the time of this visit Continue on CIWA protocol Continue multivitamins, folic acid, thiamine  Anemia of chronic disease Hemoglobin uptrending 10.6 from 10.3 No evidence of overt bleeding   Ambulatory dysfunction with fall Fell on 12/25/2019 Noticed small bump on the head, no other injuries noted.  Vital signs taken and are stable.  Denies any headache or any neurological changes We will continue to monitor.   Fall precautions in place. PT recommended SNF TOC assisting with SNF placement. Continue PT OT with assistance and fall precautions  Morbid obesity BMI 40 Recommend weight loss outpatient with regular physical activity and healthy dieting   DVT prophylaxis:Lovenox subcu daily Code Status:Full code Family Communication: None at bedside  Status is:  Inpatient  Remains inpatient appropriate because:IV treatments appropriate due to intensity of illness.   Dispo:             Patient From: Home             Planned Disposition: SNF             Expected discharge date: 12/30/19             Medically stable for discharge: No, pending repeat I&D, currently on IV antibiotics for left lower extremity abscess.     Objective: Vitals:   12/28/19 1952 12/29/19 0330 12/29/19 0745 12/29/19 1216  BP: 128/78 123/71 125/80 (!) 122/87  Pulse: 83 81 81 78  Resp: 16 17  16   Temp: 99.3 F (37.4 C) 99.1 F (37.3 C) 97.8 F (36.6 C) 98.4 F (36.9 C)  TempSrc: Oral Axillary Oral Oral  SpO2: 94% 96% 95% 96%  Weight:      Height:        Intake/Output Summary (Last 24 hours) at 12/29/2019 1638 Last data filed at 12/29/2019 1300 Gross per 24 hour  Intake 2684.86 ml  Output 1300 ml  Net 1384.86 ml   Filed Weights   12/26/19 1800 12/27/19 0500 12/28/19 0629  Weight: 119.8 kg 116.5 kg 116.7 kg    Exam: No significant changes from previous exam.  . General: 53 y.o. year-old male obese in no acute stress.  Somnolent but easily arousable to voices.   . Cardiovascular: Regular rate and rhythm no rubs or gallops.   44 Respiratory: Clear auscultation no wheezes or rales.   . Abdomen: Obese nontender normal bowel sounds present.   . Musculoskeletal: 1+ pitting edema in lower extremities bilaterally.  Very dry skin affecting lower extremities bilaterally.  Marland Kitchen Psychiatry: Unable to assess mood due to somnolence.   Data Reviewed: CBC: Recent Labs  Lab 12/25/19 0254 12/26/19 0139 12/27/19 0455 12/28/19 0914 12/29/19 0254  WBC 8.7 8.9 8.8 8.3 7.5  HGB 10.6* 10.3* 10.6* 10.6* 10.3*  HCT 33.1* 31.5* 33.2* 32.5* 31.8*  MCV 108.5* 106.4* 105.4* 105.9* 105.6*  PLT 299 308 281 259 247   Basic Metabolic Panel: Recent Labs  Lab 12/25/19 0254 12/26/19 0139 12/27/19 0455 12/28/19 0914 12/29/19 0254  NA 131* 131* 134* 133* 132*  K 5.1 4.9  4.7 4.2 4.7  CL 101 103 104 102 101  CO2 23 21* 21* 23 20*  GLUCOSE 100* 98 107* 105* 127*  BUN 24* 21* 19 14 20   CREATININE 2.23* 2.13* 2.12* 1.95* 2.29*  CALCIUM 8.4* 8.3* 8.5* 8.4* 8.3*   GFR: Estimated Creatinine Clearance: 46.1 mL/min (A) (by C-G formula based on SCr of 2.29 mg/dL (H)). Liver Function Tests: No results for input(s): AST, ALT, ALKPHOS, BILITOT, PROT, ALBUMIN in the last 168 hours. No results for input(s): LIPASE, AMYLASE in the last 168 hours. No results for input(s): AMMONIA in the last 168 hours. Coagulation Profile: No results for input(s): INR, PROTIME in the last 168 hours. Cardiac Enzymes: No results for input(s): CKTOTAL, CKMB, CKMBINDEX, TROPONINI in the last 168 hours. BNP (last 3 results) No results for input(s): PROBNP in the last 8760 hours. HbA1C: No results for input(s): HGBA1C in the last 72 hours. CBG: No results for input(s): GLUCAP in the last 168 hours. Lipid Profile: No results  for input(s): CHOL, HDL, LDLCALC, TRIG, CHOLHDL, LDLDIRECT in the last 72 hours. Thyroid Function Tests: No results for input(s): TSH, T4TOTAL, FREET4, T3FREE, THYROIDAB in the last 72 hours. Anemia Panel: No results for input(s): VITAMINB12, FOLATE, FERRITIN, TIBC, IRON, RETICCTPCT in the last 72 hours. Urine analysis:    Component Value Date/Time   COLORURINE YELLOW 12/19/2019 1800   APPEARANCEUR HAZY (A) 12/19/2019 1800   LABSPEC 1.008 12/19/2019 1800   PHURINE 5.0 12/19/2019 1800   GLUCOSEU NEGATIVE 12/19/2019 1800   HGBUR MODERATE (A) 12/19/2019 1800   BILIRUBINUR NEGATIVE 12/19/2019 1800   KETONESUR NEGATIVE 12/19/2019 1800   PROTEINUR 30 (A) 12/19/2019 1800   NITRITE NEGATIVE 12/19/2019 1800   LEUKOCYTESUR NEGATIVE 12/19/2019 1800   Sepsis Labs: @LABRCNTIP (procalcitonin:4,lacticidven:4)  ) Recent Results (from the past 240 hour(s))  Aerobic/Anaerobic Culture (surgical/deep wound)     Status: None   Collection Time: 12/21/19  6:03 PM   Specimen:  Abscess  Result Value Ref Range Status   Specimen Description ABSCESS LEFT CALF  Final   Special Requests TISSUE  Final   Gram Stain   Final    RARE WBC PRESENT, PREDOMINANTLY MONONUCLEAR RARE GRAM POSITIVE COCCI IN PAIRS    Culture   Final    No growth aerobically or anaerobically. Performed at [facility], [address], [place], [place]    Report Status 12/26/2019 FINAL  Final      Studies: No results found.  Scheduled Meds: . aspirin EC  81 mg Oral Daily  . docusate sodium  100 mg Oral BID  . enoxaparin (LOVENOX) injection  50 mg Subcutaneous Q24H  . folic acid  1 mg Oral Daily  . gabapentin  100 mg Oral BID  . hydrALAZINE  50 mg Oral Q8H  . metoprolol tartrate  75 mg Oral BID  . multivitamin with minerals  1 tablet Oral Daily  . thiamine  100 mg Oral Daily    Continuous Infusions: . sodium chloride Stopped (12/25/19 1734)  . sodium chloride 75 mL/hr at 12/28/19 1727  . linezolid (ZYVOX) IV 600 mg (12/29/19 0653)  . methocarbamol (ROBAXIN) IV       LOS: 13 days     12/31/19, MD [facility] Pager [phone]  If 7PM-7AM, please contact night-coverage [email or web address] Password [facility] 12/29/2019, 4:38 PM

## 2019-12-29 NOTE — Plan of Care (Signed)
Right LE washed per wound care orders. No blisters or open areas noted to leg. Lots of scabbing and flaky skin around foot and ankle area. Ted hose reapplied. Two stage 2 open areas on buttocks, one on right and one on left. Area cleansed and foam applied.  Problem: Health Behavior/Discharge Planning: Goal: Ability to manage health-related needs will improve Outcome: Not Progressing   Problem: Activity: Goal: Risk for activity intolerance will decrease Outcome: Progressing   Problem: Nutrition: Goal: Adequate nutrition will be maintained Outcome: Progressing

## 2019-12-30 LAB — BASIC METABOLIC PANEL
Anion gap: 9 (ref 5–15)
BUN: 25 mg/dL — ABNORMAL HIGH (ref 6–20)
CO2: 23 mmol/L (ref 22–32)
Calcium: 8.3 mg/dL — ABNORMAL LOW (ref 8.9–10.3)
Chloride: 102 mmol/L (ref 98–111)
Creatinine, Ser: 2.18 mg/dL — ABNORMAL HIGH (ref 0.61–1.24)
GFR calc Af Amer: 39 mL/min — ABNORMAL LOW (ref >60)
GFR calc non Af Amer: 34 mL/min — ABNORMAL LOW (ref >60)
Glucose, Bld: 90 mg/dL (ref 70–99)
Potassium: 4.3 mmol/L (ref 3.5–5.1)
Sodium: 134 mmol/L — ABNORMAL LOW (ref 135–145)

## 2019-12-30 LAB — CBC
HCT: 30.7 % — ABNORMAL LOW (ref 39.0–52.0)
Hemoglobin: 9.8 g/dL — ABNORMAL LOW (ref 13.0–17.0)
MCH: 33.9 pg (ref 26.0–34.0)
MCHC: 31.9 g/dL (ref 30.0–36.0)
MCV: 106.2 fL — ABNORMAL HIGH (ref 80.0–100.0)
Platelets: 214 10*3/uL (ref 150–400)
RBC: 2.89 MIL/uL — ABNORMAL LOW (ref 4.22–5.81)
RDW: 13.2 % (ref 11.5–15.5)
WBC: 7.7 10*3/uL (ref 4.0–10.5)
nRBC: 0 % (ref 0.0–0.2)

## 2019-12-30 MED ORDER — LINEZOLID 600 MG PO TABS
600.0000 mg | ORAL_TABLET | Freq: Two times a day (BID) | ORAL | Status: DC
  Administered 2019-12-30 – 2020-01-04 (×10): 600 mg via ORAL
  Filled 2019-12-30 (×11): qty 1

## 2019-12-30 NOTE — Progress Notes (Signed)
PROGRESS NOTE  Robert Frank KXF:[id number] DOB: 01-22-1966 DOA: 12/15/2019 PCP: Patient, No Pcp Per  HPI/Recap of past 24 hours: 54 y.o.WM PMHxCVA and EtOH abuse who presented to the emergency department for evaluation of right knee injury after a fall.   ED Course:In the emergency room patient is found to have bilateral lower extremity erythema and swelling. When compared to the previous week when he was admitted, pictures in the chart, his erythema and swelling worsened. Hyponatremia with a serum sodium of 122. Hospital service been asked to admit for further management.  Presented with left calf abscess and bilateral lower extremity cellulitis.  Had I&D done on 12/21/2019 by Dr. August Saucer.  Plan to repeat on 12/28/2019 by Dr. Lajoyce Corners.  On IV linezolid empirically.  MRSA negative on 12/06/2019.  Hospital course complicated by AKI suspect multifactorial in the setting of infection, ACE inhibitor, and IV vancomycin.  He received IV fluid hydration which was stopped due to concern for volume overload.  Fell morning of 12/25/19, noticed small bump on the back of his head, no other injuries noted.  Fall precautions in place.  He denies any pain.  States he falls a lot.  12/29/19: Seen and examined.  POD #1 post repeat I&D LLE. No new complaints.   Assessment/Plan: Principal Problem:   Bilateral cellulitis of lower leg Active Problems:   COPD (chronic obstructive pulmonary disease) (HCC)   Alcoholism (HCC)   Generalized weakness   Hyponatremia   Bilateral lower leg cellulitis   Essential hypertension   Anemia of chronic disease   Abscess of left lower leg  Bilateral lower extremity cellulitis with abscess in the left calf area post I&D x 2 Patient was initially started on vancomycin and Zosyn. However due to worsening renal function was changed over to linezolid and zosyn was stopped. MRI was obtained which showedevidence for cellulitis but also shows an abscess in the left lower  leg. Patient was taken to the OR 7/14 for I&D by Dr. August Saucer. Repeat I&D on 12/28/2019  IV linezolid switched to oral on 12/30/2019  Hypovolemic hyponatremia, mild Asymptomatic Serum sodium 134 on IV fluid normal saline at 75 cc/h Continue to monitor  Nonoliguric acute kidney injury Baseline creatinine is 0.6 with GFR greater than 60. Acute renal failure likely multifactorial, in the setting of infection, ACE inhibitor, IV vancomycin. Downtrending creatinine 2.18 from 2.29\ 1.5 L urine output recorded in the last 24 hours. Continue to avoid nephrotoxins and hypotension. Echocardiogram showed normal systolic function. Appreciate nephrology's assistance Repeat BMP in the morning  History of COPD Stable O2 saturation 94 to 98% on room  Resolved right upper extremity discomfort, unclear etiology No significant edema on exam  Essential hypertension Blood pressure stable Continue metoprolol 75 mg twice daily, hydralazine 50 mg 3 times daily Continue to hold off lisinopril due to AKI. Continue to monitor vital signs.  Alcohol abuse No evidence of alcohol withdrawal at the time of this visit Continue on CIWA protocol Continue multivitamins, folic acid, thiamine  Anemia of chronic disease Hemoglobin 9.8 Baseline hemoglobin 10.6  Ambulatory dysfunction with fall Fell on 12/25/2019 PT assessment recommended home health PT OT TOC assisting with home health services and disposition. Continue PT OT with assistance and fall precautions.  Morbid obesity BMI 40 Recommend weight loss outpatient with regular physical activity 30 to 60 minutes a day 5 times a week and healthy dieting   DVT prophylaxis:Lovenox subcu daily Code Status:Full code Family Communication: None at bedside  Status is: Inpatient  Dispo:             Patient From: Home             Planned Disposition: SNF             Expected discharge date: 12/31/19             Medically stable for  discharge: No, likely tomorrow, pending safe discharge arrangement.   Objective: Vitals:   12/29/19 1216 12/29/19 1927 12/30/19 0300 12/30/19 0827  BP: (!) 122/87 (!) 142/82 (!) 140/90 (!) 150/99  Pulse: 78 78 75 84  Resp: 16 15 15 17   Temp: 98.4 F (36.9 C) 97.8 F (36.6 C) 98.3 F (36.8 C) 98.4 F (36.9 C)  TempSrc: Oral Oral Axillary Oral  SpO2: 96% 94% 98% 96%  Weight:      Height:        Intake/Output Summary (Last 24 hours) at 12/30/2019 1431 Last data filed at 12/30/2019 0900 Gross per 24 hour  Intake 240 ml  Output 1950 ml  Net -1710 ml   Filed Weights   12/26/19 1800 12/27/19 0500 12/28/19 0629  Weight: 119.8 kg 116.5 kg 116.7 kg    Exam: . General: 54 y.o. year-old male obese in no acute distress.  Alert oriented x3.  . Cardiovascular: Regular rate and rhythm no rubs or gallops. 44 Respiratory: Clear to auscultation no wheezes or rales.   . Abdomen: Obese nontender normal bowel sounds present. . Musculoskeletal: Trace edema in lower extremities bilaterally.  Left lower extremity in surgical dressing. Marland Kitchen Psychiatry: Mood is appropriate for condition and setting.   Data Reviewed: CBC: Recent Labs  Lab 12/26/19 0139 12/27/19 0455 12/28/19 0914 12/29/19 0254 12/30/19 0441  WBC 8.9 8.8 8.3 7.5 7.7  HGB 10.3* 10.6* 10.6* 10.3* 9.8*  HCT 31.5* 33.2* 32.5* 31.8* 30.7*  MCV 106.4* 105.4* 105.9* 105.6* 106.2*  PLT 308 281 259 247 214   Basic Metabolic Panel: Recent Labs  Lab 12/26/19 0139 12/27/19 0455 12/28/19 0914 12/29/19 0254 12/30/19 0441  NA 131* 134* 133* 132* 134*  K 4.9 4.7 4.2 4.7 4.3  CL 103 104 102 101 102  CO2 21* 21* 23 20* 23  GLUCOSE 98 107* 105* 127* 90  BUN 21* 19 14 20  25*  CREATININE 2.13* 2.12* 1.95* 2.29* 2.18*  CALCIUM 8.3* 8.5* 8.4* 8.3* 8.3*   GFR: Estimated Creatinine Clearance: 48.4 mL/min (A) (by C-G formula based on SCr of 2.18 mg/dL (H)). Liver Function Tests: No results for input(s): AST, ALT, ALKPHOS, BILITOT,  PROT, ALBUMIN in the last 168 hours. No results for input(s): LIPASE, AMYLASE in the last 168 hours. No results for input(s): AMMONIA in the last 168 hours. Coagulation Profile: No results for input(s): INR, PROTIME in the last 168 hours. Cardiac Enzymes: No results for input(s): CKTOTAL, CKMB, CKMBINDEX, TROPONINI in the last 168 hours. BNP (last 3 results) No results for input(s): PROBNP in the last 8760 hours. HbA1C: No results for input(s): HGBA1C in the last 72 hours. CBG: No results for input(s): GLUCAP in the last 168 hours. Lipid Profile: No results for input(s): CHOL, HDL, LDLCALC, TRIG, CHOLHDL, LDLDIRECT in the last 72 hours. Thyroid Function Tests: No results for input(s): TSH, T4TOTAL, FREET4, T3FREE, THYROIDAB in the last 72 hours. Anemia Panel: No results for input(s): VITAMINB12, FOLATE, FERRITIN, TIBC, IRON, RETICCTPCT in the last 72 hours. Urine analysis:    Component Value Date/Time   COLORURINE YELLOW 12/19/2019 1800   APPEARANCEUR HAZY (A) 12/19/2019 1800  LABSPEC 1.008 12/19/2019 1800   PHURINE 5.0 12/19/2019 1800   GLUCOSEU NEGATIVE 12/19/2019 1800   HGBUR MODERATE (A) 12/19/2019 1800   BILIRUBINUR NEGATIVE 12/19/2019 1800   KETONESUR NEGATIVE 12/19/2019 1800   PROTEINUR 30 (A) 12/19/2019 1800   NITRITE NEGATIVE 12/19/2019 1800   LEUKOCYTESUR NEGATIVE 12/19/2019 1800   Sepsis Labs: @LABRCNTIP (procalcitonin:4,lacticidven:4)  ) Recent Results (from the past 240 hour(s))  Aerobic/Anaerobic Culture (surgical/deep wound)     Status: None   Collection Time: 12/21/19  6:03 PM   Specimen: Abscess  Result Value Ref Range Status   Specimen Description ABSCESS LEFT CALF  Final   Special Requests TISSUE  Final   Gram Stain   Final    RARE WBC PRESENT, PREDOMINANTLY MONONUCLEAR RARE GRAM POSITIVE COCCI IN PAIRS    Culture   Final    No growth aerobically or anaerobically. Performed at [facility], [address], [place], [place]     Report Status 12/26/2019 FINAL  Final      Studies: No results found.  Scheduled Meds: . aspirin EC  81 mg Oral Daily  . docusate sodium  100 mg Oral BID  . enoxaparin (LOVENOX) injection  50 mg Subcutaneous Q24H  . folic acid  1 mg Oral Daily  . gabapentin  100 mg Oral BID  . hydrALAZINE  50 mg Oral Q8H  . linezolid  600 mg Oral Q12H  . metoprolol tartrate  75 mg Oral BID  . multivitamin with minerals  1 tablet Oral Daily  . thiamine  100 mg Oral Daily    Continuous Infusions: . sodium chloride Stopped (12/25/19 1734)  . sodium chloride 75 mL/hr at 12/28/19 1727  . methocarbamol (ROBAXIN) IV       LOS: 14 days     12/30/19, MD [facility] Pager [phone]  If 7PM-7AM, please contact night-coverage [email or web address] Password [facility] 12/30/2019, 2:31 PM

## 2019-12-30 NOTE — Plan of Care (Signed)

## 2019-12-30 NOTE — Progress Notes (Signed)
Admit: 12/15/2019 LOS: 14  35M AKI from likely ACEi + Acute illness + hypoalbuminemia; bilateral cellulitis with soft tissue abscess; heavy alcohol use; chronic hyponatremia relatively stable  Subjective:  Marland Kitchen Reports that he feels better than yesterday. Eating more, denies fevers, chest pain, SOB, n/v.  07/22 0701 - 07/23 0700 In: 480 [P.O.:480] Out: 1550 [Urine:1550]  Filed Weights   12/26/19 1800 12/27/19 0500 12/28/19 0629  Weight: 119.8 kg 116.5 kg 116.7 kg    Scheduled Meds: . aspirin EC  81 mg Oral Daily  . docusate sodium  100 mg Oral BID  . enoxaparin (LOVENOX) injection  50 mg Subcutaneous Q24H  . folic acid  1 mg Oral Daily  . gabapentin  100 mg Oral BID  . hydrALAZINE  50 mg Oral Q8H  . linezolid  600 mg Oral Q12H  . metoprolol tartrate  75 mg Oral BID  . multivitamin with minerals  1 tablet Oral Daily  . thiamine  100 mg Oral Daily   Continuous Infusions: . sodium chloride Stopped (12/25/19 1734)  . sodium chloride 75 mL/hr at 12/28/19 1727  . methocarbamol (ROBAXIN) IV     PRN Meds:.acetaminophen **OR** acetaminophen, hydrALAZINE, HYDROmorphone (DILAUDID) injection, ipratropium-albuterol, liver oil-zinc oxide, methocarbamol **OR** methocarbamol (ROBAXIN) IV, metoCLOPramide **OR** metoCLOPramide (REGLAN) injection, ondansetron **OR** ondansetron (ZOFRAN) IV, oxyCODONE, oxyCODONE, polyethylene glycol, promethazine  Current Labs: reviewed    Physical Exam:  Blood pressure (!) 150/99, pulse 84, temperature 98.4 F (36.9 C), temperature source Oral, resp. rate 17, height 5' 7.01" (1.702 m), weight 116.7 kg, SpO2 96 %. GEN: , NAD ENT: NCAT EYES: EOMI CV: Regular, normal S1 and S2 PULM: CTA B ABD: Obese, soft EXT: Bilateral thighs edematous, dressings in place Neuro: no deficits    Recent Labs  Lab 12/28/19 0914 12/29/19 0254 12/30/19 0441  NA 133* 132* 134*  K 4.2 4.7 4.3  CL 102 101 102  CO2 23 20* 23  GLUCOSE 105* 127* 90  BUN 14 20 25*  CREATININE  1.95* 2.29* 2.18*  CALCIUM 8.4* 8.3* 8.3*   Recent Labs  Lab 12/28/19 0914 12/29/19 0254 12/30/19 0441  WBC 8.3 7.5 7.7  HGB 10.6* 10.3* 9.8*  HCT 32.5* 31.8* 30.7*  MCV 105.9* 105.6* 106.2*  PLT 259 247 214    A 1. Resolving/stable nonoliguric AKI, likely multifactorial from ACE inhibitor, acute infectious illness, hypoalbuminemia; nonoliguric. Renal ultrasound reassuring.  UA without features of GN.  Urine sodium was undetectable suggestive of impaired renal perfusion, improved with IVFs.  2. Bilateral lower extremity cellulitis on linezolid; soft tissue abscess status post I&D, and repeat I&D 7/21; per orthopedics and TRH 3. Heavy alcohol use, no drinking since last discharge 4. Hypertension, blood pressures fairly stable 5. Chronic hyponatremia (stable), likely due to low solute intake with alcohol use; fairly stable, continue to monitor 6. Hypoalbuminemia 7. Lower extremity edema secondary to hypoalbuminemia and IVF's, stable  P . Stable renal fntn, slightly up from 2-2.3 overall no significant change. In plateau phase, may very well be his new baseline for now? . Encourage protein and solute intake (has been drinking ensure) . Cont supportive care . Daily weights, Daily Renal Panel, Strict I/Os, Avoid nephrotoxins (NSAIDs, judicious IV Contrast)    Anthony Sar, MD [facility]

## 2019-12-30 NOTE — Plan of Care (Signed)

## 2019-12-31 LAB — BASIC METABOLIC PANEL
Anion gap: 9 (ref 5–15)
BUN: 23 mg/dL — ABNORMAL HIGH (ref 6–20)
CO2: 24 mmol/L (ref 22–32)
Calcium: 8.4 mg/dL — ABNORMAL LOW (ref 8.9–10.3)
Chloride: 101 mmol/L (ref 98–111)
Creatinine, Ser: 1.88 mg/dL — ABNORMAL HIGH (ref 0.61–1.24)
GFR calc Af Amer: 47 mL/min — ABNORMAL LOW (ref >60)
GFR calc non Af Amer: 40 mL/min — ABNORMAL LOW (ref >60)
Glucose, Bld: 109 mg/dL — ABNORMAL HIGH (ref 70–99)
Potassium: 4.3 mmol/L (ref 3.5–5.1)
Sodium: 134 mmol/L — ABNORMAL LOW (ref 135–145)

## 2019-12-31 LAB — CBC
HCT: 31.9 % — ABNORMAL LOW (ref 39.0–52.0)
Hemoglobin: 10.2 g/dL — ABNORMAL LOW (ref 13.0–17.0)
MCH: 33.9 pg (ref 26.0–34.0)
MCHC: 32 g/dL (ref 30.0–36.0)
MCV: 106 fL — ABNORMAL HIGH (ref 80.0–100.0)
Platelets: 196 10*3/uL (ref 150–400)
RBC: 3.01 MIL/uL — ABNORMAL LOW (ref 4.22–5.81)
RDW: 13.3 % (ref 11.5–15.5)
WBC: 7.8 10*3/uL (ref 4.0–10.5)
nRBC: 0 % (ref 0.0–0.2)

## 2019-12-31 MED ORDER — ASPIRIN 81 MG PO TBEC: 81.0000 mg | DELAYED_RELEASE_TABLET | Freq: Every day | ORAL | 0 refills | Status: DC

## 2019-12-31 MED ORDER — METOPROLOL TARTRATE 75 MG PO TABS: 75.0000 mg | ORAL_TABLET | Freq: Two times a day (BID) | ORAL | 0 refills | Status: DC

## 2019-12-31 MED ORDER — HYDRALAZINE HCL 50 MG PO TABS: 50.0000 mg | ORAL_TABLET | Freq: Three times a day (TID) | ORAL | 0 refills | Status: DC

## 2019-12-31 NOTE — Progress Notes (Signed)
Admit: 12/15/2019 LOS: 15  35M AKI from likely ACEi + Acute illness + hypoalbuminemia; bilateral cellulitis with soft tissue abscess; heavy alcohol use; chronic hyponatremia relatively stable  Subjective:  Robert Frank Reports that he feels better than yesterday. Eating more, denies fevers, chest pain, SOB, n/v.  07/23 0701 - 07/24 0700 In: 480 [P.O.:480] Out: 1750 [Urine:1750]  Filed Weights   12/26/19 1800 12/27/19 0500 12/28/19 0629  Weight: 119.8 kg 116.5 kg 116.7 kg    Scheduled Meds: . aspirin EC  81 mg Oral Daily  . docusate sodium  100 mg Oral BID  . enoxaparin (LOVENOX) injection  50 mg Subcutaneous Q24H  . folic acid  1 mg Oral Daily  . gabapentin  100 mg Oral BID  . hydrALAZINE  50 mg Oral Q8H  . linezolid  600 mg Oral Q12H  . metoprolol tartrate  75 mg Oral BID  . multivitamin with minerals  1 tablet Oral Daily  . thiamine  100 mg Oral Daily   Continuous Infusions: . sodium chloride Stopped (12/25/19 1734)  . sodium chloride 75 mL/hr at 12/28/19 1727  . methocarbamol (ROBAXIN) IV     PRN Meds:.acetaminophen **OR** acetaminophen, hydrALAZINE, HYDROmorphone (DILAUDID) injection, ipratropium-albuterol, liver oil-zinc oxide, methocarbamol **OR** methocarbamol (ROBAXIN) IV, metoCLOPramide **OR** metoCLOPramide (REGLAN) injection, ondansetron **OR** ondansetron (ZOFRAN) IV, oxyCODONE, oxyCODONE, polyethylene glycol, promethazine  Current Labs: reviewed    Physical Exam:  Blood pressure (!) 131/83, pulse 97, temperature 98.2 F (36.8 C), temperature source Oral, resp. rate 16, height 5' 7.01" (1.702 m), weight 116.7 kg, SpO2 97 %. GEN: , NAD, sitting up in chair ENT: NCAT EYES: EOMI CV: Regular, normal S1 and S2 PULM: CTA B ABD: Obese, soft EXT: Bilateral thighs edematous (improving), dressings in place Neuro: no deficits, speech clear and coherent    Recent Labs  Lab 12/29/19 0254 12/30/19 0441 12/31/19 0259  NA 132* 134* 134*  K 4.7 4.3 4.3  CL 101 102 101  CO2  20* 23 24  GLUCOSE 127* 90 109*  BUN 20 25* 23*  CREATININE 2.29* 2.18* 1.88*  CALCIUM 8.3* 8.3* 8.4*   Recent Labs  Lab 12/29/19 0254 12/30/19 0441 12/31/19 0259  WBC 7.5 7.7 7.8  HGB 10.3* 9.8* 10.2*  HCT 31.8* 30.7* 31.9*  MCV 105.6* 106.2* 106.0*  PLT 247 214 196    A 1. Resolving/stable nonoliguric AKI, likely multifactorial from ACE inhibitor, acute infectious illness, hypoalbuminemia; nonoliguric. Renal ultrasound reassuring.  UA without features of GN.  Urine sodium was undetectable suggestive of impaired renal perfusion, improved with IVFs.  2. Bilateral lower extremity cellulitis on linezolid; soft tissue abscess status post I&D, and repeat I&D 7/21; per orthopedics and TRH 3. Heavy alcohol use, no drinking since last discharge 4. Hypertension, blood pressures fairly stable 5. Chronic hyponatremia (stable), likely due to low solute intake with alcohol use; fairly stable, continue to monitor 6. Hypoalbuminemia 7. Lower extremity edema secondary to hypoalbuminemia and IVF's, stable 8. Macrocytic anemia  P . Stable renal fntn, this may very well be his new baseline kidney function. . Will set him up for follow up with our office . Encourage protein and solute intake (has been drinking ensure) . Check b12 levels, replete if needed, hgb stable . Cont supportive care . Daily weights, Daily Renal Panel, Strict I/Os, Avoid nephrotoxins (NSAIDs, judicious IV Contrast)    We will sign off from nephrology perspective. Thank you for involving [place] in the care of this patient. Please call with any questions/concerns.  Anthony Sar,  MD [facility]

## 2019-12-31 NOTE — Progress Notes (Signed)
PROGRESS NOTE  Robert Frank FIE:[id number] DOB: 1966-04-03 DOA: 12/15/2019 PCP: Patient, No Pcp Per  HPI/Recap of past 24 hours: 54 y.o.WM PMHxCVA and EtOH abuse who presented to [facility] ED for evaluation of right knee injury after a fall.   In the ER, found to have bilateral lower extremity erythema and swelling. When compared to the previous week when he was admitted, pictures in the chart, his erythema and swelling worsened. Hyponatremia with a serum sodium of 122. TRH, Hospitalist team was asked to admit for further evaluation and management.  Presented with left calf abscess and bilateral lower extremity cellulitis.  Had I&D done on 12/21/2019 by Dr. August Saucer.  I&D repeated on 12/28/2019 by Dr. Lajoyce Corners.  Was on IV linezolid empirically up to 7/23 then switched to oral Linezolid.Marland Kitchen  MRSA negative on 12/06/2019.  Hospital course complicated by AKI suspected multifactorial in the setting of infection, ACE inhibitor, and IV vancomycin.  He received IV fluid hydration which was stopped due to concern for volume overload.  Fell morning of 12/25/19, noticed small bump on the back of his head, no other injuries noted.  Fall precautions in place.  He denies any pain.  States he falls a lot.  12/31/19: Seen and examined.  POD #3 post repeat I&D LLE. No new complaints.  TOC assisting with safe disposition.   Assessment/Plan: Principal Problem:   Bilateral cellulitis of lower leg Active Problems:   COPD (chronic obstructive pulmonary disease) (HCC)   Alcoholism (HCC)   Generalized weakness   Hyponatremia   Bilateral lower leg cellulitis   Essential hypertension   Anemia of chronic disease   Abscess of left lower leg  Bilateral lower extremity cellulitis with abscess in the left calf area post I&D x 2 Patient was initially started on vancomycin and Zosyn. However due to worsening renal function was changed over to linezolid and zosyn was stopped. MRI was obtained which showedevidence for  cellulitis but also shows an abscess in the left lower leg. Patient was taken to the OR 7/14 for I&D by Dr. August Saucer. Repeat I&D by Dr. Lajoyce Corners on 12/28/2019  IV linezolid switched to oral on 12/30/2019  Hypovolemic hyponatremia, mild Asymptomatic Serum sodium 134 on IV fluid normal saline at 75 cc/h Continue to monitor  Nonoliguric acute kidney injury Baseline creatinine is 0.6 with GFR greater than 60. Acute renal failure likely multifactorial, in the setting of infection, ACE inhibitor, IV vancomycin. Downtrending creatinine 1.8 from 2.18 from 2.29\ 1.7 L urine output recorded in the last 24 hours. Continue to avoid nephrotoxins and hypotension. Echocardiogram showed normal systolic function. Appreciate nephrology's assistance  History of COPD Stable O2 saturation 94 to 98% on room  Resolved right upper extremity discomfort, unclear etiology No significant edema on exam  Essential hypertension Blood pressure stable Continue metoprolol 75 mg twice daily, hydralazine 50 mg 3 times daily Continue to hold off lisinopril due to AKI. Continue to monitor vital signs.  Alcohol abuse No evidence of alcohol withdrawal at the time of this visit Continue on CIWA protocol Continue multivitamins, folic acid, thiamine  Anemia of chronic disease Hemoglobin 9.8 Baseline hemoglobin 10.6  Ambulatory dysfunction with fall Fell on 12/25/2019 PT assessment recommended home health PT OT TOC assisting with home health services and disposition. Continue PT OT with assistance and fall precautions.  Morbid obesity BMI 40 Recommend weight loss outpatient with regular physical activity 30 to 60 minutes a day 5 times a week and healthy dieting  Poor social support Per report  patient has had no electricity since 2014  Still no electricity in his home TOC working on safe disposition   DVT prophylaxis:Lovenox subcu daily Code Status:Full code Family Communication: None at  bedside  Status is: Inpatient   Dispo:             Patient From: Home             Planned Disposition: SNF             Expected discharge date: 01/01/20             Medically stable for discharge: yes, pending safe disposition.  Objective: Vitals:   12/30/19 1916 12/31/19 0300 12/31/19 0618 12/31/19 0819  BP: (!) 138/87 (!) 131/88 (!) 138/72 (!) 131/83  Pulse: 77 78  97  Resp: 17 16    Temp: 98.3 F (36.8 C) 98.1 F (36.7 C)  98.2 F (36.8 C)  TempSrc: Oral Axillary  Oral  SpO2: 97% 97%  97%  Weight:      Height:        Intake/Output Summary (Last 24 hours) at 12/31/2019 1209 Last data filed at 12/31/2019 0957 Gross per 24 hour  Intake 240 ml  Output 1150 ml  Net -910 ml   Filed Weights   12/26/19 1800 12/27/19 0500 12/28/19 0629  Weight: 119.8 kg 116.5 kg 116.7 kg    Exam: . General: 54 y.o. year-old male obese in no acute distress.  Alert and oriented x3. . Cardiovascular: Regular rate and rhythm no rubs or gallops noted . Respiratory: Clear to Auscultation No Wheezes or Rales.   . Abdomen: Obese nontender normal bowel sounds present.   . Musculoskeletal: Trace lower extremity edema bilaterally.  Left lower extremity in surgical dressing.   Marland Kitchen Psychiatry: Mood is appropriate for condition and setting.   Data Reviewed: CBC: Recent Labs  Lab 12/27/19 0455 12/28/19 0914 12/29/19 0254 12/30/19 0441 12/31/19 0259  WBC 8.8 8.3 7.5 7.7 7.8  HGB 10.6* 10.6* 10.3* 9.8* 10.2*  HCT 33.2* 32.5* 31.8* 30.7* 31.9*  MCV 105.4* 105.9* 105.6* 106.2* 106.0*  PLT 281 259 247 214 196   Basic Metabolic Panel: Recent Labs  Lab 12/27/19 0455 12/28/19 0914 12/29/19 0254 12/30/19 0441 12/31/19 0259  NA 134* 133* 132* 134* 134*  K 4.7 4.2 4.7 4.3 4.3  CL 104 102 101 102 101  CO2 21* 23 20* 23 24  GLUCOSE 107* 105* 127* 90 109*  BUN 19 14 20  25* 23*  CREATININE 2.12* 1.95* 2.29* 2.18* 1.88*  CALCIUM 8.5* 8.4* 8.3* 8.3* 8.4*   GFR: Estimated Creatinine Clearance:  56.1 mL/min (A) (by C-G formula based on SCr of 1.88 mg/dL (H)). Liver Function Tests: No results for input(s): AST, ALT, ALKPHOS, BILITOT, PROT, ALBUMIN in the last 168 hours. No results for input(s): LIPASE, AMYLASE in the last 168 hours. No results for input(s): AMMONIA in the last 168 hours. Coagulation Profile: No results for input(s): INR, PROTIME in the last 168 hours. Cardiac Enzymes: No results for input(s): CKTOTAL, CKMB, CKMBINDEX, TROPONINI in the last 168 hours. BNP (last 3 results) No results for input(s): PROBNP in the last 8760 hours. HbA1C: No results for input(s): HGBA1C in the last 72 hours. CBG: No results for input(s): GLUCAP in the last 168 hours. Lipid Profile: No results for input(s): CHOL, HDL, LDLCALC, TRIG, CHOLHDL, LDLDIRECT in the last 72 hours. Thyroid Function Tests: No results for input(s): TSH, T4TOTAL, FREET4, T3FREE, THYROIDAB in the last 72 hours. Anemia Panel: No  results for input(s): VITAMINB12, FOLATE, FERRITIN, TIBC, IRON, RETICCTPCT in the last 72 hours. Urine analysis:    Component Value Date/Time   COLORURINE YELLOW 12/19/2019 1800   APPEARANCEUR HAZY (A) 12/19/2019 1800   LABSPEC 1.008 12/19/2019 1800   PHURINE 5.0 12/19/2019 1800   GLUCOSEU NEGATIVE 12/19/2019 1800   HGBUR MODERATE (A) 12/19/2019 1800   BILIRUBINUR NEGATIVE 12/19/2019 1800   KETONESUR NEGATIVE 12/19/2019 1800   PROTEINUR 30 (A) 12/19/2019 1800   NITRITE NEGATIVE 12/19/2019 1800   LEUKOCYTESUR NEGATIVE 12/19/2019 1800   Sepsis Labs: @LABRCNTIP (procalcitonin:4,lacticidven:4)  ) Recent Results (from the past 240 hour(s))  Aerobic/Anaerobic Culture (surgical/deep wound)     Status: None   Collection Time: 12/21/19  6:03 PM   Specimen: Abscess  Result Value Ref Range Status   Specimen Description ABSCESS LEFT CALF  Final   Special Requests TISSUE  Final   Gram Stain   Final    RARE WBC PRESENT, PREDOMINANTLY MONONUCLEAR RARE GRAM POSITIVE COCCI IN PAIRS     Culture   Final    No growth aerobically or anaerobically. Performed at [facility], [address], [place], [place]    Report Status 12/26/2019 FINAL  Final      Studies: No results found.  Scheduled Meds: . aspirin EC  81 mg Oral Daily  . docusate sodium  100 mg Oral BID  . enoxaparin (LOVENOX) injection  50 mg Subcutaneous Q24H  . folic acid  1 mg Oral Daily  . gabapentin  100 mg Oral BID  . hydrALAZINE  50 mg Oral Q8H  . linezolid  600 mg Oral Q12H  . metoprolol tartrate  75 mg Oral BID  . multivitamin with minerals  1 tablet Oral Daily  . thiamine  100 mg Oral Daily    Continuous Infusions: . sodium chloride Stopped (12/25/19 1734)  . sodium chloride 75 mL/hr at 12/28/19 1727  . methocarbamol (ROBAXIN) IV       LOS: 15 days     12/30/19, MD [facility] Pager [phone]  If 7PM-7AM, please contact night-coverage [email or web address] Password [facility] 12/31/2019, 12:09 PM

## 2019-12-31 NOTE — Discharge Instructions (Signed)
Cellulitis, Adult  Cellulitis is a skin infection. The infected area is often warm, red, swollen, and sore. It occurs most often in the arms and lower legs. It is very important to get treated for this condition. What are the causes? This condition is caused by bacteria. The bacteria enter through a break in the skin, such as a cut, burn, insect bite, open sore, or crack. What increases the risk? This condition is more likely to occur in people who:  Have a weak body defense system (immune system).  Have open cuts, burns, bites, or scrapes on the skin.  Are older than 52 years of age.  Have a blood sugar problem (diabetes).  Have a long-lasting (chronic) liver disease (cirrhosis) or kidney disease.  Are very overweight (obese).  Have a skin problem, such as: ? Itchy rash (eczema). ? Slow movement of blood in the veins (venous stasis). ? Fluid buildup below the skin (edema).  Have been treated with high-energy rays (radiation).  Use IV drugs. What are the signs or symptoms? Symptoms of this condition include:  Skin that is: ? Red. ? Streaking. ? Spotting. ? Swollen. ? Sore or painful when you touch it. ? Warm.  A fever.  Chills.  Blisters. How is this diagnosed? This condition is diagnosed based on:  Medical history.  Physical exam.  Blood tests.  Imaging tests. How is this treated? Treatment for this condition may include:  Medicines to treat infections or allergies.  Home care, such as: ? Rest. ? Placing cold or warm cloths (compresses) on the skin.  Hospital care, if the condition is very bad. Follow these instructions at home: Medicines  Take over-the-counter and prescription medicines only as told by your doctor.  If you were prescribed an antibiotic medicine, take it as told by your doctor. Do not stop taking it even if you start to feel better. General instructions   Drink enough fluid to keep your pee (urine) pale yellow.  Do not touch  or rub the infected area.  Raise (elevate) the infected area above the level of your heart while you are sitting or lying down.  Place cold or warm cloths on the area as told by your doctor.  Keep all follow-up visits as told by your doctor. This is important. Contact a doctor if:  You have a fever.  You do not start to get better after 1-2 days of treatment.  Your bone or joint under the infected area starts to hurt after the skin has healed.  Your infection comes back. This can happen in the same area or another area.  You have a swollen bump in the area.  You have new symptoms.  You feel ill and have muscle aches and pains. Get help right away if:  Your symptoms get worse.  You feel very sleepy.  You throw up (vomit) or have watery poop (diarrhea) for a long time.  You see red streaks coming from the area.  Your red area gets larger.  Your red area turns dark in color. These symptoms may represent a serious problem that is an emergency. Do not wait to see if the symptoms will go away. Get medical help right away. Call your local emergency services (911 in the [place] Do not drive yourself to the hospital. Summary  Cellulitis is a skin infection. The area is often warm, red, swollen, and sore.  This condition is treated with medicines, rest, and cold and warm cloths.  Take all medicines only   as told by your doctor.  Tell your doctor if symptoms do not start to get better after 1-2 days of treatment. This information is not intended to replace advice given to you by your health care provider. Make sure you discuss any questions you have with your health care provider. Document Revised: 10/15/2017 Document Reviewed: 10/15/2017 Elsevier Patient Education  2020 [employer] Inc[employer]   Acute Kidney Injury, Adult  Acute kidney injury is a sudden worsening of kidney function. The kidneys are organs that have several jobs. They filter the blood to remove waste products and extra  fluid. They also maintain a healthy balance of minerals and hormones in the body, which helps control blood pressure and keep bones strong. With this condition, your kidneys do not do their jobs as well as they should. This condition ranges from mild to severe. Over time it may develop into long-lasting (chronic) kidney disease. Early detection and treatment may prevent acute kidney injury from developing into a chronic condition. What are the causes? Common causes of this condition include:  A problem with blood flow to the kidneys. This may be caused by: ? Low blood pressure (hypotension) or shock. ? Blood loss. ? Heart and blood vessel (cardiovascular) disease. ? Severe burns. ? Liver disease.  Direct damage to the kidneys. This may be caused by: ? Certain medicines. ? A kidney infection. ? Poisoning. ? Being around or in contact with toxic substances. ? A surgical wound. ? A hard, direct hit to the kidney area.  A sudden blockage of urine flow. This may be caused by: ? Cancer. ? Kidney stones. ? An enlarged prostate in males. What are the signs or symptoms? Symptoms of this condition may not be obvious until the condition becomes severe. Symptoms of this condition can include:  Tiredness (lethargy), or difficulty staying awake.  Nausea or vomiting.  Swelling (edema) of the face, legs, ankles, or feet.  Problems with urination, such as: ? Abdominal pain, or pain along the side of your stomach (flank). ? Decreased urine production. ? Decrease in the force of urine flow.  Muscle twitches and cramps, especially in the legs.  Confusion or trouble concentrating.  Loss of appetite.  Fever. How is this diagnosed? This condition may be diagnosed with tests, including:  Blood tests.  Urine tests.  Imaging tests.  A test in which a sample of tissue is removed from the kidneys to be examined under a microscope (kidney biopsy). How is this treated? Treatment for this  condition depends on the cause and how severe the condition is. In mild cases, treatment may not be needed. The kidneys may heal on their own. In more severe cases, treatment will involve:  Treating the cause of the kidney injury. This may involve changing any medicines you are taking or adjusting your dosage.  Fluids. You may need specialized IV fluids to balance your body's needs.  Having a catheter placed to drain urine and prevent blockages.  Preventing problems from occurring. This may mean avoiding certain medicines or procedures that can cause further injury to the kidneys. In some cases treatment may also require:  A procedure to remove toxic wastes from the body (dialysis or continuous renal replacement therapy - CRRT).  Surgery. This may be done to repair a torn kidney, or to remove the blockage from the urinary system. Follow these instructions at home: Medicines  Take over-the-counter and prescription medicines only as told by your health care provider.  Do not take any new medicines  without your health care provider's approval. Many medicines can worsen your kidney damage.  Do not take any vitamin and mineral supplements without your health care provider's approval. Many nutritional supplements can worsen your kidney damage. Lifestyle  If your health care provider prescribed changes to your diet, follow them. You may need to decrease the amount of protein you eat.  Achieve and maintain a healthy weight. If you need help with this, ask your health care provider.  Start or continue an exercise plan. Try to exercise at least 30 minutes a day, 5 days a week.  Do not use any tobacco products, such as cigarettes, chewing tobacco, and e-cigarettes. If you need help quitting, ask your health care provider. General instructions  Keep track of your blood pressure. Report changes in your blood pressure as told by your health care provider.  Stay up to date with immunizations. Ask  your health care provider which immunizations you need.  Keep all follow-up visits as told by your health care provider. This is important. Where to find more information  American Association of Kidney Patients: ResidentialShow.is  [employer]: www.kidney.org  [employer]: FightingMatch.com.ee  [employer] [facility]: ? www.lifeoptions.org ? www.kidneyschool.org Contact a health care provider if:  Your symptoms get worse.  You develop new symptoms. Get help right away if:  You develop symptoms of worsening kidney disease, which include: ? Headaches. ? Abnormally dark or light skin. ? Easy bruising. ? Frequent hiccups. ? Chest pain. ? Shortness of breath. ? End of menstruation in women. ? Seizures. ? Confusion or altered mental status. ? Abdominal or back pain. ? Itchiness.  You have a fever.  Your body is producing less urine.  You have pain or bleeding when you urinate. Summary  Acute kidney injury is a sudden worsening of kidney function.  Acute kidney injury can be caused by problems with blood flow to the kidneys, direct damage to the kidneys, and sudden blockage of urine flow.  Symptoms of this condition may not be obvious until it becomes severe. Symptoms may include edema, lethargy, confusion, nausea or vomiting, and problems passing urine.  This condition can usually be diagnosed with blood tests, urine tests, and imaging tests. Sometimes a kidney biopsy is done to diagnose this condition.  Treatment for this condition often involves treating the underlying cause. It is treated with fluids, medicines, dialysis, diet changes, or surgery. This information is not intended to replace advice given to you by your health care provider. Make sure you discuss any questions you have with your health care provider. Document Revised: 05/08/2017 Document Reviewed: 05/16/2016 Elsevier Patient Education  2020 [employer].

## 2020-01-01 NOTE — Progress Notes (Signed)
PROGRESS NOTE  TEION BALLIN CBJ:[id number] DOB: 1965/08/23 DOA: 12/15/2019 PCP: Patient, No Pcp Per  HPI/Recap of past 24 hours: 54 y.o.WM PMHxCVA and EtOH abuse who presented to [facility] ED for evaluation of right knee injury after a fall.   In the ER, found to have bilateral lower extremity erythema and swelling. When compared to the previous week when he was admitted, pictures in the chart, his erythema and swelling worsened. Hyponatremia with a serum sodium of 122. TRH, Hospitalist team was asked to admit for further evaluation and management.  Presented with left calf abscess and bilateral lower extremity cellulitis.  Had I&D done on 12/21/2019 by Dr. August Saucer.  I&D repeated on 12/28/2019 by Dr. Lajoyce Corners.  Was on IV linezolid empirically up to 7/23 then switched to oral Linezolid.Marland Kitchen  MRSA negative on 12/06/2019.  Hospital course complicated by AKI suspected multifactorial in the setting of infection, ACE inhibitor, and IV vancomycin.  He received IV fluid hydration which was stopped due to concern for volume overload.  Fell morning of 12/25/19, noticed small bump on the back of his head, no other injuries noted.  Fall precautions in place.  He denies any pain.  States he falls a lot.  52/25/21: Seen and examined.  POD #4 post repeat I&D LLE. No new complaints.  TOC assisting with safe disposition.   Assessment/Plan: Principal Problem:   Bilateral cellulitis of lower leg Active Problems:   COPD (chronic obstructive pulmonary disease) (HCC)   Alcoholism (HCC)   Generalized weakness   Hyponatremia   Bilateral lower leg cellulitis   Essential hypertension   Anemia of chronic disease   Abscess of left lower leg  Bilateral lower extremity cellulitis with abscess in the left calf area post I&D x 2 Patient was initially started on vancomycin and Zosyn. However due to worsening renal function was changed over to linezolid and zosyn was stopped. MRI was obtained which showedevidence for  cellulitis but also shows an abscess in the left lower leg. Patient was taken to the OR 7/14 for I&D by Dr. August Saucer. Repeat I&D by Dr. Lajoyce Corners on 12/28/2019  IV linezolid switched to oral on 12/30/2019  Hypovolemic hyponatremia, mild Asymptomatic Serum sodium 134 on IV fluid normal saline at 75 cc/h Continue to monitor  Nonoliguric acute kidney injury Baseline creatinine is 0.6 with GFR greater than 60. Acute renal failure likely multifactorial, in the setting of infection, ACE inhibitor, IV vancomycin. Downtrending creatinine 1.8 from 2.18 from 2.29\ 1.7 L urine output recorded in the last 24 hours. Continue to avoid nephrotoxins and hypotension. Echocardiogram showed normal systolic function. Appreciate nephrology's assistance  History of COPD Stable O2 saturation 94 to 98% on room  Resolved right upper extremity discomfort, unclear etiology No significant edema on exam  Essential hypertension Blood pressure stable Continue metoprolol 75 mg twice daily, hydralazine 50 mg 3 times daily Continue to hold off lisinopril due to AKI. Continue to monitor vital signs.  Alcohol abuse No evidence of alcohol withdrawal at the time of this visit Continue on CIWA protocol Continue multivitamins, folic acid, thiamine  Anemia of chronic disease Hemoglobin 9.8 Baseline hemoglobin 10.6  Ambulatory dysfunction with fall Fell on 12/25/2019 PT assessment recommended home health PT OT TOC assisting with home health services and disposition. Continue PT OT with assistance and fall precautions.  Morbid obesity BMI 40 Recommend weight loss outpatient with regular physical activity 30 to 60 minutes a day 5 times a week and healthy dieting  Poor social support Per report  patient has had no electricity since 2014  Still no electricity in his home TOC working on safe disposition   DVT prophylaxis:Lovenox subcu daily Code Status:Full code Family Communication: None at  bedside  Status is: Inpatient   Dispo:             Patient From: Home             Planned Disposition: SNF             Expected discharge date: 01/02/20             Medically stable for discharge: yes, pending safe disposition.  Objective: Vitals:   12/31/19 0819 12/31/19 1438 01/01/20 0412 01/01/20 0740  BP: (!) 131/83 (!) 141/102 (!) 139/92 (!) 156/101  Pulse: 97 76 87 83  Resp:  19 17 16   Temp: 98.2 F (36.8 C) (!) 97.5 F (36.4 C)  (!) 97.4 F (36.3 C)  TempSrc: Oral Oral  Oral  SpO2: 97% 98% 97% 99%  Weight:      Height:        Intake/Output Summary (Last 24 hours) at 01/01/2020 1252 Last data filed at 01/01/2020 1100 Gross per 24 hour  Intake 240 ml  Output 1620 ml  Net -1380 ml   Filed Weights   12/26/19 1800 12/27/19 0500 12/28/19 0629  Weight: 119.8 kg 116.5 kg 116.7 kg    Exam: No significant changes from previous exam. . General: 54 y.o. year-old male obese in no acute distress.  Alert and oriented x3. . Cardiovascular: Regular rate and rhythm no rubs or gallops noted . Respiratory: Clear to Auscultation No Wheezes or Rales.   . Abdomen: Obese nontender normal bowel sounds present.   . Musculoskeletal: Trace lower extremity edema bilaterally.  Left lower extremity in surgical dressing.   44 Psychiatry: Mood is appropriate for condition and setting.   Data Reviewed: CBC: Recent Labs  Lab 12/27/19 0455 12/28/19 0914 12/29/19 0254 12/30/19 0441 12/31/19 0259  WBC 8.8 8.3 7.5 7.7 7.8  HGB 10.6* 10.6* 10.3* 9.8* 10.2*  HCT 33.2* 32.5* 31.8* 30.7* 31.9*  MCV 105.4* 105.9* 105.6* 106.2* 106.0*  PLT 281 259 247 214 196   Basic Metabolic Panel: Recent Labs  Lab 12/27/19 0455 12/28/19 0914 12/29/19 0254 12/30/19 0441 12/31/19 0259  NA 134* 133* 132* 134* 134*  K 4.7 4.2 4.7 4.3 4.3  CL 104 102 101 102 101  CO2 21* 23 20* 23 24  GLUCOSE 107* 105* 127* 90 109*  BUN 19 14 20  25* 23*  CREATININE 2.12* 1.95* 2.29* 2.18* 1.88*  CALCIUM 8.5* 8.4*  8.3* 8.3* 8.4*   GFR: Estimated Creatinine Clearance: 56.1 mL/min (A) (by C-G formula based on SCr of 1.88 mg/dL (H)). Liver Function Tests: No results for input(s): AST, ALT, ALKPHOS, BILITOT, PROT, ALBUMIN in the last 168 hours. No results for input(s): LIPASE, AMYLASE in the last 168 hours. No results for input(s): AMMONIA in the last 168 hours. Coagulation Profile: No results for input(s): INR, PROTIME in the last 168 hours. Cardiac Enzymes: No results for input(s): CKTOTAL, CKMB, CKMBINDEX, TROPONINI in the last 168 hours. BNP (last 3 results) No results for input(s): PROBNP in the last 8760 hours. HbA1C: No results for input(s): HGBA1C in the last 72 hours. CBG: No results for input(s): GLUCAP in the last 168 hours. Lipid Profile: No results for input(s): CHOL, HDL, LDLCALC, TRIG, CHOLHDL, LDLDIRECT in the last 72 hours. Thyroid Function Tests: No results for input(s): TSH, T4TOTAL, FREET4, T3FREE, THYROIDAB  in the last 72 hours. Anemia Panel: No results for input(s): VITAMINB12, FOLATE, FERRITIN, TIBC, IRON, RETICCTPCT in the last 72 hours. Urine analysis:    Component Value Date/Time   COLORURINE YELLOW 12/19/2019 1800   APPEARANCEUR HAZY (A) 12/19/2019 1800   LABSPEC 1.008 12/19/2019 1800   PHURINE 5.0 12/19/2019 1800   GLUCOSEU NEGATIVE 12/19/2019 1800   HGBUR MODERATE (A) 12/19/2019 1800   BILIRUBINUR NEGATIVE 12/19/2019 1800   KETONESUR NEGATIVE 12/19/2019 1800   PROTEINUR 30 (A) 12/19/2019 1800   NITRITE NEGATIVE 12/19/2019 1800   LEUKOCYTESUR NEGATIVE 12/19/2019 1800   Sepsis Labs: @LABRCNTIP (procalcitonin:4,lacticidven:4)  ) No results found for this or any previous visit (from the past 240 hour(s)).    Studies: No results found.  Scheduled Meds: . aspirin EC  81 mg Oral Daily  . docusate sodium  100 mg Oral BID  . enoxaparin (LOVENOX) injection  50 mg Subcutaneous Q24H  . folic acid  1 mg Oral Daily  . gabapentin  100 mg Oral BID  . hydrALAZINE   50 mg Oral Q8H  . linezolid  600 mg Oral Q12H  . metoprolol tartrate  75 mg Oral BID  . multivitamin with minerals  1 tablet Oral Daily  . thiamine  100 mg Oral Daily    Continuous Infusions: . sodium chloride Stopped (12/25/19 1734)  . sodium chloride 75 mL/hr at 12/28/19 1727  . methocarbamol (ROBAXIN) IV       LOS: 16 days     12/30/19, MD [facility] Pager [phone]  If 7PM-7AM, please contact night-coverage [email or web address] Password [facility] 01/01/2020, 12:52 PM

## 2020-01-02 LAB — BASIC METABOLIC PANEL
Anion gap: 8 (ref 5–15)
BUN: 22 mg/dL — ABNORMAL HIGH (ref 6–20)
CO2: 25 mmol/L (ref 22–32)
Calcium: 8.3 mg/dL — ABNORMAL LOW (ref 8.9–10.3)
Chloride: 103 mmol/L (ref 98–111)
Creatinine, Ser: 1.42 mg/dL — ABNORMAL HIGH (ref 0.61–1.24)
GFR calc Af Amer: >60 mL/min (ref >60)
GFR calc non Af Amer: 56 mL/min — ABNORMAL LOW (ref >60)
Glucose, Bld: 116 mg/dL — ABNORMAL HIGH (ref 70–99)
Potassium: 4.2 mmol/L (ref 3.5–5.1)
Sodium: 136 mmol/L (ref 135–145)

## 2020-01-02 NOTE — Plan of Care (Signed)

## 2020-01-02 NOTE — Progress Notes (Signed)
Patient accidentally spilled urinal with urine on sheets and floor.  Staff went in to clean patient up and to clean up bed and floor.  Patient did not want staff to clean him but allowed [place] to change linens and his gown.  He did not want to clean his body/skin.

## 2020-01-02 NOTE — Progress Notes (Addendum)
Physical Therapy Treatment Patient Details Name: Robert Frank MRN: [id number] DOB: 1965-07-19 Today's Date: 01/02/2020    History of Present Illness 54 y.o. male with medical history significant of CVA and alcoholism presents to the emergency department for evaluation of right knee injury after a fall and BLE cellulitis.  Patient reports that he is having trouble walking, feels weak all over.    He has had several falls at home over the last few days, and is now s/p I&D of LLE calf abscess. He was admitted for similar complaints last week.  s/p I&D 12/28/19.    PT Comments    Patient progressing slowly towards PT goals. Reports not getting up much over the weekend. Continues to demonstrate BLE weakness needing elevated surface or something to pull up on to stand. Tolerated ambulation with use of RW and Min guard assist for support. Pt continues to be a fall risk. Encouraged increasing activity while in the hospital to improve overall strength/mobility.  Will plan for stair training next session as tolerated to prepare for d/c home. Will follow.    Follow Up Recommendations  Home health PT;Supervision/Assistance - 24 hour     Equipment Recommendations  Wheelchair (measurements PT);Wheelchair cushion (measurements PT)    Recommendations for Other Services       Precautions / Restrictions Precautions Precautions: Fall Precaution Comments: multiple wounds on buttocks and LEs; unna boots are pushed down and balled up around left ankle Restrictions Weight Bearing Restrictions: Yes RLE Weight Bearing: Weight bearing as tolerated LLE Weight Bearing: Weight bearing as tolerated    Mobility  Bed Mobility Overal bed mobility: Needs Assistance Bed Mobility: Supine to Sit   Sidelying to sit: Supervision;HOB elevated       General bed mobility comments: Use of rail, no assist needed.  Transfers Overall transfer level: Needs assistance Equipment used: Rolling walker (2  wheeled) Transfers: Sit to/from Stand Sit to Stand: Min guard;From elevated surface         General transfer comment: uses momentum from elevated surface, no physical assist. Stood from EOB x1, from toilet x1. Transferred to chair post ambulation.  Ambulation/Gait Ambulation/Gait assistance: Min guard Gait Distance (Feet): 150 Feet Assistive device: Rolling walker (2 wheeled) Gait Pattern/deviations: Step-through pattern;Decreased stride length;Decreased dorsiflexion - right;Decreased dorsiflexion - left;Trunk flexed Gait velocity: decreased Gait velocity interpretation: <1.8 ft/sec, indicate of risk for recurrent falls General Gait Details: Slow, mildly unsteady gait with bil knee instability notable with increased distance but no buckling.   Stairs             Wheelchair Mobility    Modified Rankin (Stroke Patients Only)       Balance Overall balance assessment: Needs assistance Sitting-balance support: Feet supported;No upper extremity supported Sitting balance-Leahy Scale: Good     Standing balance support: During functional activity Standing balance-Leahy Scale: Fair Standing balance comment: reliant on at least 1 UE support of RW; able to perform pericare wtihout assist and turn around to urinate.                            Cognition Arousal/Alertness: Awake/alert Behavior During Therapy: Flat affect Overall Cognitive Status: No family/caregiver present to determine baseline cognitive functioning Area of Impairment: Awareness;Problem solving;Safety/judgement;Following commands                       Following Commands: Follows one step commands with increased time Safety/Judgement: Decreased awareness of safety Awareness:  Emergent Problem Solving: Slow processing;Requires verbal cues General Comments: pt with generally flat affect; minimal verbal interactions, appears in sad mood, wants to go home.      Exercises      General  Comments        Pertinent Vitals/Pain Pain Assessment: Faces Faces Pain Scale: Hurts little more Pain Location: chronic back pain, LEs Pain Descriptors / Indicators: Grimacing;Guarding;Discomfort Pain Intervention(s): Monitored during session;Repositioned    Home Living                      Prior Function            PT Goals (current goals can now be found in the care plan section) Progress towards PT goals: Progressing toward goals    Frequency    Min 3X/week      PT Plan Current plan remains appropriate    Co-evaluation              AM-PAC PT "6 Clicks" Mobility   Outcome Measure  Help needed turning from your back to your side while in a flat bed without using bedrails?: None Help needed moving from lying on your back to sitting on the side of a flat bed without using bedrails?: None Help needed moving to and from a bed to a chair (including a wheelchair)?: A Little Help needed standing up from a chair using your arms (e.g., wheelchair or bedside chair)?: A Little Help needed to walk in hospital room?: A Little Help needed climbing 3-5 steps with a railing? : A Lot 6 Click Score: 19    End of Session Equipment Utilized During Treatment: Gait belt Activity Tolerance: Patient tolerated treatment well Patient left: in chair;with call bell/phone within reach;with chair alarm set Nurse Communication: Mobility status PT Visit Diagnosis: Unsteadiness on feet (R26.81);Other abnormalities of gait and mobility (R26.89);History of falling (Z91.81);Pain Pain - Right/Left:  (bil) Pain - part of body: Leg (back)     Time: 4098-1191 PT Time Calculation (min) (ACUTE ONLY): 36 min  Charges:  $Gait Training: 8-22 mins $Therapeutic Activity: 8-22 mins                     Robert Frank, PT, DPT Acute Rehabilitation Services Pager [phone] Office [phone]       Robert Frank 01/02/2020, 2:51 PM

## 2020-01-02 NOTE — Progress Notes (Signed)
Pt performing toileting and standing grooming with min guard assist. Less pain in L LE and improved ability to reach toward foot. Pt with minimal OOB activity over the weekend. Remains generally weak and unsteady.    01/02/20 1500  OT Visit Information  Last OT Received On 01/02/20  Assistance Needed +1  History of Present Illness 54 y.o. male with medical history significant of CVA and alcoholism presents to the emergency department for evaluation of right knee injury after a fall and BLE cellulitis.  Patient reports that he is having trouble walking, feels weak all over.    He has had several falls at home over the last few days, and is now s/p I&D of LLE calf abscess. He was admitted for similar complaints last week.  s/p I&D 12/28/19.  Precautions  Precautions Fall  Precaution Comments multiple wounds on buttocks and LEs; unna boots are pushed down and balled up around left ankle  Pain Assessment  Faces Pain Scale 4  Pain Location back, LEs  Pain Descriptors / Indicators Grimacing;Guarding;Discomfort  Pain Intervention(s) Monitored during session;Repositioned  Cognition  Arousal/Alertness Awake/alert  Behavior During Therapy Flat affect  Overall Cognitive Status No family/caregiver present to determine baseline cognitive functioning  Area of Impairment Awareness;Problem solving;Safety/judgement;Following commands  Current Attention Level Sustained  Memory Decreased recall of precautions  Following Commands Follows one step commands with increased time  Safety/Judgement Decreased awareness of safety  Awareness Emergent  Problem Solving Slow processing;Requires verbal cues  General Comments pt minimally verbal  ADL  Overall ADL's  Needs assistance/impaired  Grooming Wash/dry hands;Brushing hair;Standing;Min guard  Upper Body Dressing  Set up;Sitting  Toilet Transfer Ambulation;Requires wide/bariatric;Minimal assistance  Toileting- Clothing Manipulation and Hygiene Minimal  assistance;Sit to/from stand  Functional mobility during ADLs Min guard;Rolling walker  General ADL Comments Pt without interest in AE for LB ADL, prefers to ask for assistance at home or pull his leg up upon the bed as he was prior to admission.  Bed Mobility  General bed mobility comments pt received in chair  Balance  Overall balance assessment Needs assistance  Sitting balance-Leahy Scale Good  Standing balance-Leahy Scale Fair  Standing balance comment statically at sink, reliant on RW for dynamic   Restrictions  RLE Weight Bearing WBAT  LLE Weight Bearing WBAT  Transfers  Overall transfer level Needs assistance  Equipment used Rolling walker (2 wheeled)  Transfers Sit to/from Stand  Sit to Stand Min guard;From elevated surface  General transfer comment use of momentum, elevated surface, increased time and effort  OT - End of Session  Equipment Utilized During Treatment Gait belt;Rolling walker  Activity Tolerance Patient tolerated treatment well  Patient left in chair;with call bell/phone within reach;with chair alarm set  OT Assessment/Plan  OT Plan Discharge plan remains appropriate  OT Visit Diagnosis Unsteadiness on feet (R26.81);Other abnormalities of gait and mobility (R26.89);History of falling (Z91.81);Muscle weakness (generalized) (M62.81);Pain;Other symptoms and signs involving cognitive function  OT Frequency (ACUTE ONLY) Min 2X/week  Follow Up Recommendations Home health OT  OT Equipment None recommended by OT  AM-PAC OT "6 Clicks" Daily Activity Outcome Measure (Version 2)  Help from another person eating meals? 4  Help from another person taking care of personal grooming? 3  Help from another person toileting, which includes using toliet, bedpan, or urinal? 3  Help from another person bathing (including washing, rinsing, drying)? 3  Help from another person to put on and taking off regular upper body clothing? 4  Help from another person  to put on and taking off  regular lower body clothing? 3  6 Click Score 20  OT Goal Progression  Progress towards OT goals Progressing toward goals  Acute Rehab OT Goals  Patient Stated Goal Go home  OT Goal Formulation With patient  Time For Goal Achievement 01/12/20  Potential to Achieve Goals Good  OT Time Calculation  OT Start Time (ACUTE ONLY) 1227  OT Stop Time (ACUTE ONLY) 1245  OT Time Calculation (min) 18 min  OT General Charges  $OT Visit 1 Visit  OT Treatments  $Self Care/Home Management  8-22 mins  Martie Round, OTR/L Acute Rehabilitation Services Pager: [phone] Office: [phone]

## 2020-01-02 NOTE — Progress Notes (Signed)
PROGRESS NOTE  Robert Frank GGE:[id number] DOB: Nov 02, 1965 DOA: 12/15/2019 PCP: Patient, No Pcp Per  HPI/Recap of past 24 hours: 54 y.o.WM PMHxCVA and EtOH abuse who presented to [facility] ED for evaluation of right knee injury after a fall.   In the ER, found to have bilateral lower extremity erythema and swelling. When compared to the previous week when he was admitted, pictures in the chart, his erythema and swelling worsened. Hyponatremia with a serum sodium of 122. TRH, Hospitalist team was asked to admit for further evaluation and management.  Presented with left calf abscess and bilateral lower extremity cellulitis.  Had I&D done on 12/21/2019 by Dr. August Saucer.  I&D repeated on 12/28/2019 by Dr. Lajoyce Corners.  Was on IV linezolid empirically up to 7/23 then switched to oral Linezolid.Marland Kitchen  MRSA negative on 12/06/2019.  Hospital course complicated by AKI suspected multifactorial in the setting of infection, ACE inhibitor, and IV vancomycin.  He received IV fluid hydration which was stopped due to concern for volume overload.  Fell morning of 12/25/19, noticed small bump on the back of his head, no other injuries noted.  Fall precautions in place.  He denies any pain.  States he falls a lot.  01/02/20: Seen and examined.  POD #5 post repeat I&D LLE. No new complaints.  TOC assisting with safe disposition. Cr back to baseline.   Assessment/Plan: Principal Problem:   Bilateral cellulitis of lower leg Active Problems:   COPD (chronic obstructive pulmonary disease) (HCC)   Alcoholism (HCC)   Generalized weakness   Hyponatremia   Bilateral lower leg cellulitis   Essential hypertension   Anemia of chronic disease   Abscess of left lower leg  Bilateral lower extremity cellulitis with abscess in the left calf area post I&D x 2 Patient was initially started on vancomycin and Zosyn. However due to worsening renal function was changed over to linezolid and zosyn was stopped. MRI was obtained which  showedevidence for cellulitis but also shows an abscess in the left lower leg. Patient was taken to the OR 7/14 for I&D by Dr. August Saucer. Repeat I&D by Dr. Lajoyce Corners on 12/28/2019  IV linezolid switched to oral on 12/30/2019  Hypovolemic hyponatremia, mild Asymptomatic Serum sodium 134 on IV fluid normal saline at 75 cc/h Continue to monitor  Nonoliguric acute kidney injury Baseline creatinine is 0.6 with GFR greater than 60. Acute renal failure likely multifactorial, in the setting of infection, ACE inhibitor, IV vancomycin. Downtrending creatinine 1.8 from 2.18 from 2.29\ 1.7 L urine output recorded in the last 24 hours. Continue to avoid nephrotoxins and hypotension. Echocardiogram showed normal systolic function. Appreciate nephrology's assistance  History of COPD Stable O2 saturation 94 to 98% on room  Resolved right upper extremity discomfort, unclear etiology No significant edema on exam  Essential hypertension Blood pressure stable Continue metoprolol 75 mg twice daily, hydralazine 50 mg 3 times daily Continue to hold off lisinopril due to AKI. Continue to monitor vital signs.  Alcohol abuse No evidence of alcohol withdrawal at the time of this visit Continue on CIWA protocol Continue multivitamins, folic acid, thiamine  Anemia of chronic disease Hemoglobin 9.8 Baseline hemoglobin 10.6  Ambulatory dysfunction with fall Fell on 12/25/2019 PT assessment recommended home health PT OT TOC assisting with home health services and disposition. Continue PT OT with assistance and fall precautions.  Morbid obesity BMI 40 Recommend weight loss outpatient with regular physical activity 30 to 60 minutes a day 5 times a week and healthy dieting  Poor  social support Per report patient has had no electricity since 2014  Still no electricity in his home TOC working on safe disposition   DVT prophylaxis:Lovenox subcu daily Code Status:Full code Family Communication:  None at bedside  Status is: Inpatient   Dispo:             Patient From: Home             Planned Disposition: SNF             Expected discharge date: 01/03/20             Medically stable for discharge: yes, pending safe disposition.  Objective: Vitals:   01/01/20 1540 01/01/20 1920 01/02/20 0400 01/02/20 0806  BP: (!) 148/95 (!) 120/87 (!) 133/85 121/83  Pulse: 87 95 87 90  Resp:  17 18 17   Temp:  98.4 F (36.9 C) 98.4 F (36.9 C) 98.6 F (37 C)  TempSrc:  Oral Oral Oral  SpO2:  98% 98% 97%  Weight:      Height:        Intake/Output Summary (Last 24 hours) at 01/02/2020 1604 Last data filed at 01/02/2020 0700 Gross per 24 hour  Intake 240 ml  Output 1100 ml  Net -860 ml   Filed Weights   12/26/19 1800 12/27/19 0500 12/28/19 0629  Weight: 119.8 kg 116.5 kg 116.7 kg    Exam: No significant changes from previous exam. . General: 54 y.o. year-old male obese in no acute distress.  Alert and oriented x3. . Cardiovascular: Regular rate and rhythm no rubs or gallops noted . Respiratory: Clear to Auscultation No Wheezes or Rales.   . Abdomen: Obese nontender normal bowel sounds present.   . Musculoskeletal: Trace lower extremity edema bilaterally.  Left lower extremity in surgical dressing.   44 Psychiatry: Mood is appropriate for condition and setting.   Data Reviewed: CBC: Recent Labs  Lab 12/27/19 0455 12/28/19 0914 12/29/19 0254 12/30/19 0441 12/31/19 0259  WBC 8.8 8.3 7.5 7.7 7.8  HGB 10.6* 10.6* 10.3* 9.8* 10.2*  HCT 33.2* 32.5* 31.8* 30.7* 31.9*  MCV 105.4* 105.9* 105.6* 106.2* 106.0*  PLT 281 259 247 214 196   Basic Metabolic Panel: Recent Labs  Lab 12/28/19 0914 12/29/19 0254 12/30/19 0441 12/31/19 0259 01/02/20 0611  NA 133* 132* 134* 134* 136  K 4.2 4.7 4.3 4.3 4.2  CL 102 101 102 101 103  CO2 23 20* 23 24 25   GLUCOSE 105* 127* 90 109* 116*  BUN 14 20 25* 23* 22*  CREATININE 1.95* 2.29* 2.18* 1.88* 1.42*  CALCIUM 8.4* 8.3* 8.3* 8.4*  8.3*   GFR: Estimated Creatinine Clearance: 74.3 mL/min (A) (by C-G formula based on SCr of 1.42 mg/dL (H)). Liver Function Tests: No results for input(s): AST, ALT, ALKPHOS, BILITOT, PROT, ALBUMIN in the last 168 hours. No results for input(s): LIPASE, AMYLASE in the last 168 hours. No results for input(s): AMMONIA in the last 168 hours. Coagulation Profile: No results for input(s): INR, PROTIME in the last 168 hours. Cardiac Enzymes: No results for input(s): CKTOTAL, CKMB, CKMBINDEX, TROPONINI in the last 168 hours. BNP (last 3 results) No results for input(s): PROBNP in the last 8760 hours. HbA1C: No results for input(s): HGBA1C in the last 72 hours. CBG: No results for input(s): GLUCAP in the last 168 hours. Lipid Profile: No results for input(s): CHOL, HDL, LDLCALC, TRIG, CHOLHDL, LDLDIRECT in the last 72 hours. Thyroid Function Tests: No results for input(s): TSH, T4TOTAL, FREET4, T3FREE,  THYROIDAB in the last 72 hours. Anemia Panel: No results for input(s): VITAMINB12, FOLATE, FERRITIN, TIBC, IRON, RETICCTPCT in the last 72 hours. Urine analysis:    Component Value Date/Time   COLORURINE YELLOW 12/19/2019 1800   APPEARANCEUR HAZY (A) 12/19/2019 1800   LABSPEC 1.008 12/19/2019 1800   PHURINE 5.0 12/19/2019 1800   GLUCOSEU NEGATIVE 12/19/2019 1800   HGBUR MODERATE (A) 12/19/2019 1800   BILIRUBINUR NEGATIVE 12/19/2019 1800   KETONESUR NEGATIVE 12/19/2019 1800   PROTEINUR 30 (A) 12/19/2019 1800   NITRITE NEGATIVE 12/19/2019 1800   LEUKOCYTESUR NEGATIVE 12/19/2019 1800   Sepsis Labs: @LABRCNTIP (procalcitonin:4,lacticidven:4)  ) No results found for this or any previous visit (from the past 240 hour(s)).    Studies: No results found.  Scheduled Meds: . aspirin EC  81 mg Oral Daily  . docusate sodium  100 mg Oral BID  . enoxaparin (LOVENOX) injection  50 mg Subcutaneous Q24H  . folic acid  1 mg Oral Daily  . gabapentin  100 mg Oral BID  . hydrALAZINE  50 mg Oral  Q8H  . linezolid  600 mg Oral Q12H  . metoprolol tartrate  75 mg Oral BID  . multivitamin with minerals  1 tablet Oral Daily  . thiamine  100 mg Oral Daily    Continuous Infusions: . sodium chloride Stopped (12/25/19 1734)  . sodium chloride 75 mL/hr at 12/28/19 1727  . methocarbamol (ROBAXIN) IV       LOS: 17 days     12/30/19, MD [facility] Pager [phone]  If 7PM-7AM, please contact night-coverage [email or web address] Password [facility] 01/02/2020, 4:04 PM

## 2020-01-03 NOTE — Progress Notes (Signed)
Physical Therapy Treatment Patient Details Name: Robert Frank MRN: [id number] DOB: 1965-06-16 Today's Date: 01/03/2020    History of Present Illness Pt is a 54 y.o. male admitted 12/15/19 with fall sustaining R knee pain, reports weakness with several falls. Workup for BLE cellulitis. S/p L calf abscess I&D on 7/14 and 7/21. Hospital course complicated by AKI. PMH includes CVA, COPD, obesity, alcoholism.   PT Comments    Pt progressing with mobility. Ambulatory with RW and intermittent min guard; pt declining stair training while admitted. Educ on fall risk reduction, safe stair navigation and recommendations for assist from family. Pt reports having necessary assist, as well as already owns a RW and w/c (CM notified). Pt hopeful for d/c home soon. Continue to recommend HHPT services.   Follow Up Recommendations  Home health PT;Supervision/Assistance - 24 hour     Equipment Recommendations  None recommended by PT    Recommendations for Other Services       Precautions / Restrictions Precautions Precautions: Fall Precaution Comments: multiple wounds on buttocks and LEs; unna boot pushed down to L ankle    Mobility  Bed Mobility               General bed mobility comments: Received sitting EOB  Transfers Overall transfer level: Needs assistance Equipment used: Rolling walker (2 wheeled) Transfers: Sit to/from Stand Sit to Stand: Min guard;From elevated surface         General transfer comment: Reliant on momentum, increased time and effort  Ambulation/Gait Ambulation/Gait assistance: Min guard Gait Distance (Feet): 160 Feet Assistive device: Rolling walker (2 wheeled) Gait Pattern/deviations: Step-through pattern;Decreased stride length;Trunk flexed;Antalgic Gait velocity: Decreased   General Gait Details: Slow, antalgic gait with RW and min guard; no knee instability noted this session. Pt declined further distance secondary to pain and  fatigue   Stairs Stairs:  (Pt declined stair training; reports he does not want to practice stairs while admitted (has 3 steps to enter w/ rail; encouraged assistance from family member, educ on technique))           Wheelchair Mobility    Modified Rankin (Stroke Patients Only)       Balance Overall balance assessment: Needs assistance Sitting-balance support: Feet supported;No upper extremity supported Sitting balance-Leahy Scale: Good     Standing balance support: During functional activity Standing balance-Leahy Scale: Fair Standing balance comment: Can static stand without UE support; reliant on UE support for dynamic stability                            Cognition Arousal/Alertness: Awake/alert Behavior During Therapy: Flat affect Overall Cognitive Status: No family/caregiver present to determine baseline cognitive functioning Area of Impairment: Awareness;Problem solving;Safety/judgement;Following commands;Attention                   Current Attention Level: Sustained;Selective   Following Commands: Follows one step commands with increased time Safety/Judgement: Decreased awareness of safety;Decreased awareness of deficits Awareness: Emergent Problem Solving: Slow processing;Requires verbal cues General Comments: pt minimally verbal      Exercises      General Comments General comments (skin integrity, edema, etc.): Pt reports owning RW and w/c - case manager notified pt does not need this equipment      Pertinent Vitals/Pain Pain Assessment: Faces Faces Pain Scale: Hurts little more Pain Location: Back > LEs Pain Descriptors / Indicators: Grimacing;Guarding;Discomfort;Constant Pain Intervention(s): Monitored during session;Limited activity within patient's tolerance  Home Living                      Prior Function            PT Goals (current goals can now be found in the care plan section) Progress towards PT goals:  Progressing toward goals    Frequency    Min 3X/week      PT Plan Equipment recommendations need to be updated    Co-evaluation              AM-PAC PT "6 Clicks" Mobility   Outcome Measure  Help needed turning from your back to your side while in a flat bed without using bedrails?: None Help needed moving from lying on your back to sitting on the side of a flat bed without using bedrails?: None Help needed moving to and from a bed to a chair (including a wheelchair)?: A Little Help needed standing up from a chair using your arms (e.g., wheelchair or bedside chair)?: A Little Help needed to walk in hospital room?: A Little Help needed climbing 3-5 steps with a railing? : A Little 6 Click Score: 20    End of Session Equipment Utilized During Treatment: Gait belt Activity Tolerance: Patient tolerated treatment well;Patient limited by pain Patient left: in chair;with call bell/phone within reach;with chair alarm set Nurse Communication: Mobility status PT Visit Diagnosis: Unsteadiness on feet (R26.81);Other abnormalities of gait and mobility (R26.89);History of falling (Z91.81);Pain     Time: 5009-3818 PT Time Calculation (min) (ACUTE ONLY): 20 min  Charges:  $Therapeutic Exercise: 8-22 mins                     [name], PT, DPT Acute Rehabilitation Services  Pager [phone] Office [phone]  [name] 01/03/2020, 3:54 PM

## 2020-01-03 NOTE — Progress Notes (Signed)
PROGRESS NOTE  Robert Frank JXB:[id number] DOB: September 10, 1965 DOA: 12/15/2019 PCP: Patient, No Pcp Per  HPI/Recap of past 24 hours: 54 y.o.WM PMHxCVA and EtOH abuse who presented to [facility] ED for evaluation of right knee injury after a fall.   In the ER, found to have bilateral lower extremity erythema and swelling. When compared to the previous week when he was admitted, pictures in the chart, his erythema and swelling worsened. Hyponatremia with a serum sodium of 122. Left calf abscess and bilateral lower extremity cellulitis.  Had I&D done on 12/21/2019 by Dr. August Saucer.  I&D repeated on 12/28/2019 by Dr. Lajoyce Corners.  Was on IV linezolid empirically up to 7/23 then switched to oral Linezolid.  MRSA negative on 12/06/2019.  Hospital course complicated by AKI suspected multifactorial in the setting of infection, ACE inhibitor, and IV vancomycin.  He received IV fluid hydration which was stopped due to concern for volume overload.  Fell morning of 12/25/19, noticed small bump on the back of his head, no other injuries noted.  Working with PT with fall precautions.  Unsafe discharge due to social barriers.  TOC assisting.  01/03/20: Seen and examined.  POD #6 post repeat I&D LLE. No new complaints.  TOC assisting with safe disposition. Cr back to baseline.   Assessment/Plan: Principal Problem:   Bilateral cellulitis of lower leg Active Problems:   COPD (chronic obstructive pulmonary disease) (HCC)   Alcoholism (HCC)   Generalized weakness   Hyponatremia   Bilateral lower leg cellulitis   Essential hypertension   Anemia of chronic disease   Abscess of left lower leg  Bilateral lower extremity cellulitis with abscess in the left calf area post I&D x 2 Patient was initially started on vancomycin and Zosyn. However due to worsening renal function was changed over to linezolid and zosyn was stopped. MRI was obtained which showedevidence for cellulitis but also shows an abscess in the left lower  leg. Patient was taken to the OR 7/14 for I&D by Dr. August Saucer. Repeat I&D by Dr. Lajoyce Corners on 12/28/2019  IV linezolid switched to oral on 12/30/2019 x 5 days.  Resolved hypovolemic hyponatremia, mild Serum sodium 136  Nonoliguric acute kidney injury Baseline creatinine is 0.6 with GFR greater than 60. Acute renal failure likely multifactorial, in the setting of infection, ACE inhibitor, IV vancomycin. Downtrending creatinine 1.4 from 1.8 from 2.18 from 2.29 Inaccurate urine output recorded Continue to avoid nephrotoxins and hypotension. Echocardiogram showed normal systolic function. Seen by nephrology, signed off.  History of COPD Stable O2 saturation 94 to 98% on room  Resolved right upper extremity discomfort, unclear etiology  Essential hypertension Blood pressure stable Continue metoprolol 75 mg twice daily, hydralazine 50 mg 3 times daily Continue to hold off lisinopril due to AKI. Continue to monitor vital signs.  Alcohol abuse No evidence of alcohol withdrawal at the time of this visit Continue on CIWA protocol Continue multivitamins, folic acid, thiamine  Anemia of chronic disease Hemoglobin 9.8> 10.2 on 12/31/2019 Baseline hemoglobin 10.6 No overt bleeding  Ambulatory dysfunction with fall Fell on 12/25/2019 PT assessment recommended home health PT OT TOC assisting with safe disposition with home health services. Continue PT OT with assistance and fall precautions.  Morbid obesity BMI 40 Recommend weight loss outpatient with regular physical activity 30 to 60 minutes a day 5 times a week and healthy dieting  Poor social support Per report patient has had no electricity since 2014  Still no electricity in his home TOC working on safe  disposition   DVT prophylaxis:Lovenox subcu daily Code Status:Full code Family Communication: None at bedside  Status is: Inpatient   Dispo:             Patient From: Home             Planned Disposition: SNF              Expected discharge date: 01/03/20             Medically stable for discharge: yes, pending safe disposition.  Objective: Vitals:   01/02/20 2255 01/02/20 2256 01/03/20 0924 01/03/20 1400  BP: (!) 155/88 (!) 155/88 (!) 152/94 (!) 161/98  Pulse:  90 95 70  Resp:   18 16  Temp:   97.8 F (36.6 C) 97.8 F (36.6 C)  TempSrc:   Oral Oral  SpO2:   100% 98%  Weight:      Height:        Intake/Output Summary (Last 24 hours) at 01/03/2020 1430 Last data filed at 01/02/2020 2314 Gross per 24 hour  Intake --  Output 400 ml  Net -400 ml   Filed Weights   12/26/19 1800 12/27/19 0500 12/28/19 0629  Weight: 119.8 kg 116.5 kg 116.7 kg    . Exam: . General: 54 y.o. year-old male obese in no acute distress.  Alert and oriented x3. . Cardiovascular: Regular rate and rhythm no rubs or gallops. Marland Kitchen Respiratory: Clear to auscultation no wheezes or rales.   . Abdomen: Obese nontender normal bowel sounds present.   . Musculoskeletal: Trace lower extremity edema bilaterally.  \ . Psychiatry: Mood is appropriate for condition and setting.  Data Reviewed: CBC: Recent Labs  Lab 12/28/19 0914 12/29/19 0254 12/30/19 0441 12/31/19 0259  WBC 8.3 7.5 7.7 7.8  HGB 10.6* 10.3* 9.8* 10.2*  HCT 32.5* 31.8* 30.7* 31.9*  MCV 105.9* 105.6* 106.2* 106.0*  PLT 259 247 214 196   Basic Metabolic Panel: Recent Labs  Lab 12/28/19 0914 12/29/19 0254 12/30/19 0441 12/31/19 0259 01/02/20 0611  NA 133* 132* 134* 134* 136  K 4.2 4.7 4.3 4.3 4.2  CL 102 101 102 101 103  CO2 23 20* 23 24 25   GLUCOSE 105* 127* 90 109* 116*  BUN 14 20 25* 23* 22*  CREATININE 1.95* 2.29* 2.18* 1.88* 1.42*  CALCIUM 8.4* 8.3* 8.3* 8.4* 8.3*   GFR: Estimated Creatinine Clearance: 74.3 mL/min (A) (by C-G formula based on SCr of 1.42 mg/dL (H)). Liver Function Tests: No results for input(s): AST, ALT, ALKPHOS, BILITOT, PROT, ALBUMIN in the last 168 hours. No results for input(s): LIPASE, AMYLASE in the last 168  hours. No results for input(s): AMMONIA in the last 168 hours. Coagulation Profile: No results for input(s): INR, PROTIME in the last 168 hours. Cardiac Enzymes: No results for input(s): CKTOTAL, CKMB, CKMBINDEX, TROPONINI in the last 168 hours. BNP (last 3 results) No results for input(s): PROBNP in the last 8760 hours. HbA1C: No results for input(s): HGBA1C in the last 72 hours. CBG: No results for input(s): GLUCAP in the last 168 hours. Lipid Profile: No results for input(s): CHOL, HDL, LDLCALC, TRIG, CHOLHDL, LDLDIRECT in the last 72 hours. Thyroid Function Tests: No results for input(s): TSH, T4TOTAL, FREET4, T3FREE, THYROIDAB in the last 72 hours. Anemia Panel: No results for input(s): VITAMINB12, FOLATE, FERRITIN, TIBC, IRON, RETICCTPCT in the last 72 hours. Urine analysis:    Component Value Date/Time   COLORURINE YELLOW 12/19/2019 1800   APPEARANCEUR HAZY (A) 12/19/2019 1800  LABSPEC 1.008 12/19/2019 1800   PHURINE 5.0 12/19/2019 1800   GLUCOSEU NEGATIVE 12/19/2019 1800   HGBUR MODERATE (A) 12/19/2019 1800   BILIRUBINUR NEGATIVE 12/19/2019 1800   KETONESUR NEGATIVE 12/19/2019 1800   PROTEINUR 30 (A) 12/19/2019 1800   NITRITE NEGATIVE 12/19/2019 1800   LEUKOCYTESUR NEGATIVE 12/19/2019 1800   Sepsis Labs: @LABRCNTIP (procalcitonin:4,lacticidven:4)  ) No results found for this or any previous visit (from the past 240 hour(s)).    Studies: No results found.  Scheduled Meds: . aspirin EC  81 mg Oral Daily  . docusate sodium  100 mg Oral BID  . enoxaparin (LOVENOX) injection  50 mg Subcutaneous Q24H  . folic acid  1 mg Oral Daily  . gabapentin  100 mg Oral BID  . hydrALAZINE  50 mg Oral Q8H  . linezolid  600 mg Oral Q12H  . metoprolol tartrate  75 mg Oral BID  . multivitamin with minerals  1 tablet Oral Daily  . thiamine  100 mg Oral Daily    Continuous Infusions: . sodium chloride Stopped (12/25/19 1734)  . methocarbamol (ROBAXIN) IV       LOS: 18 days      12/27/19, MD [facility] Pager [phone]  If 7PM-7AM, please contact night-coverage [email or web address] Password [email or web address] 01/03/2020, 2:30 PM

## 2020-01-03 NOTE — Plan of Care (Signed)
  Problem: Education: Goal: Knowledge of General Education information will improve Description: Including pain rating scale, medication(s)/side effects and non-pharmacologic comfort measures Outcome: Progressing  Dressing changes will be provided daily  Problem: Clinical Measurements: Goal: Will remain free from infection Outcome: Progressing  Patient afebrile this shift.  Problem: Nutrition: Goal: Adequate nutrition will be maintained Outcome: Progressing  Patient tolerates >50% of meal.

## 2020-01-03 NOTE — Care Management (Signed)
    Durable Medical Equipment  (From admission, onward)         Start     Ordered   12/26/19 0606  For home use only DME standard manual wheelchair with seat cushion  Once       Comments: Patient suffers from ambulatory dysfunction which impairs their ability to perform daily activities like walking in the home.  A walking aid will not resolve issue with performing activities of daily living. A wheelchair will allow patient to safely perform daily activities. Patient can safely propel the wheelchair in the home or has a caregiver who can provide assistance. Length of need 6 months. Accessories: elevating leg rests, wheel locks, extensions and anti-tippers.   12/26/19 0606

## 2020-01-04 LAB — BASIC METABOLIC PANEL
Anion gap: 8 (ref 5–15)
BUN: 19 mg/dL (ref 6–20)
CO2: 27 mmol/L (ref 22–32)
Calcium: 8.6 mg/dL — ABNORMAL LOW (ref 8.9–10.3)
Chloride: 103 mmol/L (ref 98–111)
Creatinine, Ser: 1.23 mg/dL (ref 0.61–1.24)
GFR calc Af Amer: >60 mL/min (ref >60)
GFR calc non Af Amer: >60 mL/min (ref >60)
Glucose, Bld: 102 mg/dL — ABNORMAL HIGH (ref 70–99)
Potassium: 4.4 mmol/L (ref 3.5–5.1)
Sodium: 138 mmol/L (ref 135–145)

## 2020-01-04 MED ORDER — METOPROLOL TARTRATE 75 MG PO TABS: 75.0000 mg | ORAL_TABLET | Freq: Two times a day (BID) | ORAL | 3 refills | Status: DC

## 2020-01-04 MED ORDER — GABAPENTIN 100 MG PO CAPS: 100.0000 mg | ORAL_CAPSULE | Freq: Two times a day (BID) | ORAL | 3 refills | Status: DC

## 2020-01-04 MED ORDER — ASPIRIN 81 MG PO TBEC: 81.0000 mg | DELAYED_RELEASE_TABLET | Freq: Every day | ORAL | 0 refills | Status: AC

## 2020-01-04 MED ORDER — LACTULOSE 10 GM/15ML PO SOLN: 20.0000 g | Freq: Every day | ORAL | 0 refills | Status: AC

## 2020-01-04 MED FILL — ASPIRIN LOW DOSE 81 MG TBEC: 81 | 90 days supply | Qty: 90 | Fill #0

## 2020-01-04 MED FILL — METOPROLOL TARTRATE 25 MG T: 25 | 30 days supply | Qty: 180 | Fill #0

## 2020-01-04 MED FILL — LACTULOSE 10 GM/15 ML SOLN: 10 | 31 days supply | Qty: 946 | Fill #0

## 2020-01-04 MED FILL — GABAPENTIN 100 MG CAPSULE: 100 | 30 days supply | Qty: 60 | Fill #0

## 2020-01-04 NOTE — TOC Progression Note (Signed)
Transition of Care [facility]) - Progression Note    Patient Details  Name: Robert Frank MRN: [id number] Date of Birth: August 12, 1965  [facility] CM/SW Contact  Janae Bridgeman, RN Phone Number: 01/04/2020, 9:53 AM  Clinical Narrative:    Case management called and left a message with the patient's pastor, Ritta Slot, to inquire as to providing the patient assistance inside the home and/or transportation.  The patient will be discharged possibly today.   Expected Discharge Plan: Home/Self Care Barriers to Discharge: Continued Medical Work up  Expected Discharge Plan and Services Expected Discharge Plan: Home/Self Care In-house Referral: Artist, PCP / [facility] Discharge Planning Services: CM Consult, Follow-up appt scheduled Post Acute Care Choice: NA Living arrangements for the past 2 months: Single Family Home                           HH Arranged: NA           Social Determinants of Health (SDOH) Interventions    Readmission Risk Interventions Readmission Risk Prevention Plan 12/19/2019  Transportation Screening Complete  PCP or Specialist Appt within 5-7 Days Complete  Home Care Screening Complete  Medication Review (RN CM) Complete  Some recent data might be hidden

## 2020-01-04 NOTE — Plan of Care (Signed)
Problem: Education: Goal: Knowledge of General Education information will improve Description: Including pain rating scale, medication(s)/side effects and non-pharmacologic comfort measures 01/04/2020 1703 by Lenise Arena, RN Outcome: Adequate for Discharge 01/04/2020 1703 by Lenise Arena, RN Outcome: Progressing 01/04/2020 0852 by Lenise Arena, RN Outcome: Progressing   Problem: Health Behavior/Discharge Planning: Goal: Ability to manage health-related needs will improve 01/04/2020 1703 by Lenise Arena, RN Outcome: Adequate for Discharge 01/04/2020 1703 by Lenise Arena, RN Outcome: Progressing 01/04/2020 0852 by Lenise Arena, RN Outcome: Progressing   Problem: Clinical Measurements: Goal: Ability to maintain clinical measurements within normal limits will improve 01/04/2020 1703 by Lenise Arena, RN Outcome: Adequate for Discharge 01/04/2020 1703 by Lenise Arena, RN Outcome: Progressing 01/04/2020 0852 by Lenise Arena, RN Outcome: Progressing Goal: Will remain free from infection 01/04/2020 1703 by Lenise Arena, RN Outcome: Adequate for Discharge 01/04/2020 1703 by Lenise Arena, RN Outcome: Progressing 01/04/2020 0852 by Lenise Arena, RN Outcome: Progressing Goal: Diagnostic test results will improve 01/04/2020 1703 by Lenise Arena, RN Outcome: Adequate for Discharge 01/04/2020 1703 by Lenise Arena, RN Outcome: Progressing 01/04/2020 0852 by Lenise Arena, RN Outcome: Progressing Goal: Respiratory complications will improve 01/04/2020 1703 by Lenise Arena, RN Outcome: Adequate for Discharge 01/04/2020 1703 by Lenise Arena, RN Outcome: Progressing 01/04/2020 0852 by Lenise Arena, RN Outcome: Progressing Goal: Cardiovascular complication will be avoided 01/04/2020 1703 by Lenise Arena, RN Outcome: Adequate for Discharge 01/04/2020 1703 by Lenise Arena, RN Outcome: Progressing 01/04/2020 0852 by Lenise Arena, RN Outcome: Progressing   Problem: Activity: Goal:  Risk for activity intolerance will decrease 01/04/2020 1703 by Lenise Arena, RN Outcome: Adequate for Discharge 01/04/2020 1703 by Lenise Arena, RN Outcome: Progressing 01/04/2020 0852 by Lenise Arena, RN Outcome: Progressing   Problem: Nutrition: Goal: Adequate nutrition will be maintained 01/04/2020 1703 by Lenise Arena, RN Outcome: Adequate for Discharge 01/04/2020 1703 by Lenise Arena, RN Outcome: Progressing 01/04/2020 0852 by Lenise Arena, RN Outcome: Progressing   Problem: Coping: Goal: Level of anxiety will decrease 01/04/2020 1703 by Lenise Arena, RN Outcome: Adequate for Discharge 01/04/2020 1703 by Lenise Arena, RN Outcome: Progressing 01/04/2020 0852 by Lenise Arena, RN Outcome: Progressing   Problem: Elimination: Goal: Will not experience complications related to bowel motility 01/04/2020 1703 by Lenise Arena, RN Outcome: Adequate for Discharge 01/04/2020 1703 by Lenise Arena, RN Outcome: Progressing 01/04/2020 0852 by Lenise Arena, RN Outcome: Progressing Goal: Will not experience complications related to urinary retention 01/04/2020 1703 by Lenise Arena, RN Outcome: Adequate for Discharge 01/04/2020 1703 by Lenise Arena, RN Outcome: Progressing 01/04/2020 0852 by Lenise Arena, RN Outcome: Progressing   Problem: Pain Managment: Goal: General experience of comfort will improve 01/04/2020 1703 by Lenise Arena, RN Outcome: Adequate for Discharge 01/04/2020 1703 by Lenise Arena, RN Outcome: Progressing 01/04/2020 0852 by Lenise Arena, RN Outcome: Progressing   Problem: Safety: Goal: Ability to remain free from injury will improve 01/04/2020 1703 by Lenise Arena, RN Outcome: Adequate for Discharge 01/04/2020 1703 by Lenise Arena, RN Outcome: Progressing 01/04/2020 0852 by Lenise Arena, RN Outcome: Progressing   Problem: Skin Integrity: Goal: Risk for impaired skin integrity will decrease 01/04/2020 1703 by Lenise Arena, RN Outcome: Adequate for  Discharge 01/04/2020 1703 by Lenise Arena, RN Outcome: Progressing 01/04/2020 0852 by Lenise Arena, RN  Outcome: Progressing   

## 2020-01-04 NOTE — Consult Note (Signed)
WOC nurse consulted Reviewed notes  Discussed with Dr. Lajoyce Corners Wound care orders in the computer from our previous consult on 12/17/19  Updated orders after discussion with Dr. Lajoyce Corners Bedside nursing should be changing dressing.    Re consult if needed, will not follow at this time. Thanks  Cheng Dec [employer], RN,CWOCN, CNS, CWON-AP [phone])

## 2020-01-04 NOTE — Discharge Summary (Signed)
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 PROBNP in the last 8760 hours.  CBG: No results for input(s): GLUCAP in the last 168 hours.     Signed:  Rhetta Mura MD   [facility] 01/04/2020, 11:57 AM

## 2020-01-04 NOTE — TOC Transition Note (Signed)
Transition of Care [facility]) - CM/SW Discharge Note   Patient Details  Name: Robert Frank MRN: [id number] Date of Birth: 1966/01/03  Transition of Care [facility]) CM/SW Contact:  Janae Bridgeman, RN Phone Number: 01/04/2020, 1:00 PM   Clinical Narrative:     Called and left a message with Ritta Slot, minister at [facility] and did not receive a call back yet.  Plan is to have patient's discharge meds filled at the [facility] pharmacy and delivered to the room prior to discharge.  The patient's appointment with [facility] and Wellness and Dr. Audrie Lia office were arranged and patient stated that the minister checks on the patient at his home daily and should receive a ride to his appointments from the minister.  The patient declined a bus pass for these appointments or [facility] transportation.  The primary nurse, Cicero Duck, will be providing the patient with dressing supplies and teaching for home dressing changes by the patient.  I was unable to provide home health services since the patient has no electricity at home since 2014 and roommates staying in his attic who provide beer and money to the patient for their stay at the house.  I will arrange a [place] Mast to home today.  The patient does not have a phone at the house but the patient states that his brother can open the door and provide the patient with a rolling walker from the [employer] to inside the home.  Final next level of care: Home/Self Care Barriers to Discharge: Continued Medical Work up   Patient Goals and CMS Choice Patient states their goals for this hospitalization and ongoing recovery are:: The patient would like to return home. CMS Medicare.gov Compare Post Acute Care list provided to:: Patient Choice offered to / list presented to : Patient  Discharge Placement                       Discharge Plan and Services In-house Referral: Artist, PCP / [facility] Discharge Planning Services: CM  Consult, Follow-up appt scheduled Post Acute Care Choice: NA                    HH Arranged: NA          Social Determinants of Health (SDOH) Interventions     Readmission Risk Interventions Readmission Risk Prevention Plan 12/19/2019  Transportation Screening Complete  PCP or Specialist Appt within 5-7 Days Complete  Home Care Screening Complete  Medication Review (RN CM) Complete  Some recent data might be hidden

## 2020-01-04 NOTE — Progress Notes (Signed)
Occupational Therapy Treatment Patient Details Name: Robert Frank MRN: [id number] DOB: [birth year] Today's Date: 01/04/2020    History of present illness Pt is a 54 y.o. male admitted 12/15/19 with fall sustaining R knee pain, reports weakness with several falls. Workup for BLE cellulitis. S/p L calf abscess I&D on 7/14 and 7/21. Hospital course complicated by AKI. PMH includes CVA, COPD, obesity, alcoholism.   OT comments  Pt continues to struggle with LB ADL. Issued and instructed in use of long handled bath sponge, reacher, extra wide sock aid and long handled shoe horn. Pt returned demonstration of use.  Per chart, pt does not have access to post acute rehab.  Follow Up Recommendations  No OT follow up    Equipment Recommendations  None recommended by OT    Recommendations for Other Services      Precautions / Restrictions Precautions Precautions: Fall Precaution Comments: multiple wounds on buttocks and LEs Restrictions RLE Weight Bearing: Weight bearing as tolerated LLE Weight Bearing: Weight bearing as tolerated       Mobility Bed Mobility Overal bed mobility: Modified Independent             General bed mobility comments: HOB up  Transfers                      Balance Overall balance assessment: Needs assistance   Sitting balance-Leahy Scale: Good                                     ADL either performed or assessed with clinical judgement   ADL Overall ADL's : Needs assistance/impaired             Lower Body Bathing: Modified independent;Sitting/lateral leans Lower Body Bathing Details (indicate cue type and reason): issued reacher and long handled bath sponge and instructed in use     Lower Body Dressing: Set up;Sitting/lateral leans;Sit to/from stand;With adaptive equipment Lower Body Dressing Details (indicate cue type and reason): issued and instructed in use of reacher, extra wide sock aid and long handled bath  sponge                     Vision       Perception     Praxis      Cognition Arousal/Alertness: Awake/alert Behavior During Therapy: WFL for tasks assessed/performed Overall Cognitive Status: Within Functional Limits for tasks assessed                                 General Comments: pt much more animated and somewhat more talkative today        Exercises     Shoulder Instructions       General Comments      Pertinent Vitals/ Pain       Pain Assessment: No/denies pain  Home Living                                          Prior Functioning/Environment              Frequency  Min 2X/week        Progress Toward Goals  OT Goals(current goals can now be found in the care plan section)  Progress  towards OT goals: Progressing toward goals  Acute Rehab OT Goals Patient Stated Goal: Go home OT Goal Formulation: With patient Time For Goal Achievement: 01/12/20 Potential to Achieve Goals: Good  Plan Discharge plan needs to be updated    Co-evaluation                 AM-PAC OT "6 Clicks" Daily Activity     Outcome Measure   Help from another person eating meals?: None Help from another person taking care of personal grooming?: A Little Help from another person toileting, which includes using toliet, bedpan, or urinal?: A Little Help from another person bathing (including washing, rinsing, drying)?: A Little Help from another person to put on and taking off regular upper body clothing?: None Help from another person to put on and taking off regular lower body clothing?: None 6 Click Score: 21    End of Session    OT Visit Diagnosis: Unsteadiness on feet (R26.81);Other abnormalities of gait and mobility (R26.89);History of falling (Z91.81);Muscle weakness (generalized) (M62.81)   Activity Tolerance Patient tolerated treatment well   Patient Left in bed;with call bell/phone within reach   Nurse  Communication          Time: 7989-2119 OT Time Calculation (min): 21 min  Charges: OT General Charges $OT Visit: 1 Visit OT Treatments $Self Care/Home Management : 8-22 mins  Martie Round, OTR/L Acute Rehabilitation Services Pager: [phone] Office: [phone]   Evern Bio 01/04/2020, 1:01 PM

## 2020-01-04 NOTE — Plan of Care (Signed)

## 2020-01-04 NOTE — Progress Notes (Addendum)
Patient discharged home by [facility].  Wound care instructions went over with patient til he was able to return demonstration.  Discussed importance of washing feet daily and keeping clean as well as keeping fingernails trimmed and cleaned and hands washed.  Sent home several extra soaps.  Showed patient how to wash/dry and apply dressings, what to look for with signs of furthering decline, infection. Wound care instructions were highlighted for patient.  Went over discharge instructions, verbalized understanding.  All personal belongings gathered and sent home with patient as well as extra wound care supplies and bandages.  Patient denied any further questions.  TOC brought up prescriptions and taken with patient.

## 2020-01-06 ENCOUNTER — Emergency Department (HOSPITAL_COMMUNITY)
Admission: EM | Admit: 2020-01-06 | Discharge: 2020-01-07 | Disposition: A | Discharge status: Home / Self Care | Payer: Self-pay | Attending: Emergency Medicine | Admitting: Emergency Medicine

## 2020-01-06 DIAGNOSIS — F17210 Nicotine dependence, cigarettes, uncomplicated: Secondary | ICD-10-CM | POA: Insufficient documentation

## 2020-01-06 DIAGNOSIS — L02416 Cutaneous abscess of left lower limb: Secondary | ICD-10-CM | POA: Insufficient documentation

## 2020-01-06 DIAGNOSIS — R000 Tachycardia, unspecified: Secondary | ICD-10-CM | POA: Insufficient documentation

## 2020-01-06 DIAGNOSIS — Y929 Unspecified place or not applicable: Secondary | ICD-10-CM | POA: Insufficient documentation

## 2020-01-06 DIAGNOSIS — W19XXXA Unspecified fall, initial encounter: Secondary | ICD-10-CM

## 2020-01-06 DIAGNOSIS — I10 Essential (primary) hypertension: Secondary | ICD-10-CM | POA: Insufficient documentation

## 2020-01-06 DIAGNOSIS — Z7982 Long term (current) use of aspirin: Secondary | ICD-10-CM | POA: Insufficient documentation

## 2020-01-06 DIAGNOSIS — R600 Localized edema: Secondary | ICD-10-CM | POA: Insufficient documentation

## 2020-01-06 DIAGNOSIS — Z79899 Other long term (current) drug therapy: Secondary | ICD-10-CM | POA: Insufficient documentation

## 2020-01-06 DIAGNOSIS — W108XXA Fall (on) (from) other stairs and steps, initial encounter: Secondary | ICD-10-CM | POA: Insufficient documentation

## 2020-01-06 DIAGNOSIS — Y939 Activity, unspecified: Secondary | ICD-10-CM | POA: Insufficient documentation

## 2020-01-06 DIAGNOSIS — J449 Chronic obstructive pulmonary disease, unspecified: Secondary | ICD-10-CM | POA: Insufficient documentation

## 2020-01-06 DIAGNOSIS — R5381 Other malaise: Secondary | ICD-10-CM | POA: Insufficient documentation

## 2020-01-06 DIAGNOSIS — Y999 Unspecified external cause status: Secondary | ICD-10-CM | POA: Insufficient documentation

## 2020-01-06 NOTE — ED Triage Notes (Signed)
BIB EMS from home. Patient just DC from hospital post I&D for cellulitis to BLE. Patient also had fall just PTA which is what prompted him to call 911. No thinners, A/OX4

## 2020-01-07 ENCOUNTER — Encounter (HOSPITAL_COMMUNITY): Payer: Self-pay | Admitting: Emergency Medicine

## 2020-01-07 LAB — CBC
HCT: 28.4 % — ABNORMAL LOW (ref 39.0–52.0)
Hemoglobin: 9.2 g/dL — ABNORMAL LOW (ref 13.0–17.0)
MCH: 33.6 pg (ref 26.0–34.0)
MCHC: 32.4 g/dL (ref 30.0–36.0)
MCV: 103.6 fL — ABNORMAL HIGH (ref 80.0–100.0)
Platelets: 141 10*3/uL — ABNORMAL LOW (ref 150–400)
RBC: 2.74 MIL/uL — ABNORMAL LOW (ref 4.22–5.81)
RDW: 13 % (ref 11.5–15.5)
WBC: 8.2 10*3/uL (ref 4.0–10.5)
nRBC: 0 % (ref 0.0–0.2)

## 2020-01-07 LAB — BASIC METABOLIC PANEL
Anion gap: 11 (ref 5–15)
BUN: 8 mg/dL (ref 6–20)
CO2: 21 mmol/L — ABNORMAL LOW (ref 22–32)
Calcium: 8.4 mg/dL — ABNORMAL LOW (ref 8.9–10.3)
Chloride: 102 mmol/L (ref 98–111)
Creatinine, Ser: 1.01 mg/dL (ref 0.61–1.24)
GFR calc Af Amer: >60 mL/min (ref >60)
GFR calc non Af Amer: >60 mL/min (ref >60)
Glucose, Bld: 85 mg/dL (ref 70–99)
Potassium: 3.6 mmol/L (ref 3.5–5.1)
Sodium: 134 mmol/L — ABNORMAL LOW (ref 135–145)

## 2020-01-07 NOTE — Discharge Instructions (Addendum)
It is important to take all of your medicine as directed.  Make sure you are getting plenty of rest, drink a lot of fluids and eating 3 meals a day.  Use Tylenol if needed for pain.  Follow-up with your medical doctor for further care and treatment, as soon as possible.

## 2020-01-07 NOTE — ED Notes (Signed)
Pt called for vitals x2 with no reponse

## 2020-01-07 NOTE — ED Notes (Signed)
Pt back in lobby.

## 2020-01-07 NOTE — ED Provider Notes (Signed)
[facility]   CSN: [id number] Arrival date & time: 01/06/20  2356     History Chief Complaint  Patient presents with  . Leg Pain  . Fall    Robert Frank is a 54 y.o. male.  HPI Patient presents from home, by EMS, for evaluation of injuries from fall.  He states he banged his head and hurt his left knee when he fell, last night.  He states he tumbled down 4 steps and was unable to stand up so he called EMS.  Patient had a prolonged wait in the waiting room, prior to being placed in an examination room.  During that weight he had urinary incontinence while sitting in a wheelchair.  Patient states he has not been taking his medicine since he went home from the hospital, 3 days ago because "I am not began eating pills."  He denies other acute problems, at this time.  Patient had a recent 4-week hospitalization for general weakness, leg edema, and an abscess to the left lower leg.  There are no other known modifying factors.    Past Medical History:  Diagnosis Date  . Alcoholism (HCC)   . Blind left eye   . COPD (chronic obstructive pulmonary disease) (HCC)   . 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 Hypertension     Patient Active Problem List   Diagnosis Date Noted  . Abscess of left lower leg   . Anemia of chronic disease 12/18/2019  . Essential hypertension 12/17/2019  . Bilateral cellulitis of lower leg 12/16/2019  . Bilateral lower leg cellulitis 12/16/2019  . Hypokalemia 12/05/2019  . Hypomagnesemia 12/05/2019  . Generalized weakness 12/04/2019  . Transaminitis 12/04/2019  . Decubitus skin ulcer 12/04/2019  . Hyponatremia 12/04/2019  . Alcohol withdrawal (HCC) 08/17/2019  . Encephalopathy acute 08/17/2019  . Alcoholism (HCC)   . Cellulitis   . COPD exacerbation (HCC) 10/07/2018  . COPD (chronic obstructive pulmonary disease) (HCC) 10/06/2018  . COPD with acute  exacerbation (HCC) 10/06/2018    Past Surgical History:  Procedure Laterality Date  . ANTERIOR CRUCIATE LIGAMENT REPAIR    . I & D EXTREMITY Left 12/21/2019   Procedure: IRRIGATION AND DEBRIDEMENT LEFT CALF ABSCESS;  Surgeon: Cammy Copaean, Gregory Scott, MD;  [facility];  Service: Orthopedics;  Laterality: Left;  . I & D EXTREMITY Left 12/28/2019   Procedure: IRRIGATION AND DEBRIDEMENT LEFT CALF ABSCESS;  Surgeon: Nadara Mustarduda, Marcus V, MD;  [facility];  Service: Orthopedics;  Laterality: Left;       No family history on file.  Social History   Tobacco Use  . Smoking status: Current Every Day Smoker    Packs/day: 1.00  . Smokeless tobacco: Never Used  Substance Use Topics  . Alcohol use: Yes    Comment: 5 40 oz/day  . Drug use: Not Currently    Home Medications Prior to Admission medications   Medication Sig Start Date End Date Taking? Authorizing Provider  aspirin 81 MG EC tablet Take 1 tablet (81 mg total) by mouth daily. Swallow whole. 01/04/20 04/03/20  Rhetta MuraSamtani, Jai-Gurmukh, MD  folic acid (FOLVITE) 1 MG tablet Take 1 mg by mouth daily.    [provider]  gabapentin (NEURONTIN) 100 MG capsule Take 1 capsule (100 mg total) by mouth 2 (two) times daily. 01/04/20   Rhetta MuraSamtani, Jai-Gurmukh, MD  hydrALAZINE (APRESOLINE) 50 MG tablet Take 1 tablet (50 mg  total) by mouth every 8 (eight) hours. 12/31/19 01/30/20  Darlin Drop, DO  lactulose (CHRONULAC) 10 GM/15ML solution Take 30 mLs (20 g total) by mouth daily. 01/04/20 05/03/20  Rhetta Mura, MD  metoprolol tartrate (LOPRESSOR) 25 MG tablet Take 75 mg by mouth 2 (two) times daily. 01/04/20   [provider]  Metoprolol Tartrate 75 MG TABS Take 75 mg by mouth 2 (two) times daily. Patient not taking: Reported on 01/07/2020 01/04/20 02/03/20  Rhetta Mura, MD  Multiple Vitamin (MULTIVITAMIN WITH MINERALS) TABS tablet Take 1 tablet by mouth daily. 12/13/19   Rolly Salter, MD  nicotine (NICODERM CQ - DOSED IN MG/24  HOURS) 14 mg/24hr patch Place 1 patch (14 mg total) onto the skin daily. 12/13/19   Rolly Salter, MD  thiamine 100 MG tablet Take 1 tablet (100 mg total) by mouth daily. 12/13/19   Rolly Salter, MD    Allergies    Patient has no known allergies.  Review of Systems   Review of Systems  All other systems reviewed and are negative.   Physical Exam Updated Vital Signs BP (!) 178/97   Pulse (!) 110   Temp 99 F (37.2 C) (Oral)   Resp 20   Ht 5\' 7"  (1.702 m)   Wt (!) 116.7 kg   SpO2 97%   BMI 40.30 kg/m   Physical Exam Vitals and nursing note reviewed.  Constitutional:      General: He is not in acute distress.    Appearance: He is well-developed. He is obese. He is not ill-appearing, toxic-appearing or diaphoretic.  HENT:     Head: Normocephalic and atraumatic.     Right Ear: External ear normal.     Left Ear: External ear normal.  Eyes:     Conjunctiva/sclera: Conjunctivae normal.     Pupils: Pupils are equal, round, and reactive to light.  Neck:     Trachea: Phonation normal.  Cardiovascular:     Rate and Rhythm: Tachycardia present.  Pulmonary:     Effort: Pulmonary effort is normal.  Abdominal:     General: There is no distension.  Musculoskeletal:     Cervical back: Normal range of motion and neck supple.     Comments: Normal range of motion large joints of arms and legs.  No visible deformities.  Skin:    General: Skin is warm and dry.  Neurological:     Mental Status: He is alert and oriented to person, place, and time.     Cranial Nerves: No cranial nerve deficit.     Sensory: No sensory deficit.     Motor: No abnormal muscle tone.     Coordination: Coordination normal.  Psychiatric:        Mood and Affect: Mood normal.        Behavior: Behavior normal.        Thought Content: Thought content normal.        Judgment: Judgment normal.     ED Results / Procedures / Treatments   Labs (all labs ordered are listed, but only abnormal results are  displayed) Labs Reviewed  CBC - Abnormal; Notable for the following components:      Result Value   RBC 2.74 (*)    Hemoglobin 9.2 (*)    HCT 28.4 (*)    MCV 103.6 (*)    Platelets 141 (*)    All other components within normal limits  BASIC METABOLIC PANEL - Abnormal; Notable for the following  components:   Sodium 134 (*)    CO2 21 (*)    Calcium 8.4 (*)    All other components within normal limits    EKG None  Radiology No results found.  Procedures Procedures (including critical care time)  Medications Ordered in ED Medications - No data to display  ED Course  I have reviewed the triage vital signs and the nursing notes.  Pertinent labs & imaging results that were available during my care of the patient were reviewed by me and considered in my medical decision making (see chart for details).  Clinical Course as of Jan 06 1718  Sat Jan 07, 2020  1232 Normal except sodium low, calcium low  Basic metabolic panel(!) [EW]  1233 Normal except hemoglobin low, MCV high, platelets low  CBC(!) [EW]  1530 Normal except sodium low, CO2 low, calcium low  Basic metabolic panel(!) [EW]  1530 Normal except hemoglobin low, MCV high, platelets low   [EW]    Clinical Course User Index [EW] Mancel Bale, MD   MDM Rules/Calculators/A&P                           Patient Vitals for the past 24 hrs:  BP Temp Temp src Pulse Resp SpO2 Height Weight  01/07/20 1700 -- -- -- (!) 110 20 97 % -- --  01/07/20 1645 -- -- -- (!) 114 (!) 28 100 % -- --  01/07/20 1630 -- -- -- (!) 109 18 98 % -- --  01/07/20 1615 -- -- -- (!) 107 21 98 % -- --  01/07/20 1600 -- -- -- (!) 108 20 99 % -- --  01/07/20 1545 -- -- -- (!) 111 19 99 % -- --  01/07/20 1530 -- -- -- 100 15 98 % -- --  01/07/20 1515 -- -- -- 101 17 99 % -- --  01/07/20 1500 -- -- -- 103 20 98 % -- --  01/07/20 1445 (!) 178/97 -- -- (!) 108 20 99 % -- --  01/07/20 1415 (!) 187/131 -- -- 104 17 99 % -- --  01/07/20 1248 (!)  183/109 -- -- 105 15 98 % -- --  01/07/20 0621 (!) 164/108 99 F (37.2 C) Oral (!) 117 18 96 % -- --  01/07/20 0246 (!) 173/110 -- -- (!) 109 16 96 % -- --  01/07/20 0000 -- -- -- -- -- -- 5\' 7"  (1.702 m) (!) 116.7 kg  01/06/20 2357 (!) 165/111 98.4 F (36.9 C) Oral 101 18 96 % -- --    4:45 PM Reevaluation with update and discussion. After initial assessment and treatment, an updated evaluation reveals he complains of pain in his back, currently.  He has mild right trapezius tenderness.  No tenderness of the posterior cervical spine, head or scalp.  Patient is hungry and will be fed.  He is not sure how he will get home. 01/08/20   Medical Decision Making:  This patient is presenting for evaluation of fall, which does require a range of treatment options, and is a complaint that involves a moderate risk of morbidity and mortality. The differential diagnoses includeInjuries, unstable metabolic status, or infection.. I decided to review old records, and in summary patient was recently hospitalized and discharged after prolonged period time,.  I did not require additional historical information from anyone.  Clinical Laboratory Tests Ordered, included CBC and Metabolic panel. Review indicates mild anemia, unchanged from baseline, otherwise  normal/reassuring.   Critical Interventions-clinical evaluation, laboratory testing, observation.  After These Interventions, the Patient was reevaluated and was found without acute, unstable medical processes.  Patient is apparently not taking his medications, by choice.  He has previously been declined home health services because of his living situation.  At this point there is no indication for hospitalization.   CRITICAL CARE-no Performed by: Mancel Bale  Nursing Notes Reviewed/ Care Coordinated Applicable Imaging Reviewed Interpretation of Laboratory Data incorporated into ED treatment  The patient appears reasonably screened and/or  stabilized for discharge and I doubt any other medical condition or other [facility] requiring further screening, evaluation, or treatment in the ED at this time prior to discharge.  Plan: Home Medications-continue usual; Home Treatments-push diet and fluids; return here if the recommended treatment, does not improve the symptoms; Recommended follow up-PCP, as needed     Final Clinical Impression(s) / ED Diagnoses Final diagnoses:  Fall, initial encounter  Malaise    Rx / DC Orders ED Discharge Orders    None       Mancel Bale, MD 01/07/20 2047

## 2020-01-07 NOTE — ED Notes (Signed)
Pt called for vitals with no response. 

## 2020-01-07 NOTE — ED Notes (Signed)
When brought to room from lobby, pt was covered in urine and feces. States he is incontinent, is not a new problem. Pt's legs and feet are wrapped from previous admission, soaked in urine and dirt. Pt states he lives with his brother, brother unable to help care for him.

## 2020-01-07 NOTE — ED Notes (Signed)
No answer multiple times, moving pt OTF °

## 2020-01-11 ENCOUNTER — Inpatient Hospital Stay: Payer: Self-pay | Admitting: Family

## 2020-02-16 ENCOUNTER — Ambulatory Visit: Payer: Self-pay | Admitting: Internal Medicine

## 2020-08-27 ENCOUNTER — Emergency Department (HOSPITAL_COMMUNITY): Payer: Self-pay

## 2020-08-27 ENCOUNTER — Emergency Department (HOSPITAL_COMMUNITY)
Admission: EM | Admit: 2020-08-27 | Discharge: 2020-08-28 | Disposition: A | Discharge status: Home / Self Care | Payer: Self-pay | Attending: Emergency Medicine | Admitting: Emergency Medicine

## 2020-08-27 DIAGNOSIS — Z79899 Other long term (current) drug therapy: Secondary | ICD-10-CM | POA: Insufficient documentation

## 2020-08-27 DIAGNOSIS — R000 Tachycardia, unspecified: Secondary | ICD-10-CM | POA: Insufficient documentation

## 2020-08-27 DIAGNOSIS — R079 Chest pain, unspecified: Secondary | ICD-10-CM | POA: Insufficient documentation

## 2020-08-27 DIAGNOSIS — J449 Chronic obstructive pulmonary disease, unspecified: Secondary | ICD-10-CM | POA: Insufficient documentation

## 2020-08-27 DIAGNOSIS — F17200 Nicotine dependence, unspecified, uncomplicated: Secondary | ICD-10-CM | POA: Insufficient documentation

## 2020-08-27 DIAGNOSIS — I10 Essential (primary) hypertension: Secondary | ICD-10-CM | POA: Insufficient documentation

## 2020-08-27 LAB — CBC
HCT: 42.7 % (ref 39.0–52.0)
Hemoglobin: 14.2 g/dL (ref 13.0–17.0)
MCH: 33.8 pg (ref 26.0–34.0)
MCHC: 33.3 g/dL (ref 30.0–36.0)
MCV: 101.7 fL — ABNORMAL HIGH (ref 80.0–100.0)
Platelets: 309 10*3/uL (ref 150–400)
RBC: 4.2 MIL/uL — ABNORMAL LOW (ref 4.22–5.81)
RDW: 13 % (ref 11.5–15.5)
WBC: 5.6 10*3/uL (ref 4.0–10.5)
nRBC: 0 % (ref 0.0–0.2)

## 2020-08-27 LAB — BASIC METABOLIC PANEL
Anion gap: 7 (ref 5–15)
BUN: 5 mg/dL — ABNORMAL LOW (ref 6–20)
CO2: 29 mmol/L (ref 22–32)
Calcium: 9 mg/dL (ref 8.9–10.3)
Chloride: 96 mmol/L — ABNORMAL LOW (ref 98–111)
Creatinine, Ser: 0.79 mg/dL (ref 0.61–1.24)
GFR, Estimated: >60 mL/min (ref >60)
Glucose, Bld: 80 mg/dL (ref 70–99)
Potassium: 4 mmol/L (ref 3.5–5.1)
Sodium: 132 mmol/L — ABNORMAL LOW (ref 135–145)

## 2020-08-27 LAB — TROPONIN I (HIGH SENSITIVITY): Troponin I (High Sensitivity): 3 ng/L (ref <18)

## 2020-08-27 NOTE — ED Triage Notes (Signed)
Pt arrives via [facility] with chief complaint of CP. Sudden onset was given asa.  Did drink 40oz beer today.

## 2020-08-28 ENCOUNTER — Emergency Department (HOSPITAL_COMMUNITY)
Admission: EM | Admit: 2020-08-28 | Discharge: 2020-08-28 | Disposition: A | Discharge status: Home / Self Care | Payer: Self-pay | Attending: Emergency Medicine | Admitting: Emergency Medicine

## 2020-08-28 ENCOUNTER — Other Ambulatory Visit: Payer: Self-pay

## 2020-08-28 DIAGNOSIS — I10 Essential (primary) hypertension: Secondary | ICD-10-CM | POA: Insufficient documentation

## 2020-08-28 DIAGNOSIS — Z79899 Other long term (current) drug therapy: Secondary | ICD-10-CM | POA: Insufficient documentation

## 2020-08-28 DIAGNOSIS — Z4801 Encounter for change or removal of surgical wound dressing: Secondary | ICD-10-CM | POA: Insufficient documentation

## 2020-08-28 DIAGNOSIS — Z5189 Encounter for other specified aftercare: Secondary | ICD-10-CM

## 2020-08-28 DIAGNOSIS — J449 Chronic obstructive pulmonary disease, unspecified: Secondary | ICD-10-CM | POA: Insufficient documentation

## 2020-08-28 DIAGNOSIS — F17200 Nicotine dependence, unspecified, uncomplicated: Secondary | ICD-10-CM | POA: Insufficient documentation

## 2020-08-28 LAB — TROPONIN I (HIGH SENSITIVITY): Troponin I (High Sensitivity): 4 ng/L (ref <18)

## 2020-08-28 MED ORDER — LORAZEPAM 2 MG/ML IJ SOLN
1.0000 mg | Freq: Once | INTRAMUSCULAR | Status: AC
  Administered 2020-08-28: 1 mg via INTRAVENOUS
  Filled 2020-08-28: qty 1

## 2020-08-28 MED ORDER — LORAZEPAM 1 MG PO TABS: 1.0000 mg | ORAL_TABLET | Freq: Once | ORAL | Status: DC

## 2020-08-28 MED ORDER — SODIUM CHLORIDE 0.9 % IV BOLUS
500.0000 mL | Freq: Once | INTRAVENOUS | Status: AC
  Administered 2020-08-28: 500 mL via INTRAVENOUS

## 2020-08-28 MED ORDER — THIAMINE HCL 100 MG/ML IJ SOLN
Freq: Once | INTRAVENOUS | Status: AC
  Filled 2020-08-28: qty 1000

## 2020-08-28 NOTE — ED Notes (Signed)
Pt verbalizes understanding of discharge instructions. Opportunity for questions and answers were provided. Pt discharged from the ED.   ?

## 2020-08-28 NOTE — ED Notes (Signed)
Pt resting in bed at this time. Resp E/U. Pt in NAD at this time.

## 2020-08-28 NOTE — ED Provider Notes (Signed)
[facility] EMERGENCY DEPARTMENT Provider Note   CSN: [id number] Arrival date & time: 08/28/20  1027     History Chief Complaint  Patient presents with  . Wound Check    Robert Frank is a 55 y.o. male.  55 year old male with prior medical history as detailed has been checked back into the ED.  Patient was discharged from the ED at about 630 this morning.  Apparently the patient could not get a ride to come and pick him up. He remained in the waiting room for several hours.  He decided to check back in because he was hungry.  He has no other complaint.  Patient denies recurrent chest pain.  He declined social work eval consult.  Patient requests a sandwich.  He reports that he should be able to find a ride home shortly.  The history is provided by the patient and medical records.  Illness Location:  Unable to find a ride home      Past Medical History:  Diagnosis Date  . Alcoholism (HCC)   . Blind left eye   . COPD (chronic obstructive pulmonary disease) (HCC)   . 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 Hypertension     Patient Active Problem List   Diagnosis Date Noted  . Abscess of left lower leg   . Anemia of chronic disease 12/18/2019  . Essential hypertension 12/17/2019  . Bilateral cellulitis of lower leg 12/16/2019  . Bilateral lower leg cellulitis 12/16/2019  . Hypokalemia 12/05/2019  . Hypomagnesemia 12/05/2019  . Generalized weakness 12/04/2019  . Transaminitis 12/04/2019  . Decubitus skin ulcer 12/04/2019  . Hyponatremia 12/04/2019  . Alcohol withdrawal (HCC) 08/17/2019  . Encephalopathy acute 08/17/2019  . Alcoholism (HCC)   . Cellulitis   . COPD exacerbation (HCC) 10/07/2018  . COPD (chronic obstructive pulmonary disease) (HCC) 10/06/2018  . COPD with acute exacerbation (HCC) 10/06/2018    Past Surgical History:  Procedure Laterality Date  . ANTERIOR CRUCIATE LIGAMENT REPAIR     . I & D EXTREMITY Left 12/21/2019   Procedure: IRRIGATION AND DEBRIDEMENT LEFT CALF ABSCESS;  Surgeon: Cammy Copa, MD;  Location: [facility] OR;  Service: Orthopedics;  Laterality: Left;  . I & D EXTREMITY Left 12/28/2019   Procedure: IRRIGATION AND DEBRIDEMENT LEFT CALF ABSCESS;  Surgeon: Nadara Mustard, MD;  Location: [facility] OR;  Service: Orthopedics;  Laterality: Left;       No family history on file.  Social History   Tobacco Use  . Smoking status: Current Every Day Smoker    Packs/day: 1.00  . Smokeless tobacco: Never Used  Substance Use Topics  . Alcohol use: Yes    Comment: 5 40 oz/day  . Drug use: Not Currently    Home Medications Prior to Admission medications   Medication Sig Start Date End Date Taking? Authorizing Provider  folic acid (FOLVITE) 1 MG tablet Take 1 mg by mouth daily. Patient not taking: Reported on 08/28/2020    [provider]  gabapentin (NEURONTIN) 100 MG capsule Take 1 capsule (100 mg total) by mouth 2 (two) times daily. Patient not taking: Reported on 08/28/2020 01/04/20   Rhetta Mura, MD  hydrALAZINE (APRESOLINE) 50 MG tablet Take 1 tablet (50 mg total) by mouth every 8 (eight) hours. Patient not taking: Reported on 08/28/2020 12/31/19 01/30/20  Darlin Drop, DO  metoprolol tartrate (LOPRESSOR) 25 MG tablet Take 75 mg by mouth 2 (two)  times daily. Patient not taking: Reported on 08/28/2020 01/04/20   [provider]  Metoprolol Tartrate 75 MG TABS Take 75 mg by mouth 2 (two) times daily. Patient not taking: Reported on 01/07/2020 01/04/20 02/03/20  Rhetta Mura, MD  Multiple Vitamin (MULTIVITAMIN WITH MINERALS) TABS tablet Take 1 tablet by mouth daily. Patient not taking: Reported on 08/28/2020 12/13/19   Rolly Salter, MD  nicotine (NICODERM CQ - DOSED IN MG/24 HOURS) 14 mg/24hr patch Place 1 patch (14 mg total) onto the skin daily. Patient not taking: Reported on 08/28/2020 12/13/19   Rolly Salter, MD  thiamine 100 MG  tablet Take 1 tablet (100 mg total) by mouth daily. Patient not taking: Reported on 08/28/2020 12/13/19   Rolly Salter, MD    Allergies    Patient has no known allergies.  Review of Systems   Review of Systems  All other systems reviewed and are negative.   Physical Exam Updated Vital Signs BP (!) 145/106 (BP Location: Left Arm)   Pulse (!) 102   Temp 98.3 F (36.8 C) (Oral)   SpO2 100%   Physical Exam Vitals and nursing note reviewed.  Constitutional:      General: He is not in acute distress.    Appearance: He is well-developed.  HENT:     Head: Normocephalic and atraumatic.  Eyes:     Conjunctiva/sclera: Conjunctivae normal.     Pupils: Pupils are equal, round, and reactive to light.  Cardiovascular:     Rate and Rhythm: Normal rate and regular rhythm.     Heart sounds: Normal heart sounds.  Pulmonary:     Effort: Pulmonary effort is normal. No respiratory distress.     Breath sounds: Normal breath sounds.  Abdominal:     General: There is no distension.     Palpations: Abdomen is soft.     Tenderness: There is no abdominal tenderness.  Musculoskeletal:        General: No deformity. Normal range of motion.     Cervical back: Normal range of motion and neck supple.     Comments: Well healed incision to posterior aspect left calf   Skin:    General: Skin is warm and dry.  Neurological:     Mental Status: He is alert and oriented to person, place, and time.     ED Results / Procedures / Treatments   Labs (all labs ordered are listed, but only abnormal results are displayed) Labs Reviewed - No data to display  EKG None  Radiology DG Chest 2 View  Result Date: 08/27/2020 CLINICAL DATA:  Sudden onset chest pain. EXAM: CHEST - 2 VIEW COMPARISON:  Chest x-ray dated December 16, 2019. FINDINGS: The heart size and mediastinal contours are within normal limits. Normal pulmonary vascularity. No focal consolidation, pleural effusion, or pneumothorax. No acute osseous  abnormality. Old bilateral rib fractures again noted. IMPRESSION: 1. No acute cardiopulmonary disease. Electronically Signed   By: Obie Dredge M.D.   On: 08/27/2020 17:54    Procedures Procedures   Medications Ordered in ED Medications - No data to display  ED Course  I have reviewed the triage vital signs and the nursing notes.  Pertinent labs & imaging results that were available during my care of the patient were reviewed by me and considered in my medical decision making (see chart for details).    MDM Rules/Calculators/A&P  MDM  Screen complete  Robert Frank was evaluated in Emergency Department on 08/28/2020 for the symptoms described in the history of present illness. He was evaluated in the context of the global COVID-19 pandemic, which necessitated consideration that the patient might be at risk for infection with the SARS-CoV-2 virus that causes COVID-19. Institutional protocols and algorithms that pertain to the evaluation of patients at risk for COVID-19 are in a state of rapid change based on information released by regulatory bodies including the CDC and federal and state organizations. These policies and algorithms were followed during the patient's care in the ED.  Patient without acute medical complaint.  Patient declined social work eval.  Patient's understands need for close follow-up.  Patient's wound to the left posterior lower leg is well-healed.  Final Clinical Impression(s) / ED Diagnoses Final diagnoses:  Visit for wound check    Rx / DC Orders ED Discharge Orders    None       Wynetta Fines, MD 08/28/20 1323

## 2020-08-28 NOTE — Discharge Instructions (Addendum)
Please return for any problem.  °

## 2020-08-28 NOTE — Discharge Instructions (Signed)
Your evaluation in the emergency department was reassuring and did not reveal a concerning cause of your symptoms.  We recommend follow-up with a primary care doctor as well as a cardiologist.  Return to the ED if symptoms persist or worsen.

## 2020-08-28 NOTE — ED Notes (Signed)
Per PA Humes, RN to stop Thiamine infusion and bolus 500 cc NS.

## 2020-08-28 NOTE — ED Triage Notes (Signed)
Sutured removed on leg; concern for infection

## 2020-08-28 NOTE — ED Notes (Signed)
Patient verbalizes understanding of discharge instructions. Opportunity for questioning and answers were provided. Armband removed by staff, pt discharged from ED via wheelchair to lobby to go home.

## 2020-08-28 NOTE — ED Provider Notes (Signed)
[facility] EMERGENCY DEPARTMENT Provider Note   CSN: [id number] Arrival date & time: 08/27/20  1545     History Chief Complaint  Patient presents with  . Chest Pain    Robert Frank is a 55 y.o. male.  55 year old male with a history of alcoholism, COPD, hypertension, CVA presents to the ED for complaint of chest pain.  He states that he has chest pain frequently, but felt it was worse today.  He is presently chest pain-free.  Is unable to articulate if anything makes the pain better or worse.  Reports it was more sudden when started this afternoon.  Was given aspirin by EMS prior to arrival.  Has had some associated paresthesias in his arms when the pain is present.  Did drink a 40 ounce beer today.  Does not feel the pain is worse after drinking alcohol; actually thinks this helps his discomfort.  Denies illicit drug use, specifically cocaine use.  No fevers, vomiting, leg swelling, syncope, diaphoresis.  The history is provided by the patient. No language interpreter was used.  Chest Pain      Past Medical History:  Diagnosis Date  . Alcoholism (HCC)   . Blind left eye   . COPD (chronic obstructive pulmonary disease) (HCC)   . 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 Hypertension     Patient Active Problem List   Diagnosis Date Noted  . Abscess of left lower leg   . Anemia of chronic disease 12/18/2019  . Essential hypertension 12/17/2019  . Bilateral cellulitis of lower leg 12/16/2019  . Bilateral lower leg cellulitis 12/16/2019  . Hypokalemia 12/05/2019  . Hypomagnesemia 12/05/2019  . Generalized weakness 12/04/2019  . Transaminitis 12/04/2019  . Decubitus skin ulcer 12/04/2019  . Hyponatremia 12/04/2019  . Alcohol withdrawal (HCC) 08/17/2019  . Encephalopathy acute 08/17/2019  . Alcoholism (HCC)   . Cellulitis   . COPD exacerbation (HCC) 10/07/2018  . COPD (chronic obstructive pulmonary  disease) (HCC) 10/06/2018  . COPD with acute exacerbation (HCC) 10/06/2018    Past Surgical History:  Procedure Laterality Date  . ANTERIOR CRUCIATE LIGAMENT REPAIR    . I & D EXTREMITY Left 12/21/2019   Procedure: IRRIGATION AND DEBRIDEMENT LEFT CALF ABSCESS;  Surgeon: Cammy Copa, MD;  Location: [facility] OR;  Service: Orthopedics;  Laterality: Left;  . I & D EXTREMITY Left 12/28/2019   Procedure: IRRIGATION AND DEBRIDEMENT LEFT CALF ABSCESS;  Surgeon: Nadara Mustard, MD;  Location: [facility] OR;  Service: Orthopedics;  Laterality: Left;       No family history on file.  Social History   Tobacco Use  . Smoking status: Current Every Day Smoker    Packs/day: 1.00  . Smokeless tobacco: Never Used  Substance Use Topics  . Alcohol use: Yes    Comment: 5 40 oz/day  . Drug use: Not Currently    Home Medications Prior to Admission medications   Medication Sig Start Date End Date Taking? Authorizing Provider  folic acid (FOLVITE) 1 MG tablet Take 1 mg by mouth daily. Patient not taking: Reported on 08/28/2020    [provider]  gabapentin (NEURONTIN) 100 MG capsule Take 1 capsule (100 mg total) by mouth 2 (two) times daily. Patient not taking: Reported on 08/28/2020 01/04/20   Rhetta Mura, MD  hydrALAZINE (APRESOLINE) 50 MG tablet Take 1 tablet (50 mg total) by mouth every 8 (eight) hours. Patient not taking: Reported on  08/28/2020 12/31/19 01/30/20  Darlin Drop, DO  metoprolol tartrate (LOPRESSOR) 25 MG tablet Take 75 mg by mouth 2 (two) times daily. Patient not taking: Reported on 08/28/2020 01/04/20   [provider]  Metoprolol Tartrate 75 MG TABS Take 75 mg by mouth 2 (two) times daily. Patient not taking: Reported on 01/07/2020 01/04/20 02/03/20  Rhetta Mura, MD  Multiple Vitamin (MULTIVITAMIN WITH MINERALS) TABS tablet Take 1 tablet by mouth daily. Patient not taking: Reported on 08/28/2020 12/13/19   Rolly Salter, MD  nicotine (NICODERM CQ - DOSED IN  MG/24 HOURS) 14 mg/24hr patch Place 1 patch (14 mg total) onto the skin daily. Patient not taking: Reported on 08/28/2020 12/13/19   Rolly Salter, MD  thiamine 100 MG tablet Take 1 tablet (100 mg total) by mouth daily. Patient not taking: Reported on 08/28/2020 12/13/19   Rolly Salter, MD    Allergies    Patient has no known allergies.  Review of Systems   Review of Systems  Cardiovascular: Positive for chest pain.  Ten systems reviewed and are negative for acute change, except as noted in the HPI.    Physical Exam Updated Vital Signs BP (!) 136/99   Pulse (!) 113   Temp 98.6 F (37 C)   Resp 14   SpO2 100%   Physical Exam Vitals and nursing note reviewed.  Constitutional:      General: He is not in acute distress.    Appearance: He is well-developed. He is not diaphoretic.     Comments: Nontoxic appearing and in NAD. Disheveled and foul smelling with dirty clothes.  HENT:     Head: Normocephalic and atraumatic.  Eyes:     General: No scleral icterus.    Conjunctiva/sclera: Conjunctivae normal.     Comments: Chronic left pupillary and corneal defect  Cardiovascular:     Rate and Rhythm: Regular rhythm. Tachycardia present.     Pulses: Normal pulses.  Pulmonary:     Effort: Pulmonary effort is normal. No respiratory distress.     Breath sounds: No stridor. No wheezing.     Comments: Respirations even and unlabored Musculoskeletal:        General: Normal range of motion.     Cervical back: Normal range of motion.     Comments: No pitting edema in BLE. Stitches in the L calf; patient reports these are 3+ months old.  Skin:    General: Skin is warm and dry.     Coloration: Skin is not pale.     Findings: No erythema or rash.  Neurological:     Mental Status: He is alert and oriented to person, place, and time.     Coordination: Coordination normal.  Psychiatric:        Behavior: Behavior normal.     ED Results / Procedures / Treatments   Labs (all labs ordered  are listed, but only abnormal results are displayed) Labs Reviewed  BASIC METABOLIC PANEL - Abnormal; Notable for the following components:      Result Value   Sodium 132 (*)    Chloride 96 (*)    BUN 5 (*)    All other components within normal limits  CBC - Abnormal; Notable for the following components:   RBC 4.20 (*)    MCV 101.7 (*)    All other components within normal limits  TROPONIN I (HIGH SENSITIVITY)  TROPONIN I (HIGH SENSITIVITY)    EKG EKG Interpretation  Date/Time:  Monday August 27 2020 15:55:26 EDT Ventricular Rate:  99 PR Interval:  140 QRS Duration: 74 QT Interval:  354 QTC Calculation: 454 R Axis:   -9 Text Interpretation: Normal sinus rhythm Normal ECG When compared with ECG of 12/16/2019, No significant change was found Confirmed by Dione Booze ([id number]) on 08/28/2020 1:55:30 AM   Radiology DG Chest 2 View  Result Date: 08/27/2020 CLINICAL DATA:  Sudden onset chest pain. EXAM: CHEST - 2 VIEW COMPARISON:  Chest x-ray dated December 16, 2019. FINDINGS: The heart size and mediastinal contours are within normal limits. Normal pulmonary vascularity. No focal consolidation, pleural effusion, or pneumothorax. No acute osseous abnormality. Old bilateral rib fractures again noted. IMPRESSION: 1. No acute cardiopulmonary disease. Electronically Signed   By: Obie Dredge M.D.   On: 08/27/2020 17:54    Procedures Procedures   Medications Ordered in ED Medications  sodium chloride 0.9 % bolus 500 mL (has no administration in time range)  LORazepam (ATIVAN) injection 1 mg (1 mg Intravenous Given 08/28/20 0249)  sodium chloride 0.9 % 1,000 mL with thiamine 100 mg, folic acid 1 mg, multivitamins adult 10 mL infusion ( Intravenous New Bag/Given 08/28/20 0447)  LORazepam (ATIVAN) injection 1 mg (1 mg Intravenous Given 08/28/20 0443)    ED Course  I have reviewed the triage vital signs and the nursing notes.  Pertinent labs & imaging results that were available during my care  of the patient were reviewed by me and considered in my medical decision making (see chart for details).    MDM Rules/Calculators/A&P                          Patient presents to the emergency department for evaluation of chest pain.  Hx of recurrent chest pain; states pain today was subjectively worse.  Chest pain free during my assessment.  EKG is nonischemic and troponin negative x 2.  Chest x-ray without evidence of mediastinal widening to suggest dissection.  No pneumothorax, pneumonia, pleural effusion.    Patient homeless. Given IVF and ativan in the ED given hx of ETOH abuse to prevent acute withdrawal. Most recent CIWA is 2. Given reassuring labs, chronicity of symptoms, stability over ED course - will discharge with instruction to f/u with PCP. Cardiology referral provided as well. Return precautions provided. Patient discharged in stable condition with no unaddressed concerns.   Final Clinical Impression(s) / ED Diagnoses Final diagnoses:  Nonspecific chest pain    Rx / DC Orders ED Discharge Orders    None       Antony Madura, PA-C 08/28/20 Ulis Rias    Dione Booze, MD 08/28/20 [phone]

## 2020-09-30 ENCOUNTER — Emergency Department (HOSPITAL_COMMUNITY): Payer: Self-pay

## 2020-09-30 ENCOUNTER — Other Ambulatory Visit: Payer: Self-pay

## 2020-09-30 ENCOUNTER — Emergency Department (HOSPITAL_COMMUNITY)
Admission: EM | Admit: 2020-09-30 | Discharge: 2020-09-30 | Disposition: A | Discharge status: Home / Self Care | Payer: Self-pay | Attending: Emergency Medicine | Admitting: Emergency Medicine

## 2020-09-30 ENCOUNTER — Encounter (HOSPITAL_COMMUNITY): Payer: Self-pay

## 2020-09-30 ENCOUNTER — Other Ambulatory Visit (HOSPITAL_COMMUNITY): Payer: Self-pay | Admitting: Radiology

## 2020-09-30 DIAGNOSIS — M25552 Pain in left hip: Secondary | ICD-10-CM | POA: Insufficient documentation

## 2020-09-30 DIAGNOSIS — F17200 Nicotine dependence, unspecified, uncomplicated: Secondary | ICD-10-CM | POA: Insufficient documentation

## 2020-09-30 DIAGNOSIS — I10 Essential (primary) hypertension: Secondary | ICD-10-CM | POA: Insufficient documentation

## 2020-09-30 DIAGNOSIS — M545 Low back pain, unspecified: Secondary | ICD-10-CM | POA: Insufficient documentation

## 2020-09-30 DIAGNOSIS — W19XXXA Unspecified fall, initial encounter: Secondary | ICD-10-CM | POA: Insufficient documentation

## 2020-09-30 DIAGNOSIS — R519 Headache, unspecified: Secondary | ICD-10-CM | POA: Insufficient documentation

## 2020-09-30 DIAGNOSIS — S0081XA Abrasion of other part of head, initial encounter: Secondary | ICD-10-CM | POA: Insufficient documentation

## 2020-09-30 DIAGNOSIS — Y92480 Sidewalk as the place of occurrence of the external cause: Secondary | ICD-10-CM | POA: Insufficient documentation

## 2020-09-30 DIAGNOSIS — T148XXA Other injury of unspecified body region, initial encounter: Secondary | ICD-10-CM

## 2020-09-30 DIAGNOSIS — J441 Chronic obstructive pulmonary disease with (acute) exacerbation: Secondary | ICD-10-CM | POA: Insufficient documentation

## 2020-09-30 DIAGNOSIS — M25561 Pain in right knee: Secondary | ICD-10-CM | POA: Insufficient documentation

## 2020-09-30 LAB — CK: Total CK: 59 U/L (ref 49–397)

## 2020-09-30 LAB — CBC WITH DIFFERENTIAL/PLATELET
Abs Immature Granulocytes: 0.01 10*3/uL (ref 0.00–0.07)
Basophils Absolute: 0.1 10*3/uL (ref 0.0–0.1)
Basophils Relative: 1 %
Eosinophils Absolute: 0.3 10*3/uL (ref 0.0–0.5)
Eosinophils Relative: 6 %
HCT: 46.8 % (ref 39.0–52.0)
Hemoglobin: 15.4 g/dL (ref 13.0–17.0)
Immature Granulocytes: 0 %
Lymphocytes Relative: 29 %
Lymphs Abs: 1.5 10*3/uL (ref 0.7–4.0)
MCH: 33.6 pg (ref 26.0–34.0)
MCHC: 32.9 g/dL (ref 30.0–36.0)
MCV: 102 fL — ABNORMAL HIGH (ref 80.0–100.0)
Monocytes Absolute: 0.5 10*3/uL (ref 0.1–1.0)
Monocytes Relative: 10 %
Neutro Abs: 2.8 10*3/uL (ref 1.7–7.7)
Neutrophils Relative %: 54 %
Platelets: 211 10*3/uL (ref 150–400)
RBC: 4.59 MIL/uL (ref 4.22–5.81)
RDW: 13.2 % (ref 11.5–15.5)
WBC: 5.1 10*3/uL (ref 4.0–10.5)
nRBC: 0 % (ref 0.0–0.2)

## 2020-09-30 LAB — BASIC METABOLIC PANEL
Anion gap: 8 (ref 5–15)
BUN: 5 mg/dL — ABNORMAL LOW (ref 6–20)
CO2: 26 mmol/L (ref 22–32)
Calcium: 8.7 mg/dL — ABNORMAL LOW (ref 8.9–10.3)
Chloride: 100 mmol/L (ref 98–111)
Creatinine, Ser: 0.68 mg/dL (ref 0.61–1.24)
GFR, Estimated: >60 mL/min (ref >60)
Glucose, Bld: 93 mg/dL (ref 70–99)
Potassium: 4.2 mmol/L (ref 3.5–5.1)
Sodium: 134 mmol/L — ABNORMAL LOW (ref 135–145)

## 2020-09-30 MED ORDER — SODIUM CHLORIDE 0.9 % IV BOLUS
1000.0000 mL | Freq: Once | INTRAVENOUS | Status: AC
  Administered 2020-09-30: 1000 mL via INTRAVENOUS

## 2020-09-30 MED ORDER — METOPROLOL TARTRATE 25 MG PO TABS
25.0000 mg | ORAL_TABLET | Freq: Once | ORAL | Status: AC
  Administered 2020-09-30: 25 mg via ORAL
  Filled 2020-09-30: qty 1

## 2020-09-30 MED ORDER — ACETAMINOPHEN 325 MG PO TABS: 650.0000 mg | ORAL_TABLET | Freq: Once | ORAL | Status: DC

## 2020-09-30 MED ORDER — BACITRACIN ZINC 500 UNIT/GM EX OINT: TOPICAL_OINTMENT | Freq: Once | CUTANEOUS | Status: DC

## 2020-09-30 NOTE — ED Notes (Signed)
[facility] CALLED  °

## 2020-09-30 NOTE — ED Triage Notes (Addendum)
[facility] from home. Unwitnessed fall. ETOH onboard. C/O headache and LBP. Pt was outside when medic arrived. Fall happened on sidewalk in front of house. A&O X4.

## 2020-09-30 NOTE — ED Notes (Signed)
Attempted to ambulate to pt. Pt states that he uses a wheelchair for mobility.

## 2020-09-30 NOTE — ED Provider Notes (Signed)
[facility] EMERGENCY DEPARTMENT Provider Note   CSN: [id number] Arrival date & time: 09/30/20  0962     History Chief Complaint  Patient presents with  . Fall    Robert Frank is a 55 y.o. male with past medical history significant for alcoholism, blindness in the left eye, COPD, CVA with residual right-sided numbness, hypertension.  Not anticoagulated.  Tetanus is up-to-date.  HPI Patient presents to emergency department today via EMS with chief complaint of fall.  Patient states his neighbor called EMS.  He had a fall on the sidewalk in front of his house.  He states he was on the ground for at least 4 hours.  Patient is unsure if he had any loss of consciousness.  He states he had been drinking and lost his balance causing the fall.  When asked how much he drinks he answers "a lot."  His last drink was just prior to the fall.  Denies any prodrome of dizziness, chest pain or shortness of breath.  No medications for symptoms prior to arrival.  He is reporting pain in his low back, left hip, and right knee. He rates pain 6/10 in severity. Pain is worse with movement.     Past Medical History:  Diagnosis Date  . Alcoholism (HCC)   . Blind left eye   . COPD (chronic obstructive pulmonary disease) (HCC)   . 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 Hypertension     Patient Active Problem List   Diagnosis Date Noted  . Abscess of left lower leg   . Anemia of chronic disease 12/18/2019  . Essential hypertension 12/17/2019  . Bilateral cellulitis of lower leg 12/16/2019  . Bilateral lower leg cellulitis 12/16/2019  . Hypokalemia 12/05/2019  . Hypomagnesemia 12/05/2019  . Generalized weakness 12/04/2019  . Transaminitis 12/04/2019  . Decubitus skin ulcer 12/04/2019  . Hyponatremia 12/04/2019  . Alcohol withdrawal (HCC) 08/17/2019  . Encephalopathy acute 08/17/2019  . Alcoholism (HCC)   . Cellulitis   . COPD  exacerbation (HCC) 10/07/2018  . COPD (chronic obstructive pulmonary disease) (HCC) 10/06/2018  . COPD with acute exacerbation (HCC) 10/06/2018    Past Surgical History:  Procedure Laterality Date  . ANTERIOR CRUCIATE LIGAMENT REPAIR    . I & D EXTREMITY Left 12/21/2019   Procedure: IRRIGATION AND DEBRIDEMENT LEFT CALF ABSCESS;  Surgeon: Cammy Copa, MD;  Location: [facility] OR;  Service: Orthopedics;  Laterality: Left;  . I & D EXTREMITY Left 12/28/2019   Procedure: IRRIGATION AND DEBRIDEMENT LEFT CALF ABSCESS;  Surgeon: Nadara Mustard, MD;  Location: [facility] OR;  Service: Orthopedics;  Laterality: Left;       No family history on file.  Social History   Tobacco Use  . Smoking status: Current Every Day Smoker    Packs/day: 1.00  . Smokeless tobacco: Never Used  Substance Use Topics  . Alcohol use: Yes    Comment: 5 40 oz/day  . Drug use: Not Currently    Home Medications Prior to Admission medications   Medication Sig Start Date End Date Taking? Authorizing Provider  Metoprolol Tartrate 75 MG TABS Take 75 mg by mouth 2 (two) times daily. Patient not taking: No sig reported 01/04/20 02/03/20  Rhetta Mura, MD  Multiple Vitamin (MULTIVITAMIN WITH MINERALS) TABS tablet Take 1 tablet by mouth daily. Patient not taking: No sig reported 12/13/19   Rolly Salter, MD  thiamine 100 MG tablet  Take 1 tablet (100 mg total) by mouth daily. Patient not taking: No sig reported 12/13/19   Rolly Salter, MD    Allergies    Patient has no known allergies.  Review of Systems   Review of Systems All other systems are reviewed and are negative for acute change except as noted in the HPI.  Physical Exam Updated Vital Signs BP (!) 133/108 (BP Location: Right Arm)   Pulse 72   Temp (!) 97.4 F (36.3 C) (Axillary)   Resp 20   Ht 5\' 7"  (1.702 m)   Wt 81.6 kg   SpO2 100%   BMI 28.19 kg/m   Physical Exam Vitals and nursing note reviewed.  Constitutional:      General: He is not  in acute distress.    Appearance: He is not ill-appearing.  HENT:     Head: Normocephalic. No raccoon eyes or Battle's sign.     Jaw: There is normal jaw occlusion.     Comments: Abrasion to left forehead.  No active bleeding.  No foreign body seen.  No tenderness to palpation of skull. No deformities or crepitus noted. No lacerations.     Right Ear: Tympanic membrane and external ear normal. No hemotympanum.     Left Ear: Tympanic membrane and external ear normal. No hemotympanum.     Nose: Nose normal.     Mouth/Throat:     Mouth: Mucous membranes are moist. No injury.     Pharynx: Oropharynx is clear.  Eyes:     General: No scleral icterus.       Right eye: No discharge.        Left eye: No discharge.     Extraocular Movements: Extraocular movements intact.     Conjunctiva/sclera: Conjunctivae normal.     Pupils: Pupils are equal, round, and reactive to light.  Neck:     Vascular: No JVD.     Comments: Full ROM intact without spinous process TTP. No bony stepoffs or deformities, no paraspinous muscle TTP or muscle spasms. No rigidity or meningeal signs. No bruising, erythema, or swelling.  Cardiovascular:     Rate and Rhythm: Normal rate and regular rhythm.     Pulses: Normal pulses.          Radial pulses are 2+ on the right side and 2+ on the left side.     Heart sounds: Normal heart sounds.  Pulmonary:     Effort: Pulmonary effort is normal.     Breath sounds: Normal breath sounds.     Comments: Lungs clear to auscultation in all fields. Symmetric chest rise. No wheezing, rales, or rhonchi. Abdominal:     General: Bowel sounds are normal. There is no distension.     Palpations: Abdomen is soft. There is no mass.     Tenderness: There is no abdominal tenderness. There is no guarding or rebound.     Hernia: No hernia is present.     Comments: Abdomen is soft, non-distended, and non-tender in all quadrants. No rigidity, no guarding. No peritoneal signs.  Musculoskeletal:         General: Normal range of motion.     Cervical back: Normal and normal range of motion.     Thoracic back: Normal.       Back:     Comments: Tender to palpation as depicted in image above.  No overlying skin changes or open wounds.  No step-off or deformity.  Full range of motion of cervical,  thoracic and lumbar spine.  Pelvis is stable.  Full passive range of motion of bilateral hips, knees and ankles.  No leg length discrepancy.  Surgical scar on right knee.  Skin:    General: Skin is warm and dry.     Capillary Refill: Capillary refill takes less than 2 seconds.  Neurological:     Mental Status: He is oriented to person, place, and time.     GCS: GCS eye subscore is 4. GCS verbal subscore is 5. GCS motor subscore is 6.     Comments: Fluent speech, no facial droop.  Psychiatric:        Behavior: Behavior normal.     ED Results / Procedures / Treatments   Labs (all labs ordered are listed, but only abnormal results are displayed) Labs Reviewed  CBC WITH DIFFERENTIAL/PLATELET - Abnormal; Notable for the following components:      Result Value   MCV 102.0 (*)    All other components within normal limits  BASIC METABOLIC PANEL - Abnormal; Notable for the following components:   Sodium 134 (*)    BUN 5 (*)    Calcium 8.7 (*)    All other components within normal limits  CK    EKG None  Radiology DG Lumbar Spine Complete  Result Date: 09/30/2020 CLINICAL DATA:  Unwitnessed fall. Low back pain. Initial encounter. EXAM: LUMBAR SPINE - COMPLETE 4+ VIEW COMPARISON:  None. FINDINGS: There is no evidence of lumbar spine fracture. Alignment is normal. Intervertebral disc spaces are maintained. No evidence of facet arthropathy or other focal bone lesions. Aortic atherosclerotic calcification noted. IMPRESSION: Negative.  No radiographic abnormality of lumbar spine. Electronically Signed   By: Danae Orleans M.D.   On: 09/30/2020 08:18   CT Head Wo Contrast  Result Date:  09/30/2020 CLINICAL DATA:  Neck trauma.  Fall off rocking chair. EXAM: CT HEAD WITHOUT CONTRAST CT CERVICAL SPINE WITHOUT CONTRAST TECHNIQUE: Multidetector CT imaging of the head and cervical spine was performed following the standard protocol without intravenous contrast. Multiplanar CT image reconstructions of the cervical spine were also generated. COMPARISON:  11/22/2017 FINDINGS: CT HEAD FINDINGS Brain: No evidence of acute infarction, hemorrhage, hydrocephalus, extra-axial collection or mass lesion/mass effect. Marked brain atrophy, especially for age. Asymmetric atrophy of the left cerebellum. Chronic small vessel ischemia in the cerebral white matter with chronic lacunes at the bilateral deep gray nuclei. Vascular: No hyperdense vessel or unexpected calcification. Skull: No acute fracture Sinuses/Orbits: No visible injury Other: Motion artifact. CT CERVICAL SPINE FINDINGS Alignment: Normal Skull base and vertebrae: No acute fracture Soft tissues and spinal canal: No prevertebral fluid or swelling. No visible canal hematoma. Disc levels:  Ordinary cervical spine degeneration. Upper chest: Small right pleural effusion which is layering where visualized. IMPRESSION: 1. No evidence of acute intracranial or cervical spine injury. 2. Advanced brain atrophy and chronic small vessel disease, especially for age, with progression from 2019. 3. Partial coverage of a small right pleural effusion. Electronically Signed   By: Marnee Spring M.D.   On: 09/30/2020 08:33   CT Cervical Spine Wo Contrast  Result Date: 09/30/2020 CLINICAL DATA:  Neck trauma.  Fall off rocking chair. EXAM: CT HEAD WITHOUT CONTRAST CT CERVICAL SPINE WITHOUT CONTRAST TECHNIQUE: Multidetector CT imaging of the head and cervical spine was performed following the standard protocol without intravenous contrast. Multiplanar CT image reconstructions of the cervical spine were also generated. COMPARISON:  11/22/2017 FINDINGS: CT HEAD FINDINGS Brain:  No evidence of acute infarction, hemorrhage,  hydrocephalus, extra-axial collection or mass lesion/mass effect. Marked brain atrophy, especially for age. Asymmetric atrophy of the left cerebellum. Chronic small vessel ischemia in the cerebral white matter with chronic lacunes at the bilateral deep gray nuclei. Vascular: No hyperdense vessel or unexpected calcification. Skull: No acute fracture Sinuses/Orbits: No visible injury Other: Motion artifact. CT CERVICAL SPINE FINDINGS Alignment: Normal Skull base and vertebrae: No acute fracture Soft tissues and spinal canal: No prevertebral fluid or swelling. No visible canal hematoma. Disc levels:  Ordinary cervical spine degeneration. Upper chest: Small right pleural effusion which is layering where visualized. IMPRESSION: 1. No evidence of acute intracranial or cervical spine injury. 2. Advanced brain atrophy and chronic small vessel disease, especially for age, with progression from 2019. 3. Partial coverage of a small right pleural effusion. Electronically Signed   By: Marnee SpringJonathon  Watts M.D.   On: 09/30/2020 08:33   DG Knee Complete 4 Views Right  Result Date: 09/30/2020 CLINICAL DATA:  Unwitnessed fall. Right knee pain. Initial encounter. EXAM: RIGHT KNEE - COMPLETE 4+ VIEW COMPARISON:  12/15/2019 FINDINGS: No evidence of fracture, dislocation, or joint effusion. 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 Generalized osteopenia also noted. IMPRESSION: No acute findings. Osteoarthritis. Electronically Signed   By: Danae OrleansJohn A Stahl M.D.   On: 09/30/2020 08:30   DG Hip Unilat W or Wo Pelvis 2-3 Views Left  Result Date: 09/30/2020 CLINICAL DATA:  Unwitnessed fall. Left hip pain. Initial encounter. EXAM: DG HIP (WITH OR WITHOUT PELVIS) 2-3V LEFT COMPARISON:  None. FINDINGS: There is no evidence of hip fracture or dislocation. There is no evidence of arthropathy or other focal bone abnormality.  IMPRESSION: Negative. Electronically Signed   By: Danae OrleansJohn A Stahl M.D.   On: 09/30/2020 08:19    Procedures Procedures   Medications Ordered in ED Medications  acetaminophen (TYLENOL) tablet 650 mg (650 mg Oral Patient Refused/Not Given 09/30/20 0803)  bacitracin ointment (has no administration in time range)  sodium chloride 0.9 % bolus 1,000 mL (1,000 mLs Intravenous New Bag/Given 09/30/20 0802)  metoprolol tartrate (LOPRESSOR) tablet 25 mg (25 mg Oral Given 09/30/20 0900)    ED Course  I have reviewed the triage vital signs and the nursing notes.  Pertinent labs & imaging results that were available during my care of the patient were reviewed by me and considered in my medical decision making (see chart for details).    MDM Rules/Calculators/A&P                          History provided by patient with additional history obtained from chart review.    Patient presenting after fall while intoxicated.  On ED arrival he is nontoxic-appearing.  He is afebrile.  He is hypertensive at 162/120.  Patient has history of hypertension and is noncompliant with medications.  He has an abrasion on his right forehead.  Tetanus is up-to-date.  Wound cleaned and bacitracin ointment applied.  He has tenderness to palpation of lumbar spine, left hip and right knee.  No obvious deformities.  Given patient's intoxication head CT and cervical spine obtained.  No acute traumatic findings.  X-rays of left hip, knee and lumbar spine viewed by me.  No acute findings there either.  No fractures or dislocations seen.  Agree with radiologist impressions.  Also checked basic labs and CK as patient reported possible downtime of 4 hours on the ground, no one able to corroborate the story.  CBC  and BMP are overall unremarkable.  CK within normal range.  Patient continues to be hypertensive here.  Suspect this is related to noncompliance.  He has no signs of endorgan damage on labs.  Patient given home dose of Lopressor here.  Patient also a liter of fluids and Tylenol.  Attempted to ambulate patient with nursing staff.  Patient is able to stand and bear weight on bilateral lower extremities without assistance.  He admits to always using his wheelchair at home now, he never uses his walker.  Chart review shows patient was admitted to the hospital in July 2021.  On his discharge summary it was noted that he ambulated in the room with a walker at time of discharge and has a wheelchair at home.  Patient is able to stand however does not want to ambulate here in the ED.  He has full passive range of motion of bilateral lower extremities.  Low suspicion for occult fracture. He is clinically sober.  Patient ate a sandwich here.  He is stable for discharge home.  Return precautions discussed.  Declines need for any medication refills.  Recommended he have follow-up with PCP for blood pressure recheck and if pain persists. Discussed with ED attending Dr. Criss Alvine who agrees with plan for patient discharge home and follow-up with PCP for reassessment.  Patient given information for community clinic.  Portions of this note were generated with Scientist, clinical (histocompatibility and immunogenetics). Dictation errors may occur despite best attempts at proofreading.  Final Clinical Impression(s) / ED Diagnoses Final diagnoses:  Fall, initial encounter  Abrasion    Rx / DC Orders ED Discharge Orders    None       Kandice Hams 09/30/20 1139    Pricilla Loveless, MD 10/02/20 [phone]

## 2020-09-30 NOTE — ED Notes (Signed)
Patient transported to X-ray 

## 2020-11-22 ENCOUNTER — Emergency Department (HOSPITAL_COMMUNITY): Payer: Self-pay

## 2020-11-22 ENCOUNTER — Encounter (HOSPITAL_COMMUNITY): Payer: Self-pay | Admitting: Emergency Medicine

## 2020-11-22 ENCOUNTER — Inpatient Hospital Stay (HOSPITAL_COMMUNITY)
Admission: EM | Admit: 2020-11-22 | Discharge: 2020-12-18 | DRG: 871 | Disposition: A | Discharge status: Skilled Nursing Facility | Payer: Self-pay | Attending: Family Medicine | Admitting: Family Medicine

## 2020-11-22 ENCOUNTER — Other Ambulatory Visit: Payer: Self-pay

## 2020-11-22 DIAGNOSIS — R160 Hepatomegaly, not elsewhere classified: Secondary | ICD-10-CM

## 2020-11-22 DIAGNOSIS — R2689 Other abnormalities of gait and mobility: Secondary | ICD-10-CM | POA: Diagnosis present

## 2020-11-22 DIAGNOSIS — Z993 Dependence on wheelchair: Secondary | ICD-10-CM

## 2020-11-22 DIAGNOSIS — R6889 Other general symptoms and signs: Secondary | ICD-10-CM | POA: Diagnosis present

## 2020-11-22 DIAGNOSIS — R748 Abnormal levels of other serum enzymes: Secondary | ICD-10-CM | POA: Diagnosis present

## 2020-11-22 DIAGNOSIS — Z20822 Contact with and (suspected) exposure to covid-19: Secondary | ICD-10-CM | POA: Diagnosis present

## 2020-11-22 DIAGNOSIS — M6281 Muscle weakness (generalized): Secondary | ICD-10-CM | POA: Diagnosis present

## 2020-11-22 DIAGNOSIS — K5900 Constipation, unspecified: Secondary | ICD-10-CM | POA: Diagnosis present

## 2020-11-22 DIAGNOSIS — E8809 Other disorders of plasma-protein metabolism, not elsewhere classified: Secondary | ICD-10-CM | POA: Diagnosis present

## 2020-11-22 DIAGNOSIS — J449 Chronic obstructive pulmonary disease, unspecified: Secondary | ICD-10-CM | POA: Diagnosis present

## 2020-11-22 DIAGNOSIS — Z751 Person awaiting admission to adequate facility elsewhere: Secondary | ICD-10-CM

## 2020-11-22 DIAGNOSIS — E876 Hypokalemia: Secondary | ICD-10-CM | POA: Diagnosis present

## 2020-11-22 DIAGNOSIS — Z9189 Other specified personal risk factors, not elsewhere classified: Secondary | ICD-10-CM

## 2020-11-22 DIAGNOSIS — E43 Unspecified severe protein-calorie malnutrition: Secondary | ICD-10-CM | POA: Diagnosis present

## 2020-11-22 DIAGNOSIS — M2011 Hallux valgus (acquired), right foot: Secondary | ICD-10-CM | POA: Diagnosis present

## 2020-11-22 DIAGNOSIS — B870 Cutaneous myiasis: Secondary | ICD-10-CM | POA: Diagnosis present

## 2020-11-22 DIAGNOSIS — Z9181 History of falling: Secondary | ICD-10-CM

## 2020-11-22 DIAGNOSIS — A4102 Sepsis due to Methicillin resistant Staphylococcus aureus: Principal | ICD-10-CM | POA: Diagnosis present

## 2020-11-22 DIAGNOSIS — R059 Cough, unspecified: Secondary | ICD-10-CM

## 2020-11-22 DIAGNOSIS — E871 Hypo-osmolality and hyponatremia: Secondary | ICD-10-CM | POA: Diagnosis present

## 2020-11-22 DIAGNOSIS — Z597 Insufficient social insurance and welfare support: Secondary | ICD-10-CM

## 2020-11-22 DIAGNOSIS — Z6829 Body mass index (BMI) 29.0-29.9, adult: Secondary | ICD-10-CM

## 2020-11-22 DIAGNOSIS — F39 Unspecified mood [affective] disorder: Secondary | ICD-10-CM | POA: Diagnosis present

## 2020-11-22 DIAGNOSIS — B879 Myiasis, unspecified: Secondary | ICD-10-CM | POA: Diagnosis present

## 2020-11-22 DIAGNOSIS — K8020 Calculus of gallbladder without cholecystitis without obstruction: Secondary | ICD-10-CM | POA: Diagnosis present

## 2020-11-22 DIAGNOSIS — Z5902 Unsheltered homelessness: Secondary | ICD-10-CM

## 2020-11-22 DIAGNOSIS — E860 Dehydration: Secondary | ICD-10-CM | POA: Diagnosis present

## 2020-11-22 DIAGNOSIS — R209 Unspecified disturbances of skin sensation: Secondary | ICD-10-CM | POA: Diagnosis present

## 2020-11-22 DIAGNOSIS — F1020 Alcohol dependence, uncomplicated: Secondary | ICD-10-CM | POA: Diagnosis present

## 2020-11-22 DIAGNOSIS — F17210 Nicotine dependence, cigarettes, uncomplicated: Secondary | ICD-10-CM | POA: Diagnosis present

## 2020-11-22 DIAGNOSIS — L03115 Cellulitis of right lower limb: Secondary | ICD-10-CM | POA: Diagnosis present

## 2020-11-22 DIAGNOSIS — R278 Other lack of coordination: Secondary | ICD-10-CM | POA: Diagnosis present

## 2020-11-22 DIAGNOSIS — E538 Deficiency of other specified B group vitamins: Secondary | ICD-10-CM | POA: Diagnosis present

## 2020-11-22 DIAGNOSIS — R413 Other amnesia: Secondary | ICD-10-CM | POA: Diagnosis present

## 2020-11-22 DIAGNOSIS — I10 Essential (primary) hypertension: Secondary | ICD-10-CM | POA: Diagnosis present

## 2020-11-22 DIAGNOSIS — R531 Weakness: Secondary | ICD-10-CM

## 2020-11-22 DIAGNOSIS — H5462 Unqualified visual loss, left eye, normal vision right eye: Secondary | ICD-10-CM | POA: Diagnosis present

## 2020-11-22 DIAGNOSIS — D7589 Other specified diseases of blood and blood-forming organs: Secondary | ICD-10-CM | POA: Diagnosis present

## 2020-11-22 DIAGNOSIS — D638 Anemia in other chronic diseases classified elsewhere: Secondary | ICD-10-CM | POA: Diagnosis present

## 2020-11-22 DIAGNOSIS — I69398 Other sequelae of cerebral infarction: Secondary | ICD-10-CM

## 2020-11-22 DIAGNOSIS — M6282 Rhabdomyolysis: Secondary | ICD-10-CM | POA: Diagnosis present

## 2020-11-22 DIAGNOSIS — M24561 Contracture, right knee: Secondary | ICD-10-CM | POA: Diagnosis present

## 2020-11-22 LAB — ETHANOL: Alcohol, Ethyl (B): <10 mg/dL (ref <10)

## 2020-11-22 LAB — CK: Total CK: 818 U/L — ABNORMAL HIGH (ref 49–397)

## 2020-11-22 LAB — COMPREHENSIVE METABOLIC PANEL
ALT: 23 U/L (ref 0–44)
AST: 46 U/L — ABNORMAL HIGH (ref 15–41)
Albumin: 2.2 g/dL — ABNORMAL LOW (ref 3.5–5.0)
Alkaline Phosphatase: 59 U/L (ref 38–126)
Anion gap: 9 (ref 5–15)
BUN: 24 mg/dL — ABNORMAL HIGH (ref 6–20)
CO2: 21 mmol/L — ABNORMAL LOW (ref 22–32)
Calcium: 7.6 mg/dL — ABNORMAL LOW (ref 8.9–10.3)
Chloride: 96 mmol/L — ABNORMAL LOW (ref 98–111)
Creatinine, Ser: 1.24 mg/dL (ref 0.61–1.24)
GFR, Estimated: >60 mL/min (ref >60)
Glucose, Bld: 113 mg/dL — ABNORMAL HIGH (ref 70–99)
Potassium: 4.2 mmol/L (ref 3.5–5.1)
Sodium: 126 mmol/L — ABNORMAL LOW (ref 135–145)
Total Bilirubin: 1.8 mg/dL — ABNORMAL HIGH (ref 0.3–1.2)
Total Protein: 6 g/dL — ABNORMAL LOW (ref 6.5–8.1)

## 2020-11-22 LAB — LACTIC ACID, PLASMA: Lactic Acid, Venous: 1.7 mmol/L (ref 0.5–1.9)

## 2020-11-22 LAB — CBC
HCT: 38.9 % — ABNORMAL LOW (ref 39.0–52.0)
Hemoglobin: 13 g/dL (ref 13.0–17.0)
MCH: 33.6 pg (ref 26.0–34.0)
MCHC: 33.4 g/dL (ref 30.0–36.0)
MCV: 100.5 fL — ABNORMAL HIGH (ref 80.0–100.0)
Platelets: 153 10*3/uL (ref 150–400)
RBC: 3.87 MIL/uL — ABNORMAL LOW (ref 4.22–5.81)
RDW: 13.8 % (ref 11.5–15.5)
WBC: 15.4 10*3/uL — ABNORMAL HIGH (ref 4.0–10.5)
nRBC: 0 % (ref 0.0–0.2)

## 2020-11-22 MED ORDER — VANCOMYCIN HCL 1500 MG/300ML IV SOLN
1500.0000 mg | Freq: Once | INTRAVENOUS | Status: AC
  Administered 2020-11-23: 1500 mg via INTRAVENOUS
  Filled 2020-11-22: qty 300

## 2020-11-22 MED ORDER — SODIUM CHLORIDE 0.9 % IV SOLN
2.0000 g | Freq: Once | INTRAVENOUS | Status: AC
  Administered 2020-11-23: 2 g via INTRAVENOUS
  Filled 2020-11-22: qty 2

## 2020-11-22 MED ORDER — SODIUM CHLORIDE 0.9 % IV BOLUS
1000.0000 mL | Freq: Once | INTRAVENOUS | Status: AC
  Administered 2020-11-22: 1000 mL via INTRAVENOUS

## 2020-11-22 MED ORDER — SODIUM CHLORIDE 0.9 % IV BOLUS
1000.0000 mL | Freq: Once | INTRAVENOUS | Status: AC
  Administered 2020-11-23: 1000 mL via INTRAVENOUS

## 2020-11-22 NOTE — ED Provider Notes (Signed)
[facility] EMERGENCY DEPARTMENT Provider Note   CSN: [id number] Arrival date & time: 11/22/20  1949     History Chief Complaint  Patient presents with   Wound Infection   Weakness    Robert Frank is a 55 y.o. male.  Patient brought to ED via EMS - EMS called as pt homeless, living in abandoned home, with foul smelling wounds to skin esp right lower leg and foot, w maggots on wound, and patient lying in urine/stool. Pt unsure how long he had been there. Hx etoh abuse. Symptoms acute onset, moderate-severe, persistent. Pt denies headache. No neck/back pain. No cp or sob. No abd pain or nv. Recent poor po intake.  The history is provided by the patient and the EMS personnel. The history is limited by the condition of the patient.      Past Medical History:  Diagnosis Date   Alcoholism (HCC)    Blind left eye    COPD (chronic obstructive pulmonary disease) (HCC)    CVA (cerebral vascular accident) (HCC)    12 years ago- residual R sided numbness   Hypertension     Patient Active Problem List   Diagnosis Date Noted   Abscess of left lower leg    Anemia of chronic disease 12/18/2019   Essential hypertension 12/17/2019   Bilateral cellulitis of lower leg 12/16/2019   Bilateral lower leg cellulitis 12/16/2019   Hypokalemia 12/05/2019   Hypomagnesemia 12/05/2019   Generalized weakness 12/04/2019   Transaminitis 12/04/2019   Decubitus skin ulcer 12/04/2019   Hyponatremia 12/04/2019   Alcohol withdrawal (HCC) 08/17/2019   Encephalopathy acute 08/17/2019   Alcoholism (HCC)    Cellulitis    COPD exacerbation (HCC) 10/07/2018   COPD (chronic obstructive pulmonary disease) (HCC) 10/06/2018   COPD with acute exacerbation (HCC) 10/06/2018    Past Surgical History:  Procedure Laterality Date   ANTERIOR CRUCIATE LIGAMENT REPAIR     I & D EXTREMITY Left 12/21/2019   Procedure: IRRIGATION AND DEBRIDEMENT LEFT CALF ABSCESS;  Surgeon: Cammy Copa,  MD;  Location: [facility] OR;  Service: Orthopedics;  Laterality: Left;   I & D EXTREMITY Left 12/28/2019   Procedure: IRRIGATION AND DEBRIDEMENT LEFT CALF ABSCESS;  Surgeon: Nadara Mustard, MD;  Location: [facility] OR;  Service: Orthopedics;  Laterality: Left;       History reviewed. No pertinent family history.  Social History   Tobacco Use   Smoking status: Every Day    Packs/day: 1.00    Pack years: 0.00    Types: Cigarettes   Smokeless tobacco: Never  Substance Use Topics   Alcohol use: Yes    Comment: 5 40 oz/day   Drug use: Not Currently    Home Medications Prior to Admission medications   Medication Sig Start Date End Date Taking? Authorizing Provider  Metoprolol Tartrate 75 MG TABS Take 75 mg by mouth 2 (two) times daily. Patient not taking: No sig reported 01/04/20 02/03/20  Rhetta Mura, MD  Multiple Vitamin (MULTIVITAMIN WITH MINERALS) TABS tablet Take 1 tablet by mouth daily. Patient not taking: No sig reported 12/13/19   Rolly Salter, MD  thiamine 100 MG tablet Take 1 tablet (100 mg total) by mouth daily. Patient not taking: No sig reported 12/13/19   Rolly Salter, MD    Allergies    Patient has no known allergies.  Review of Systems   Review of Systems  Constitutional:  Negative for fever.  HENT:  Negative for sore throat.  Eyes:  Negative for redness.  Respiratory:  Negative for shortness of breath.   Cardiovascular:  Negative for chest pain.  Gastrointestinal:  Negative for abdominal pain and vomiting.  Genitourinary:  Negative for flank pain.  Musculoskeletal:  Negative for back pain and neck pain.  Skin:  Negative for rash.  Neurological:  Negative for headaches.  Hematological:  Does not bruise/bleed easily.  Psychiatric/Behavioral:  Negative for agitation.    Physical Exam Updated Vital Signs BP (!) 132/104   Pulse (!) 101   Temp 98.1 F (36.7 C) (Oral)   Resp (!) 22   SpO2 100%   Physical Exam Vitals and nursing note reviewed.   Constitutional:      Appearance: Normal appearance. He is well-developed.  HENT:     Head: Atraumatic.     Nose: Nose normal.     Mouth/Throat:     Mouth: Mucous membranes are moist.     Pharynx: Oropharynx is clear.  Eyes:     General: No scleral icterus.    Conjunctiva/sclera: Conjunctivae normal.  Neck:     Vascular: No carotid bruit.     Trachea: No tracheal deviation.  Cardiovascular:     Rate and Rhythm: Normal rate and regular rhythm.     Pulses: Normal pulses.     Heart sounds: Normal heart sounds. No murmur heard.   No friction rub. No gallop.  Pulmonary:     Effort: Pulmonary effort is normal. No accessory muscle usage or respiratory distress.     Breath sounds: Normal breath sounds.  Abdominal:     General: Bowel sounds are normal. There is no distension.     Palpations: Abdomen is soft.     Tenderness: There is no abdominal tenderness. There is no guarding.  Genitourinary:    Comments: No cva tenderness. Musculoskeletal:     Cervical back: Normal range of motion and neck supple. No rigidity.     Comments: Chronic wounds right foot, covered in maggots and flies, with associated cellulitis. CTLS spine, non tender, aligned, no step off.   Skin:    General: Skin is warm and dry.     Findings: No rash.     Comments: Cellulitis right foot/lower leg. Mild skin breakdown, superficial to bilateral posterior thigh/buttock area, no necrotic or devitalized tissue, no crepitus, no purulent drainage.   Neurological:     Mental Status: He is alert.     Comments: Alert, speech clear. Motor/sens grossly intact bil.   Psychiatric:        Mood and Affect: Mood normal.     ED Results / Procedures / Treatments   Labs (all labs ordered are listed, but only abnormal results are displayed) Results for orders placed or performed during the hospital encounter of 11/22/20  CBC  Result Value Ref Range   WBC 15.4 (H) 4.0 - 10.5 K/uL   RBC 3.87 (L) 4.22 - 5.81 MIL/uL   Hemoglobin  13.0 13.0 - 17.0 g/dL   HCT 14.7 (L) 82.9 - 56.2 %   MCV 100.5 (H) 80.0 - 100.0 fL   MCH 33.6 26.0 - 34.0 pg   MCHC 33.4 30.0 - 36.0 g/dL   RDW 13.0 86.5 - 78.4 %   Platelets 153 150 - 400 K/uL   nRBC 0.0 0.0 - 0.2 %  Comprehensive metabolic panel  Result Value Ref Range   Sodium 126 (L) 135 - 145 mmol/L   Potassium 4.2 3.5 - 5.1 mmol/L   Chloride 96 (L) 98 -  111 mmol/L   CO2 21 (L) 22 - 32 mmol/L   Glucose, Bld 113 (H) 70 - 99 mg/dL   BUN 24 (H) 6 - 20 mg/dL   Creatinine, Ser 2.44 0.61 - 1.24 mg/dL   Calcium 7.6 (L) 8.9 - 10.3 mg/dL   Total Protein 6.0 (L) 6.5 - 8.1 g/dL   Albumin 2.2 (L) 3.5 - 5.0 g/dL   AST 46 (H) 15 - 41 U/L   ALT 23 0 - 44 U/L   Alkaline Phosphatase 59 38 - 126 U/L   Total Bilirubin 1.8 (H) 0.3 - 1.2 mg/dL   GFR, Estimated >01 >02 mL/min   Anion gap 9 5 - 15  Ethanol  Result Value Ref Range   Alcohol, Ethyl (B) <10 <10 mg/dL  Lactic acid, plasma  Result Value Ref Range   Lactic Acid, Venous 1.7 0.5 - 1.9 mmol/L  CK  Result Value Ref Range   Total CK 818 (H) 49 - 397 U/L    EKG EKG Interpretation  Date/Time:  Thursday November 22 2020 20:47:28 EDT Ventricular Rate:  104 PR Interval:  147 QRS Duration: 89 QT Interval:  364 QTC Calculation: 479 R Axis:   46 Text Interpretation: Sinus tachycardia Borderline prolonged QT interval Confirmed by Cathren Laine ([id number]) on 11/22/2020 8:59:29 PM  Radiology DG Chest Port 1 View  Result Date: 11/22/2020 CLINICAL DATA:  Weakness EXAM: PORTABLE CHEST 1 VIEW COMPARISON:  08/27/2020 FINDINGS: Heart and mediastinal contours are within normal limits. No focal opacities or effusions. No acute bony abnormality. Old healed left rib fractures. IMPRESSION: No active disease. Electronically Signed   By: Charlett Nose M.D.   On: 11/22/2020 21:30    Procedures Procedures   Medications Ordered in ED Medications  sodium chloride 0.9 % bolus 1,000 mL (has no administration in time range)    ED Course  I have reviewed  the triage vital signs and the nursing notes.  Pertinent labs & imaging results that were available during my care of the patient were reviewed by me and considered in my medical decision making (see chart for details).    MDM Rules/Calculators/A&P                         Iv ns. Continuous pulse ox and cardiac monitoring. Stat labs.   Patient initially taken to decon room as pts back side and legs covering in foul smelling urine/stool, flies and maggots.   Reviewed nursing notes and prior charts for additional history.   Labs reviewed/interpreted by me - wbc elevated. Na low. Alb low. Ck elevated. Additional ivf.   Xrays reviewed/interpreted by me - no pna.   Iv ns bolus.   After cultures, iv abx given.   Unassigned medicine consulted for admission.   CRITICAL CARE RE: hyponatremia, dehydration, severe prot/cal malnutrition, cellulitis, elevated ck Performed by: Suzi Roots Total critical care time: 45 minutes Critical care time was exclusive of separately billable procedures and treating other patients. Critical care was necessary to treat or prevent imminent or life-threatening deterioration. Critical care was time spent personally by me on the following activities: development of treatment plan with patient and/or surrogate as well as nursing, discussions with consultants, evaluation of patient's response to treatment, examination of patient, obtaining history from patient or surrogate, ordering and performing treatments and interventions, ordering and review of laboratory studies, ordering and review of radiographic studies, pulse oximetry and re-evaluation of patient's condition.    Final Clinical  Impression(s) / ED Diagnoses Final diagnoses:  None    Rx / DC Orders ED Discharge Orders     None        Cathren LaineSteinl, Deni Lefever, MD 11/22/20 2315

## 2020-11-22 NOTE — ED Notes (Signed)
Unable to obtain blood specimens at this time. Multiple attempts were made by two nurses. Phlebotomy called to try.

## 2020-11-22 NOTE — ED Triage Notes (Signed)
BIB [facility] from uninhabitable home. Pt lives with brother who called EMS. When EMS arrived pt was sitting on his front porch covered in feces and urine and other bodily fluids. There is a large amount of maggots to the right foot and possible bedbugs. Pt is unable to care for himself. Pt was taken to decon when arrived to ER.   130/80 130 heart rate 95%  room air CBG 149

## 2020-11-22 NOTE — ED Notes (Signed)
Still unable to obtain labs at this time.

## 2020-11-22 NOTE — Hospital Course (Addendum)
Right lower extremity cellulitis, meets sepsis criteria Patient presented with right lower extremity cellulitis in the setting of homelessness and chronic lower extremity wounds.  Patient was brought to the ED after being found on his porch.  On admission patient was tachycardic with leukocytosis, CXR without active disease.  Right lower extremity with erythema, drainage, foul odor, covered in maggots.  Blood cultures obtained and patient started on vancomycin and cefepime.  Wound care nurse was consulted and completed conservative sharp wound debridement and collected wound culture.  CT right ankle and foot with contrast without evidence of osteomyelitis and no drainable fluid collection.  Completed 10 day course of antibiotics.  Soreness of foot improved with no sign of infection.  Rhabdomyolysis-resolved Patient found down for several hours upon presentation.  CK was elevated in the 800s on admission and patient was given fluid resuscitation with subsequent improvement.  Resolved by time of discharge.   Hyponatremia Current NA 126, in the setting of alcohol use and likely dehydration/malnutrition. Patient has been hospitalized with hyponatremia multiple times.  Improved to 133 by time of discharge.  Homeless  difficult social situation Patient was found by EMS living in abandoned house on the porch covered in feces and urine. Patient reports living with his brother and without running water or electricity in the home.    History of alcohol use  Hepatomegaly Patient reports drinking a 4 pack of "tall beers" daily when he is able to afford it.  States that his last drink was 1 to 2 days prior to presenting to the ED.  AST/ALT 46/23.  Physical exam with hepatomegaly without signs of abdominal distention. No symptoms of withdrawal while present.

## 2020-11-22 NOTE — H&P (Addendum)
[facility] History and Physical Service Pager: [phone]  Patient name: Robert SpringMichael S Frank Medical record number: [id number] Date of birth: [birth year] Age: 55 y.o. Gender: male  Primary Care Provider: Pcp, No Consultants: Wound Care, Transitions of care  Code Status: Full Code  Preferred Emergency Contact: patient states he does not have an emergency contact, but there is one listed in the chart.  Patient has a brother but states he does not have a cell phone Contact Information     Name Relation Home Work [employer]   Goode, Steve Other   [phone]      Chief Complaint: Right foot pain   Assessment and Plan: Robert Frank is a 55 y.o. male presenting with right foot pain. PMH is significant for HTN, COPD, alcohol use, left eye blindness.   Right lower extremity cellulitis, meets sepsis criteria Patient presented with right lower extremity cellulitis in the setting of homelessness and chronic lower extremity wounds with multiple prior visits requiring antibiotic treatment.  Patient was brought to the ED by EMS after being found on the porch covered in his own waist.  On admission, patient was afebrile with leukocytosis of 15.4, tachycardic to 107, lactic acid 1.7, CXR without active disease.  Right lower extremity with erythema, drainage, foul odor and covered in maggots and other bugs.  Blood cultures were obtained and patient was started on vancomycin and cefepime.  Palpable peripheral pulses in BLE, no known history of DM but multiple admissions for cellulitis.  Consideration for possible underlying osteomyelitis given chronicity of wounds.  No known initial wound, but patient unsure of duration of the infection. - Admit to med-surg, attending Dr. Deirdre Priesthambliss  - Continue vanc and Cefepime  - Continuous cardiac telemetry - Follow-up A1c - Follow-up blood cultures  - Follow up right foot x-rays - Consider surgical consult for debridement - MRI of  right foot ordered for 1000 11/23/20 - Wound care consult  - TOC consult - PT/OT consult - Tylenol PRN   Rhabdomyolysis Patient found down, likely for extended period of time in his own feces.  CK on admission elevated 818. In the ED, patient received total of 2 L of NS. - NS @ 17300mL/hr  - Monitor CK until down trending  - Monitor creatinine   Hyponatremia Current NA 126, in the setting of alcohol use and likely dehydration/malnutrition. Patient has been hospitalized with hyponatremia multiple times.  - normal saline mIVF at 11600mL/hr  - Monitor sodium, daily   Homeless  difficult social situation Patient was found by EMS living in abandoned house on the porch covered in feces and urine. Patient reports living with his brother and without running water or electricity in the home.  Patient reports no emergency contact, though someone is listed in the chart. - TOC consult  History of alcohol use  Hepatomegaly  Patient reports drinking a 4 pack of "tall beers" daily when he is able to afford it.  States that his last drink was 1 to 2 days prior to presenting to the ED.  AST/ALT 46/23.  Physical exam with hepatomegaly without signs of abdominal distention. - RUQ US - CIWAs without Ativan - Folic acid 1 mg daily - Thiamine 100 mg daily  Tobacco use  ?COPD  Wheeze Patient smokes when he is able to, unsure of average amount smoked per day, but will smoke up to 1 pack/day if he is able.  Physical exam with audible wheeze. - NicoDerm patch daily - DuoNebs every  6 hours   FEN/GI: heart healthy diet   Prophylaxis: heparin subcutaneous 5,000units q8 hours   Disposition: med-surg  History of Present Illness:  Robert Frank is a 55 y.o. male presenting with right foot pain.   History history was difficult to obtain as patient is unsure of timeline and not reporting much in the way of symptoms.      Patient reports that he last remembers being out on his porch earlier today and  remembers getting to the hospital via ambulance but is unsure of why someone else called the ambulance for him.  When initially asked why he was here he stated "that something may be wrong with my me".  Patient denies taking any medications at this time and is unaware of any medical problems including diabetes or blood pressure.  He denies current chest pain, difficulty breathing.  He does report that he has foot pain "in the one that is all purple".  He is unsure for how long his foot has been like this-though when clarifying he stated it was quicker than a few months. He states that it is painful to walk and he sometimes uses a wheelchair.  Patient reports that his last drink was yesterday, he normally drinks "a tall beer" and will drink a 4 pack when he can afford it.  He denies recreational or IV drug use.  Reports smoking daily and is smoked since she was 55 years old, inconsistent on how much he smokes but will smoke up to a pack a day if he has money for that.  Patient also reports that he has had blood in his urine a few months ago when he was in the waiting room at the ED and had to use the restroom.     Review Of Systems: Per HPI with the following additions:   Review of Systems  Constitutional:  Negative for fever.  Eyes:  Negative for visual disturbance.  Respiratory:  Positive for cough. Negative for shortness of breath and wheezing.   Cardiovascular:  Negative for chest pain.  Gastrointestinal:  Negative for abdominal pain, nausea and vomiting.  Genitourinary:  Positive for hematuria. Negative for dysuria.  Skin:  Positive for wound.  Neurological:  Negative for syncope, light-headedness and headaches.  Psychiatric/Behavioral:  Negative for hallucinations.     Patient Active Problem List   Diagnosis Date Noted   Cellulitis of right lower extremity 11/23/2020   Abscess of left lower leg    Anemia of chronic disease 12/18/2019   Essential hypertension 12/17/2019   Bilateral  cellulitis of lower leg 12/16/2019   Bilateral lower leg cellulitis 12/16/2019   Hypokalemia 12/05/2019   Hypomagnesemia 12/05/2019   Generalized weakness 12/04/2019   Transaminitis 12/04/2019   Decubitus skin ulcer 12/04/2019   Hyponatremia 12/04/2019   Alcohol withdrawal (HCC) 08/17/2019   Encephalopathy acute 08/17/2019   Alcoholism (HCC)    Cellulitis    COPD exacerbation (HCC) 10/07/2018   COPD (chronic obstructive pulmonary disease) (HCC) 10/06/2018   COPD with acute exacerbation (HCC) 10/06/2018    Past Medical History: Past Medical History:  Diagnosis Date   Alcoholism (HCC)    Blind left eye    COPD (chronic obstructive pulmonary disease) (HCC)    CVA (cerebral vascular accident) (HCC)    12 years ago- residual R sided numbness   Hypertension     Past Surgical History: Past Surgical History:  Procedure Laterality Date   ANTERIOR CRUCIATE LIGAMENT REPAIR     I & D  EXTREMITY Left 12/21/2019   Procedure: IRRIGATION AND DEBRIDEMENT LEFT CALF ABSCESS;  Surgeon: Cammy Copa, MD;  Location: [facility] OR;  Service: Orthopedics;  Laterality: Left;   I & D EXTREMITY Left 12/28/2019   Procedure: IRRIGATION AND DEBRIDEMENT LEFT CALF ABSCESS;  Surgeon: Nadara Mustard, MD;  Location: [facility] OR;  Service: Orthopedics;  Laterality: Left;    Social History: Social History   Tobacco Use   Smoking status: Every Day    Packs/day: 1.00    Pack years: 0.00    Types: Cigarettes   Smokeless tobacco: Never  Substance Use Topics   Alcohol use: Yes    Comment: 5 40 oz/day   Drug use: Not Currently   Additional social history: lives in an abandoned home  Please also refer to relevant sections of EMR.  Patient reports that he lives with his brother and they currently do not have running water or power.   Family History: History reviewed. No pertinent family history.  Allergies and Medications: No Known Allergies No current facility-administered medications on file prior to  encounter.   Current Outpatient Medications on File Prior to Encounter  Medication Sig Dispense Refill   Metoprolol Tartrate 75 MG TABS Take 75 mg by mouth 2 (two) times daily. (Patient not taking: No sig reported) 60 tablet 3   Multiple Vitamin (MULTIVITAMIN WITH MINERALS) TABS tablet Take 1 tablet by mouth daily. (Patient not taking: No sig reported) 120 tablet 0   thiamine 100 MG tablet Take 1 tablet (100 mg total) by mouth daily. (Patient not taking: No sig reported) 60 tablet 0    Objective: BP 109/79   Pulse (!) 108   Temp 98.1 F (36.7 C) (Oral)   Resp (!) 21   SpO2 100%  Exam: General -- cooperative, difficult historian HEENT -- Head is normocephalic. PERRLA. EOMI. Ears, nose and throat were benign. Neck -- supple; no bruits. Integument -- multiple open areas/scratches on skin including upper back. Right lower extremity wound and erythema as pictured below Chest -- good expansion. Wheeze in b/l upper lobes, no rales noted Cardiac -- tachycardia  107. No murmurs noted.  Abdomen -- soft, nontender. Significant hepatomegaly on exam. Bowel sounds present. Genital, rectal and breast exam -- deferred. CNS -- cranial nerves II through XII grossly intact. A&O x3 Extremities - significant erythema in the RLE as pictured below, foul odor present, drainage from wound site, extremely tender to touch. Left lower extremity with hyperpigmentation on the shin.               Labs and Imaging: CBC BMET  Recent Labs  Lab 11/22/20 2220  WBC 15.4*  HGB 13.0  HCT 38.9*  PLT 153   Recent Labs  Lab 11/22/20 2220  NA 126*  K 4.2  CL 96*  CO2 21*  BUN 24*  CREATININE 1.24  GLUCOSE 113*  CALCIUM 7.6*     EKG: Sinus tachycardia   DG Ankle Complete Right  Result Date: 11/23/2020 CLINICAL DATA:  Right lower extremity cellulitis EXAM: RIGHT ANKLE - COMPLETE 3+ VIEW COMPARISON:  Foot series today FINDINGS: No acute bony abnormality. Specifically, no fracture, subluxation, or  dislocation. Severe hallux valgus deformity. No bone destruction. Degenerative changes in the midfoot and hindfoot. IMPRESSION: No acute bony abnormality. Electronically Signed   By: Charlett Nose M.D.   On: 11/23/2020 03:07   DG Chest Port 1 View  Result Date: 11/22/2020 CLINICAL DATA:  Weakness EXAM: PORTABLE CHEST 1 VIEW COMPARISON:  08/27/2020 FINDINGS:  Heart and mediastinal contours are within normal limits. No focal opacities or effusions. No acute bony abnormality. Old healed left rib fractures. IMPRESSION: No active disease. Electronically Signed   By: Charlett Nose M.D.   On: 11/22/2020 21:30   DG Foot Complete Right  Result Date: 11/23/2020 CLINICAL DATA:  Right lower extremity cellulitis EXAM: RIGHT FOOT COMPLETE - 3+ VIEW COMPARISON:  None. FINDINGS: Severe hallux valgus deformity. No fracture. No visible bone destruction to suggest osteomyelitis. Soft tissues intact. No soft tissue gas. IMPRESSION: No acute bony abnormality. Electronically Signed   By: Charlett Nose M.D.   On: 11/23/2020 03:06      Evelena Leyden DO  PGY-1, [facility] FPTS Intern pager: [phone], text pages welcome  FPTS Upper-Level Resident Addendum   I have independently interviewed and examined the patient. I have discussed the above with the original author and agree with their documentation. My edits for correction/addition/clarification are included above. Please see any attending notes.   Ronnald Ramp, MD PGY-2, [facility] 11/23/2020 3:19 AM  FPTS Service pager: [phone] (text pages welcome through [facility])

## 2020-11-23 ENCOUNTER — Observation Stay (HOSPITAL_COMMUNITY): Payer: Self-pay

## 2020-11-23 DIAGNOSIS — E871 Hypo-osmolality and hyponatremia: Secondary | ICD-10-CM

## 2020-11-23 DIAGNOSIS — E860 Dehydration: Secondary | ICD-10-CM

## 2020-11-23 DIAGNOSIS — R748 Abnormal levels of other serum enzymes: Secondary | ICD-10-CM | POA: Diagnosis present

## 2020-11-23 DIAGNOSIS — E8809 Other disorders of plasma-protein metabolism, not elsewhere classified: Secondary | ICD-10-CM | POA: Diagnosis present

## 2020-11-23 DIAGNOSIS — M6282 Rhabdomyolysis: Secondary | ICD-10-CM

## 2020-11-23 DIAGNOSIS — E876 Hypokalemia: Secondary | ICD-10-CM

## 2020-11-23 DIAGNOSIS — E46 Unspecified protein-calorie malnutrition: Secondary | ICD-10-CM

## 2020-11-23 DIAGNOSIS — A419 Sepsis, unspecified organism: Secondary | ICD-10-CM

## 2020-11-23 DIAGNOSIS — E43 Unspecified severe protein-calorie malnutrition: Secondary | ICD-10-CM | POA: Diagnosis present

## 2020-11-23 DIAGNOSIS — L03116 Cellulitis of left lower limb: Secondary | ICD-10-CM

## 2020-11-23 DIAGNOSIS — K8020 Calculus of gallbladder without cholecystitis without obstruction: Secondary | ICD-10-CM

## 2020-11-23 DIAGNOSIS — Z8659 Personal history of other mental and behavioral disorders: Secondary | ICD-10-CM

## 2020-11-23 DIAGNOSIS — R531 Weakness: Secondary | ICD-10-CM

## 2020-11-23 DIAGNOSIS — L03115 Cellulitis of right lower limb: Secondary | ICD-10-CM | POA: Diagnosis present

## 2020-11-23 LAB — CBC
HCT: 35.8 % — ABNORMAL LOW (ref 39.0–52.0)
Hemoglobin: 12.7 g/dL — ABNORMAL LOW (ref 13.0–17.0)
MCH: 34.5 pg — ABNORMAL HIGH (ref 26.0–34.0)
MCHC: 35.5 g/dL (ref 30.0–36.0)
MCV: 97.3 fL (ref 80.0–100.0)
Platelets: 147 10*3/uL — ABNORMAL LOW (ref 150–400)
RBC: 3.68 MIL/uL — ABNORMAL LOW (ref 4.22–5.81)
RDW: 13.6 % (ref 11.5–15.5)
WBC: 14.2 10*3/uL — ABNORMAL HIGH (ref 4.0–10.5)
nRBC: 0 % (ref 0.0–0.2)

## 2020-11-23 LAB — BASIC METABOLIC PANEL
Anion gap: 9 (ref 5–15)
BUN: 19 mg/dL (ref 6–20)
CO2: 21 mmol/L — ABNORMAL LOW (ref 22–32)
Calcium: 7.5 mg/dL — ABNORMAL LOW (ref 8.9–10.3)
Chloride: 97 mmol/L — ABNORMAL LOW (ref 98–111)
Creatinine, Ser: 0.94 mg/dL (ref 0.61–1.24)
GFR, Estimated: >60 mL/min (ref >60)
Glucose, Bld: 106 mg/dL — ABNORMAL HIGH (ref 70–99)
Potassium: 3.2 mmol/L — ABNORMAL LOW (ref 3.5–5.1)
Sodium: 127 mmol/L — ABNORMAL LOW (ref 135–145)

## 2020-11-23 LAB — HEPATIC FUNCTION PANEL
ALT: 22 U/L (ref 0–44)
AST: 44 U/L — ABNORMAL HIGH (ref 15–41)
Albumin: 2.2 g/dL — ABNORMAL LOW (ref 3.5–5.0)
Alkaline Phosphatase: 54 U/L (ref 38–126)
Bilirubin, Direct: 0.4 mg/dL — ABNORMAL HIGH (ref 0.0–0.2)
Indirect Bilirubin: 0.9 mg/dL (ref 0.3–0.9)
Total Bilirubin: 1.3 mg/dL — ABNORMAL HIGH (ref 0.3–1.2)
Total Protein: 5.7 g/dL — ABNORMAL LOW (ref 6.5–8.1)

## 2020-11-23 LAB — PROTIME-INR
INR: 1.2 (ref 0.8–1.2)
Prothrombin Time: 15.3 seconds — ABNORMAL HIGH (ref 11.4–15.2)

## 2020-11-23 LAB — RESP PANEL BY RT-PCR (FLU A&B, COVID) ARPGX2
Influenza A by PCR: NEGATIVE
Influenza B by PCR: NEGATIVE
SARS Coronavirus 2 by RT PCR: NEGATIVE

## 2020-11-23 LAB — C-REACTIVE PROTEIN: CRP: 22.3 mg/dL — ABNORMAL HIGH (ref <1.0)

## 2020-11-23 LAB — SEDIMENTATION RATE: Sed Rate: 38 mm/hr — ABNORMAL HIGH (ref 0–16)

## 2020-11-23 LAB — HIV ANTIBODY (ROUTINE TESTING W REFLEX): HIV Screen 4th Generation wRfx: NONREACTIVE

## 2020-11-23 LAB — PREALBUMIN: Prealbumin: <5 mg/dL — ABNORMAL LOW (ref 18–38)

## 2020-11-23 LAB — LIPASE, BLOOD: Lipase: 65 U/L — ABNORMAL HIGH (ref 11–51)

## 2020-11-23 LAB — CK: Total CK: 693 U/L — ABNORMAL HIGH (ref 49–397)

## 2020-11-23 MED ORDER — IPRATROPIUM-ALBUTEROL 0.5-2.5 (3) MG/3ML IN SOLN: 3.0000 mL | Freq: Four times a day (QID) | RESPIRATORY_TRACT | Status: DC | PRN

## 2020-11-23 MED ORDER — ENSURE ENLIVE PO LIQD: 237.0000 mL | Freq: Two times a day (BID) | ORAL | Status: DC

## 2020-11-23 MED ORDER — BACITRACIN ZINC 500 UNIT/GM EX OINT
TOPICAL_OINTMENT | Freq: Two times a day (BID) | CUTANEOUS | Status: DC
  Administered 2020-11-25 – 2020-12-17 (×4): 1 via TOPICAL
  Filled 2020-11-23 (×4): qty 28.4

## 2020-11-23 MED ORDER — HEPARIN SODIUM (PORCINE) 5000 UNIT/ML IJ SOLN
5000.0000 [IU] | Freq: Three times a day (TID) | INTRAMUSCULAR | Status: DC
  Administered 2020-11-23 – 2020-11-25 (×6): 5000 [IU] via SUBCUTANEOUS
  Filled 2020-11-23 (×6): qty 1

## 2020-11-23 MED ORDER — ACETAMINOPHEN 325 MG PO TABS
650.0000 mg | ORAL_TABLET | Freq: Four times a day (QID) | ORAL | Status: DC | PRN
  Filled 2020-11-23: qty 2

## 2020-11-23 MED ORDER — SODIUM CHLORIDE 0.9 % IV SOLN: INTRAVENOUS | Status: DC

## 2020-11-23 MED ORDER — JUVEN PO PACK
1.0000 | PACK | Freq: Two times a day (BID) | ORAL | Status: DC
  Administered 2020-11-24 – 2020-12-18 (×47): 1 via ORAL
  Filled 2020-11-23 (×43): qty 1

## 2020-11-23 MED ORDER — PROSOURCE PLUS PO LIQD
30.0000 mL | Freq: Three times a day (TID) | ORAL | Status: DC
  Administered 2020-11-23 – 2020-12-18 (×68): 30 mL via ORAL
  Filled 2020-11-23 (×60): qty 30

## 2020-11-23 MED ORDER — ENSURE ENLIVE PO LIQD
237.0000 mL | Freq: Three times a day (TID) | ORAL | Status: DC
  Administered 2020-11-23 – 2020-11-24 (×2): 237 mL via ORAL

## 2020-11-23 MED ORDER — SODIUM CHLORIDE 0.9 % IV SOLN
2.0000 g | Freq: Three times a day (TID) | INTRAVENOUS | Status: DC
  Administered 2020-11-23 – 2020-11-26 (×9): 2 g via INTRAVENOUS
  Filled 2020-11-23 (×9): qty 2

## 2020-11-23 MED ORDER — TRAMADOL HCL 50 MG PO TABS
50.0000 mg | ORAL_TABLET | Freq: Four times a day (QID) | ORAL | Status: DC | PRN
  Administered 2020-11-23 – 2020-11-30 (×17): 50 mg via ORAL
  Filled 2020-11-23 (×17): qty 1

## 2020-11-23 MED ORDER — METOPROLOL TARTRATE 12.5 MG HALF TABLET
12.5000 mg | ORAL_TABLET | Freq: Two times a day (BID) | ORAL | Status: DC
  Administered 2020-11-23 – 2020-11-26 (×7): 12.5 mg via ORAL
  Filled 2020-11-23 (×7): qty 1

## 2020-11-23 MED ORDER — IPRATROPIUM-ALBUTEROL 0.5-2.5 (3) MG/3ML IN SOLN
3.0000 mL | Freq: Four times a day (QID) | RESPIRATORY_TRACT | Status: DC
  Administered 2020-11-23 (×2): 3 mL via RESPIRATORY_TRACT
  Filled 2020-11-23 (×2): qty 3

## 2020-11-23 MED ORDER — NICOTINE 14 MG/24HR TD PT24
14.0000 mg | MEDICATED_PATCH | Freq: Every day | TRANSDERMAL | Status: DC
  Administered 2020-11-23 – 2020-12-18 (×26): 14 mg via TRANSDERMAL
  Filled 2020-11-23 (×26): qty 1

## 2020-11-23 MED ORDER — ADULT MULTIVITAMIN W/MINERALS CH
1.0000 | ORAL_TABLET | Freq: Every day | ORAL | Status: DC
  Administered 2020-11-23 – 2020-12-18 (×26): 1 via ORAL
  Filled 2020-11-23 (×26): qty 1

## 2020-11-23 MED ORDER — THIAMINE HCL 100 MG PO TABS
100.0000 mg | ORAL_TABLET | Freq: Every day | ORAL | Status: DC
  Administered 2020-11-23 – 2020-12-18 (×26): 100 mg via ORAL
  Filled 2020-11-23 (×26): qty 1

## 2020-11-23 MED ORDER — FOLIC ACID 1 MG PO TABS
1.0000 mg | ORAL_TABLET | Freq: Every day | ORAL | Status: DC
  Administered 2020-11-23 – 2020-12-18 (×26): 1 mg via ORAL
  Filled 2020-11-23 (×25): qty 1

## 2020-11-23 MED ORDER — POTASSIUM CHLORIDE CRYS ER 20 MEQ PO TBCR
40.0000 meq | EXTENDED_RELEASE_TABLET | Freq: Once | ORAL | Status: AC
Start: 2020-11-23 — End: 2020-11-23
  Administered 2020-11-23: 40 meq via ORAL
  Filled 2020-11-23: qty 2

## 2020-11-23 MED ORDER — THIAMINE HCL 100 MG/ML IJ SOLN: 100.0000 mg | Freq: Every day | INTRAMUSCULAR | Status: DC

## 2020-11-23 NOTE — TOC Progression Note (Signed)
Transition of Care [facility]) - Progression Note    Patient Details  Name: [name] MRN: [id number] Date of Birth: Jul 29, 1965  Transition of Care [facility]) CM/SW Contact  Ralene Bathe, LCSWA Phone Number: 11/23/2020, 3:35 PM  Clinical Narrative:     CSW received a call from APS, Levora Angel [phone].  Mr. Neva Seat informed CSW that the patient does not currently have a guardian, but does have an open APS case.  Mr. Neva Seat reported that he is in agreement with the OT's recommendations for SNF and if possible would request that the patient not be discharged over the weekend.  CSW inquired about the patient's insurance.  Mr. Neva Seat reported that he will check with a Medicaid caseworker or supervisor and ask if the patient has medicaid.    Pending: insurance information, SNF bed offers, continued medical workup.         Expected Discharge Plan and Services                                                 Social Determinants of Health (SDOH) Interventions    Readmission Risk Interventions Readmission Risk Prevention Plan 12/19/2019  Transportation Screening Complete  PCP or Specialist Appt within 5-7 Days Complete  Home Care Screening Complete  Medication Review (RN CM) Complete  Some recent data might be hidden

## 2020-11-23 NOTE — Progress Notes (Addendum)
Family Medicine Teaching Service Daily Progress Note Intern Pager: [phone]  Patient name: Robert Frank Medical record number: [id number] Date of birth: [birth year] Age: 55 y.o. Gender: male  Primary Care Provider: Pcp, No Consultants: None Code Status: Full  Pt Overview and Major Events to Date:  6/17 - Admitted  Assessment and Plan: Robert Frank is a 55 y.o. male presenting with right foot pain. PMH is significant for HTN, COPD, alcohol use, left eye blindness.    Right lower extremity cellulitis, sepsis S/p conservative sharp wound debridement by wound care nurse. WBC decreased from 12.4>7.6. Wound culture gram stain showed rare WBC, few gram positive cocci in pairs, rare gram positive rods. S/p Vanc x1. Patient is unable to get an MRI of his foot due to his leg contracture--can consider getting bone scan and CT with contrast to evaluate for soft tissue vs bone infection if concerned for osteomyelitis underlying.  - Continue cefepime - Continuous cardiac telemetry - Follow-up blood cultures - Follow-up wound culture - Follow-up Vas [place] ABI's - PT/OT following - Tylenol PRN    Rhabdomyolysis CK 693>323 and creatinine 0.77, which is reassuring, will continue on fluids. - NS @ 13mL/hr - Monitor CK - Monitor creatinine    Hyponatremia Na 126>127> 129. Likely chronic with alcohol use and malnutrition. - normal saline mIVF at 148mL/hr - Monitor sodium, daily    All other chronic conditions per H&P   FEN/GI: Heart healthy PPx: Subcutaneous heparin   Status is: Inpatient  Remains inpatient appropriate because:Unsafe d/c plan and IV treatments appropriate due to intensity of illness or inability to take PO  Dispo: The patient is from: Home              Anticipated d/c is to: SNF              Patient currently is not medically stable to d/c.   Difficult to place patient No     Subjective:  Patient states that he did not get his MRI done this morning. He  reports the only pain he has is in his foot but he does have a "stitch" in his side when he is coughing.   Objective: Temp:  [97.5 F (36.4 C)-99.1 F (37.3 C)] 98.4 F (36.9 C) (06/18 0446) Pulse Rate:  [94-111] 94 (06/18 0446) Resp:  [16-19] 18 (06/18 0446) BP: (105-124)/(72-91) 121/91 (06/18 0446) SpO2:  [98 %-100 %] 99 % (06/18 0446) Physical Exam: General: NAD, supine in bed, woke up suddenly startled to his name being called Cardiovascular: RRR, no murmur appreciated Respiratory: breathing comfortably on room air, no increased WOB, actively coughing during exam, loud upper airway sounds-difficult to auscultate effectively  Abdomen: soft, non-tender, non-distended Extremities: moving all equally and appropriately   Laboratory: Recent Labs  Lab 11/22/20 2220 11/23/20 0437 11/24/20 0505  WBC 15.4* 14.2* 7.6  HGB 13.0 12.7* 11.3*  HCT 38.9* 35.8* 33.4*  PLT 153 147* 149*   Recent Labs  Lab 11/22/20 2220 11/23/20 0437  NA 126* 127*  K 4.2 3.2*  CL 96* 97*  CO2 21* 21*  BUN 24* 19  CREATININE 1.24 0.94  CALCIUM 7.6* 7.5*  PROT 6.0* 5.7*  BILITOT 1.8* 1.3*  ALKPHOS 59 54  ALT 23 22  AST 46* 44*  GLUCOSE 113* 106*    Imaging/Diagnostic Tests: No results found.   Robert Leyden, DO 11/24/2020, 6:24 AM PGY-1, [facility] Family Medicine FPTS Intern pager: [phone], text pages welcome

## 2020-11-23 NOTE — Progress Notes (Addendum)
Initial Nutrition Assessment  DOCUMENTATION CODES:  Severe malnutrition in context of chronic illness, Severe malnutrition in context of social or environmental circumstances  INTERVENTION:  Add Ensure Enlive po TID, each supplement provides 350 kcal and 20 grams of protein.  Add 30 ml ProSource Plus po TID, each supplement provides 100 kcal and 15 grams of protein.   Add 1 packet Juven BID, each packet provides 95 calories, 2.5 grams of protein (collagen), and 9.8 grams of carbohydrate (3 grams sugar); also contains 7 grams of L-arginine and L-glutamine, 300 mg vitamin C, 15 mg vitamin E, 1.2 mcg vitamin B-12, 9.5 mg zinc, 200 mg calcium, and 1.5 g  Calcium Beta-hydroxy-Beta-methylbutyrate to support wound healing.  Continue CIWA protocol.  NUTRITION DIAGNOSIS:  Severe Malnutrition related to chronic illness, social / environmental circumstances as evidenced by percent weight loss, energy intake < or equal to 75% for > or equal to 1 month, mild fat depletion, mild muscle depletion.  GOAL:  Patient will meet greater than or equal to 90% of their needs  MONITOR:  PO intake, Supplement acceptance, Labs, Weight trends, Skin, I & O's  REASON FOR ASSESSMENT:  Consult, Malnutrition Screening Tool Wound healing  ASSESSMENT:  55 yo male with a PMH of EtOH use disorder, HTN, COPD, L-eye blindness, tobacco use, and CVA with residual R-sided weakness who was found down covered in bodily fluids, positive for RLE cellulitis, rhabdomyolosis, and hyponatremia.  Per OT note, "Pt is a difficult historian, APS social worker in room and providing more information about pt's prior home situation. Pt was functioning from a w/c level. His home is uninhabitable with no running water. He and his brother inherited the home when their mother died."  Spoke with pt at bedside. Pt reports that he does not eat very well at home. He reports getting one "good" meal about once per week. Pt still a little confused and  not able to elaborate much.  He reports weight loss, but is unsure of exactly how much and over how long. Per Epic, pt has lost ~71 lbs (27.8%) in the last 10.5 months, which is significant and severe for the time frame.  On exam, pt with some mild depletions.  Given above information, pt is severely malnourished in the chronic setting, as well as the social setting.  Recommend adding Ensure Enlive TID, ProSource Plus TID, Juven BID, and continuing MVI with minerals daily to promote healing.  Medications: reviewed; EE BID, folic acid, MVI with minerals, thiamine, NaCl @ 100 ml/hr per IV, cefepime TID per IV  Labs: reviewed; Na 127, K 3.2, Glucose 106, HIV negative  NUTRITION - FOCUSED PHYSICAL EXAM: Flowsheet Row Most Recent Value  Orbital Region Mild depletion  Upper Arm Region No depletion  Thoracic and Lumbar Region No depletion  Buccal Region Mild depletion  Temple Region Mild depletion  Clavicle Bone Region Mild depletion  Clavicle and Acromion Bone Region Mild depletion  Scapular Bone Region Unable to assess  Dorsal Hand Mild depletion  Patellar Region No depletion  Anterior Thigh Region No depletion  Posterior Calf Region No depletion  Edema (RD Assessment) Moderate  [RLE]  Hair Reviewed  Eyes Reviewed  Mouth Reviewed  Skin Reviewed  Nails Reviewed   Diet Order:   Diet Order             Diet Heart Room service appropriate? Yes with Assist; Fluid consistency: Thin  Diet effective now  EDUCATION NEEDS:  Education needs have been addressed  Skin:  Skin Assessment: Skin Integrity Issues: Skin Integrity Issues:: Other (Comment) Other: R foot cellulitis, open  Last BM:  PTA/unknown  Height:  Ht Readings from Last 1 Encounters:  11/23/20 5\' 6"  (1.676 m)   Weight:  Wt Readings from Last 1 Encounters:  11/23/20 84.1 kg   Ideal Body Weight:  64.5 kg  BMI:  Body mass index is 29.93 kg/m.  Estimated Nutritional Needs:  Kcal:   2300-2500 Protein:  130-145 grams Fluid:  >2 L  11/25/20, RD, LDN Registered Dietitian I After-Hours/Weekend Pager # in [place]

## 2020-11-23 NOTE — Progress Notes (Signed)
FPTS Interim Progress Note  S: Patient evaluated at bedside this morning.  He complains of right foot pain but denies any other pain.  O: BP 105/72   Pulse (!) 103   Temp (!) 97.5 F (36.4 C) (Oral)   Resp 16   Ht 5\' 6"  (1.676 m)   Wt 185 lb 6.5 oz (84.1 kg)   SpO2 100%   BMI 29.93 kg/m     A/P: R lower extremity cellulitis I Sepsis Afebrile, WBC's trending down 15.4>14.2, Lipase 65, CRP 22.3.  Wound consult was placed and they did conservative sharp wound debridement and signed off. --Cefepime x 7 days(6/16--) --f/u Blood culture --f/u Wound culture --f/u MRI --f/u ABI and TBI  Rhabdomyolysis 818>693 --mIVF 100 ml/hr  Hypokalemia 3.2 this am --Kcl 40 meq --BMP in am   Hyponatremia Na+127>126 --Monitor closely --BMP in am  Protein-calorie malnutrition Albumin 2.2 --Nutritional consult  Asymptomatic Cholelithiasis No abdominal pain, incidental finding on ultrasound abdomen --Monitor for any changes  Hx of alcohol use --CIWA without Ativan  [id number], MD 11/23/2020, 1:22 PM PGY-1, [facility] Family Medicine Service pager [phone]

## 2020-11-23 NOTE — Progress Notes (Signed)
Called MRI, and per MRI staff  they dont have a timetable yet, but its gonna be today.

## 2020-11-23 NOTE — Progress Notes (Addendum)
Pharmacy Antibiotic Note  Robert Frank is a 55 y.o. male admitted on 11/22/2020 with  cellulitis infested with maggots possible OM pending MRI.  Pt received first doses of Vanc and Cefepime in the ED. Pharmacy has been consulted for Cefepime dosing.  Assessment: Pt has a cellulitis infested with maggots possible OM pending MRI. Awaiting culture and MRI. Currently afebrile.(97.9) WBC remains elevated. (14.2) Lactic Acid elevated (1.7) Xray negative for OM.   Plan: Cefepime 2g Q8h,  Trend Temp, WBC, Renal Function, clinical progress F/U on culture and adjust therapy as needed. F/U length and duration of therapy F/U MRI of foot   Height: 5\' 6"  (167.6 cm) Weight: 84.1 kg (185 lb 6.5 oz) IBW/kg (Calculated) : 63.8  Temp (24hrs), Avg:97.9 F (36.6 C), Min:97.5 F (36.4 C), Max:98.5 F (36.9 C)  Recent Labs  Lab 11/22/20 2220 11/23/20 0437  WBC 15.4* 14.2*  CREATININE 1.24 0.94  LATICACIDVEN 1.7  --     Estimated Creatinine Clearance: 92.4 mL/min (by C-G formula based on SCr of 0.94 mg/dL).    No Known Allergies  Antimicrobials this admission: Vancomycin IV 1500 mg 6/16 x1 Cefepime 2g  >> 6/16-  Dose adjustments this admission: None  Microbiology results: BCx: pending (Deep) Wound Cx: pending   Thank you for allowing pharmacy to be a part of this patient's care.  7/16, PharmD Candidate 11/23/2020 12:18 PM

## 2020-11-23 NOTE — Evaluation (Signed)
Physical Therapy Evaluation Patient Details Name: Robert Frank MRN: [id number] DOB: 08/14/1965 Today's Date: 11/23/2020   History of Present Illness  Pt is a 55 year old man admitted on 11/22/20 after being found down covered in bodily fluids. + R LE cellulitis, sepsis, rhabdomyolosis, hyponatremia. PMH: alcohol use disorder, HTN, COPD, L eye blindness, tobacco use, CVA with residual R side weakness.   Clinical Impression  Pt presents with condition above and deficits mentioned below, see PT Problem List. PTA, pt was functioning from a w/c level, recently needing increased assistance to perform squat pivot transfers. Per chart, his home is uninhabitable with no running water. He and his brother inherited the home when their mother died. Currently, pt demonstrates a R knee flexion contracture, with him lacking ~30 degrees of AAROM knee extension, and he demonstrates decreased bil ankle dorsiflexion ROM. These ROM deficits along with his generalized weakness impact his safety and independence with standing and performing transfers or taking steps. Pt was unable to tolerate his R lower extremity pain enough to attempt transfers this date. Pt needing modA for bed mobility with the HOB elevated. Pt is at high risk for falls. Pt would benefit from receiving rehab at a SNF to increase his independence and safety with all functional mobility and assist in managing his wound. Will continue to follow acutely.        Follow Up Recommendations SNF;Supervision/Assistance - 24 hour    Equipment Recommendations  Other (comment) (defer to next venue of care)    Recommendations for Other Services       Precautions / Restrictions Precautions Precautions: Fall Precaution Comments: HOH; Contact precautions Restrictions Weight Bearing Restrictions: No      Mobility  Bed Mobility Overal bed mobility: Needs Assistance Bed Mobility: Supine to Sit;Sit to Supine     Supine to sit: HOB elevated;Mod  assist Sit to supine: Mod assist;HOB elevated   General bed mobility comments: HOB elevated, cuing pt to manage legs towards L EOB, pt initiating but needing physical assistance to complete legs off bed. ModA to manage legs and ascend trunk, cuing pt to reach for L bed rail but pt pulling with R hand on therapist's hand. ModA to manage trunk and legs with return to supine.    Transfers                 General transfer comment: deferred, pt unable to tolerate due to R LE pain  Ambulation/Gait             General Gait Details: deferred, pt unable to tolerate due to R LE pain  Stairs            Wheelchair Mobility    Modified Rankin (Stroke Patients Only)       Balance Overall balance assessment: Needs assistance Sitting-balance support: No upper extremity supported;Feet unsupported Sitting balance-Leahy Scale: Fair Sitting balance - Comments: Supervision sitting EOB statically, no LOB.       Standing balance comment: deferred, pt unable to tolerate due to R LE pain                             Pertinent Vitals/Pain Pain Assessment: Faces Faces Pain Scale: Hurts whole lot Pain Location: R LE Pain Descriptors / Indicators: Discomfort;Grimacing;Guarding Pain Intervention(s): Limited activity within patient's tolerance;Monitored during session;Repositioned    Home Living Family/patient expects to be discharged to:: Skilled nursing facility  Additional Comments: per chart, pt was living in squalor, pt's home does not have running water    Prior Function Level of Independence: Needs assistance   Gait / Transfers Assistance Needed: Pt reports he used to take steps to transfer but recently has been needing increased assistance to pivot to his w/c, sometimes calling the Fire Department to assist him. Pt sleeps in a love seat and reports he scoots off the chair onto a cushion on the ground to then pull himself outside. He states  his w/c stays outside.     Comments: reports being w/c dependent, able to transfer himself (needing more assistance recently), dress, self feed, no running water for bathing     Hand Dominance   Dominant Hand: Right    Extremity/Trunk Assessment   Upper Extremity Assessment Upper Extremity Assessment: Defer to OT evaluation    Lower Extremity Assessment Lower Extremity Assessment: RLE deficits/detail;Generalized weakness (bil ankle dorsiflexion ROM limited) RLE Deficits / Details: knee extension AAROM = -30 degrees (measured visually); ACE wrap around wound at lower leg; edema and erythema noted RLE Sensation:  (denies numbness/tingling, able to detect location of touch with precision when eyes closed) RLE Coordination: decreased gross motor;decreased fine motor    Cervical / Trunk Assessment Cervical / Trunk Assessment: Kyphotic  Communication   Communication: HOH  Cognition Arousal/Alertness: Awake/alert Behavior During Therapy: Flat affect Overall Cognitive Status: Impaired/Different from baseline Area of Impairment: Memory;Following commands;Problem solving;Awareness;Safety/judgement                     Memory: Decreased short-term memory Following Commands: Follows one step commands with increased time Safety/Judgement: Decreased awareness of safety;Decreased awareness of deficits Awareness: Emergent Problem Solving: Slow processing;Decreased initiation;Requires verbal cues;Difficulty sequencing General Comments: Unsure of pt's baseline. Pt needing increased time to process cues, possibly due to being [facility]. Needs simple commands to sequence tasks also.      General Comments General comments (skin integrity, edema, etc.): End of session, positioned R lower leg (aspect superior to wound) on pillow and blanket pile to encourage knee extension stretch and heel and wound floating/pressure relief; educated pt to perform quad sets, ankle pumps, and SLR    Exercises      Assessment/Plan    PT Assessment Patient needs continued PT services  PT Problem List Decreased strength;Decreased range of motion;Decreased activity tolerance;Decreased balance;Decreased mobility;Decreased coordination;Decreased cognition;Decreased knowledge of use of DME;Decreased safety awareness;Decreased skin integrity;Pain       PT Treatment Interventions DME instruction;Gait training;Functional mobility training;Therapeutic activities;Therapeutic exercise;Balance training;Neuromuscular re-education;Cognitive remediation;Patient/family education;Wheelchair mobility training    PT Goals (Current goals can be found in the Care Plan section)  Acute Rehab PT Goals Patient Stated Goal: to go to SNF for rehab and to heal wound PT Goal Formulation: With patient Time For Goal Achievement: 12/07/20 Potential to Achieve Goals: Fair    Frequency Min 2X/week   Barriers to discharge Decreased caregiver support;Inaccessible home environment per chart, pt was living in squalor, pt's home does not have running water    Co-evaluation               AM-PAC PT "6 Clicks" Mobility  Outcome Measure Help needed turning from your back to your side while in a flat bed without using bedrails?: A Lot Help needed moving from lying on your back to sitting on the side of a flat bed without using bedrails?: A Lot Help needed moving to and from a bed to a chair (including a wheelchair)?: Total  Help needed standing up from a chair using your arms (e.g., wheelchair or bedside chair)?: Total Help needed to walk in hospital room?: Total Help needed climbing 3-5 steps with a railing? : Total 6 Click Score: 8    End of Session   Activity Tolerance: Patient limited by pain Patient left: in bed;with call bell/phone within reach;with bed alarm set Nurse Communication: Mobility status PT Visit Diagnosis: Muscle weakness (generalized) (M62.81);Difficulty in walking, not elsewhere classified  (R26.2);Pain Pain - Right/Left: Right Pain - part of body: Ankle and joints of foot;Leg    Time: 1600-1620 PT Time Calculation (min) (ACUTE ONLY): 20 min   Charges:   PT Evaluation $PT Eval Moderate Complexity: 1 Mod          Raymond Gurney, PT, DPT Acute Rehabilitation Services  Pager: [phone] Office: [phone]   Jewel Baize 11/23/2020, 4:38 PM

## 2020-11-23 NOTE — Evaluation (Signed)
Occupational Therapy Evaluation Patient Details Name: Robert Frank MRN: [id number] DOB: Apr 07, 1966 Today's Date: 11/23/2020    History of Present Illness Pt is a 55 year old man admitted on 11/22/20 after being found down covered in bodily fluids. + R LE cellulitis, sepsis, rhabdomyolosis, hyponatremia. PMH: alcohol use disorder, HTN, COPD, L eye blindness, tobacco use, CVA with residual R side weakness.   Clinical Impression   Pt is a difficult historian, APS social worker in room and providing more information about pt's prior home situation. Pt was functioning from a w/c level. His home is uninhabitable with no running water. He and his brother inherited the home when their mother died. Pt presents with R LE pain, he was unable to tolerate sitting EOB. Completed bed level grooming and eating with set up and changed gown with moderate assistance. Pt positioned upright in bed for drinking coffee safely. He tends to keep his R LE externally rotated with knee flexion as position of comfort. Per APS worker, pt has Medicaid. Recommending SNF upon discharge, pt is currently agreeable.    Follow Up Recommendations  SNF;Supervision/Assistance - 24 hour    Equipment Recommendations  None recommended by OT    Recommendations for Other Services       Precautions / Restrictions Precautions Precautions: Fall Restrictions Weight Bearing Restrictions: No      Mobility Bed Mobility Overal bed mobility: Needs Assistance             General bed mobility comments: min assist to pull up in bed with bed in trendelenberg    Transfers                 General transfer comment: deferred, pt unable to tolerate due to R LE pain    Balance Overall balance assessment: Needs assistance Sitting-balance support: Bilateral upper extremity supported Sitting balance-Leahy Scale: Fair Sitting balance - Comments: tolerated only momentarily                                    ADL either performed or assessed with clinical judgement   ADL Overall ADL's : Needs assistance/impaired Eating/Feeding: Set up;Bed level   Grooming: Wash/dry hands;Wash/dry face;Bed level;Set up   Upper Body Bathing: Moderate assistance;Bed level   Lower Body Bathing: Total assistance;Bed level   Upper Body Dressing : Moderate assistance;Bed level   Lower Body Dressing: Total assistance;Bed level                       Vision Baseline Vision/History: Legally blind (L eye) Patient Visual Report: No change from baseline       Perception     Praxis      Pertinent Vitals/Pain Pain Assessment: Faces Faces Pain Scale: Hurts whole lot Pain Location: R LE Pain Descriptors / Indicators: Discomfort;Grimacing;Guarding Pain Intervention(s): Monitored during session;Repositioned;Patient requesting pain meds-RN notified     Hand Dominance Right (leads with L with coffee)   Extremity/Trunk Assessment Upper Extremity Assessment Upper Extremity Assessment: RUE deficits/detail RUE Deficits / Details: residual weakness from prior CVA   Lower Extremity Assessment Lower Extremity Assessment: Defer to PT evaluation (knee contracture on R)       Communication Communication Communication: HOH   Cognition Arousal/Alertness: Awake/alert Behavior During Therapy: Flat affect Overall Cognitive Status: Impaired/Different from baseline Area of Impairment: Orientation;Memory;Following commands;Problem solving;Awareness;Safety/judgement  Orientation Level: Disoriented to;Time   Memory: Decreased short-term memory Following Commands: Follows one step commands with increased time Safety/Judgement: Decreased awareness of safety;Decreased awareness of deficits Awareness: Intellectual Problem Solving: Slow processing;Decreased initiation;Requires verbal cues     General Comments       Exercises     Shoulder Instructions      Home Living  Family/patient expects to be discharged to:: Skilled nursing facility                                 Additional Comments: per chart, pt was living in squalor, pt's home does not have running water      Prior Functioning/Environment Level of Independence: Independent with assistive device(s)        Comments: reports being w/c dependent, able to transfer himself, dress, self feed, no running water for bathing        OT Problem List: Decreased strength;Impaired balance (sitting and/or standing);Pain;Decreased cognition      OT Treatment/Interventions: Self-care/ADL training;DME and/or AE instruction;Patient/family education;Balance training;Therapeutic activities    OT Goals(Current goals can be found in the care plan section) Acute Rehab OT Goals Patient Stated Goal: agreed to going to SNF for rehab and to heal up R LE OT Goal Formulation: With patient Time For Goal Achievement: 12/07/20 Potential to Achieve Goals: Fair ADL Goals Pt Will Perform Grooming: with set-up;sitting (at sink) Pt Will Perform Upper Body Dressing: with supervision;sitting Pt Will Perform Lower Body Dressing: with min assist;sitting/lateral leans Pt Will Transfer to Toilet: with min assist;with transfer board;bedside commode Pt Will Perform Toileting - Clothing Manipulation and hygiene: with supervision;sitting/lateral leans Additional ADL Goal #1: Pt will tolerate sitting EOB x 10 minutes as a precursor to ADL.  OT Frequency: Min 2X/week   Barriers to D/C: Inaccessible home environment;Decreased caregiver support          Co-evaluation              AM-PAC OT "6 Clicks" Daily Activity     Outcome Measure Help from another person eating meals?: None Help from another person taking care of personal grooming?: A Little Help from another person toileting, which includes using toliet, bedpan, or urinal?: Total Help from another person bathing (including washing, rinsing, drying)?: A  Lot Help from another person to put on and taking off regular upper body clothing?: A Lot Help from another person to put on and taking off regular lower body clothing?: Total 6 Click Score: 13   End of Session Nurse Communication: Patient requests pain meds  Activity Tolerance: Patient limited by pain Patient left: in bed;with call bell/phone within reach;with bed alarm set  OT Visit Diagnosis: Muscle weakness (generalized) (M62.81);Other symptoms and signs involving cognitive function                Time: 3734-2876 OT Time Calculation (min): 24 min Charges:  OT General Charges $OT Visit: 1 Visit OT Evaluation $OT Eval Moderate Complexity: 1 Mod OT Treatments $Self Care/Home Management : 8-22 mins  Martie Round, OTR/L Acute Rehabilitation Services Pager: [phone] Office: [phone]  Evern Bio 11/23/2020, 11:45 AM

## 2020-11-23 NOTE — Consult Note (Signed)
WOC Nurse Consult Note: Patient receiving care in [facility] 5N27. Reason for Consult: lower extremity wound Wound type: unclear etiology of right foot wounds and RLE cellulitis. See photos. Maggots were present on arrival to ED. Pressure Injury POA: Yes/No/NA Measurement: na Wound bed: photos represent condition of right foot and leg Drainage (amount, consistency, odor)  Periwound: edematous, erythematous Dressing procedure/placement/frequency: Conservative sharp wound debridement (CSWD performed at the bedside): Apply bacitracin to right foot wounds, cover with vaseline or Xeroform gauze, wrap in kerlex. Perform twice daily. WOC nurse will not follow at this time.  Please re-consult the WOC team if needed.  Helmut Muster, RN, MSN, CWOCN, CNS-BC, pager [phone]

## 2020-11-24 ENCOUNTER — Inpatient Hospital Stay (HOSPITAL_COMMUNITY): Payer: Self-pay

## 2020-11-24 ENCOUNTER — Encounter (HOSPITAL_COMMUNITY): Payer: Self-pay | Admitting: Family Medicine

## 2020-11-24 DIAGNOSIS — B879 Myiasis, unspecified: Secondary | ICD-10-CM | POA: Diagnosis present

## 2020-11-24 LAB — COMPREHENSIVE METABOLIC PANEL
ALT: 32 U/L (ref 0–44)
AST: 62 U/L — ABNORMAL HIGH (ref 15–41)
Albumin: 1.9 g/dL — ABNORMAL LOW (ref 3.5–5.0)
Alkaline Phosphatase: 51 U/L (ref 38–126)
Anion gap: 6 (ref 5–15)
BUN: 10 mg/dL (ref 6–20)
CO2: 25 mmol/L (ref 22–32)
Calcium: 7.7 mg/dL — ABNORMAL LOW (ref 8.9–10.3)
Chloride: 98 mmol/L (ref 98–111)
Creatinine, Ser: 0.77 mg/dL (ref 0.61–1.24)
GFR, Estimated: >60 mL/min (ref >60)
Glucose, Bld: 105 mg/dL — ABNORMAL HIGH (ref 70–99)
Potassium: 3.5 mmol/L (ref 3.5–5.1)
Sodium: 129 mmol/L — ABNORMAL LOW (ref 135–145)
Total Bilirubin: 0.7 mg/dL (ref 0.3–1.2)
Total Protein: 5.5 g/dL — ABNORMAL LOW (ref 6.5–8.1)

## 2020-11-24 LAB — HEMOGLOBIN A1C
Hgb A1c MFr Bld: 4.9 % (ref 4.8–5.6)
Mean Plasma Glucose: 94 mg/dL

## 2020-11-24 LAB — CBC
HCT: 33.4 % — ABNORMAL LOW (ref 39.0–52.0)
Hemoglobin: 11.3 g/dL — ABNORMAL LOW (ref 13.0–17.0)
MCH: 33.9 pg (ref 26.0–34.0)
MCHC: 33.8 g/dL (ref 30.0–36.0)
MCV: 100.3 fL — ABNORMAL HIGH (ref 80.0–100.0)
Platelets: 149 10*3/uL — ABNORMAL LOW (ref 150–400)
RBC: 3.33 MIL/uL — ABNORMAL LOW (ref 4.22–5.81)
RDW: 13.8 % (ref 11.5–15.5)
WBC: 7.6 10*3/uL (ref 4.0–10.5)
nRBC: 0 % (ref 0.0–0.2)

## 2020-11-24 LAB — CK: Total CK: 323 U/L (ref 49–397)

## 2020-11-24 LAB — CALCIUM, IONIZED: Calcium, Ionized, Serum: 4.1 mg/dL — ABNORMAL LOW (ref 4.5–5.6)

## 2020-11-24 MED ORDER — ENSURE ENLIVE PO LIQD
237.0000 mL | Freq: Three times a day (TID) | ORAL | Status: DC
  Administered 2020-11-24 – 2020-12-18 (×58): 237 mL via ORAL

## 2020-11-24 MED ORDER — IOHEXOL 300 MG/ML  SOLN
75.0000 mL | Freq: Once | INTRAMUSCULAR | Status: AC | PRN
  Administered 2020-11-24: 75 mL via INTRAVENOUS

## 2020-11-24 NOTE — Plan of Care (Signed)

## 2020-11-24 NOTE — Progress Notes (Signed)
HR rechecked, charge nurse aware.  11/23/20 2007  Assess: MEWS Score  Temp 99.1 F (37.3 C)  BP 110/84  Pulse Rate (!) 111  Resp 19  Level of Consciousness Alert  SpO2 100 %  O2 Device Room Air  Assess: MEWS Score  MEWS Temp 0  MEWS Systolic 0  MEWS Pulse 2  MEWS RR 0  MEWS LOC 0  MEWS Score 2  MEWS Score Color Yellow  Treat  Pain Scale 0-10  Pain Score 8  Pain Type Acute pain  Pain Location Leg  Pain Orientation Right  Pain Descriptors / Indicators Aching;Burning  Pain Frequency Intermittent  Pain Onset On-going  Pain Intervention(s) Medication (See eMAR)  Escalate  MEWS: Escalate Yellow: discuss with charge nurse/RN and consider discussing with provider and RRT  Notify: Charge Nurse/RN  Name of Charge Nurse/RN Notified Alona Bene, RN  Date Charge Nurse/RN Notified 11/23/20  Time Charge Nurse/RN Notified 2010

## 2020-11-25 ENCOUNTER — Encounter (HOSPITAL_COMMUNITY): Payer: Self-pay | Admitting: Family Medicine

## 2020-11-25 DIAGNOSIS — M2011 Hallux valgus (acquired), right foot: Secondary | ICD-10-CM

## 2020-11-25 DIAGNOSIS — B879 Myiasis, unspecified: Secondary | ICD-10-CM

## 2020-11-25 DIAGNOSIS — Z5900 Homelessness unspecified: Secondary | ICD-10-CM

## 2020-11-25 DIAGNOSIS — E860 Dehydration: Secondary | ICD-10-CM

## 2020-11-25 HISTORY — DX: Hallux valgus (acquired), right foot: M20.11

## 2020-11-25 LAB — BASIC METABOLIC PANEL
Anion gap: 5 (ref 5–15)
BUN: 9 mg/dL (ref 6–20)
CO2: 24 mmol/L (ref 22–32)
Calcium: 8 mg/dL — ABNORMAL LOW (ref 8.9–10.3)
Chloride: 101 mmol/L (ref 98–111)
Creatinine, Ser: 0.69 mg/dL (ref 0.61–1.24)
GFR, Estimated: >60 mL/min (ref >60)
Glucose, Bld: 95 mg/dL (ref 70–99)
Potassium: 3.8 mmol/L (ref 3.5–5.1)
Sodium: 130 mmol/L — ABNORMAL LOW (ref 135–145)

## 2020-11-25 LAB — CBC
HCT: 35.7 % — ABNORMAL LOW (ref 39.0–52.0)
Hemoglobin: 11.9 g/dL — ABNORMAL LOW (ref 13.0–17.0)
MCH: 33.5 pg (ref 26.0–34.0)
MCHC: 33.3 g/dL (ref 30.0–36.0)
MCV: 100.6 fL — ABNORMAL HIGH (ref 80.0–100.0)
Platelets: 192 10*3/uL (ref 150–400)
RBC: 3.55 MIL/uL — ABNORMAL LOW (ref 4.22–5.81)
RDW: 13.5 % (ref 11.5–15.5)
WBC: 6.2 10*3/uL (ref 4.0–10.5)
nRBC: 0 % (ref 0.0–0.2)

## 2020-11-25 LAB — HEPATIC FUNCTION PANEL
ALT: 41 U/L (ref 0–44)
AST: 71 U/L — ABNORMAL HIGH (ref 15–41)
Albumin: 2 g/dL — ABNORMAL LOW (ref 3.5–5.0)
Alkaline Phosphatase: 45 U/L (ref 38–126)
Bilirubin, Direct: 0.2 mg/dL (ref 0.0–0.2)
Indirect Bilirubin: 0.2 mg/dL — ABNORMAL LOW (ref 0.3–0.9)
Total Bilirubin: 0.4 mg/dL (ref 0.3–1.2)
Total Protein: 5.8 g/dL — ABNORMAL LOW (ref 6.5–8.1)

## 2020-11-25 LAB — CK: Total CK: 221 U/L (ref 49–397)

## 2020-11-25 MED ORDER — ENOXAPARIN SODIUM 40 MG/0.4ML IJ SOSY
40.0000 mg | PREFILLED_SYRINGE | INTRAMUSCULAR | Status: DC
  Administered 2020-11-25 – 2020-12-18 (×24): 40 mg via SUBCUTANEOUS
  Filled 2020-11-25 (×23): qty 0.4

## 2020-11-25 NOTE — Progress Notes (Signed)
Family Medicine Teaching Service Daily Progress Note Intern Pager: [phone]  Patient name: Robert Frank Medical record number: [id number] Date of birth: [birth year] Age: 55 y.o. Gender: male  Primary Care Provider: Pcp, No Consultants: Wound care Code Status: Full  Pt Overview and Major Events to Date:  11/23/2020: Admitted to FPTS    Assessment and Plan: Montee Tallman is a 55 year old male presenting with right foot pain.  PMH is significant for HTN, COPD, alcohol use, left eye blindness  R lower extremity cellulitis, Sepsis I infestation by maggots Patient complains of 7/10 pain in his right foot and knee. S/p conservative sharp wound debridement by wound care nurse.  No leukocytosis, wound culture gram stain showed rare WBC, few gram-positive cocci in pairs, rare gram-positive rods, s/p Vanc x 1. CT right ankle & Foot with contrast does not show any evidence of osteomyelitis but mild circumferential subcutaneous edema of right lower extremity, no organized or drainable fluid collection, no soft tissue gas but severe hallux valgus deformity.  --c/w Cefepime x 7days (6/16-6/22) --Re-consulted Wound care --f/u Blood culture --f/u Wound cultures --f/u ABI to assess arterial circulation --Tylenol and Tramadol PRN --c/w monitor for emergence of larva with time  Protein calorie malnutrition I Generalized weakness PT suggested SNF.  Nutritional suggested adding Ensure Enlive, Prosource, Juven. --Appreciate management assistance --c/w PT/OT eval and treat   Homelessness S Worker helping to sort out Medicaid and placement for this patient --Appreciate SW assistance  Rhabdomyolysis-Resolved I Dehydration CK 818>693>323>221 --mIVF @100     FEN/GI: Heart healthy diet PPx: Lovenox   Status is: Inpatient  Remains inpatient appropriate because:Ongoing active pain requiring inpatient pain management  Dispo: The patient is from:  Homeless              Anticipated d/c is to:  SNF              Patient currently is not medically stable to d/c.   Difficult to place patient No        Subjective:  No acute overnight events. Evaluated at bedside this morning and complains of right knee and foot pain.  RN notified to give patient tramadol.  Objective: Temp:  [97.6 F (36.4 C)-98.3 F (36.8 C)] 97.6 F (36.4 C) (06/19 0900) Pulse Rate:  [84-93] 84 (06/19 0900) Resp:  [17-18] 17 (06/19 0900) BP: (95-128)/(50-92) 128/92 (06/19 0900) SpO2:  [99 %-100 %] 100 % (06/19 0900) Physical Exam: General: Pleasant male lying comfortably in bed, NAD Cardiovascular: Regular Rate and rhythm Respiratory: Breathing comfortably on room air, no wheezing appreciated and clear to auscultation bilaterally.  Dry cough present but improving Abdomen: soft, Tender, nondistended Extremities: Able to move all extremities but hesitant to move right foot  Laboratory: Recent Labs  Lab 11/23/20 0437 11/24/20 0505 11/25/20 0106  WBC 14.2* 7.6 6.2  HGB 12.7* 11.3* 11.9*  HCT 35.8* 33.4* 35.7*  PLT 147* 149* 192   Recent Labs  Lab 11/23/20 0437 11/24/20 0505 11/25/20 0106  NA 127* 129* 130*  K 3.2* 3.5 3.8  CL 97* 98 101  CO2 21* 25 24  BUN 19 10 9   CREATININE 0.94 0.77 0.69  CALCIUM 7.5* 7.7* 8.0*  PROT 5.7* 5.5* 5.8*  BILITOT 1.3* 0.7 0.4  ALKPHOS 54 51 45  ALT 22 32 41  AST 44* 62* 71*  GLUCOSE 106* 105* 95    Imaging/Diagnostic Tests: CT ANKLE RIGHT W CONTRAST  Result Date: 11/24/2020 CLINICAL DATA:  Right foot and ankle swelling. Cellulitis. Clinical concern  for osteomyelitis. EXAM: CT OF THE RIGHT ANKLE WITH CONTRAST CT OF THE RIGHT FOOT WITH CONTRAST TECHNIQUE: Multidetector CT imaging of the right ankle was performed following the standard protocol during bolus administration of intravenous contrast. CONTRAST:  20mL OMNIPAQUE IOHEXOL 300 MG/ML  SOLN COMPARISON:  X-ray 11/23/2020. FINDINGS: RIGHT ANKLE: Bones/Joint/Cartilage No acute fracture. No dislocation.  No cortical destruction or periostitis is seen. Ankle mortise is congruent with preservation of the tibiotalar joint space. No tibiotalar joint effusion. Os trigonum noted. Ligaments Suboptimally assessed by CT. Muscles and Tendons Fatty infiltration of the lower leg musculature. Tendinous structures appear grossly intact. No tenosynovial fluid collections are evident. Soft tissues Mild circumferential subcutaneous edema, nonspecific. No organized or drainable fluid collections. No soft tissue gas. RIGHT FOOT: Bones/Joint/Cartilage No acute fracture. Severe hallux valgus deformity with lateral subluxation of the great toe proximal phalanx. Hallux sesamoids are laterally subluxed. No cortical destruction or periostitis is seen. Ligaments Suboptimally assessed by CT. Muscles and Tendons Fatty infiltration of the foot musculature. Tendinous structures appear grossly intact. No tenosynovial fluid collections are evident. Soft tissues Mild circumferential subcutaneous edema, nonspecific. No organized or drainable fluid collections. No soft tissue gas. IMPRESSION: 1. No CT evidence of osteomyelitis of the right foot or ankle. 2. Mild circumferential subcutaneous edema of the right lower extremity, nonspecific. No organized or drainable fluid collections. No soft tissue gas. 3. Severe hallux valgus deformity. Electronically Signed   By: Duanne Guess D.O.   On: 11/24/2020 19:08   CT FOOT RIGHT W CONTRAST  Result Date: 11/24/2020 CLINICAL DATA:  Right foot and ankle swelling. Cellulitis. Clinical concern for osteomyelitis. EXAM: CT OF THE RIGHT ANKLE WITH CONTRAST CT OF THE RIGHT FOOT WITH CONTRAST TECHNIQUE: Multidetector CT imaging of the right ankle was performed following the standard protocol during bolus administration of intravenous contrast. CONTRAST:  73mL OMNIPAQUE IOHEXOL 300 MG/ML  SOLN COMPARISON:  X-ray 11/23/2020. FINDINGS: RIGHT ANKLE: Bones/Joint/Cartilage No acute fracture. No dislocation. No  cortical destruction or periostitis is seen. Ankle mortise is congruent with preservation of the tibiotalar joint space. No tibiotalar joint effusion. Os trigonum noted. Ligaments Suboptimally assessed by CT. Muscles and Tendons Fatty infiltration of the lower leg musculature. Tendinous structures appear grossly intact. No tenosynovial fluid collections are evident. Soft tissues Mild circumferential subcutaneous edema, nonspecific. No organized or drainable fluid collections. No soft tissue gas. RIGHT FOOT: Bones/Joint/Cartilage No acute fracture. Severe hallux valgus deformity with lateral subluxation of the great toe proximal phalanx. Hallux sesamoids are laterally subluxed. No cortical destruction or periostitis is seen. Ligaments Suboptimally assessed by CT. Muscles and Tendons Fatty infiltration of the foot musculature. Tendinous structures appear grossly intact. No tenosynovial fluid collections are evident. Soft tissues Mild circumferential subcutaneous edema, nonspecific. No organized or drainable fluid collections. No soft tissue gas. IMPRESSION: 1. No CT evidence of osteomyelitis of the right foot or ankle. 2. Mild circumferential subcutaneous edema of the right lower extremity, nonspecific. No organized or drainable fluid collections. No soft tissue gas. 3. Severe hallux valgus deformity. Electronically Signed   By: Duanne Guess D.O.   On: 11/24/2020 19:08     Susumu Hackler, [name], MD 11/25/2020, 11:10 AM PGY-1, [facility] FPTS Intern pager: [phone], text pages welcome

## 2020-11-25 NOTE — Plan of Care (Signed)

## 2020-11-26 ENCOUNTER — Inpatient Hospital Stay (HOSPITAL_COMMUNITY): Payer: Self-pay

## 2020-11-26 DIAGNOSIS — E538 Deficiency of other specified B group vitamins: Secondary | ICD-10-CM

## 2020-11-26 DIAGNOSIS — L0390 Cellulitis, unspecified: Secondary | ICD-10-CM

## 2020-11-26 LAB — AEROBIC CULTURE W GRAM STAIN (SUPERFICIAL SPECIMEN)

## 2020-11-26 LAB — CBC
HCT: 34.8 % — ABNORMAL LOW (ref 39.0–52.0)
Hemoglobin: 11.6 g/dL — ABNORMAL LOW (ref 13.0–17.0)
MCH: 33.2 pg (ref 26.0–34.0)
MCHC: 33.3 g/dL (ref 30.0–36.0)
MCV: 99.7 fL (ref 80.0–100.0)
Platelets: 249 10*3/uL (ref 150–400)
RBC: 3.49 MIL/uL — ABNORMAL LOW (ref 4.22–5.81)
RDW: 13.4 % (ref 11.5–15.5)
WBC: 5.3 10*3/uL (ref 4.0–10.5)
nRBC: 0 % (ref 0.0–0.2)

## 2020-11-26 LAB — BASIC METABOLIC PANEL
Anion gap: 6 (ref 5–15)
BUN: 7 mg/dL (ref 6–20)
CO2: 25 mmol/L (ref 22–32)
Calcium: 7.9 mg/dL — ABNORMAL LOW (ref 8.9–10.3)
Chloride: 99 mmol/L (ref 98–111)
Creatinine, Ser: 0.65 mg/dL (ref 0.61–1.24)
GFR, Estimated: >60 mL/min (ref >60)
Glucose, Bld: 108 mg/dL — ABNORMAL HIGH (ref 70–99)
Potassium: 3.5 mmol/L (ref 3.5–5.1)
Sodium: 130 mmol/L — ABNORMAL LOW (ref 135–145)

## 2020-11-26 LAB — HEPATIC FUNCTION PANEL
ALT: 30 U/L (ref 0–44)
AST: 36 U/L (ref 15–41)
Albumin: 1.8 g/dL — ABNORMAL LOW (ref 3.5–5.0)
Alkaline Phosphatase: 43 U/L (ref 38–126)
Bilirubin, Direct: 0.2 mg/dL (ref 0.0–0.2)
Indirect Bilirubin: 0.4 mg/dL (ref 0.3–0.9)
Total Bilirubin: 0.6 mg/dL (ref 0.3–1.2)
Total Protein: 5.5 g/dL — ABNORMAL LOW (ref 6.5–8.1)

## 2020-11-26 LAB — VITAMIN B12: Vitamin B-12: 226 pg/mL (ref 180–914)

## 2020-11-26 LAB — FOLATE: Folate: 10.5 ng/mL (ref >5.9)

## 2020-11-26 MED ORDER — VANCOMYCIN HCL 1500 MG/300ML IV SOLN
1500.0000 mg | Freq: Two times a day (BID) | INTRAVENOUS | Status: DC
  Administered 2020-11-27: 1500 mg via INTRAVENOUS
  Filled 2020-11-26: qty 300

## 2020-11-26 MED ORDER — GUAIFENESIN 100 MG/5ML PO SOLN: 5.0000 mL | Freq: Four times a day (QID) | ORAL | Status: DC | PRN

## 2020-11-26 MED ORDER — CYANOCOBALAMIN 1000 MCG/ML IJ SOLN
1000.0000 ug | INTRAMUSCULAR | Status: DC
  Administered 2020-12-03: 1000 ug via INTRAMUSCULAR
  Filled 2020-11-26: qty 1

## 2020-11-26 MED ORDER — VANCOMYCIN HCL 1750 MG/350ML IV SOLN
1750.0000 mg | Freq: Once | INTRAVENOUS | Status: AC
  Administered 2020-11-26: 1750 mg via INTRAVENOUS
  Filled 2020-11-26: qty 350

## 2020-11-26 MED ORDER — CYANOCOBALAMIN 1000 MCG/ML IJ SOLN
1000.0000 ug | INTRAMUSCULAR | Status: DC
  Administered 2020-11-26: 1000 ug via INTRAMUSCULAR
  Filled 2020-11-26: qty 1

## 2020-11-26 MED ORDER — SODIUM CHLORIDE 0.9 % IV SOLN
2.0000 g | Freq: Three times a day (TID) | INTRAVENOUS | Status: AC
  Administered 2020-11-26: 2 g via INTRAVENOUS
  Filled 2020-11-26: qty 2

## 2020-11-26 NOTE — Consult Note (Signed)
WOC consulted on this patient at the time of admission Wound care orders in the chart.  No acute changes in the status of the foot.  No osteomyelitis.  Certainly could benefit from podiatry consult for long term management of foot deformities.    Re consult if needed, will not follow at this time. Thanks  Jameka Ivie [employer], RN,CWOCN, CNS, CWON-AP [phone])

## 2020-11-26 NOTE — Progress Notes (Addendum)
Pharmacy Antibiotic Note  Robert Frank is a 55 y.o. male admitted on 11/22/2020 with  cellulitis infested with maggots.  Pharmacy has been consulted for vancomycin dosing. Patient previously received one time doses of cefepime and vancomycin in the ED on 6/16. Wound culture resulted with MRSA today. This will be day 1 of vancomycin therapy. WBC 5.3 dowtrended, afebrile.   CrCl 108.69ml/min; Scr 0.65  Plan: Discontinue cefepime Start vancomycin 1750mg  IV x1 loading dose followed by 1500mg  IV q12h  Goal trough 10-80mcg/mL for cellulitis Trend WBC, renal function, clinical progress, and vanc levels as indicated F/U duration of therapy and transition to oral therapies    Height: 5\' 6"  (167.6 cm) Weight: 84.1 kg (185 lb 6.5 oz) IBW/kg (Calculated) : 63.8  Temp (24hrs), Avg:98.2 F (36.8 C), Min:98.1 F (36.7 C), Max:98.2 F (36.8 C)  Recent Labs  Lab 11/22/20 2220 11/23/20 0437 11/24/20 0505 11/25/20 0106 11/26/20 0224  WBC 15.4* 14.2* 7.6 6.2 5.3  CREATININE 1.24 0.94 0.77 0.69 0.65  LATICACIDVEN 1.7  --   --   --   --      Estimated Creatinine Clearance: 108.6 mL/min (by C-G formula based on SCr of 0.65 mg/dL).    No Known Allergies  Antimicrobials this admission: Vancomycin IV 1500 mg 6/16 x1, 6/20>> Cefepime 2g  >> 6/16>  Dose adjustments this admission: None  Microbiology results: BCx: pending Wound Cx: MRSA   Thank you for allowing pharmacy to be a part of this patient's care.  11/28/20, PharmD PGY1 Pharmacy Resident 11/26/2020 2:00 PM

## 2020-11-26 NOTE — Progress Notes (Signed)
ABI's have been completed. Preliminary results can be found in CV Proc through chart review.   11/26/20 1:04 PM Olen Cordial RVT

## 2020-11-26 NOTE — Progress Notes (Signed)
Received patient awake,alert/orientedx4 and able to verbalize needs. NAD noted; respirations easy/even on room air. Dressing to RLE c/d/I; dressing changed per orders. Movement/sensation to all other extremities noted. Whiteboard updated. All safety measures in place and personal belongings within reach.

## 2020-11-26 NOTE — Plan of Care (Signed)

## 2020-11-26 NOTE — Progress Notes (Addendum)
Family Medicine Teaching Service Daily Progress Note Intern Pager: [phone]  Patient name: Robert Frank Medical record number: [id number] Date of birth: 14-Apr-1966 Age: 55 y.o. Gender: male  Primary Care Provider: Pcp, No Consultants: Wound care Code Status: Full  Pt Overview and Major Events to Date:  11/23/2020: Admitted to FPTS  Assessment and Plan: Robert Frank is a 55 year old male presenting with right foot pain found to have cellulitis.  PMH is significant for HTN, COPD, alcohol use, left eye blindness.  R lower extremity cellulitis, Sepsis I Infestation by maggots Denies any pain this AM.  Right leg looks much improved but right foot looks same.  S/p conservative sharp wound debridement by wound care nurse.  Afebrile, no leukocytosis wound culture growing few gram-positive cocci in pairs, rare gram-positive rods, s/p Vanc x 1. -- Wound care reconsulted, awaiting further recommendations --c/w Cefepime x 7days (6/16-6/22) --f/u Blood culture --f/u Wound cultures --f/u ABI to assess arterial circulation --c/w Tylenol and tramadol PRN --c/w monitor for emergence of larva with time  Protein calorie malnutrition I generalized weakness Albumin 1.8 this am.  Appreciate nutritional assistance. --Encouraged increasing oral intake --c/w PT/OT eval and treat  Homelessness SW hoping to apply for Medicaid for this patient and also with the placement. --Appreciate SW assistance  Vitamin B12 deficiency Vitamin B12-226 this am. --Start 1000 mcg Vit B12 x 5 doses weekly, followed by monthly.  FEN/GI: Heart heathy diet PPx: Lovenox   Status is: Inpatient  Remains inpatient appropriate because:Ongoing active pain requiring inpatient pain management  Dispo: The patient is from:  Homeless              Anticipated d/c is to: SNF              Patient currently is not medically stable to d/c.   Difficult to place patient No        Subjective:  No acute overnight  events. Patient evaluated at bedside and denies any complaints today.  Objective: Temp:  [98.1 F (36.7 C)-98.5 F (36.9 C)] 98.2 F (36.8 C) (06/20 0757) Pulse Rate:  [78-96] 82 (06/20 0757) Resp:  [16-18] 16 (06/20 0757) BP: (128-141)/(76-96) 128/76 (06/20 0757) SpO2:  [100 %] 100 % (06/20 0757) Physical Exam: General: Pleasant male, easily awoke, NAD Cardiovascular: Regular rate and rhythm Respiratory: Breathing comfortably on room air, clear to auscultation bilaterally, no wheezing, dry cough is improving Abdomen: Soft, nontender, nondistended Extremities: Able to move all extremities, restriction and moving right foot  Laboratory: Recent Labs  Lab 11/24/20 0505 11/25/20 0106 11/26/20 0224  WBC 7.6 6.2 5.3  HGB 11.3* 11.9* 11.6*  HCT 33.4* 35.7* 34.8*  PLT 149* 192 249   Recent Labs  Lab 11/24/20 0505 11/25/20 0106 11/26/20 0224  NA 129* 130* 130*  K 3.5 3.8 3.5  CL 98 101 99  CO2 25 24 25   BUN 10 9 7   CREATININE 0.77 0.69 0.65  CALCIUM 7.7* 8.0* 7.9*  PROT 5.5* 5.8* 5.5*  BILITOT 0.7 0.4 0.6  ALKPHOS 51 45 43  ALT 32 41 30  AST 62* 71* 36  GLUCOSE 105* 95 108*      Imaging/Diagnostic Tests: No results found.   , MD 11/26/2020, 9:42 AM PGY-1, [facility] Family Medicine FPTS Intern pager: [phone], text pages welcome

## 2020-11-27 DIAGNOSIS — I10 Essential (primary) hypertension: Secondary | ICD-10-CM

## 2020-11-27 LAB — BASIC METABOLIC PANEL
Anion gap: 10 (ref 5–15)
BUN: 9 mg/dL (ref 6–20)
CO2: 24 mmol/L (ref 22–32)
Calcium: 7.9 mg/dL — ABNORMAL LOW (ref 8.9–10.3)
Chloride: 98 mmol/L (ref 98–111)
Creatinine, Ser: 0.62 mg/dL (ref 0.61–1.24)
GFR, Estimated: >60 mL/min (ref >60)
Glucose, Bld: 104 mg/dL — ABNORMAL HIGH (ref 70–99)
Potassium: 3.6 mmol/L (ref 3.5–5.1)
Sodium: 132 mmol/L — ABNORMAL LOW (ref 135–145)

## 2020-11-27 LAB — MRSA NEXT GEN BY PCR, NASAL: MRSA by PCR Next Gen: DETECTED — AB

## 2020-11-27 LAB — CBC
HCT: 34.5 % — ABNORMAL LOW (ref 39.0–52.0)
Hemoglobin: 11.4 g/dL — ABNORMAL LOW (ref 13.0–17.0)
MCH: 33.2 pg (ref 26.0–34.0)
MCHC: 33 g/dL (ref 30.0–36.0)
MCV: 100.6 fL — ABNORMAL HIGH (ref 80.0–100.0)
Platelets: 270 10*3/uL (ref 150–400)
RBC: 3.43 MIL/uL — ABNORMAL LOW (ref 4.22–5.81)
RDW: 13.5 % (ref 11.5–15.5)
WBC: 5.5 10*3/uL (ref 4.0–10.5)
nRBC: 0 % (ref 0.0–0.2)

## 2020-11-27 MED ORDER — DOXYCYCLINE HYCLATE 100 MG PO TABS
100.0000 mg | ORAL_TABLET | Freq: Two times a day (BID) | ORAL | Status: AC
  Administered 2020-11-27 – 2020-12-02 (×11): 100 mg via ORAL
  Filled 2020-11-27 (×11): qty 1

## 2020-11-27 MED ORDER — METOPROLOL TARTRATE 25 MG PO TABS
25.0000 mg | ORAL_TABLET | Freq: Two times a day (BID) | ORAL | Status: DC
  Administered 2020-11-27 – 2020-12-18 (×43): 25 mg via ORAL
  Filled 2020-11-27 (×43): qty 1

## 2020-11-27 NOTE — Progress Notes (Addendum)
Physical Therapy Treatment Patient Details Name: Robert Frank MRN: [id number] DOB: 08-Dec-1965 Today's Date: 11/27/2020    History of Present Illness Pt is a 55 year old man admitted on 11/22/20 after being found down covered in bodily fluids. + R LE cellulitis, sepsis, rhabdomyolosis, hyponatremia. PMH: alcohol use disorder, HTN, COPD, L eye blindness, tobacco use, CVA with residual R side weakness.    PT Comments    Pt received in supine, agreeable to therapy session and with good participation and tolerance for seated balance tasks at bedside and transfer training. Pt needing at least +58minA for bed mobility and up to modA +2 for lateral seated scoot transfer to drop arm recliner. Pt agreeable to some hygiene tasks with max encouragement and with noted poor hygiene. Pt continues to benefit from PT services to progress toward functional mobility goals. Continue to recommend SNF.  Follow Up Recommendations  SNF;Supervision/Assistance - 24 hour     Equipment Recommendations  Other (comment) (defer to next venue of care, anticipate at least a slide board and he may need wheelchair with removable arm and leg rests depending on if his is still functional or not)    Recommendations for Other Services       Precautions / Restrictions Precautions Precautions: Fall Precaution Comments: HOH; Contact precautions Restrictions Weight Bearing Restrictions: No    Mobility  Bed Mobility Overal bed mobility: Needs Assistance Bed Mobility: Supine to Sit     Supine to sit: HOB elevated;Min assist;+2 for physical assistance     General bed mobility comments: HOB elevated, cuing pt to manage legs towards R EOB, pt initiating but needing physical assistance to complete legs off bed. minA to manage legs and ascend trunk, cuing pt to reach for R bed rail. min to modA for trunk stability initially upon rising then mostly minA support needed EOB.    Transfers Overall transfer level: Needs  assistance Equipment used: Sliding board Transfers: Lateral/Scoot Transfers          Lateral/Scoot Transfers: Mod assist;+2 physical assistance;With slide board General transfer comment: toward pt L side, cues for body mechanics (not to lean backward) and UE placement needed and up to modA for scooting assist with bed transfer pads. Pt apprehensive but agreeable to attempt with encouragement.  Ambulation/Gait             General Gait Details: deferred, pt refusing to attempt due to R LE pain   Stairs             Wheelchair Mobility    Modified Rankin (Stroke Patients Only)       Balance Overall balance assessment: Needs assistance Sitting-balance support: No upper extremity supported;Feet unsupported Sitting balance-Leahy Scale: Fair Sitting balance - Comments: Varying minA to Supervision sitting EOB statically, pt with posterior/right LOB with dynamic seated task (attempting to don socks). Postural control: Posterior lean     Standing balance comment: deferred, pt unable to tolerate due to R LE pain                            Cognition Arousal/Alertness: Awake/alert Behavior During Therapy: Flat affect Overall Cognitive Status: Impaired/Different from baseline Area of Impairment: Memory;Following commands;Problem solving;Awareness;Safety/judgement                     Memory: Decreased short-term memory Following Commands: Follows one step commands with increased time Safety/Judgement: Decreased awareness of safety;Decreased awareness of deficits Awareness: Emergent Problem Solving:  Slow processing;Decreased initiation;Requires verbal cues;Difficulty sequencing;Requires tactile cues General Comments: Unsure of pt's baseline. Pt needing increased time to process cues, possibly due to being [facility]. Needs simple commands to sequence tasks also but willing to attempt transfers with heavy encouragement.      Exercises      General Comments  General comments (skin integrity, edema, etc.): pt with poor hygiene and per RN report bed bugs, pt resistant to bathing but with staff encouragement agreeable to have his back cleaned and to clean underarms and wipe his beard/face and to don new gown. Once in chair, bed sheets removed and NT notified he will need new sheets put on and floor cleaned as his floor appeared to have urine spilled on it.      Pertinent Vitals/Pain Pain Assessment: Faces Faces Pain Scale: Hurts even more Pain Location: R LE Pain Descriptors / Indicators: Discomfort;Grimacing;Guarding;Moaning Pain Intervention(s): Monitored during session;Repositioned;Premedicated before session    Home Living                      Prior Function            PT Goals (current goals can now be found in the care plan section) Acute Rehab PT Goals Patient Stated Goal: to go to SNF for rehab and to heal wound PT Goal Formulation: With patient Time For Goal Achievement: 12/07/20 Potential to Achieve Goals: Fair Progress towards PT goals: Progressing toward goals    Frequency    Min 2X/week      PT Plan Current plan remains appropriate    Co-evaluation PT/OT/SLP Co-Evaluation/Treatment: Yes Reason for Co-Treatment: Necessary to address cognition/behavior during functional activity;For patient/therapist safety;To address functional/ADL transfers PT goals addressed during session: Mobility/safety with mobility;Balance;Proper use of DME        AM-PAC PT "6 Clicks" Mobility   Outcome Measure  Help needed turning from your back to your side while in a flat bed without using bedrails?: A Little Help needed moving from lying on your back to sitting on the side of a flat bed without using bedrails?: A Little Help needed moving to and from a bed to a chair (including a wheelchair)?: A Lot Help needed standing up from a chair using your arms (e.g., wheelchair or bedside chair)?: Total Help needed to walk in hospital  room?: Total Help needed climbing 3-5 steps with a railing? : Total 6 Click Score: 11    End of Session   Activity Tolerance: Patient tolerated treatment well;Other (comment) (anxious regarding OOB mobility but agreeable with heavy encouragement.) Patient left: with call bell/phone within reach;in chair;with chair alarm set;Other (comment) (heels floated in pillow in recliner, pt encouraged to straighten his knees as much as possible) Nurse Communication: Mobility status;Other (comment) (use slide board (in room)) PT Visit Diagnosis: Muscle weakness (generalized) (M62.81);Difficulty in walking, not elsewhere classified (R26.2);Pain Pain - Right/Left: Right Pain - part of body: Ankle and joints of foot;Leg     Time: 1093-2355 PT Time Calculation (min) (ACUTE ONLY): 30 min  Charges:  $Therapeutic Activity: 8-22 mins                     Robson Trickey P., PTA Acute Rehabilitation Services Pager: [phone] Office: [phone]    Angus [place]ms 11/27/2020, 12:13 PM

## 2020-11-27 NOTE — Progress Notes (Addendum)
Family Medicine Teaching Service Daily Progress Note Intern Pager: [phone]  Patient name: Robert Frank Medical record number: [id number] Date of birth: [birth year] Age: 55 y.o. Gender: male  Primary Care Provider: Pcp, No Consultants: Wound care Code Status: Full  Pt Overview and Major Events to Date:  11/23/2020: Admitted to FPTS  Assessment and Plan: [name] is a 55 year old male presenting with right foot pain found to have cellulitis.  PMH is significant for HTN, COPD, alcohol use, left eye blindness.  R lower extremity cellulitis, Sepsis I Infestation by maggots C/o mild pain this am.  Right leg and right foot looks much improved as compared to admission. S/p conservative sharp wound debridement by wound care nurse.  Afebrile, no leukocytosis and wound culture is growing MRSA.  ABI of right and left is within normal range. 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 --Appreciate SW assistance  HTN SBP 128-149, DBP-76-101 --Increase Metoprolol 25 mg BID   FEN/GI: Heart healthy diet PPx: Lovenox   Status is: Inpatient  Remains inpatient appropriate because:Unsafe d/c plan  Dispo: The patient is from:  Homeless              Anticipated d/c is to: SNF              Patient currently is not medically stable to d/c.   Difficult to place patient No        Subjective:  No acute overnight events. Patient evaluated at bedside this morning and complains of just mild pain in his right foot.  Denies any other complaints.  Objective: Temp:  [98.1 F (36.7 C)-99 F (37.2 C)] 98.4 F (36.9 C) (06/21 0819) Pulse Rate:  [78-101] 79 (06/21 0819) Resp:  [16-19] 16 (06/21 0819) BP: (132-149)/(96-101) 132/97 (06/21 0819) SpO2:   [98 %-100 %] 100 % (06/21 0819) Physical Exam: General: Pleasant male lying comfortably in bed, NAD Cardiovascular: Regular rate and rhythm Respiratory: Clear To auscultation bilaterally, cough improved significantly Abdomen: Soft, nontender, nondistended Extremities: Little restriction involving right foot but overall able to move all extremities well.  Laboratory: Recent Labs  Lab 11/25/20 0106 11/26/20 0224 11/27/20 0322  WBC 6.2 5.3 5.5  HGB 11.9* 11.6* 11.4*  HCT 35.7* 34.8* 34.5*  PLT 192 249 270   Recent Labs  Lab 11/24/20 0505 11/25/20 0106 11/26/20 0224 11/27/20 0322  NA 129* 130* 130* 132*  K 3.5 3.8 3.5 3.6  CL 98 101 99 98  CO2 25 24 25 24   BUN 10 9 7 9   CREATININE 0.77 0.69 0.65 0.62  CALCIUM 7.7* 8.0* 7.9* 7.9*  PROT 5.5* 5.8* 5.5*  --   BILITOT 0.7 0.4 0.6  --   ALKPHOS 51 45 43  --   ALT 32 41 30  --   AST 62* 71* 36  --   GLUCOSE 105* 95 108* 104*    Imaging/Diagnostic Tests: VAS ABI WITH/WO TBI  Result Date: 11/26/2020  LOWER EXTREMITY DOPPLER STUDY Patient Name:  Robert Frank  Date of Exam:   11/26/2020 Medical Rec #: Robert Frank          Accession #:    11/28/2020 Date of Birth: 1966-03-17          Patient Gender: M Patient Age:   40Y  Exam Location:  [facility] Procedure:      VAS [place] ABI WITH/WO TBI Referring Phys: [id number] [name] --------------------------------------------------------------------------------  Indications: Ulceration. High Risk Factors: Hypertension.  Limitations: Today's exam was limited due to patient positioning, patient unable              to lie flat, an open wound, bandages and involuntary patient              movement. Comparison Study: No prior studies. Performing Technologist: Robert Frank RVT  Examination Guidelines: A complete evaluation includes at minimum, Doppler waveform signals and systolic blood pressure reading at the level of bilateral brachial, anterior tibial, and posterior tibial arteries,  when vessel segments are accessible. Bilateral testing is considered an integral part of a complete examination. Photoelectric Plethysmograph (PPG) waveforms and toe systolic pressure readings are included as required and additional duplex testing as needed. Limited examinations for reoccurring indications may be performed as noted.  ABI Findings: +--------+------------------+-----+---------+--------+ Right   Rt Pressure (mmHg)IndexWaveform Comment  +--------+------------------+-----+---------+--------+ [id number]                    triphasic         +--------+------------------+-----+---------+--------+ PTA     174               1.25 triphasic         +--------+------------------+-----+---------+--------+ DP      161               1.16 triphasic         +--------+------------------+-----+---------+--------+ +--------+------------------+-----+---------+-------+ Left    Lt Pressure (mmHg)IndexWaveform Comment +--------+------------------+-----+---------+-------+ [id number]                    triphasic        +--------+------------------+-----+---------+-------+ PTA     161               1.16 triphasic        +--------+------------------+-----+---------+-------+ DP      174               1.25 triphasic        +--------+------------------+-----+---------+-------+ +-------+-----------+-----------+------------+------------+ ABI/TBIToday's ABIToday's TBIPrevious ABIPrevious TBI +-------+-----------+-----------+------------+------------+ Right  1.25                                           +-------+-----------+-----------+------------+------------+ Left   1.25                                           +-------+-----------+-----------+------------+------------+  Summary: Right: Resting right ankle-brachial index is within normal range. No evidence of significant right lower extremity arterial disease. Left: Resting left ankle-brachial index is within  normal range. No evidence of significant left lower extremity arterial disease.  *See table(s) above for measurements and observations.  Electronically signed by [name] MD on 11/26/2020 at 6:51:40 PM.    Final      Robert Frank, Robert Rile, MD 11/27/2020, 11:15 AM PGY-1, [facility] Family Medicine FPTS Intern pager: [phone], text pages welcome

## 2020-11-27 NOTE — Progress Notes (Signed)
Occupational Therapy Treatment Patient Details Name: Robert Frank MRN: [id number] DOB: 09-02-1965 Today's Date: 11/27/2020    History of present illness Pt is a 55 year old man admitted on 11/22/20 after being found down covered in bodily fluids. + R LE cellulitis, sepsis, rhabdomyolosis, hyponatremia. PMH: alcohol use disorder, HTN, COPD, L eye blindness, tobacco use, CVA with residual R side weakness.   OT comments  Robert Frank is progressing well towards his goals. Pt with poor hygiene and requires max encouragement to bathe at bed level, educated on importance. Pt set up level for upper body tasks and max/total for lower body tasks (see details below). Pt completed slide board transfer for mod A +2 from bed>chair, transferring to his stronger side (left). Pt continues to benefit from skilled acute services to progress function in all ADLs and mobility. D/c plan remains appropriate.    Follow Up Recommendations  SNF;Supervision/Assistance - 24 hour    Equipment Recommendations  None recommended by OT       Precautions / Restrictions Precautions Precautions: Fall Precaution Comments: HOH; Contact precautions Restrictions Weight Bearing Restrictions: No       Mobility Bed Mobility Overal bed mobility: Needs Assistance Bed Mobility: Supine to Sit     Supine to sit: HOB elevated;Min assist;+2 for physical assistance     General bed mobility comments: HOB elevated, cuing pt to manage legs towards R EOB, pt initiating but needing physical assistance to complete legs off bed. minA to manage legs and ascend trunk, cuing pt to reach for R bed rail. min to modA for trunk stability initially upon rising then mostly minA support needed EOB.    Transfers Overall transfer level: Needs assistance Equipment used: Sliding board Transfers: Lateral/Scoot Transfers          Lateral/Scoot Transfers: Mod assist;+2 physical assistance;With slide board General transfer comment: toward pt L  side, cues for body mechanics (not to lean backward) and UE placement needed and up to modA for scooting assist with bed transfer pads. Pt apprehensive but agreeable to attempt with encouragement.    Balance Overall balance assessment: Needs assistance Sitting-balance support: No upper extremity supported;Feet unsupported Sitting balance-Leahy Scale: Fair Sitting balance - Comments: Varying minA to Supervision sitting EOB statically, pt with posterior/right LOB with dynamic seated task (attempting to don socks). Postural control: Posterior lean     Standing balance comment: deferred, pt unable to tolerate due to R LE pain           ADL either performed or assessed with clinical judgement   ADL Overall ADL's : Needs assistance/impaired     Grooming: Oral care;Set up;Sitting   Upper Body Bathing: Set up;Sitting           Lower Body Dressing: Maximal assistance;Sitting/lateral leans Lower Body Dressing Details (indicate cue type and reason): Pt unable to reach bilat feet to doff/don socks, requries assistance to thread BLE into underwear and pants; pt able to complete lateral leans to don over bilat hips Toilet Transfer: Moderate assistance;+2 for physical assistance;Transfer board;BSC;Requires drop arm Toilet Transfer Details (indicate cue type and reason): simulated slide board trasfer to chair; pt mod A +2         Functional mobility during ADLs: Moderate assistance;+2 for physical assistance (slide board) General ADL Comments: pt set up for upper body tasks; max/total for lower body tasks as pt is unable to reach bilat feet      Cognition Arousal/Alertness: Awake/alert Behavior During Therapy: Flat affect Overall Cognitive Status: Impaired/Different from  baseline Area of Impairment: Memory;Following commands;Problem solving;Awareness;Safety/judgement                 Orientation Level: Disoriented to;Time   Memory: Decreased short-term memory Following  Commands: Follows one step commands with increased time Safety/Judgement: Decreased awareness of safety;Decreased awareness of deficits Awareness: Emergent Problem Solving: Slow processing;Decreased initiation;Requires verbal cues;Difficulty sequencing;Requires tactile cues General Comments: Unsure of pt's baseline. Pt needing increased time to process cues, possibly due to being [employer]. Needs simple commands to sequence tasks also but willing to attempt transfers with heavy encouragement.              General Comments pt with poor hygiene and requries max encouragement to wash; RN report of bed bugs; educated importance of hygiene while in hospital for infection provention - liquid substance spilled on floor upon arrival (water vs. urine)    Pertinent Vitals/ Pain       Pain Assessment: Faces Faces Pain Scale: Hurts even more Pain Location: R LE Pain Descriptors / Indicators: Discomfort;Grimacing;Guarding;Moaning Pain Intervention(s): Monitored during session  Frequency  Min 2X/week        Progress Toward Goals  OT Goals(current goals can now be found in the care plan section)  Progress towards OT goals: Progressing toward goals  Acute Rehab OT Goals Patient Stated Goal: to go to SNF for rehab and to heal wound OT Goal Formulation: With patient Time For Goal Achievement: 12/07/20 Potential to Achieve Goals: Fair ADL Goals Pt Will Perform Grooming: with set-up;sitting Pt Will Perform Upper Body Dressing: with supervision;sitting Pt Will Perform Lower Body Dressing: with min assist;sitting/lateral leans Pt Will Transfer to Toilet: with min assist;with transfer board;bedside commode Pt Will Perform Toileting - Clothing Manipulation and hygiene: with supervision;sitting/lateral leans Additional ADL Goal #1: Pt will tolerate sitting EOB x 10 minutes as a precursor to ADL.  Plan Discharge plan remains appropriate    Co-evaluation    PT/OT/SLP Co-Evaluation/Treatment:  Yes Reason for Co-Treatment: Necessary to address cognition/behavior during functional activity;To address functional/ADL transfers PT goals addressed during session: Mobility/safety with mobility;Balance;Proper use of DME OT goals addressed during session: ADL's and self-care;Proper use of Adaptive equipment and DME      AM-PAC OT "6 Clicks" Daily Activity     Outcome Measure   Help from another person eating meals?: None Help from another person taking care of personal grooming?: A Little Help from another person toileting, which includes using toliet, bedpan, or urinal?: Total Help from another person bathing (including washing, rinsing, drying)?: A Lot Help from another person to put on and taking off regular upper body clothing?: A Lot Help from another person to put on and taking off regular lower body clothing?: A Lot 6 Click Score: 14    End of Session Equipment Utilized During Treatment: Other (comment) (slideboard)  OT Visit Diagnosis: Muscle weakness (generalized) (M62.81);Other symptoms and signs involving cognitive function   Activity Tolerance Patient tolerated treatment well;Patient limited by pain   Patient Left in chair;with chair alarm set;with call bell/phone within reach   Nurse Communication Mobility status (room in need of cleaning)        Time: 4536-4680 OT Time Calculation (min): 30 min  Charges: OT General Charges $OT Visit: 1 Visit OT Treatments $Self Care/Home Management : 8-22 mins    Chao Blazejewski A Ambert Virrueta 11/27/2020, 12:39 PM

## 2020-11-28 ENCOUNTER — Encounter (HOSPITAL_COMMUNITY): Payer: Self-pay | Admitting: Family Medicine

## 2020-11-28 DIAGNOSIS — R278 Other lack of coordination: Secondary | ICD-10-CM | POA: Diagnosis present

## 2020-11-28 DIAGNOSIS — Z9189 Other specified personal risk factors, not elsewhere classified: Secondary | ICD-10-CM

## 2020-11-28 DIAGNOSIS — Z9181 History of falling: Secondary | ICD-10-CM

## 2020-11-28 DIAGNOSIS — R413 Other amnesia: Secondary | ICD-10-CM | POA: Diagnosis present

## 2020-11-28 DIAGNOSIS — R6889 Other general symptoms and signs: Secondary | ICD-10-CM | POA: Diagnosis present

## 2020-11-28 DIAGNOSIS — M24561 Contracture, right knee: Secondary | ICD-10-CM

## 2020-11-28 DIAGNOSIS — M6281 Muscle weakness (generalized): Secondary | ICD-10-CM | POA: Diagnosis present

## 2020-11-28 DIAGNOSIS — Z993 Dependence on wheelchair: Secondary | ICD-10-CM

## 2020-11-28 DIAGNOSIS — R2689 Other abnormalities of gait and mobility: Secondary | ICD-10-CM | POA: Diagnosis present

## 2020-11-28 HISTORY — DX: Contracture, right knee: M24.561

## 2020-11-28 LAB — CBC
HCT: 35.5 % — ABNORMAL LOW (ref 39.0–52.0)
Hemoglobin: 11.9 g/dL — ABNORMAL LOW (ref 13.0–17.0)
MCH: 33.8 pg (ref 26.0–34.0)
MCHC: 33.5 g/dL (ref 30.0–36.0)
MCV: 100.9 fL — ABNORMAL HIGH (ref 80.0–100.0)
Platelets: 349 10*3/uL (ref 150–400)
RBC: 3.52 MIL/uL — ABNORMAL LOW (ref 4.22–5.81)
RDW: 13.7 % (ref 11.5–15.5)
WBC: 6.1 10*3/uL (ref 4.0–10.5)
nRBC: 0 % (ref 0.0–0.2)

## 2020-11-28 LAB — BASIC METABOLIC PANEL
Anion gap: 7 (ref 5–15)
BUN: 7 mg/dL (ref 6–20)
CO2: 27 mmol/L (ref 22–32)
Calcium: 8.2 mg/dL — ABNORMAL LOW (ref 8.9–10.3)
Chloride: 96 mmol/L — ABNORMAL LOW (ref 98–111)
Creatinine, Ser: 0.62 mg/dL (ref 0.61–1.24)
GFR, Estimated: >60 mL/min (ref >60)
Glucose, Bld: 97 mg/dL (ref 70–99)
Potassium: 3.8 mmol/L (ref 3.5–5.1)
Sodium: 130 mmol/L — ABNORMAL LOW (ref 135–145)

## 2020-11-28 NOTE — Progress Notes (Signed)
Nutrition Follow-up  DOCUMENTATION CODES:   Severe malnutrition in context of chronic illness, Severe malnutrition in context of social or environmental circumstances  INTERVENTION:   -Continue Ensure Enlive po TID, each supplement provides 350 kcal and 20 grams of protein  -Continue 30 ml Prosource Plus TID, each supplement provides 100 kcals and 15 grams protein -Continue 1 packet Juven BID, each packet provides 95 calories, 2.5 grams of protein (collagen), and 9.8 grams of carbohydrate (3 grams sugar); also contains 7 grams of L-arginine and L-glutamine, 300 mg vitamin C, 15 mg vitamin E, 1.2 mcg vitamin B-12, 9.5 mg zinc, 200 mg calcium, and 1.5 g  Calcium Beta-hydroxy-Beta-methylbutyrate to support wound healing  -Continue MVI with minerals daily  NUTRITION DIAGNOSIS:   Severe Malnutrition related to chronic illness, social / environmental circumstances as evidenced by percent weight loss, energy intake < or equal to 75% for > or equal to 1 month, mild fat depletion, mild muscle depletion.  Ongoing  GOAL:   Patient will meet greater than or equal to 90% of their needs  Progressing   MONITOR:   PO intake, Supplement acceptance, Labs, Weight trends, Skin, I & O's  REASON FOR ASSESSMENT:   Consult, Malnutrition Screening Tool Wound healing  ASSESSMENT:   55 yo male with a PMH of EtOH use disorder, HTN, COPD, L-eye blindness, tobacco use, and CVA with residual R-sided weakness who was found down covered in bodily fluids, positive for RLE cellulitis, rhabdomyolosis, and hyponatremia.  Reviewed I/O's: -1.4 L x 24 hours and +5.6 L since admission  UOP: 1.8 L x 24 hours  Pt sleeping soundly at time of visit.   Pt with great appetite. Noted meal completion 100%. Lunch tray was 100% consumed on visit. Pt has been taking supplements per MAR.   Medications reviewed and include thiamine.   Per TOC notes, pt medically stable for discharge and awaiting SNF placement.   Labs  reviewed: Na: 130.   Diet Order:   Diet Order             Diet Heart Room service appropriate? Yes with Assist; Fluid consistency: Thin  Diet effective now                   EDUCATION NEEDS:   Education needs have been addressed  Skin:  Skin Assessment: Skin Integrity Issues: Skin Integrity Issues:: Other (Comment) Other: R foot cellulitis, open  Last BM:  PTA/unknown  Height:   Ht Readings from Last 1 Encounters:  11/23/20 5\' 6"  (1.676 m)    Weight:   Wt Readings from Last 1 Encounters:  11/23/20 84.1 kg    Ideal Body Weight:  64.5 kg  BMI:  Body mass index is 29.93 kg/m.  Estimated Nutritional Needs:   Kcal:  2300-2500  Protein:  130-145 grams  Fluid:  >2 L    11/25/20, RD, LDN, CDCES Registered Dietitian II Certified Diabetes Care and Education Specialist Please refer to [facility] for RD and/or RD on-call/weekend/after hours pager

## 2020-11-28 NOTE — Progress Notes (Signed)
Family Medicine Teaching Service Daily Progress Note Intern Pager: [phone]  Patient name: Robert Frank Medical record number: [id number] Date of birth: 03-13-1966 Age: 55 y.o. Gender: male  Primary Care Provider: Pcp, No Consultants: Wound care Code Status: Full  Pt Overview and Major Events to Date:  11/23/2020: Admitted to FPTS  Assessment and Plan: Robert Frank is a 55 year old male presenting with right foot pain found to have cellulitis.  PMH is significant for HTN, COPD, alcohol use, left eye blindness.  Sepsis resolved now. Patient is medically stable for discharge.  R lower extremity cellulitis I Infestation by maggots Patient c/o mild foot pain.  Cellulitis is improving and foot looks much improved. S/p conservative sharp wound debridement by wound care nurse.  Afebrile, no leukocytosis, his wound culture is growing MRSA. --c/w Doxycycline 100 mg BID x 7 days (6/20-6/26) --f/u Blood culture --f/u Wound cultures --c/w Tylenol and tramadol PRN --c/w monitor for emergence of larva with time  Homelessness SW working hard to help patient with insurance and placement.  PT/OT suggested SNF yesterday as well. --Appreciate social workers assistance --c/w PT/OT treat  HTN SBP 118-147, DBP 91-97 --c/w Metoprolol 25 mg BID  FEN/GI: Heart healthy diet PPx: Lovenox   Status is: Inpatient  Remains inpatient appropriate because:Unsafe d/c plan  Dispo: The patient is from:  Homeless              Anticipated d/c is to: SNF              Patient currently is medically stable to d/c.   Difficult to place patient No        Subjective:  No acute overnight events. Patient evaluated at bedside this morning any complaints of mild pain in his right foot.  States had 1 bowel movement yesterday and his cough is improved now.  Objective: Temp:  [98 F (36.7 C)-98.1 F (36.7 C)] 98 F (36.7 C) (06/22 0818) Pulse Rate:  [78-82] 78 (06/22 0818) Resp:  [16-17] 17 (06/22  0818) BP: (118-147)/(91-109) 133/109 (06/22 0818) SpO2:  [100 %] 100 % (06/22 0818) Physical Exam: General: Pleasant male sitting comfortably in bed, NAD Cardiovascular: Regular rate and rhythm Respiratory: Clear to auscultation bilaterally Abdomen: Soft, nontender, nondistended Extremities: Moves all extremities but restricted right leg movement.  Laboratory: Recent Labs  Lab 11/26/20 0224 11/27/20 0322 11/28/20 0538  WBC 5.3 5.5 6.1  HGB 11.6* 11.4* 11.9*  HCT 34.8* 34.5* 35.5*  PLT 249 270 349   Recent Labs  Lab 11/24/20 0505 11/25/20 0106 11/26/20 0224 11/27/20 0322 11/28/20 0538  NA 129* 130* 130* 132* 130*  K 3.5 3.8 3.5 3.6 3.8  CL 98 101 99 98 96*  CO2 25 24 25 24 27   BUN 10 9 7 9 7   CREATININE 0.77 0.69 0.65 0.62 0.62  CALCIUM 7.7* 8.0* 7.9* 7.9* 8.2*  PROT 5.5* 5.8* 5.5*  --   --   BILITOT 0.7 0.4 0.6  --   --   ALKPHOS 51 45 43  --   --   ALT 32 41 30  --   --   AST 62* 71* 36  --   --   GLUCOSE 105* 95 108* 104* 97    Imaging/Diagnostic Tests: No results found.   , MD 11/28/2020, 8:31 AM PGY-1, [facility] Family Medicine FPTS Intern pager: [phone], text pages welcome

## 2020-11-29 DIAGNOSIS — A419 Sepsis, unspecified organism: Secondary | ICD-10-CM

## 2020-11-29 DIAGNOSIS — I10 Essential (primary) hypertension: Secondary | ICD-10-CM

## 2020-11-29 DIAGNOSIS — Z5900 Homelessness unspecified: Secondary | ICD-10-CM

## 2020-11-29 DIAGNOSIS — B8789 Myiasis of other sites: Secondary | ICD-10-CM

## 2020-11-29 DIAGNOSIS — L03116 Cellulitis of left lower limb: Secondary | ICD-10-CM

## 2020-11-29 NOTE — TOC Progression Note (Signed)
Transition of Care [facility]) - Progression Note    Patient Details  Name: Robert Frank MRN: [id number] Date of Birth: [birth year]  Transition of Care [facility]) CM/SW Contact  Ralene Bathe, LCSWA Phone Number: 11/29/2020, 12:38 PM  Clinical Narrative:     This case has been transferred to the [facility] difficult to place team.  They will now follow the patient for any d/c needs.         Expected Discharge Plan and Services                                                 Social Determinants of Health (SDOH) Interventions    Readmission Risk Interventions Readmission Risk Prevention Plan 12/19/2019  Transportation Screening Complete  PCP or Specialist Appt within 5-7 Days Complete  Home Care Screening Complete  Medication Review (RN CM) Complete  Some recent data might be hidden

## 2020-11-29 NOTE — Plan of Care (Signed)

## 2020-11-29 NOTE — Progress Notes (Addendum)
Family Medicine Teaching Service Daily Progress Note Intern Pager: [phone]  Patient name: Robert Frank Medical record number: [id number] Date of birth: 1966/01/21 Age: 55 y.o. Gender: male  Primary Care Provider: Pcp, No Consultants: Wound care Code Status: Full  Pt Overview and Major Events to Date:  11/23/2020: Admitted to FPTS  Assessment and Plan: Robert Frank is a 55 year old male presenting with right foot pain, infestation with maggots found to have cellulitis.  PMH is significant for HTN, COPD, alcohol use, left eye blindness.  Sepsis resolved now. Patient is medically stable for discharge.  R lower extremity cellulitis I Infestation by maggots Denies any pain today.  Cellulitis is improving.  S/p conservative sharp wound debridement by wound care nurse.  Afebrile, his wound culture is growing MRSA. --c/w Doxycycline 100 mg BID x 7 days (6/20-6/26) --f/u Blood culture --f/u Wound culture --c/w Tylenol and tramadol PRN --c/w monitor for emergence of larva with time.  HTN SBP 116-147, DBP 76-84 --c/w Metoprolol 25 mg BID  Homelessness Establish working to find patient insurance and placement.  PT/OT suggested SNF. -- Appreciate social workers assistance --c/w PT/OT eval and treat  FEN/GI: Heart healthy diet PPx: Lovenox Dispo: Homeless   , awaiting SNF placement .   Subjective:  No acute overnight events. Patient denies any new complaints this morning  Objective: Temp:  [98 F (36.7 C)-99.1 F (37.3 C)] 99.1 F (37.3 C) (06/22 2255) Pulse Rate:  [87-92] 92 (06/22 2255) Resp:  [16] 16 (06/22 2255) BP: (116-147)/(76-84) 147/84 (06/22 2255) SpO2:  [100 %] 100 % (06/22 2255) Physical Exam: General: Pleasant male lying comfortably in bed, NAD Cardiovascular: Regular rate and rhythm Respiratory: Clear to auscultation bilaterally Abdomen: Soft, nontender, nondistended Extremities: Restriction involving right leg but able to move all extremities  well.  Laboratory: Recent Labs  Lab 11/26/20 0224 11/27/20 0322 11/28/20 0538  WBC 5.3 5.5 6.1  HGB 11.6* 11.4* 11.9*  HCT 34.8* 34.5* 35.5*  PLT 249 270 349   Recent Labs  Lab 11/24/20 0505 11/25/20 0106 11/26/20 0224 11/27/20 0322 11/28/20 0538  NA 129* 130* 130* 132* 130*  K 3.5 3.8 3.5 3.6 3.8  CL 98 101 99 98 96*  CO2 25 24 25 24 27   BUN 10 9 7 9 7   CREATININE 0.77 0.69 0.65 0.62 0.62  CALCIUM 7.7* 8.0* 7.9* 7.9* 8.2*  PROT 5.5* 5.8* 5.5*  --   --   BILITOT 0.7 0.4 0.6  --   --   ALKPHOS 51 45 43  --   --   ALT 32 41 30  --   --   AST 62* 71* 36  --   --   GLUCOSE 105* 95 108* 104* 97    Imaging/Diagnostic Tests: No results found.   , MD 11/29/2020, 8:26 AM PGY-1, [facility] Family Medicine FPTS Intern pager: [phone], text pages welcome

## 2020-11-30 DIAGNOSIS — L03115 Cellulitis of right lower limb: Secondary | ICD-10-CM

## 2020-11-30 DIAGNOSIS — M6281 Muscle weakness (generalized): Secondary | ICD-10-CM

## 2020-11-30 DIAGNOSIS — F1020 Alcohol dependence, uncomplicated: Secondary | ICD-10-CM

## 2020-11-30 DIAGNOSIS — I10 Essential (primary) hypertension: Secondary | ICD-10-CM

## 2020-11-30 DIAGNOSIS — Z9189 Other specified personal risk factors, not elsewhere classified: Secondary | ICD-10-CM

## 2020-11-30 DIAGNOSIS — R413 Other amnesia: Secondary | ICD-10-CM

## 2020-11-30 DIAGNOSIS — R6889 Other general symptoms and signs: Secondary | ICD-10-CM

## 2020-11-30 DIAGNOSIS — Z9181 History of falling: Secondary | ICD-10-CM

## 2020-11-30 MED ORDER — ACETAMINOPHEN 325 MG PO TABS
650.0000 mg | ORAL_TABLET | Freq: Four times a day (QID) | ORAL | Status: DC
  Administered 2020-11-30 – 2020-12-05 (×18): 650 mg via ORAL
  Filled 2020-11-30 (×18): qty 2

## 2020-11-30 MED ORDER — MUPIROCIN 2 % EX OINT
1.0000 "application " | TOPICAL_OINTMENT | Freq: Two times a day (BID) | CUTANEOUS | Status: AC
  Administered 2020-11-30 – 2020-12-05 (×9): 1 via NASAL
  Filled 2020-11-30 (×3): qty 22

## 2020-11-30 MED ORDER — CHLORHEXIDINE GLUCONATE CLOTH 2 % EX PADS
6.0000 | MEDICATED_PAD | Freq: Every day | CUTANEOUS | Status: AC
  Administered 2020-12-01 – 2020-12-03 (×2): 6 via TOPICAL

## 2020-11-30 MED ORDER — TRAMADOL HCL 50 MG PO TABS
50.0000 mg | ORAL_TABLET | Freq: Four times a day (QID) | ORAL | Status: DC | PRN
  Administered 2020-11-30 – 2020-12-04 (×3): 50 mg via ORAL
  Filled 2020-11-30 (×3): qty 1

## 2020-11-30 NOTE — Progress Notes (Signed)
Physical Therapy Treatment Patient Details Name: Robert Frank MRN: [id number] DOB: [birth year] Today's Date: 11/30/2020    History of Present Illness Pt is a 55 year old man admitted on 11/22/20 after being found down covered in bodily fluids. + R LE cellulitis, sepsis, rhabdomyolosis, hyponatremia. PMH: alcohol use disorder, HTN, COPD, L eye blindness, tobacco use, CVA with residual R side weakness.    PT Comments    Pt progressing slowly.  He remains limited due to R sided weakness from pain and residual weakness from his CVA.  Performed transfer from bed to standing and then stand pivot from toward his L side into recliner.  Performed x 2 hop steps with limited foot clearance and pivot of supporting LLE to back to recliner.     Follow Up Recommendations  SNF;Supervision/Assistance - 24 hour     Equipment Recommendations  Other (comment) (TBA at next level of care.)    Recommendations for Other Services       Precautions / Restrictions Precautions Precautions: Fall Precaution Comments: HOH; Contact precautions Restrictions Weight Bearing Restrictions: Yes RLE Weight Bearing: Weight bearing as tolerated    Mobility  Bed Mobility Overal bed mobility: Needs Assistance Bed Mobility: Supine to Sit;Sit to Supine     Supine to sit: HOB elevated;Min assist;+2 for physical assistance     General bed mobility comments: HOB elevated this session he was able to maneuver to edge of bed this session with R side balance impairments.  RLE contracted into flexion and he lacks ability to even allow PTA to palpate this extremity due to pain.    Transfers Overall transfer level: Needs assistance Equipment used: Rolling walker (2 wheeled) Transfers: Sit to/from [employer] Sit to Stand: Mod assist;From elevated surface Stand pivot transfers: Mod assist       General transfer comment: Cues for hand placement to boost into standing this session.  Pt required  assistance for hand placement and physical assistance to maintain balance.  Mod assistance to pivot and move his RW from bed to recliner.  Performed hop to and shuffling pattern to pivot from bed to recliner.  Ambulation/Gait                 Stairs             Wheelchair Mobility    Modified Rankin (Stroke Patients Only)       Balance Overall balance assessment: Needs assistance Sitting-balance support: No upper extremity supported;Feet unsupported Sitting balance-Leahy Scale: Fair Sitting balance - Comments: Varying minA to Supervision sitting EOB statically, pt with posterior/right LOB with dynamic seated task (attempting to don socks). Postural control: Posterior lean   Standing balance-Leahy Scale: Poor                              Cognition Arousal/Alertness: Awake/alert Behavior During Therapy: Flat affect Overall Cognitive Status: Impaired/Different from baseline Area of Impairment: Memory;Following commands;Problem solving;Awareness;Safety/judgement                 Orientation Level: Disoriented to;Time   Memory: Decreased short-term memory Following Commands: Follows one step commands with increased time Safety/Judgement: Decreased awareness of safety;Decreased awareness of deficits Awareness: Emergent Problem Solving: Slow processing;Decreased initiation;Requires verbal cues;Difficulty sequencing;Requires tactile cues General Comments: Unsure of pt's baseline. Pt needing increased time to process cues, possibly due to being [facility]. Needs simple commands to sequence tasks also but willing to attempt transfers with heavy encouragement.  Exercises      General Comments        Pertinent Vitals/Pain Pain Assessment: Faces Pain Location: R LE Pain Descriptors / Indicators: Discomfort;Grimacing;Guarding;Moaning Pain Intervention(s): Monitored during session;Repositioned    Home Living                      Prior  Function            PT Goals (current goals can now be found in the care plan section) Acute Rehab PT Goals Patient Stated Goal: to go to SNF for rehab and to heal wound Potential to Achieve Goals: Fair Progress towards PT goals: Progressing toward goals    Frequency    Min 2X/week      PT Plan Current plan remains appropriate    Co-evaluation              AM-PAC PT "6 Clicks" Mobility   Outcome Measure  Help needed turning from your back to your side while in a flat bed without using bedrails?: A Little Help needed moving from lying on your back to sitting on the side of a flat bed without using bedrails?: A Little Help needed moving to and from a bed to a chair (including a wheelchair)?: A Lot Help needed standing up from a chair using your arms (e.g., wheelchair or bedside chair)?: A Lot Help needed to walk in hospital room?: A Lot Help needed climbing 3-5 steps with a railing? : Total 6 Click Score: 13    End of Session Equipment Utilized During Treatment: Gait belt Activity Tolerance: Patient tolerated treatment well Patient left: with call bell/phone within reach;in chair;with chair alarm set (unable to recliner chair due to flexion contracture.  Pt sitting up to eat his lunch with chair alarm turned on.) Nurse Communication: Mobility status;Other (comment) PT Visit Diagnosis: Muscle weakness (generalized) (M62.81);Difficulty in walking, not elsewhere classified (R26.2);Pain Pain - Right/Left: Right Pain - part of body: Ankle and joints of foot;Leg     Time: 3614-4315 PT Time Calculation (min) (ACUTE ONLY): 27 min  Charges:  $Therapeutic Activity: 23-37 mins                     Robert Frank , PTA Acute Rehabilitation Services Pager [phone] Office [phone]    Robert Frank 11/30/2020, 12:13 PM

## 2020-11-30 NOTE — Progress Notes (Addendum)
Family Medicine Teaching Service Daily Progress Note Intern Pager: [phone]  Patient name: Robert Frank Medical record number: [id number] Date of birth: [birth year] Age: 55 y.o. Gender: male  Primary Care Provider: Pcp, No Consultants: Wound care Code Status: Full  Pt Overview and Major Events to Date:  11/23/2020: Admitted to FPTS   Assessment and Plan: Robert Frank is a 55 year old male presenting with right foot pain, infestation with maggots found to have cellulitis.  PMH is significant for HTN, COPD, alcohol use, left eye blindness.  Sepsis resolved now. Patient is medically stable for discharge.  Right lower extremity cellulitis  Infestation by maggots Cellulitis continues to improve, patient remains afebrile. Wound culture with MRSA. No further maggots noted on exam, though wound mostly covered.  Upon medication review, patient has been receiving the tramadol but not Tylenol so we will schedule the Tylenol and for the tramadol as as needed only for severe pain.  We will continue to monitor and decrease Tylenol from scheduled as appropriate and discontinue tramadol if pain well controlled on Tylenol. - Continue doxycycline 100mg  BID x7 days (6/20-6/26) - Tylenol 650 mg every 6 hours scheduled - Tramadol PRN for severe pain (pain scale over 7) - Continue to monitor for re-emergence of larvae   HTN BP range in the last 24h 104-125/59-100.  Manual blood pressure this morning was 104/72. - Continue metoprolol 25mg  BID  Homelessness PT/OT recommendation for SNF - Appreciate assistance from [facility] - Continue PT/OT  FEN/GI: Heart healthy diet PPx: Lovenox Dispo:SNF  Dispo pending social situation . Barriers include homelessness, uninsured. Medically stable for discharge  Subjective:  Patient has no complaints this morning and is ready for breakfast.  Objective: Temp:  [98 F (36.7 C)-98.4 F (36.9 C)] 98.4 F (36.9 C) (06/23 2302) Pulse Rate:  [76-89] 76 (06/24 0848) Resp:   [14-17] 17 (06/23 2302) BP: (104-125)/(59-100) 104/72 (06/24 0848) SpO2:  [99 %-100 %] 99 % (06/23 2302) Physical Exam: General: NAD, supine in bed, initially sleeping but easily awoken Cardiovascular: RRR, no murmur appreciated Respiratory: Breathing comfortably room air, no increased repeating, clear to auscultation bilaterally Abdomen: Soft, nontender, nondistended Extremities: Restricted movement of the right knee due to contracture, able to move all extremities well.  Right foot healing from cellulitis with bandage that is C/D/I  Laboratory: Recent Labs  Lab 11/26/20 0224 11/27/20 0322 11/28/20 0538  WBC 5.3 5.5 6.1  HGB 11.6* 11.4* 11.9*  HCT 34.8* 34.5* 35.5*  PLT 249 270 349   Recent Labs  Lab 11/24/20 0505 11/25/20 0106 11/26/20 0224 11/27/20 0322 11/28/20 0538  NA 129* 130* 130* 132* 130*  K 3.5 3.8 3.5 3.6 3.8  CL 98 101 99 98 96*  CO2 25 24 25 24 27   BUN 10 9 7 9 7   CREATININE 0.77 0.69 0.65 0.62 0.62  CALCIUM 7.7* 8.0* 7.9* 7.9* 8.2*  PROT 5.5* 5.8* 5.5*  --   --   BILITOT 0.7 0.4 0.6  --   --   ALKPHOS 51 45 43  --   --   ALT 32 41 30  --   --   AST 62* 71* 36  --   --   GLUCOSE 105* 95 108* 104* 97    Imaging/Diagnostic Tests: No results found.   11/29/20, DO 11/30/2020, 8:51 AM PGY-1, [facility] Family Medicine FPTS Intern pager: [phone], text pages welcome

## 2020-11-30 NOTE — Plan of Care (Signed)
  Problem: Health Behavior/Discharge Planning: Goal: Ability to manage health-related needs will improve Outcome: Not Progressing   Problem: Pain Managment: Goal: General experience of comfort will improve Outcome: Progressing   Problem: Skin Integrity: Goal: Risk for impaired skin integrity will decrease Outcome: Progressing

## 2020-11-30 NOTE — Plan of Care (Signed)

## 2020-11-30 NOTE — Progress Notes (Addendum)
Family Medicine Teaching Service Daily Progress Note Intern Pager: [phone]  Patient name: Robert Frank Medical record number: [id number] Date of birth: 10-17-1965 Age: 55 y.o. Gender: male  Primary Care Provider: Pcp, No Consultants: Wound care Code Status: Full  Pt Overview and Major Events to Date:  11/23/2020: Admitted to FPTS   Assessment and Plan: [name] is a 55 year old male presenting with right foot pain, infestation with maggots found to have cellulitis.  PMH is significant for HTN, COPD, alcohol use, left eye blindness.  Sepsis resolved now. Patient is medically stable for discharge.  Right lower extremity cellulitis  Improved appearance of right foot and LE. No visualization of maggots or bed bugs on exam this AM. Patient reports mild pain controlled with medications.  T max 99.1 overnight. Patient received two doses of tramadol overnight.  - Continue doxycycline 100mg  BID, day 6/7 total  - Tylenol 650 mg every 6 hours scheduled - Tramadol PRN for severe pain (pain scale over 7) - Continue to monitor for re-emergence of larvae   HTN BP range WNL overnight, 104-121/72-88.   - Continue metoprolol 25mg  BID  Unsafe Living Conditions  PT/OT recommendation for SNF - SNF placement pending  - Appreciate assistance from [facility] - Continue PT/OT  FEN/GI: Heart healthy diet PPx: Lovenox  Dispo:SNF  Dispo pending social situation . Barriers include unsafe home conditions without running water or electricity, uninsured.  Medically stable for discharge  Subjective:  Patient reports mild pain that is well controlled with pain medications and keeping his foot down. He denies respiratory complaints, abdominal pain, nausea or diarrhea.    Objective: Temp:  [98.7 F (37.1 C)-99.1 F (37.3 C)] 98.7 F (37.1 C) (06/24 2305) Pulse Rate:  [76-84] 84 (06/24 2305) Resp:  [17-18] 18 (06/24 2305) BP: (104-121)/(72-88) 121/88 (06/24 2305) SpO2:  [100 %] 100 % (06/24  1509)  Physical Exam: General: male appearing older than stated age, sleeping in bed in NAD  Cardiovascular: RRR without murmurs  Respiratory: CTAB, stable on RA, no respiratory distress Abdomen: soft, Nontender, BS present  Extremities: right LE wrapped in ACE bandage,no insect visualized on exam, peeling skin   Laboratory: Recent Labs  Lab 11/26/20 0224 11/27/20 0322 11/28/20 0538  WBC 5.3 5.5 6.1  HGB 11.6* 11.4* 11.9*  HCT 34.8* 34.5* 35.5*  PLT 249 270 349   Recent Labs  Lab 11/25/20 0106 11/26/20 0224 11/27/20 0322 11/28/20 0538  NA 130* 130* 132* 130*  K 3.8 3.5 3.6 3.8  CL 101 99 98 96*  CO2 24 25 24 27   BUN 9 7 9 7   CREATININE 0.69 0.65 0.62 0.62  CALCIUM 8.0* 7.9* 7.9* 8.2*  PROT 5.8* 5.5*  --   --   BILITOT 0.4 0.6  --   --   ALKPHOS 45 43  --   --   ALT 41 30  --   --   AST 71* 36  --   --   GLUCOSE 95 108* 104* 97    Imaging/Diagnostic Tests: No results found.   11/29/20, MD 12/01/2020, 6:23 AM PGY-2, [facility] Family Medicine FPTS Intern pager: [phone], text pages welcome

## 2020-11-30 NOTE — TOC CAGE-AID Note (Addendum)
Transition of Care [facility]) - CAGE-AID Screening   Patient Details  Name: Robert Frank MRN: [id number] Date of Birth: [birth year]  [facility]) CM/SW Contact:    Curlene Labrum, RN Phone Number: 11/30/2020, 10:34 AM   Clinical Narrative: Case management met with the patient at the bedside to discuss transitions of care.  The patient was alert and oriented times four and was able to participate in CAGE assessment.  The patient states that he lives with his brother in his deceased mother's [place]na Dunks) home at [address] in [place].  The patient states that he does not have running water nor electricity since 2014.  The patient states that he drinks 4 - 42 ounce beers daily and last admission to alcohol rehabilitation was 2 years ago.  CAGE assessment was completed and the patient was given outpatient rehabilitation substance abuse resources.  The patient states that he does not have Medicaid and lost the paperwork that he received a year ago and has not followed up.  The patient states that he receives food stamps and his usual mode of transportation is a scooter.  The patient has a scooter, wheelchair and RW at the home where he resides with his brother.  Madilyn Fireman, MSW placed a phone call to Natividad Brood, [place] MSW - please see note.  The patient is agreeable to SNF placement for rehabilitation  - pending patient's qualification for Medicaid.  The patient has no insurance no no payer source for SNF available at this time and [place] is not an option since the patient's home is unsafe for caregivers along with no running water or electricity.  The patient drinks ETOH daily and also smokes cigarettes - patient is aware that he will not be able to smoke cigarettes or drink alcohol at the facility if I am able to find SNF placement - pending Medicaid eligibility.  The patient states that he is unvaccinated for COVID.  CM and MSW will continue to follow  the patient for transitions of care.  Ileana Roup, DSS MSW is following the patient for open APS case.  CAGE-AID Screening:    Have You Ever Felt You Ought to Cut Down on Your Drinking or Drug Use?: No Have People Annoyed You By Critizing Your Drinking Or Drug Use?: No Have You Felt Bad Or Guilty About Your Drinking Or Drug Use?: No Have You Ever Had a Drink or Used Drugs First Thing In The Morning to Steady Your Nerves or to Get Rid of a Hangover?: Yes CAGE-AID Score: 1  Substance Abuse Education Offered: Yes  Substance abuse interventions: Scientist, clinical (histocompatibility and immunogenetics)

## 2020-11-30 NOTE — [place] FL2 (Signed)
[place] MEDICAID FL2 LEVEL OF CARE SCREENING TOOL     IDENTIFICATION  Patient Name: Robert Frank Birthdate: 22-Sep-1965 Sex: male Admission Date (Current Location): 11/22/2020  [facility] and [place] Number:  Producer, television/film/video and Address:  [facility] [place], [address], [place], [place] [address]      Provider Number: [id number]  Attending Physician Name and Address:  Carney Living, MD  Relative Name and Phone Number:  Ritta Slot, local minister - [phone]    Current Level of Care: Hospital Recommended Level of Care: Skilled Nursing Facility Prior Approval Number:    Date Approved/Denied:   PASRR Number: pending  Discharge Plan: SNF    Current Diagnoses: Patient Active Problem List   Diagnosis Date Noted   Cellulitis of right foot    Memory impairment 11/28/2020   Knee contracture, right 11/28/2020   At risk for falls 11/28/2020   Wheelchair dependent 11/28/2020   Generalized muscle weakness 11/28/2020   Activity intolerance 11/28/2020   Impairment of balance 11/28/2020   Coordination impairment 11/28/2020   At high risk for altered skin integrity 11/28/2020   Vitamin B12 deficiency    Infestation by maggots, wound infestation, right foot and ankle 11/24/2020   Cellulitis of right lower extremity 11/23/2020   Dehydration    Elevated CK    Hypoalbuminemia    Severe protein-calorie malnutrition (HCC)    Abscess of left lower leg    Anemia of chronic disease 12/18/2019   Essential hypertension 12/17/2019   Bilateral cellulitis of lower leg 12/16/2019   Bilateral lower leg cellulitis 12/16/2019   Hypokalemia 12/05/2019   Hypomagnesemia 12/05/2019   Generalized weakness 12/04/2019   Transaminitis 12/04/2019   Decubitus skin ulcer 12/04/2019   Hyponatremia 12/04/2019   Alcohol withdrawal (HCC) 08/17/2019   Encephalopathy acute 08/17/2019   Alcoholism (HCC)    Cellulitis    COPD (chronic obstructive pulmonary disease)  (HCC) 10/06/2018    Orientation RESPIRATION BLADDER Height & Weight     Self, Time, Situation, Place  Normal Continent Weight: 84.1 kg Height:  5\' 6"  (167.6 cm)  BEHAVIORAL SYMPTOMS/MOOD NEUROLOGICAL BOWEL NUTRITION STATUS      Continent Diet (See Discharge Summary)  AMBULATORY STATUS COMMUNICATION OF NEEDS Skin   Extensive Assist Verbally Other (Comment) (Right Lower Leg wounds)                       Personal Care Assistance Level of Assistance  Bathing, Feeding, Dressing Bathing Assistance: Limited assistance Feeding assistance: Independent Dressing Assistance: Limited assistance     Functional Limitations Info  Sight, Hearing, Speech Sight Info: 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) Hearing Info: Adequate Speech Info: Adequate    SPECIAL CARE FACTORS FREQUENCY  PT (By licensed PT), OT (By licensed OT)     PT Frequency: 2-3 times per week OT Frequency: 2-3 times per week            Contractures Contractures Info: Not present    Additional Factors Info  Allergies, Isolation Precautions         Isolation Precautions Info: contact precautions - wound MRSA     Current Medications (11/30/2020):  This is the current hospital active medication list Current Facility-Administered Medications  Medication Dose Route Frequency Provider Last Rate Last Admin   (feeding supplement) PROSource Plus liquid 30 mL  30 mL Oral TID BM McDiarmid, 12/02/2020, MD   30 mL at 11/30/20 0853   acetaminophen (TYLENOL) tablet 650 mg  650 mg Oral Q6H Autry-Lott, Simone, DO       bacitracin ointment   Topical BID Carney Living, MD   Given at 11/30/20 0854   [START ON 12/03/2020] cyanocobalamin ((VITAMIN B-12)) injection 1,000 mcg  1,000 mcg Intramuscular Q7 days Dagar, [name], MD       doxycycline (VIBRA-TABS) tablet 100 mg  100 mg Oral Q12H Dagar, Anjali, MD   100 mg at 11/30/20 0853   enoxaparin (LOVENOX) injection 40 mg  40 mg Subcutaneous Q24H Dagar, Anjali, MD   40 mg at 11/29/20 1528    feeding supplement (ENSURE ENLIVE / ENSURE PLUS) liquid 237 mL  237 mL Oral TID BM McDiarmid, Leighton Roach, MD   237 mL at 11/29/20 1529   folic acid (FOLVITE) tablet 1 mg  1 mg Oral Daily Simmons-Robinson, Makiera, MD   1 mg at 11/30/20 0853   guaiFENesin (ROBITUSSIN) 100 MG/5ML solution 100 mg  5 mL Oral Q6H PRN Dagar, [name], MD       ipratropium-albuterol (DUONEB) 0.5-2.5 (3) MG/3ML nebulizer solution 3 mL  3 mL Nebulization Q6H PRN Chambliss, Estill Batten, MD       metoprolol tartrate (LOPRESSOR) tablet 25 mg  25 mg Oral BID Dagar, [name], MD   25 mg at 11/30/20 6314   multivitamin with minerals tablet 1 tablet  1 tablet Oral Daily Simmons-Robinson, Makiera, MD   1 tablet at 11/30/20 0853   nicotine (NICODERM CQ - dosed in mg/24 hours) patch 14 mg  14 mg Transdermal Daily Simmons-Robinson, Makiera, MD   14 mg at 11/30/20 9702   nutrition supplement (JUVEN) (JUVEN) powder packet 1 packet  1 packet Oral BID BM McDiarmid, Leighton Roach, MD   1 packet at 11/30/20 0852   thiamine tablet 100 mg  100 mg Oral Daily Leander Rams, RPH   100 mg at 11/30/20 6378   traMADol (ULTRAM) tablet 50 mg  50 mg Oral Q6H PRN Autry-Lott, Randa Evens, DO         Discharge Medications: Please see discharge summary for a list of discharge medications.  Relevant Imaging Results:  Relevant Lab Results:   Additional Information SS# [id number]  Janae Bridgeman, RN

## 2020-11-30 NOTE — Progress Notes (Addendum)
10am: CSW spoke with Donovan Kail at [employer] who is requesting patient be screened by financial counseling for Medicaid benefits to assist DSS Medicaid worker.  CSW sent referral via e-mail to [place] [employer] requesting her assistance.  7:30am: CSW spoke with Lyndle Herrlich, RN of [employer] who states this patient is not a candidate for the program at this time.  CSW spoke with Levora Angel of [employer] who states he is working with Gillermina Phy, Medicaid worker to determine if patient is eligible for benefits.   CSW reviewed [place] [employer] and patient does not have any active Medicaid benefits.  Edwin Dada, MSW, LCSW Transitions of Care  Clinical Social Worker II [phone]

## 2020-12-01 DIAGNOSIS — J431 Panlobular emphysema: Secondary | ICD-10-CM

## 2020-12-01 NOTE — Progress Notes (Signed)
Occupational Therapy Treatment Patient Details Name: Robert Frank MRN: [id number] DOB: 1966/06/07 Today's Date: 12/01/2020    History of present illness Pt is a 55 year old man admitted on 11/22/20 after being found down covered in bodily fluids. + R LE cellulitis, sepsis, rhabdomyolosis, hyponatremia. PMH: alcohol use disorder, HTN, COPD, L eye blindness, tobacco use, CVA with residual R side weakness.   OT comments  Robert Frank is progressing well and continues to present with R foot pain when weightbearing. Given the choice, pt chose to lateral scoot with use of slide board from bed>chair transfer. Pt required max vc to sequence task and min physical assistance. Pt demonstrated improved bed mobility today given min guard overall. Pt continues to benefit from continued OT acutely to maximize function in ADLs and mobility. D/c plan remains appropriate.    Follow Up Recommendations  SNF;Supervision/Assistance - 24 hour    Equipment Recommendations  None recommended by OT       Precautions / Restrictions Precautions Precautions: Fall Precaution Comments: HOH; Contact precautions Restrictions Weight Bearing Restrictions: Yes RLE Weight Bearing: Weight bearing as tolerated (pt is unable to tolerate mcuh weight through R foot due to pain)       Mobility Bed Mobility Overal bed mobility: Needs Assistance Bed Mobility: Supine to Sit     Supine to sit: Min guard;HOB elevated     General bed mobility comments: min guard for bed mobility, cues for hand placement    Transfers Overall transfer level: Needs assistance Equipment used: Sliding board Transfers: Lateral/Scoot Transfers          Lateral/Scoot Transfers: Min assist;With slide board General transfer comment: cues for hand placement, incrased time for problem solving, min A for boosting    Balance Overall balance assessment: Needs assistance Sitting-balance support: No upper extremity supported;Feet  unsupported Sitting balance-Leahy Scale: Fair         ADL either performed or assessed with clinical judgement   ADL Overall ADL's : Needs assistance/impaired       Grooming Details (indicate cue type and reason): pt declined all grooming tasks                 Toilet Transfer: Minimal assistance;Transfer board;Requires drop arm;BSC Toilet Transfer Details (indicate cue type and reason): smulated to recliner chair         Functional mobility during ADLs: Minimal assistance;Cueing for safety;Cueing for sequencing General ADL Comments: pt declines all bathing/grooming tasks - he is able to complete slide board transfer with max vc and min physical assistance      Cognition Arousal/Alertness: Awake/alert Behavior During Therapy: Flat affect Overall Cognitive Status: Impaired/Different from baseline Area of Impairment: Memory;Following commands;Problem solving;Awareness;Safety/judgement                 Orientation Level: Disoriented to;Time   Memory: Decreased short-term memory Following Commands: Follows one step commands with increased time Safety/Judgement: Decreased awareness of safety;Decreased awareness of deficits Awareness: Emergent Problem Solving: Slow processing;Decreased initiation;Requires verbal cues;Difficulty sequencing;Requires tactile cues General Comments: Unsure of pt's basline, pt demonstrateds poor initiation to task, needs incrased time to process cues and simple commands to sequence through transfer task              General Comments no new concerns present    Pertinent Vitals/ Pain       Pain Assessment: Faces Faces Pain Scale: Hurts little more Pain Location: R LE Pain Descriptors / Indicators: Discomfort;Grimacing;Guarding;Moaning Pain Intervention(s): Monitored during session  Frequency  Min  2X/week        Progress Toward Goals  OT Goals(current goals can now be found in the care plan section)  Progress towards OT  goals: Progressing toward goals  Acute Rehab OT Goals Patient Stated Goal: to go to SNF for rehab and to heal wound OT Goal Formulation: With patient Time For Goal Achievement: 12/07/20 Potential to Achieve Goals: Fair ADL Goals Pt Will Perform Grooming: with set-up;sitting Pt Will Perform Upper Body Dressing: with supervision;sitting Pt Will Perform Lower Body Dressing: with min assist;sitting/lateral leans Pt Will Transfer to Toilet: with min assist;with transfer board;bedside commode Pt Will Perform Toileting - Clothing Manipulation and hygiene: with supervision;sitting/lateral leans Additional ADL Goal #1: Pt will tolerate sitting EOB x 10 minutes as a precursor to ADL.  Plan Discharge plan remains appropriate       AM-PAC OT "6 Clicks" Daily Activity     Outcome Measure   Help from another person eating meals?: None Help from another person taking care of personal grooming?: A Little Help from another person toileting, which includes using toliet, bedpan, or urinal?: A Lot Help from another person bathing (including washing, rinsing, drying)?: A Lot Help from another person to put on and taking off regular upper body clothing?: A Little Help from another person to put on and taking off regular lower body clothing?: A Lot 6 Click Score: 16    End of Session Equipment Utilized During Treatment: Other (comment)  OT Visit Diagnosis: Muscle weakness (generalized) (M62.81);Other symptoms and signs involving cognitive function   Activity Tolerance Patient tolerated treatment well;Patient limited by pain   Patient Left in chair;with chair alarm set;with call bell/phone within reach   Nurse Communication Mobility status        Time: 5284-1324 OT Time Calculation (min): 15 min  Charges: OT General Charges $OT Visit: 1 Visit OT Treatments $Therapeutic Activity: 8-22 mins     Marc Sivertsen A Xavian Hardcastle 12/01/2020, 2:17 PM

## 2020-12-01 NOTE — Progress Notes (Signed)
Family Medicine Teaching Service Daily Progress Note Intern Pager: [phone]  Patient name: Robert Frank Medical record number: [id number] Date of birth: 25-Feb-1966 Age: 55 y.o. Gender: male  Primary Care Provider: Pcp, No Consultants: WOCN Code Status: FULL  Pt Overview and Major Events to Date:  06/17: Admitted to FPTS  Assessment and Plan: Robert Frank is a 55 year old male presenting with right foot pain, infestation with maggots found to have cellulitis.  PMH is significant for HTN, COPD, alcohol use, left eye blindness.  Sepsis resolved now.  Patient is medically stable for discharge to SNF.   Right lower extremity cellulitis  Improving.  Right lower extremity with wrap.  Dry and intact.  No Maggots or Bed Bugs noted.Received Tylenol for pain overnight with relief.  Has remained afebrile. -Last dose of Doxycyline 06/26 at 2200 -Continue Tylenol 650 mg q6h  -Tramadol 50 mg q6h prn > 7/10 pain -Monitor for bed bug/ maggot larvae reinfestation    HTN Normotensive -Continue Metoprolol 25 mg BID  Unsafe Living Conditions  Homeless -PT/OT following -SNF recommened, awaiting placement -CSW following  FEN/GI: Heart Healthy PPx: Lovenox Dispo:SNF in 3 or more days. Barriers include Homelessness, chronic ETOH use.   Subjective:  No acute events overnight.  No complaints of chest pain or shortness of breath.  Appetite good.  Voiding well and passing gas.  Pain well controlled with scheduled Tylenol.    Objective: Temp:  [97.9 F (36.6 C)-98.7 F (37.1 C)] 98.3 F (36.8 C) (06/25 2100) Pulse Rate:  [61-90] 61 (06/25 2100) Resp:  [17-18] 17 (06/25 2100) BP: (105-129)/(77-78) 123/78 (06/25 2100) SpO2:  [99 %] 99 % (06/25 2100)  Physical Exam:  General: 55 y.o. male in NAD Cardio: RRR no m/r/g Lungs: CTAB, no wheezing, no rhonchi, no crackles, no IWOB on room air Abdomen: Soft, non-tender to palpation, non-distended, positive bowel sounds Skin: warm and  dry Extremities: no lower extremity edema, right lower foot wrapped in ace bandage, dry and intact.  No larvae or bed bugs noted on either extremity.  Laboratory: Recent Labs  Lab 11/26/20 0224 11/27/20 0322 11/28/20 0538  WBC 5.3 5.5 6.1  HGB 11.6* 11.4* 11.9*  HCT 34.8* 34.5* 35.5*  PLT 249 270 349   Recent Labs  Lab 11/26/20 0224 11/27/20 0322 11/28/20 0538  NA 130* 132* 130*  K 3.5 3.6 3.8  CL 99 98 96*  CO2 25 24 27   BUN 7 9 7   CREATININE 0.65 0.62 0.62  CALCIUM 7.9* 7.9* 8.2*  PROT 5.5*  --   --   BILITOT 0.6  --   --   ALKPHOS 43  --   --   ALT 30  --   --   AST 36  --   --   GLUCOSE 108* 104* 97     Imaging/Diagnostic Tests: No results found.   , MD 12/02/2020, 6:54 AM PGY-2, [facility] FPTS Intern pager: [phone], text pages welcome

## 2020-12-01 NOTE — Plan of Care (Signed)

## 2020-12-02 NOTE — Progress Notes (Signed)
FPTS Interim Progress Note Patient sleeping and resting comfortably.  Rounded with primary RN.  No concerns voiced.  No orders required.  Appreciated nightly round.  Today's Vitals   12/01/20 0409 12/01/20 0858 12/01/20 1856 12/01/20 2100  BP:  105/77 129/78 123/78  Pulse:  79 90 61  Resp:  18  17  Temp:  97.9 F (36.6 C) 98.7 F (37.1 C) 98.3 F (36.8 C)  TempSrc:  Oral Oral Oral  SpO2:  99% 99% 99%  Weight:      Height:      PainSc: 2  0-No pain  7    Dana Allan, MD 12/02/2020, 2:08 AM PGY-2, [facility] Family Medicine Service pager [phone]

## 2020-12-03 LAB — BASIC METABOLIC PANEL
Anion gap: 8 (ref 5–15)
BUN: 18 mg/dL (ref 6–20)
CO2: 29 mmol/L (ref 22–32)
Calcium: 8.7 mg/dL — ABNORMAL LOW (ref 8.9–10.3)
Chloride: 98 mmol/L (ref 98–111)
Creatinine, Ser: 0.76 mg/dL (ref 0.61–1.24)
GFR, Estimated: >60 mL/min (ref >60)
Glucose, Bld: 92 mg/dL (ref 70–99)
Potassium: 3.8 mmol/L (ref 3.5–5.1)
Sodium: 135 mmol/L (ref 135–145)

## 2020-12-03 LAB — CBC
HCT: 39.5 % (ref 39.0–52.0)
Hemoglobin: 12.8 g/dL — ABNORMAL LOW (ref 13.0–17.0)
MCH: 33.4 pg (ref 26.0–34.0)
MCHC: 32.4 g/dL (ref 30.0–36.0)
MCV: 103.1 fL — ABNORMAL HIGH (ref 80.0–100.0)
Platelets: 503 10*3/uL — ABNORMAL HIGH (ref 150–400)
RBC: 3.83 MIL/uL — ABNORMAL LOW (ref 4.22–5.81)
RDW: 13.6 % (ref 11.5–15.5)
WBC: 6.2 10*3/uL (ref 4.0–10.5)
nRBC: 0 % (ref 0.0–0.2)

## 2020-12-03 LAB — CULTURE, BLOOD (ROUTINE X 2)
Culture: NO GROWTH
Special Requests: ADEQUATE

## 2020-12-03 NOTE — Progress Notes (Signed)
Family Medicine Teaching Service Daily Progress Note Intern Pager: [phone]  Patient name: Robert Frank Medical record number: [id number] Date of birth: Jun 26, 1965 Age: 55 y.o. Gender: male  Primary Care Provider: Pcp, No Consultants: Wound care (s/o), PT/OT Code Status: Full  Pt Overview and Major Events to Date:  6/17: admitted  Robert Frank is a 56 y.o. male who presented with right foot pain and maggot infestation, found to have cellulitis. PMH significant for HTN, COPD, alcohol use, left eye blindness.  Assessment and Plan:  Patient medically stable for discharge to SNF.  R Lower Extremity Cellulitis Dressing in place on R lower extremity, clean/dry/intact. Completed 10 day course of antibiotics yesterday (Vanc & Cefepime, followed by PO Doxycycline). Remains afebrile. -Tylenol 650mg  q6h scheduled -Tramadol 50mg  q6h prn for severe pain -Bacitracin BID, chlorhexidine cloth pads -PT/OT following  Homelessness, Unsafe Living Conditions -Appreciate TOC assistance with SNF placement   FEN/GI: Heart healthy PPx: Lovenox Dispo:SNF in 3 or more days. Barriers include homelessness, uninsured, alcohol use.   Subjective:  No acute events overnight. Patient reports some ongoing R foot pain but overall feels well. No other concerns at this time.  Objective: Temp:  [98.3 F (36.8 C)-98.5 F (36.9 C)] 98.5 F (36.9 C) (06/26 2245) Pulse Rate:  [76-82] 82 (06/26 2245) Resp:  [14-20] 20 (06/26 2245) BP: (113-117)/(83-88) 117/87 (06/26 2245) SpO2:  [97 %-100 %] 100 % (06/26 1548) Physical Exam: General: alert, resting comfortably, NAD Cardiovascular: RRR, normal S1/S2 without m/r/g Respiratory: normal effort Abdomen: soft, nontender Extremities: no peripheral edema, dressing in place on right lower extremity- clean/dry/intact.   Laboratory: Recent Labs  Lab 11/27/20 0322 11/28/20 0538  WBC 5.5 6.1  HGB 11.4* 11.9*  HCT 34.5* 35.5*  PLT 270 349   Recent Labs   Lab 11/27/20 0322 11/28/20 0538  NA 132* 130*  K 3.6 3.8  CL 98 96*  CO2 24 27  BUN 9 7  CREATININE 0.62 0.62  CALCIUM 7.9* 8.2*  GLUCOSE 104* 97     Imaging/Diagnostic Tests: No results found.   11/29/20, MD 12/03/2020, 6:20 AM PGY-1, [facility] Family Medicine FPTS Intern pager: [phone], text pages welcome

## 2020-12-03 NOTE — Progress Notes (Signed)
Physical Therapy Treatment Patient Details Name: Robert Frank MRN: [id number] DOB: [birth year] Today's Date: 12/03/2020    History of Present Illness Pt is a 55 year old man admitted on 11/22/20 after being found down covered in bodily fluids. + R LE cellulitis, sepsis, rhabdomyolosis, hyponatremia. PMH: alcohol use disorder, HTN, COPD, L eye blindness, tobacco use, CVA with residual R side weakness.    PT Comments    Pt supine in bed on arrival this session.  Pt continues to benefit from skilled rehab in a post acute setting as he continues to require mod-max assistance.  Pt was able to use RLE minimally to advance steps from bed to recliner about 4 ft away.  He continues to present with poor safety awareness.  Encouraged pt to sit in recliner x 1 hour before requesting back to bed with nursing staff.      Follow Up Recommendations  SNF;Supervision/Assistance - 24 hour     Equipment Recommendations  Other (comment) (TBA at next level of care.)    Recommendations for Other Services       Precautions / Restrictions Precautions Precautions: Fall Precaution Comments: HOH; Contact precautions Restrictions Weight Bearing Restrictions: Yes RLE Weight Bearing: Weight bearing as tolerated    Mobility  Bed Mobility Overal bed mobility: Needs Assistance Bed Mobility: Supine to Sit;Sit to Supine     Supine to sit: Min guard;HOB elevated     General bed mobility comments: min guard for bed mobility, cues for hand placement.  Increased time and commands to move hips to edge of bed to prepare to stand.    Transfers Overall transfer level: Needs assistance Equipment used: Rolling walker (2 wheeled) Transfers: Sit to/from Stand Sit to Stand: Mod assist;From elevated surface Stand pivot transfers: Mod assist;From elevated surface       General transfer comment: cues for hand placement, increased time for problem solving, modA for boosting into standing with strong posterior  bias.  Poor eccentric load sitting before he was backed completely to seated surface.  He presents with poor eccentric load.  Ambulation/Gait Ambulation/Gait assistance: +2 safety/equipment;Max assist Gait Distance (Feet): 4 Feet Assistive device: Rolling walker (2 wheeled) Gait Pattern/deviations: Step-to pattern;Trunk flexed;Decreased stance time - right;Decreased weight shift to right     General Gait Details: Pt with limited weight bearing on RLE but able to place some weight on his R foot to advance steps from bed to recliner.  He required max assistance +1.  Assistance to move RW and maintain standing.   Stairs             Wheelchair Mobility    Modified Rankin (Stroke Patients Only)       Balance Overall balance assessment: Needs assistance Sitting-balance support: No upper extremity supported;Feet unsupported Sitting balance-Leahy Scale: Fair       Standing balance-Leahy Scale: Poor                              Cognition Arousal/Alertness: Awake/alert Behavior During Therapy: Flat affect Overall Cognitive Status: Impaired/Different from baseline Area of Impairment: Memory;Following commands;Problem solving;Awareness;Safety/judgement                 Orientation Level: Disoriented to;Time   Memory: Decreased short-term memory Following Commands: Follows one step commands with increased time Safety/Judgement: Decreased awareness of safety;Decreased awareness of deficits Awareness: Emergent Problem Solving: Slow processing;Decreased initiation;Requires verbal cues;Difficulty sequencing;Requires tactile cues General Comments: Unsure of pt's basline, pt demonstrateds  poor initiation to task, needs incrased time to process cues and simple commands to sequence through transfer task      Exercises Other Exercises Other Exercises: HS stretch 2 x 15 sec.  Pt reports pain with attempt an increase in pressure so only able to perform light  stretching.  He presents with a knee flexion contracture around 25    General Comments        Pertinent Vitals/Pain Pain Assessment: Faces Faces Pain Scale: Hurts little more Pain Location: R LE Pain Descriptors / Indicators: Discomfort;Grimacing;Guarding;Moaning Pain Intervention(s): Monitored during session;Repositioned    Home Living                      Prior Function            PT Goals (current goals can now be found in the care plan section) Acute Rehab PT Goals Patient Stated Goal: to go to SNF for rehab and to heal wound Potential to Achieve Goals: Fair Additional Goals Additional Goal #1: Pt will acheive >/= -15 degrees R knee extension PROM. Progress towards PT goals: Progressing toward goals    Frequency    Min 2X/week      PT Plan Current plan remains appropriate    Co-evaluation              AM-PAC PT "6 Clicks" Mobility   Outcome Measure  Help needed turning from your back to your side while in a flat bed without using bedrails?: A Little Help needed moving from lying on your back to sitting on the side of a flat bed without using bedrails?: A Little Help needed moving to and from a bed to a chair (including a wheelchair)?: A Lot Help needed standing up from a chair using your arms (e.g., wheelchair or bedside chair)?: A Lot Help needed to walk in hospital room?: A Lot Help needed climbing 3-5 steps with a railing? : Total 6 Click Score: 13    End of Session Equipment Utilized During Treatment: Gait belt Activity Tolerance: Patient tolerated treatment well Patient left: with call bell/phone within reach;in chair;with chair alarm set (Did not recline chair due to knee flexion contracture.) Nurse Communication: Mobility status PT Visit Diagnosis: Muscle weakness (generalized) (M62.81);Difficulty in walking, not elsewhere classified (R26.2);Pain Pain - Right/Left: Right Pain - part of body: Ankle and joints of foot;Leg     Time:  1407-1430 PT Time Calculation (min) (ACUTE ONLY): 23 min  Charges:  $Gait Training: 8-22 mins $Therapeutic Activity: 8-22 mins                     Bonney Leitz , PTA Acute Rehabilitation Services Pager [phone] Office [phone]    Kaito Schulenburg Artis Delay 12/03/2020, 2:50 PM

## 2020-12-03 NOTE — Progress Notes (Signed)
CSW spoke with patient at bedside says he is agreeable to long term SNF placement if a bed offer is obtained. Patient states he cannot go to a shelter in his current condition. Patient states he has not had steady employment for at least 10 years. Patient reports he was denied for disability about three years ago. Patient denies any current income. CSW educated patient on tobacco and substance use policies at facilities and he states understanding. Patient did not have questions or concerns - CSW encouraged patient to reach out if needs arise.  Edwin Dada, MSW, LCSW Transitions of Care  Clinical Social Worker II [phone]

## 2020-12-03 NOTE — Plan of Care (Signed)

## 2020-12-04 NOTE — Progress Notes (Signed)
Family Medicine Teaching Service Daily Progress Note Intern Pager: [phone]  Patient name: Robert Frank Medical record number: [id number] Date of birth: [birth year] Age: 56 y.o. Gender: male  Primary Care Provider: Pcp, No Consultants: Wound care (s/o), PT/OT Code Status: Full  Pt Overview and Major Events to Date:  6/17: admitted  Robert Frank is a 55 y.o. male who presented with right foot pain and maggot infestation, found to have cellulitis. PMH significant for HTN, COPD, alcohol use, left eye blindness.  Assessment and Plan:  Patient medically stable for discharge to SNF.  R Lower Extremity Cellulitis Improving s/p 10 day course of antibiotics. Remains afebrile. Awaiting SNF placement. -Continue Tylenol 650mg  q6h scheduled -Tramadol 50mg  q6h prn -Wound care with Bacitracin, Mupirocin, Chlorhexidine pads -PT/OT following -Appreciate TOC assistance with SNF placement  HTN Chronic, stable. Had been normotensive over past several days. -Continue home Metoprolol 25mg  BID  Alcohol Use No evidence of withdrawal during this admission. -Daily thiamine, folic acid, MVI  COPD, tobacco use Chronic, stable. -Continue Duoneb q6h prn -Nicotine patch daily  Severe Protein Calorie Malnutrition -PROSource Plus supplement TID -Juven packet BID   FEN/GI: Heart healthy diet PPx: Lovenox Dispo:SNF in 3 or more days. Barriers include homelessness, uninsured.   Subjective:  No acute events overnight. Patient denies complaints this morning.  Objective: Temp:  [98 F (36.7 C)-99.5 F (37.5 C)] 99.5 F (37.5 C) (06/27 2219) Pulse Rate:  [70-80] 80 (06/27 2219) Resp:  [14-18] 18 (06/27 2219) BP: (110-128)/(79-90) 128/90 (06/27 2219) SpO2:  [100 %] 100 % (06/27 2219) Physical Exam: General: alert, resting comfortably, ND Cardiovascular: RRR, normal S1/S2 Respiratory: normal effort Abdomen: soft, nontender Extremities: Dressing in place on R lower extremity,  NVI  Laboratory: Recent Labs  Lab 11/28/20 0538 12/03/20 0629  WBC 6.1 6.2  HGB 11.9* 12.8*  HCT 35.5* 39.5  PLT 349 503*   Recent Labs  Lab 11/28/20 0538 12/03/20 0629  NA 130* 135  K 3.8 3.8  CL 96* 98  CO2 27 29  BUN 7 18  CREATININE 0.62 0.76  CALCIUM 8.2* 8.7*  GLUCOSE 97 92    Imaging/Diagnostic Tests: No results found.   12/05/20, MD 12/04/2020, 6:22 AM PGY-1, [facility] Family Medicine FPTS Intern pager: [phone], text pages welcome

## 2020-12-04 NOTE — Progress Notes (Signed)
CSW spoke with Clement Husbands of [employer] who states patient is assigned to [employer]. Joaine will notify Latoya of patient's need to be screened for Medicaid.  Edwin Dada, MSW, LCSW Transitions of Care  Clinical Social Worker II [phone]

## 2020-12-04 NOTE — Progress Notes (Signed)
Nutrition Follow-up  DOCUMENTATION CODES:   Severe malnutrition in context of chronic illness, Severe malnutrition in context of social or environmental circumstances  INTERVENTION:   -Continue Ensure Enlive po TID, each supplement provides 350 kcal and 20 grams of protein  -Continue 30 ml Prosource Plus TID, each supplement provides 100 kcals and 15 grams protein -Continue 1 packet Juven BID, each packet provides 95 calories, 2.5 grams of protein (collagen), and 9.8 grams of carbohydrate (3 grams sugar); also contains 7 grams of L-arginine and L-glutamine, 300 mg vitamin C, 15 mg vitamin E, 1.2 mcg vitamin B-12, 9.5 mg zinc, 200 mg calcium, and 1.5 g  Calcium Beta-hydroxy-Beta-methylbutyrate to support wound healing -Continue MVI with minerals daily  NUTRITION DIAGNOSIS:   Severe Malnutrition related to chronic illness, social / environmental circumstances as evidenced by percent weight loss, energy intake < or equal to 75% for > or equal to 1 month, mild fat depletion, mild muscle depletion.  Ongoing  GOAL:   Patient will meet greater than or equal to 90% of their needs  Progressing   MONITOR:   PO intake, Supplement acceptance, Labs, Weight trends, Skin, I & O's  REASON FOR ASSESSMENT:   Consult, Malnutrition Screening Tool Wound healing  ASSESSMENT:   55 yo male with a PMH of EtOH use disorder, HTN, COPD, L-eye blindness, tobacco use, and CVA with residual R-sided weakness who was found down covered in bodily fluids, positive for RLE cellulitis, rhabdomyolosis, and hyponatremia.  Reviewed I/O's: -1.1 L x 24 hours and -1.5 L since admission  UOP: 1.1 L x 24 hours  Pt remains with great appetite. Noted meal completion 80-100%. Pt continues to take supplements well.   Medications reviewed and include folic acid and thiamine.   Per TOC notes, pt medically stable for discharge and awaiting SNF placement.   Labs reviewed.  Diet Order:   Diet Order             Diet  Heart Room service appropriate? Yes with Assist; Fluid consistency: Thin  Diet effective now                   EDUCATION NEEDS:   Education needs have been addressed  Skin:  Skin Assessment: Skin Integrity Issues: Skin Integrity Issues:: Other (Comment) Other: R foot cellulitis, open  Last BM:  11/26/20  Height:   Ht Readings from Last 1 Encounters:  11/23/20 5\' 6"  (1.676 m)    Weight:   Wt Readings from Last 1 Encounters:  11/23/20 84.1 kg    Ideal Body Weight:  64.5 kg  BMI:  Body mass index is 29.93 kg/m.  Estimated Nutritional Needs:   Kcal:  2300-2500  Protein:  130-145 grams  Fluid:  >2 L    11/25/20, RD, LDN, CDCES Registered Dietitian II Certified Diabetes Care and Education Specialist Please refer to [facility] for RD and/or RD on-call/weekend/after hours pager

## 2020-12-05 LAB — CULTURE, BLOOD (ROUTINE X 2)
Culture: NO GROWTH
Special Requests: ADEQUATE

## 2020-12-05 MED ORDER — ACETAMINOPHEN 325 MG PO TABS
650.0000 mg | ORAL_TABLET | Freq: Four times a day (QID) | ORAL | Status: DC | PRN
  Administered 2020-12-08: 650 mg via ORAL

## 2020-12-05 NOTE — Progress Notes (Signed)
Occupational Therapy Treatment Patient Details Name: Robert Frank MRN: [id number] DOB: Apr 26, 1966 Today's Date: 12/05/2020    History of present illness Pt is a 55 year old man admitted on 11/22/20 after being found down covered in bodily fluids. + R LE cellulitis, sepsis, rhabdomyolosis, hyponatremia. PMH: alcohol use disorder, HTN, COPD, L eye blindness, tobacco use, CVA with residual R side weakness.   OT comments  Robert Frank is incrementally progressing towards his goals with better pain tolerance to bear weight through his BLE for functional ambulation. Pt was eating in bed upon arrival & declined bathing and dressing tasks however with encouragement agreeable to short ambulation and finishing his meal in the chair. Pt is min A +2 for physical assistance for short ambulation. Pt continues to benefit from continues OT acutely to progress function in all ADLs and mobility. D/c plan remains appropriate.    Follow Up Recommendations  SNF;Supervision/Assistance - 24 hour    Equipment Recommendations  None recommended by OT       Precautions / Restrictions Precautions Precautions: Fall Precaution Comments: HOH; Contact precautions Restrictions Weight Bearing Restrictions: Yes RLE Weight Bearing: Weight bearing as tolerated       Mobility Bed Mobility Overal bed mobility: Needs Assistance Bed Mobility: Supine to Sit     Supine to sit: HOB elevated;Supervision     General bed mobility comments: supervision for bed mobility, slight posterior LOB once sitting EOB, pt able to self correct    Transfers Overall transfer level: Needs assistance Equipment used: Rolling walker (2 wheeled) Transfers: Sit to/from Stand Sit to Stand: Min assist;+2 physical assistance;+2 safety/equipment;From elevated surface         General transfer comment: cues for hand placement, increased time fro processing, min A +2 for balance and weight shifting    Balance Overall balance assessment:  Needs assistance Sitting-balance support: No upper extremity supported;Feet unsupported Sitting balance-Leahy Scale: Fair     Standing balance support: Bilateral upper extremity supported Standing balance-Leahy Scale: Poor                             ADL either performed or assessed with clinical judgement   ADL Overall ADL's : Needs assistance/impaired Eating/Feeding: Set up;Bed level Eating/Feeding Details (indicate cue type and reason): pt laying in bed eating upon arrival; left sitting in chair finishing his meal                     Toilet Transfer: Minimal assistance;+2 for physical assistance;+2 for safety/equipment;RW;BSC Toilet Transfer Details (indicate cue type and reason): simulated to the chair         Functional mobility during ADLs: Minimal assistance;+2 for physical assistance;+2 for safety/equipment;Rolling walker General ADL Comments: min A +2 for functional ambulation short distance from chair>bed; declined need for dressing/bathing becuase he wanted to finishe his meal      Cognition Arousal/Alertness: Awake/alert Behavior During Therapy: Flat affect Overall Cognitive Status: Impaired/Different from baseline Area of Impairment: Memory;Following commands;Problem solving;Awareness;Safety/judgement                     Memory: Decreased short-term memory Following Commands: Follows one step commands with increased time Safety/Judgement: Decreased awareness of safety;Decreased awareness of deficits Awareness: Emergent Problem Solving: Slow processing;Decreased initiation;Requires verbal cues;Difficulty sequencing;Requires tactile cues General Comments: pt continues to requrie vc for sequencing and problem solving functional activity  General Comments pt itching the entire session - encouraged bathing and dressing however pt declined    Pertinent Vitals/ Pain       Pain Assessment: Faces Faces Pain Scale: Hurts a  little bit Pain Location: R LE Pain Descriptors / Indicators: Discomfort;Grimacing;Guarding;Moaning Pain Intervention(s): Monitored during session   Frequency  Min 2X/week        Progress Toward Goals  OT Goals(current goals can now be found in the care plan section)  Progress towards OT goals: Progressing toward goals  Acute Rehab OT Goals Patient Stated Goal: to go to SNF for rehab and to heal wound OT Goal Formulation: With patient Time For Goal Achievement: 12/07/20 Potential to Achieve Goals: Fair ADL Goals Pt Will Perform Grooming: with set-up;sitting Pt Will Perform Upper Body Dressing: with supervision;sitting Pt Will Perform Lower Body Dressing: with min assist;sitting/lateral leans Pt Will Transfer to Toilet: with min assist;with transfer board;bedside commode Pt Will Perform Toileting - Clothing Manipulation and hygiene: with supervision;sitting/lateral leans Additional ADL Goal #1: Pt will tolerate sitting EOB x 10 minutes as a precursor to ADL.  Plan Discharge plan remains appropriate       AM-PAC OT "6 Clicks" Daily Activity     Outcome Measure   Help from another person eating meals?: None Help from another person taking care of personal grooming?: A Little Help from another person toileting, which includes using toliet, bedpan, or urinal?: A Lot Help from another person bathing (including washing, rinsing, drying)?: A Lot Help from another person to put on and taking off regular upper body clothing?: A Little Help from another person to put on and taking off regular lower body clothing?: A Lot 6 Click Score: 16    End of Session Equipment Utilized During Treatment: Gait belt;Rolling walker (+2 tech)  OT Visit Diagnosis: Muscle weakness (generalized) (M62.81);Other symptoms and signs involving cognitive function   Activity Tolerance Patient tolerated treatment well   Patient Left in chair;with call bell/phone within reach;with chair alarm set    Nurse Communication Mobility status        Time: 1517-6160 OT Time Calculation (min): 10 min  Charges: OT General Charges $OT Visit: 1 Visit OT Treatments $Therapeutic Activity: 8-22 mins     Robert Frank A Robert Frank 12/05/2020, 2:53 PM

## 2020-12-05 NOTE — Progress Notes (Signed)
Family Medicine Teaching Service Daily Progress Note Intern Pager: [phone]  Patient name: Robert Frank Medical record number: [id number] Date of birth: 12-30-1965 Age: 55 y.o. Gender: male  Primary Care Provider: Pcp, No Consultants: wound care (s/o), PT/OT Code Status: Full  Pt Overview and Major Events to Date:  6/17: admitted 6/26: completed 10 day course of antibiotics  Robert Frank is a 55 y.o. male who presented with R foot cellulitis and maggot infestation. PMH significant for homelessness/poor living conditions, HTN, COPD, alcohol use, L eye blindness.  Assessment and Plan:  Patient medically stable for discharge to SNF.  R Foot Cellulitis Significantly improved from admission. Still appears denuded in several areas. Dressing clean/dry/intact. Awaiting SNF placement. -s/p antibiotics x10 days (vanc & cefepime, then PO doxy)  -Will change Tylenol from scheduled to prn -d/c Tramadol (had only required 1 dose over past several days) -PT/OT following -Wound care w/bacitracin, mupirocin, chlorhexidine pads  Severe Protein Calorie Malnutrition -Continue supplements (PROSource Plus TID and Juven packet BID)  Remainder of problems chronic and stable.   FEN/GI: Heart healthy diet PPx: Lovenox Dispo:SNF in 3 or more days. Barriers include uninsured, homeless, no income.   Subjective:  No acute events overnight. Patient without complaints this morning.  Objective: Temp:  [98 F (36.7 C)-98.6 F (37 C)] 98 F (36.7 C) (06/29 0454) Pulse Rate:  [62-80] 62 (06/29 0454) Resp:  [16-20] 20 (06/29 0454) BP: (106-119)/(68-88) 115/81 (06/29 0454) SpO2:  [99 %-100 %] 100 % (06/29 0454) Physical Exam: General: alert, resting comfortably, NAD Cardiovascular: RRR, normal S1/S2 Respiratory: normal effort Abdomen: nontender Extremities: dressing clean/dry/intact on R lower extremity, chronic deformities of R toes, several denuded areas on R foot but no streaking  erythema or warmth  Laboratory: Recent Labs  Lab 12/03/20 0629  WBC 6.2  HGB 12.8*  HCT 39.5  PLT 503*   Recent Labs  Lab 12/03/20 0629  NA 135  K 3.8  CL 98  CO2 29  BUN 18  CREATININE 0.76  CALCIUM 8.7*  GLUCOSE 92     Imaging/Diagnostic Tests: No results found.   Robert Dus, MD 12/05/2020, 7:36 AM PGY-1, [facility] Family Medicine FPTS Intern pager: [phone], text pages welcome

## 2020-12-06 MED ORDER — ATORVASTATIN CALCIUM 10 MG PO TABS
20.0000 mg | ORAL_TABLET | Freq: Every day | ORAL | Status: DC
  Administered 2020-12-06 – 2020-12-18 (×13): 20 mg via ORAL
  Filled 2020-12-06 (×13): qty 2

## 2020-12-06 MED ORDER — VITAMIN B-12 1000 MCG PO TABS
1000.0000 ug | ORAL_TABLET | Freq: Every day | ORAL | Status: DC
  Administered 2020-12-07 – 2020-12-18 (×12): 1000 ug via ORAL
  Filled 2020-12-06 (×13): qty 1

## 2020-12-06 MED ORDER — DIPHENHYDRAMINE HCL 25 MG PO CAPS
25.0000 mg | ORAL_CAPSULE | Freq: Once | ORAL | Status: AC
  Administered 2020-12-06: 25 mg via ORAL
  Filled 2020-12-06: qty 1

## 2020-12-06 NOTE — Progress Notes (Signed)
Family Medicine Teaching Service Daily Progress Note Intern Pager: [phone]  Patient name: Robert Frank Medical record number: [id number] Date of birth: 1966-04-12 Age: 55 y.o. Gender: male  Primary Care Provider: Pcp, No Consultants: PT/OT Code Status: Full  Pt Overview and Major Events to Date:  6/17: admitted 6/26: completed 10 day course of antibiotics  Robert Frank is a 55 y.o. male who presented with R foot cellulitis and maggot infestation. PMH significant for HTN, COPD, alcohol use, L eye blindness, and homelessness/poor living conditions.  Assessment and Plan:  Patient medically stable for discharge to SNF.  R Foot Cellulitis  Poor Living Conditions Significantly improved from admission. Dressing clean/dry/intact on R foot. Slight denuding and chronic deformities of toes, but no warmth or streaking erythema. Has remained afebrile. -Tylenol 650mg  q6h prn -Dressing changes qshift -Wound care with bacitracin BID, vaseline or xeroform gauze, & kerlex -PT/OT -Appreciate TOC assistance with Medicaid and SNF placement  Severe Protein Calorie Malnutrition -Ensure supplement TID -PROSource supplement TID -Juven suplement BID  HTN Normotensive throughout admission. -Continue metoprolol 25mg  BID  Alcohol Use No evidence of withdrawal during admission -Continue daily thiamine, folic acid, multivitamin  COPD, tobacco use Chronic, stable. -Duonebs q6h prn -Robitussin q6h prn for cough -Nicotine patch 14mg  daily  FEN/GI: Heart healthy diet PPx: Lovenox Dispo:SNF in 3 or more days. Barriers include uninsured, homelessness.   Subjective:  Patient's IV removed overnight. Not replaced. He also reports some itching on his back and at the IV site, which resolved with Benadryl. No significant foot pain. No other complaints.   Objective: Temp:  [98.1 F (36.7 C)-98.4 F (36.9 C)] 98.4 F (36.9 C) (06/30 0201) Pulse Rate:  [68-79] 76 (06/30 0201) Resp:  [16] 16  (06/30 0201) BP: (120-137)/(85-90) 137/89 (06/30 0201) SpO2:  [98 %-100 %] 98 % (06/30 0201) Physical Exam: General: alert, resting comfortably in bed, NAD Cardiovascular: RRR, normal S1/S2 Respiratory: normal effort Abdomen: soft, nontender Extremities: Dressing clean/dry/intact on R foot. Neurovascularly intact distally. Areas of denuded skin appear to be healing appropriately. Chronic deformities of toes bilaterally. Venous stasis changes bilaterally. No increased warmth.  Laboratory: Recent Labs  Lab 12/03/20 0629  WBC 6.2  HGB 12.8*  HCT 39.5  PLT 503*   Recent Labs  Lab 12/03/20 0629  NA 135  K 3.8  CL 98  CO2 29  BUN 18  CREATININE 0.76  CALCIUM 8.7*  GLUCOSE 92     Imaging/Diagnostic Tests: No results found.   03-13-1984, MD 12/06/2020, 7:32 AM PGY-1, [facility] Family Medicine FPTS Intern pager: [phone], text pages welcome

## 2020-12-06 NOTE — Progress Notes (Signed)
Physical Therapy Treatment Patient Details Name: Robert Frank MRN: [id number] DOB: 08/22/1965 Today's Date: 12/06/2020    History of Present Illness Pt is a 55 year old man admitted on 11/22/20 after being found down covered in bodily fluids. + R LE cellulitis, sepsis, rhabdomyolosis, hyponatremia. PMH: alcohol use disorder, HTN, COPD, L eye blindness, tobacco use, CVA with residual R side weakness.    PT Comments    Pt agreeable to get up to [facility] for BM. Initially attempting to rise with RW, however pt would rise part way, sit back down and push RW away. For safety ended up transferring via face to face to [facility]. Pt repeated same pattern when transferring from [facility] to recliner chair. Will continue to follow acutely for mobility progression.    Follow Up Recommendations  SNF;Supervision/Assistance - 24 hour     Equipment Recommendations  Other (comment) (TBA at next level of care.)    Recommendations for Other Services       Precautions / Restrictions Precautions Precautions: Fall Precaution Comments: HOH; Contact precautions    Mobility  Bed Mobility Overal bed mobility: Needs Assistance Bed Mobility: Supine to Sit     Supine to sit: HOB elevated;Min assist     General bed mobility comments: min A to elevate trunk    Transfers Overall transfer level: Needs assistance   Transfers: Sit to/from Stand Sit to Stand: Mod assist Stand pivot transfers: Mod assist       General transfer comment: attempted to use RW. Pt would rise part way, sit down and push the RW away from him. Then attempt more of a lateral transfer but arm rests were in the way. PTA assisted with transfer via face to face. from bed>BSC>recliner chair.  Ambulation/Gait                 Stairs             Wheelchair Mobility    Modified Rankin (Stroke Patients Only)       Balance Overall balance assessment: Needs assistance Sitting-balance support: No upper extremity  supported;Feet unsupported Sitting balance-Leahy Scale: Fair     Standing balance support: Bilateral upper extremity supported Standing balance-Leahy Scale: Poor                              Cognition Arousal/Alertness: Awake/alert Behavior During Therapy: Flat affect Overall Cognitive Status: Impaired/Different from baseline Area of Impairment: Memory;Following commands;Problem solving;Awareness;Safety/judgement                     Memory: Decreased short-term memory Following Commands: Follows one step commands with increased time Safety/Judgement: Decreased awareness of safety;Decreased awareness of deficits Awareness: Emergent Problem Solving: Slow processing;Decreased initiation;Requires verbal cues;Difficulty sequencing;Requires tactile cues General Comments: Pt initially not verbalizing, but became more vocal as session progressed. Slightly impulsive and unsafe with mobility, as he would push the RW away from him. Unable to understand why TV needed to be muted.      Exercises      General Comments General comments (skin integrity, edema, etc.): Pt had large BM on BSC. Able to perform peri care seated w/o assist      Pertinent Vitals/Pain Pain Assessment: Faces Faces Pain Scale: Hurts a little bit Pain Location: R LE Pain Descriptors / Indicators: Discomfort;Grimacing;Guarding;Moaning Pain Intervention(s): Monitored during session;Limited activity within patient's tolerance;Repositioned    Home Living  Prior Function            PT Goals (current goals can now be found in the care plan section) Acute Rehab PT Goals Patient Stated Goal: to go to SNF for rehab and to heal wound PT Goal Formulation: With patient Time For Goal Achievement: 12/07/20 Potential to Achieve Goals: Fair    Frequency    Min 2X/week      PT Plan Current plan remains appropriate    Co-evaluation              AM-PAC PT "6  Clicks" Mobility   Outcome Measure  Help needed turning from your back to your side while in a flat bed without using bedrails?: A Little Help needed moving from lying on your back to sitting on the side of a flat bed without using bedrails?: A Little Help needed moving to and from a bed to a chair (including a wheelchair)?: A Lot Help needed standing up from a chair using your arms (e.g., wheelchair or bedside chair)?: A Lot Help needed to walk in hospital room?: A Lot Help needed climbing 3-5 steps with a railing? : Total 6 Click Score: 13    End of Session Equipment Utilized During Treatment: Gait belt Activity Tolerance: Patient tolerated treatment well Patient left: with call bell/phone within reach;in chair;with chair alarm set (Did not recline chair due to knee flexion contracture.) Nurse Communication: Mobility status PT Visit Diagnosis: Muscle weakness (generalized) (M62.81);Difficulty in walking, not elsewhere classified (R26.2);Pain Pain - Right/Left: Right Pain - part of body: Ankle and joints of foot;Leg     Time: 0086-7619 PT Time Calculation (min) (ACUTE ONLY): 34 min  Charges:  $Therapeutic Activity: 23-37 mins                    Kallie Locks, [place] Pager [phone] Acute Rehab  Sheral Apley 12/06/2020, 3:23 PM

## 2020-12-06 NOTE — Progress Notes (Signed)
MD on call paged that patient is having some itching and scratched his IV site out inquired if IV can be left out due to no medications of fluids running, Will continue to monitor. Ilean Skill LPN

## 2020-12-06 NOTE — Progress Notes (Signed)
New orders received to discontinue IV and once dose benadryl 25 mg po. Ilean Skill LPN

## 2020-12-07 DIAGNOSIS — M2011 Hallux valgus (acquired), right foot: Secondary | ICD-10-CM

## 2020-12-07 MED ORDER — UMECLIDINIUM BROMIDE 62.5 MCG/INH IN AEPB
1.0000 | INHALATION_SPRAY | Freq: Every day | RESPIRATORY_TRACT | Status: DC
  Administered 2020-12-08 – 2020-12-18 (×8): 1 via RESPIRATORY_TRACT
  Filled 2020-12-07 (×2): qty 7

## 2020-12-07 MED ORDER — ACETAMINOPHEN 325 MG PO TABS
650.0000 mg | ORAL_TABLET | Freq: Once | ORAL | Status: DC
  Filled 2020-12-07: qty 2

## 2020-12-07 NOTE — Progress Notes (Signed)
Family Medicine Teaching Service Daily Progress Note Intern Pager: [phone]  Patient name: JERRIC OYEN Medical record number: [id number] Date of birth: [birth year] Age: 55 y.o. Gender: male  Primary Care Provider: Pcp, No Consultants: PT/OT Code Status: Full  Pt Overview and Major Events to Date:  9/17 - Admitted 6/26 - completed 10 day course of antibiotics  Assessment and Plan:  Patient medically stable for discharge to SNF  R foot cellulitis Dressing intact on R foot.  He remains afebrile. -Tylenol 650 mg qh6 prn -Dressing changes qshift -Wound care with bacitracin BID, Vaseline or xeroform gauze and kerlex -PT/OT    FEN/GI: Heart healthy diet PPx: Lovenox Dispo:SNF   Subjective:  Pt's IV was replaced yesterday after he has accidentally scratched it out.  He was sleeping and stated he feels tired.  No significant foot pain.    Objective: Temp:  [98.1 F (36.7 C)-98.7 F (37.1 C)] 98.2 F (36.8 C) (07/01 0840) Pulse Rate:  [71-80] 71 (07/01 0840) Resp:  [14-16] 14 (07/01 0840) BP: (114-137)/(83-95) 115/87 (07/01 0840) SpO2:  [97 %-100 %] 99 % (07/01 0840) Physical Exam: General: Sleeping, resting comfortably in bed, NAD Cardiovascular: RRR, normal S1 and S2 Respiratory: no difficulty breathing Extremities: Dressing intact on R foot, no LE edema  Laboratory: Recent Labs  Lab 12/03/20 0629  WBC 6.2  HGB 12.8*  HCT 39.5  PLT 503*   Recent Labs  Lab 12/03/20 0629  NA 135  K 3.8  CL 98  CO2 29  BUN 18  CREATININE 0.76  CALCIUM 8.7*  GLUCOSE 92      Erick Alley, DO 12/07/2020, 11:39 AM PGY-1, [facility] FPTS Intern pager: [phone], text pages welcome

## 2020-12-08 NOTE — Progress Notes (Addendum)
Pt's right foot dressing changed per order. Pt's foot has no open areas and no drainage/bleeding. Area discolored red and flaking skin. Tender to touch. Overall looks much better from last time this nurse changed dressing on June 24th.

## 2020-12-08 NOTE — Progress Notes (Signed)
Family Medicine Teaching Service Daily Progress Note Intern Pager: [phone]  Patient name: Robert Frank Medical record number: [id number] Date of birth: 1965-11-11 Age: 55 y.o. Gender: male  Primary Care Provider: Pcp, No Consultants: PT/OT Code Status: Full  Pt Overview and Major Events to Date:  6/17- Admitted 6/26- Completed 10 day course of antibiotics   Assessment and Plan:  Robert Frank is a 55  y.o. male who presents with R foot cellulites associated with maggot infestation. PMH is significant for HTN, COPD, Malnourishment, Alcohol use, L eye Blindness and  Homelessness  Patient is medically stable for discharge to SNF   R Foot Cellulitis - Tylenol 650mg  q6h prn -PT/OT -Wound care qshift - Wound care with Bacitracin/ Vaseline or Xeroform Guaze  Protein Malnourishment - Multivitamin Suplement - Ensure Supplement TID - Juven Powder BID  HTN - Continue Metoprolol 25mg  BID  COPD - Continue inhaler (Umeclidinium Bromide)   FEN/GI: Heart health diet PPx: Lovenox Dispo:SNF.   Subjective:  Patient said he is doing okay. He has normal bowel movement.  Patient said he has no itchiness and his pain is 3 out of 10 on his right foot.  Objective: Temp:  [98.2 F (36.8 C)-98.9 F (37.2 C)] 98.9 F (37.2 C) (07/01 2243) Pulse Rate:  [71-79] 78 (07/01 2243) Resp:  [14-17] 17 (07/01 2243) BP: (115-143)/(87-94) 143/94 (07/01 2243) SpO2:  [95 %-100 %] 95 % (07/01 2243) Physical Exam: General: Resting comfortably in bed with no discomfort. Cardiovascular: Normal heart rate, no murmur, normal S1/S2. Respiratory: Clear breath sound bilaterally, no wheezing. Abdomen: Normal abdomen, no distention, no tenderness. Extremities: Right foot dressing looks dry/clean.  No edema bilaterally  Laboratory: Recent Labs  Lab 12/03/20 0629  WBC 6.2  HGB 12.8*  HCT 39.5  PLT 503*   Recent Labs  Lab 12/03/20 0629  NA 135  K 3.8  CL 98  CO2 29  BUN 18  CREATININE  0.76  CALCIUM 8.7*  GLUCOSE 92     12/05/20, MD 12/08/2020, 4:51 AM PGY-1, [facility] Family Medicine FPTS Intern pager: [phone], text pages welcome

## 2020-12-08 NOTE — Progress Notes (Signed)
FPTS Brief Progress Note  S: Reported to bedside in order to evaluate patient and discuss current treatment plan with RN.  Patient was sleeping comfortably at time of visit.  Discussed updates/any nursing needs with patient's RN, Manson Passey.  RN states that patient has no new complaints or medical needs at this time.  She reports change in wound dressing with twice daily bacitracin application.   O: BP (!) 143/94 (BP Location: Right Arm)   Pulse 78   Temp 98.9 F (37.2 C) (Oral)   Resp 17   Ht 5\' 6"  (1.676 m)   Wt 84.1 kg   SpO2 95%   BMI 29.93 kg/m   General: Male resting comfortably in bed in no acute distress, sleeping  A/P: Patient is a 55 year old male admitted for right foot and lower extremity cellulitis, improved.  Patient is currently awaiting placement and assistance with insurance enrollment in order to move to SNF. - Orders reviewed. Labs for AM not ordered, which was adjusted as needed.    40, MD 12/08/2020, 2:44 AM PGY-3,  Family Medicine Night Resident  Please page [phone] with questions.

## 2020-12-08 NOTE — Progress Notes (Signed)
Patient was screened for Medicaid benefits and unfortunately no application was submitted for the patient due to him not having a disability that will prohibit him from returning to work for at least 12 months. Patient lives in a home he inherited from his deceased mother - the home does not have electricity or running water. APS is currently involved due to the poor living conditions. Patient may be eligible for 30 day LOG at SNF if approved by [facility] Director - CSW will follow up with [facility] Director upon his return to work following the holiday weekend.  Edwin Dada, MSW, LCSW Transitions of Care  Clinical Social Worker II [phone]

## 2020-12-08 NOTE — Progress Notes (Signed)
FPTS Night Rounding Progress Note  S:Went to bedside to check on patient. He was resting comfortably, denies any concerns at this time. Reports that his right foot still feels sore and he recovered from a headache earlier. Per nurse, no concerns at this time.   O: BP 121/64 (BP Location: Right Arm)   Pulse 76   Temp 97.8 F (36.6 C) (Oral)   Resp 17   Ht 5\' 6"  (1.676 m)   Wt 84.1 kg   SpO2 98%   BMI 29.93 kg/m   General: Patient sitting upright watching tv, in no acute distress. Resp: normal work of breathing noted Ext: right foot wrapped in clean and sterile dressing without saturations   A/P: Mr. Wease is a 55 yr old male admitted for right foot cellulitis. Currently medically stable, he remains in the hospital pending issues surrounding disposition.  -continue current plan in place, next labs planned for 7/25 which are ordered  8/25, DO 12/08/2020, 9:49 PM PGY-2, [facility] Family Medicine Service pager [phone]

## 2020-12-08 NOTE — Plan of Care (Signed)
  Problem: Clinical Measurements: Goal: Will remain free from infection Outcome: Progressing   Problem: Activity: Goal: Risk for activity intolerance will decrease Outcome: Not Progressing   Problem: Skin Integrity: Goal: Risk for impaired skin integrity will decrease Outcome: Progressing

## 2020-12-08 NOTE — Plan of Care (Signed)

## 2020-12-09 DIAGNOSIS — R278 Other lack of coordination: Secondary | ICD-10-CM

## 2020-12-09 MED ORDER — SENNA 8.6 MG PO TABS
1.0000 | ORAL_TABLET | Freq: Every day | ORAL | Status: DC
  Administered 2020-12-09 – 2020-12-18 (×10): 8.6 mg via ORAL
  Filled 2020-12-09 (×10): qty 1

## 2020-12-09 MED ORDER — POLYETHYLENE GLYCOL 3350 17 G PO PACK
17.0000 g | PACK | Freq: Every day | ORAL | Status: DC
  Administered 2020-12-09 – 2020-12-18 (×9): 17 g via ORAL
  Filled 2020-12-09 (×9): qty 1

## 2020-12-09 NOTE — Progress Notes (Addendum)
Family Medicine Teaching Service Daily Progress Note Intern Pager: [phone]  Patient name: Robert Frank Medical record number: [id number] Date of birth: [birth year] Age: 55 y.o. Gender: male  Primary Care Provider: Pcp, No Consultants: None Code Status: Full  Pt Overview and Major Events to Date:  Admitted: 6/17 Completed 10 day antibiotics course: 6/26  Assessment and Plan: Robert Frank. Vanderheiden is a 55 y.o. male who presents with right foot cellulitis associated with a maggot infestation. PMHx significant for hypertension, COPD, chronic alcohol use, left eye blindness, homelessness and malnourishment.  Patient is medically stable for discharge to SNF, pending placement.   Right foot cellulitis Stable.  -continue tylenol 650 mg q6h prn -appropriate wound care with bacitracin -plan for labs 7/4 -PT/OT  Protein malnourishment -continue multivitamin supplement -Ensure supplement TID -Juven powder BID  Hypertension Chronic and stable. Normotensive this morning at 121/64. -continue metoprolol 25 mg bid  COPD -continue umeclidinium bromide inhaler   Chronic alcoholism -continue thiamine  -continue folic acid    FEN/GI: heart healthy diet  PPx: lovenox  Dispo:SNF in 2-3 days. Barriers include pending SNF placement.   Subjective:  No acute overnight events. Endorsing mild, intermittent right foot soreness but otherwise pain well-controlled under current regimen of tylenol. Reports that he would like chocolate ensure and a soda which I then conveyed to the nurse. Drinking nutrition supplements.   Objective: Temp:  [97.8 F (36.6 C)-98.5 F (36.9 C)] 97.8 F (36.6 C) (07/02 1956) Pulse Rate:  [68-76] 76 (07/02 1956) Resp:  [17-19] 17 (07/02 1956) BP: (118-123)/(64-84) 121/64 (07/02 1956) SpO2:  [95 %-100 %] 98 % (07/02 1956) Physical Exam: General: Patient sitting upright in bed, watching tv, in no acute distress. Cardiovascular: RRR, no murmurs or gallops  auscultated  Respiratory: CTAB, breathing comfortably on room air Abdomen: soft, nontender, nondistended, presence of bowel sounds Extremities: no LE edema noted bilaterally, right foot covered in new clean and dry dressing, radial and distal pulses strong and equal bilaterally   Laboratory: Recent Labs  Lab 12/03/20 0629  WBC 6.2  HGB 12.8*  HCT 39.5  PLT 503*   Recent Labs  Lab 12/03/20 0629  NA 135  K 3.8  CL 98  CO2 29  BUN 18  CREATININE 0.76  CALCIUM 8.7*  GLUCOSE 92      Imaging/Diagnostic Tests: No results found.   Reece Leader, DO 12/09/2020, 5:46 AM PGY-2, [place] Family Medicine FPTS Intern pager: [phone], text pages welcome

## 2020-12-09 NOTE — TOC Progression Note (Addendum)
Transition of Care [facility]) - Progression Note    Patient Details  Name: Robert Frank MRN: [id number] Date of Birth: 13-Apr-1966  Transition of [facility]) CM/SW Contact  Levada Schilling Phone Number: 12/09/2020, 3:35 PM  Clinical Narrative:     CSW was consulted to assist with medicaid application for pt.  CSW  updated physician that pt was on difficult to place list and  CSW Edwin Dada is the contact for pt.  CSW Okey Regal wrote a note on Saturday, 12/08/20 with updated information concerning pt's medicaid.       Expected Discharge Plan and Services                                                 Social Determinants of Health (SDOH) Interventions    Readmission Risk Interventions Readmission Risk Prevention Plan 12/19/2019  Transportation Screening Complete  PCP or Specialist Appt within 5-7 Days Complete  Home Care Screening Complete  Medication Review (RN CM) Complete  Some recent data might be hidden

## 2020-12-09 NOTE — Plan of Care (Signed)
  Problem: Clinical Measurements: Goal: Will remain free from infection Outcome: Progressing   Problem: Activity: Goal: Risk for activity intolerance will decrease Outcome: Progressing   

## 2020-12-10 LAB — CBC
HCT: 40.8 % (ref 39.0–52.0)
Hemoglobin: 13.4 g/dL (ref 13.0–17.0)
MCH: 33.3 pg (ref 26.0–34.0)
MCHC: 32.8 g/dL (ref 30.0–36.0)
MCV: 101.2 fL — ABNORMAL HIGH (ref 80.0–100.0)
Platelets: 311 K/uL (ref 150–400)
RBC: 4.03 MIL/uL — ABNORMAL LOW (ref 4.22–5.81)
RDW: 13.2 % (ref 11.5–15.5)
WBC: 6.8 K/uL (ref 4.0–10.5)
nRBC: 0 % (ref 0.0–0.2)

## 2020-12-10 LAB — BASIC METABOLIC PANEL WITH GFR
Anion gap: 8 (ref 5–15)
BUN: 26 mg/dL — ABNORMAL HIGH (ref 6–20)
CO2: 25 mmol/L (ref 22–32)
Calcium: 8.8 mg/dL — ABNORMAL LOW (ref 8.9–10.3)
Chloride: 103 mmol/L (ref 98–111)
Creatinine, Ser: 0.78 mg/dL (ref 0.61–1.24)
GFR, Estimated: >60 mL/min (ref >60)
Glucose, Bld: 95 mg/dL (ref 70–99)
Potassium: 4.1 mmol/L (ref 3.5–5.1)
Sodium: 136 mmol/L (ref 135–145)

## 2020-12-10 NOTE — Progress Notes (Signed)
FPTS Brief Progress Note  S:Spoke with nurse who states patient is sleeping comfortably and has no current complaints.   O: BP 122/75 (BP Location: Right Arm)   Pulse 84   Temp 98.4 F (36.9 C) (Oral)   Resp 20   Ht 5\' 6"  (1.676 m)   Wt 84.1 kg   SpO2 100%   BMI 29.93 kg/m     A/P: Right foot cellulitis -continue wound care as outlined by am progress note  - Orders reviewed. Labs for AM ordered, which was adjusted as needed.    , DO 12/10/2020, 2:01 AM PGY-1, [place] Family Medicine Night Resident  Please page [phone] with questions.

## 2020-12-10 NOTE — Progress Notes (Addendum)
CSW met with patient at bedside to inform him of information obtained from financial counselor regarding patient's inability to be approved for Medicaid due to his lack of disability. CSW informed patient that an attempt would be made to get an LOG from [employer] but that it wasn't certain - he stated understanding. Patient confirms he has a wheelchair at home but when asked if it was useable, he shrugged his shoulders. CSW informed patient that he would provide Chauncey of APS with an update. CSW obtained permission from patient to contact his pastor, Richardson Landry.  CSW spoke with Richardson Landry at [phone] - Richardson Landry confirms he has visited with the patient during his hospitalization. Richardson Landry confirms patient's home does not have electricity or running water. Richardson Landry suspects the home has black mold due to the presence of a black substance on the walls. Richardson Landry states the [place] has failed to condemn the home due to it being occupied by the patient and his brother. Richardson Landry states he provides food to the patient and his brother weekly. Richardson Landry states he cannot provide the patient with transportation but is agreeable to continue checking on him frequently.  Madilyn Fireman, MSW, LCSW Transitions of Care  Clinical Social Worker II [phone]

## 2020-12-10 NOTE — Progress Notes (Signed)
Occupational Therapy Treatment Patient Details Name: Robert Frank MRN: [id number] DOB: [birth year] Today's Date: 12/10/2020    History of present illness Pt is a 55 year old man admitted on 11/22/20 after being found down covered in bodily fluids. + R LE cellulitis, sepsis, rhabdomyolosis, hyponatremia. PMH: alcohol use disorder, HTN, COPD, L eye blindness, tobacco use, CVA with residual R side weakness.   OT comments  Robert Frank is progressing towards his updated goals. Pt required min A for bed mobility. And completed ADLs at bed level with min A overall. Pt declined attempt to sit<>stand with rw. Instead, performed lateral scoot with min A from bed>chair. Throughout session pt continues to require increased time and simple verbal cues for all problem solving and sequencing. Pt benefits from continued TO acutely. D/c plan remains appropriate.    Follow Up Recommendations  SNF;Supervision/Assistance - 24 hour    Equipment Recommendations  None recommended by OT       Precautions / Restrictions Precautions Precautions: Fall Precaution Comments: HOH; Contact precautions Restrictions Weight Bearing Restrictions: Yes RLE Weight Bearing: Weight bearing as tolerated       Mobility Bed Mobility Overal bed mobility: Needs Assistance Bed Mobility: Supine to Sit     Supine to sit: HOB elevated;Min assist     General bed mobility comments: continues to require assist for elevate trunk    Transfers Overall transfer level: Needs assistance Equipment used: 1 person hand held assist Transfers: Lateral/Scoot Transfers          Lateral/Scoot Transfers: Min assist General transfer comment: pt declined sit<>stand with rw. pt performed lateral scoot transfer from bed>chair given min A    Balance Overall balance assessment: Needs assistance Sitting-balance support: No upper extremity supported;Feet unsupported Sitting balance-Robert Frank Scale: Fair       Standing balance-Robert Frank Scale:  Poor                             ADL either performed or assessed with clinical judgement   ADL Overall ADL's : Needs assistance/impaired         Upper Body Bathing: Set up;Sitting   Lower Body Bathing: Minimal assistance;Bed level Lower Body Bathing Details (indicate cue type and reason): pt completed lower body bathing at bed level with rolling due to pain in standing. requried min A for bilat distal LE Upper Body Dressing : Set up;Sitting       Toilet Transfer: Minimal assistance Toilet Transfer Details (indicate cue type and reason): simulated to chiar         Functional mobility during ADLs: Minimal assistance;Cueing for safety General ADL Comments: pt requried incrased time and cueing for all ADLs      Cognition Arousal/Alertness: Awake/alert Behavior During Therapy: Flat affect Overall Cognitive Status: Impaired/Different from baseline Area of Impairment: Safety/judgement;Problem solving                         Safety/Judgement: Decreased awareness of safety;Decreased awareness of deficits   Problem Solving: Slow processing;Decreased initiation;Difficulty sequencing;Requires verbal cues                General Comments VSS on RA    Pertinent Vitals/ Pain       Pain Assessment: Faces Faces Pain Scale: Hurts a little bit Pain Location: R LE Pain Descriptors / Indicators: Discomfort;Grimacing;Guarding;Moaning Pain Intervention(s): Monitored during session   Frequency  Min 2X/week        Progress  Toward Goals  OT Goals(current goals can now be found in the care plan section)  Progress towards OT goals: Progressing toward goals  Acute Rehab OT Goals Patient Stated Goal: to go to SNF for rehab and to heal wound OT Goal Formulation: With patient Time For Goal Achievement: 12/21/20 Potential to Achieve Goals: Fair ADL Goals Pt Will Perform Grooming: with modified independence;sitting Pt Will Perform Upper Body Dressing:  with modified independence;sitting Pt Will Perform Lower Body Dressing: with min guard assist;sit to/from stand Pt Will Transfer to Toilet: with modified independence;stand pivot transfer;bedside commode Pt Will Perform Toileting - Clothing Manipulation and hygiene: with supervision;sitting/lateral leans  Plan Discharge plan remains appropriate       AM-PAC OT "6 Clicks" Daily Activity     Outcome Measure   Help from another person eating meals?: None Help from another person taking care of personal grooming?: A Little Help from another person toileting, which includes using toliet, bedpan, or urinal?: A Lot Help from another person bathing (including washing, rinsing, drying)?: A Lot Help from another person to put on and taking off regular upper body clothing?: A Little Help from another person to put on and taking off regular lower body clothing?: A Lot 6 Click Score: 16    End of Session Equipment Utilized During Treatment: Gait belt  OT Visit Diagnosis: Muscle weakness (generalized) (M62.81);Other symptoms and signs involving cognitive function   Activity Tolerance Patient tolerated treatment well   Patient Left in chair;with call bell/phone within reach;with chair alarm set   Nurse Communication Mobility status        Time: 5400-8676 OT Time Calculation (min): 17 min  Charges: OT General Charges $OT Visit: 1 Visit OT Treatments $Self Care/Home Management : 8-22 mins     Jaleiyah Alas A Taquana Bartley 12/10/2020, 2:00 PM

## 2020-12-10 NOTE — Plan of Care (Signed)
  Problem: Education: Goal: Knowledge of General Education information will improve Description: Including pain rating scale, medication(s)/side effects and non-pharmacologic comfort measures Outcome: Progressing   Problem: Activity: Goal: Risk for activity intolerance will decrease Outcome: Progressing   Problem: Elimination: Goal: Will not experience complications related to bowel motility Outcome: Progressing   

## 2020-12-10 NOTE — Progress Notes (Addendum)
Family Medicine Teaching Service Daily Progress Note Intern Pager: [phone]  Patient name: AVONTAE BURKHEAD Medical record number: [id number] Date of birth: [birth year] Age: 55 y.o. Gender: male  Primary Care Provider: Pcp, No Consultants: None Code Status: Full  Pt Overview and Major Events to Date:  Admitted: 6/17 Completed 10 days of antibiotic course: 6/26  Assessment and Plan:  JEVANTE HOLLIBAUGH is a 55 year old male presents with right foot cellulitis associated with mild infestation of maggots.  PMH significant for hypertension, COPD, chronic alcohol use, left eye blindness, malnourishment and homelessness.  Patient is medically stable for discharge to SNF, pending placement  Right Foot cellulitis Stable with minimal pain -Continue Tylenol 650 mg every 6 hours as needed -Continue wound care with bacitracin -PT/OT  Protein malnourishment -Continue multivitamin supplement -Ensure supplements TID -Juven powder BID  Hypertension Stable.  Normotensive in the last 24 hours with most recent BP 125/84 at AM  COPD -Continue Umeclidinium bromide inhaler  Chronic alcoholism -Continue thiamine -Continue folic acid  Constipation -Continue patient on MiraLAX  FEN/GI: Heart healthy diet  PPx: Lovenox Dispo:SNF . Barriers include pending placement.   Subjective:  Patient said he is overall doing well, complains of pain on the right foot.  Pain is about the same and no changes, still unable to put pressure on the foot.  Objective: Temp:  [98.1 F (36.7 C)-98.4 F (36.9 C)] 98.1 F (36.7 C) (07/04 0802) Pulse Rate:  [79-84] 79 (07/04 0802) Resp:  [16-20] 16 (07/04 0802) BP: (110-125)/(75-85) 125/84 (07/04 0802) SpO2:  [95 %-100 %] 100 % (07/04 0802) Physical Exam: General: Patient is laying supine and appeared in no discomfort Cardiovascular: RRR, no murmurs, normal S1 and S2 Respiratory: Clear breath sounds bilaterally, no wheeze Abdomen: Not distended, no  tenderness Extremities: Right foot dry with some redness, no edema. Wound healing properly       Laboratory: Recent Labs  Lab 12/10/20 0434  WBC 6.8  HGB 13.4  HCT 40.8  PLT 311   Recent Labs  Lab 12/10/20 0434  NA 136  K 4.1  CL 103  CO2 25  BUN 26*  CREATININE 0.78  CALCIUM 8.8*  GLUCOSE 95     Imaging/Diagnostic Tests:  Jerre Simon, MD 12/10/2020, 9:19 AM PGY-1, [place] Family Medicine FPTS Intern pager: [phone], text pages welcome

## 2020-12-11 NOTE — Progress Notes (Signed)
Nutrition Follow-up  DOCUMENTATION CODES:   Severe malnutrition in context of chronic illness, Severe malnutrition in context of social or environmental circumstances  INTERVENTION:   -Continue Ensure Enlive po TID, each supplement provides 350 kcal and 20 grams of protein  -Continue 30 ml Prosource Plus TID, each supplement provides 100 kcals and 15 grams protein -Continue 1 packet Juven BID, each packet provides 95 calories, 2.5 grams of protein (collagen), and 9.8 grams of carbohydrate (3 grams sugar); also contains 7 grams of L-arginine and L-glutamine, 300 mg vitamin C, 15 mg vitamin E, 1.2 mcg vitamin B-12, 9.5 mg zinc, 200 mg calcium, and 1.5 g  Calcium Beta-hydroxy-Beta-methylbutyrate to support wound healing -Continue MVI with minerals daily   NUTRITION DIAGNOSIS:   Severe Malnutrition related to chronic illness, social / environmental circumstances as evidenced by percent weight loss, energy intake < or equal to 75% for > or equal to 1 month, mild fat depletion, mild muscle depletion.  Ongoing  GOAL:   Patient will meet greater than or equal to 90% of their needs  Progressing   MONITOR:   PO intake, Supplement acceptance, Labs, Weight trends, Skin, I & O's  REASON FOR ASSESSMENT:   Consult, Malnutrition Screening Tool Wound healing  ASSESSMENT:   55 yo male with a PMH of EtOH use disorder, HTN, COPD, L-eye blindness, tobacco use, and CVA with residual R-sided weakness who was found down covered in bodily fluids, positive for RLE cellulitis, rhabdomyolosis, and hyponatremia.  Reviewed I/O's: -500 ml x 24 hours and -14.1 L since 11/27/20  UOP: 500 ml x 24 hours  Pt remains with great appetite. Noted meal completion 80-100%. Pt continues to take supplements well.   Medications reviewed and include miralax, vitamin B-12, folic acid and thiamine.    Per TOC notes, pt medically stable for discharge and awaiting SNF placement via LOG. Due to medical stability, RD will  sign off.    Labs reviewed.  Diet Order:   Diet Order             Diet Heart Room service appropriate? Yes with Assist; Fluid consistency: Thin  Diet effective now                   EDUCATION NEEDS:   Education needs have been addressed  Skin:  Skin Assessment: Skin Integrity Issues: Skin Integrity Issues:: Other (Comment) Other: R foot cellulitis, open  Last BM:  12/11/20  Height:   Ht Readings from Last 1 Encounters:  11/23/20 5\' 6"  (1.676 m)    Weight:   Wt Readings from Last 1 Encounters:  11/23/20 84.1 kg    Ideal Body Weight:  64.5 kg  BMI:  Body mass index is 29.93 kg/m.  Estimated Nutritional Needs:   Kcal:  2300-2500  Protein:  130-145 grams  Fluid:  >2 L    11/25/20, RD, LDN, CDCES Registered Dietitian II Certified Diabetes Care and Education Specialist Please refer to [facility] for RD and/or RD on-call/weekend/after hours pager

## 2020-12-11 NOTE — Progress Notes (Addendum)
1:20pm: CSW spoke with Carlynn Spry regarding this patient's poor living conditions - Carlynn Spry to speak with a representative at the [facility] make them aware of the patient's needs.  10:15am: [facility] unable to offer a bed at this time.  9:45am: CSW sent patient's clinicals to [facility] for review.  9:15am: CSW spoke with Chauncey of APS who is agreeable to placement with 30 day LOG. Donovan Kail states he is working with the patient's pastor Brett Canales to obtain estimates for labor contracts to make repairs to the patient's home.  8:30am: The LOG request was approved - CSW will attempt to find a facility to accept the 30 day LOG.  8am: CSW sent LOG request for review to [facility].  Edwin Dada, MSW, LCSW Transitions of Care  Clinical Social Worker II [phone]

## 2020-12-11 NOTE — Plan of Care (Signed)
  Problem: Education: Goal: Knowledge of General Education information will improve Description: Including pain rating scale, medication(s)/side effects and non-pharmacologic comfort measures Outcome: Progressing   Problem: Clinical Measurements: Goal: Ability to maintain clinical measurements within normal limits will improve Outcome: Progressing Goal: Cardiovascular complication will be avoided Outcome: Progressing   Problem: Activity: Goal: Risk for activity intolerance will decrease Outcome: Progressing   Problem: Nutrition: Goal: Adequate nutrition will be maintained Outcome: Progressing   Problem: Coping: Goal: Level of anxiety will decrease Outcome: Progressing   Problem: Elimination: Goal: Will not experience complications related to bowel motility Outcome: Progressing Goal: Will not experience complications related to urinary retention Outcome: Progressing   Problem: Pain Managment: Goal: General experience of comfort will improve Outcome: Progressing   Problem: Safety: Goal: Ability to remain free from injury will improve Outcome: Progressing

## 2020-12-11 NOTE — Progress Notes (Signed)
FPTS Brief Progress Note  S:Spoke with nurse who states pt  has no complaints of pain and is resting well.   O: BP 129/84 (BP Location: Left Arm)   Pulse 87   Temp 97.8 F (36.6 C) (Oral)   Resp 16   Ht 5\' 6"  (1.676 m)   Wt 84.1 kg   SpO2 99%   BMI 29.93 kg/m     A/P: 55 yo male with right foot cellulitis -Continue wound care as outlined by am progress note  - Orders reviewed. Labs for AM not ordered, which was adjusted as needed.    40, DO 12/11/2020, 1:08 AM PGY-1, [facility] Night Resident  Please page [phone] with questions.

## 2020-12-11 NOTE — Progress Notes (Signed)
Physical Therapy Treatment Patient Details Name: Robert Frank MRN: [id number] DOB: 1965/11/22 Today's Date: 12/11/2020    History of Present Illness Pt is a 55 year old man admitted on 11/22/20 after being found down covered in bodily fluids. + R LE cellulitis, sepsis, rhabdomyolosis, hyponatremia. PMH: alcohol use disorder, HTN, COPD, L eye blindness, tobacco use, CVA with residual R side weakness.    PT Comments    Pt sleeping on arrival. Arousable for participation in therapy, although pt with no verbalizations and minimally engaged. He required min assist bed mobility and mod assist squat pivot transfer bed to recliner. Pt in recliner with feet elevated at end of session. Pillow for support under R distal LE.    Follow Up Recommendations  SNF;Supervision/Assistance - 24 hour     Equipment Recommendations  Other (comment) (TBA post acute)    Recommendations for Other Services       Precautions / Restrictions Precautions Precautions: Fall;Other (comment) Precaution Comments: HOH; Contact precautions Restrictions RLE Weight Bearing: Weight bearing as tolerated    Mobility  Bed Mobility Overal bed mobility: Needs Assistance Bed Mobility: Supine to Sit     Supine to sit: HOB elevated;Min assist     General bed mobility comments: +rail, increased time, assist to elevate trunk    Transfers Overall transfer level: Needs assistance   Transfers: Squat Pivot Transfers     Squat pivot transfers: Mod assist     General transfer comment: Pt declined sit <> stand with RW. Squat pivot transfer bed to recliner toward left. Therapist anterior to pt, providing BUE support on gait belt.  Ambulation/Gait                 Stairs             Wheelchair Mobility    Modified Rankin (Stroke Patients Only)       Balance Overall balance assessment: Needs assistance Sitting-balance support: No upper extremity supported;Feet unsupported Sitting balance-Leahy  Scale: Fair     Standing balance support: Bilateral upper extremity supported;During functional activity Standing balance-Leahy Scale: Poor Standing balance comment: reliant on external support                            Cognition Arousal/Alertness: Awake/alert Behavior During Therapy: Flat affect Overall Cognitive Status: Impaired/Different from baseline Area of Impairment: Safety/judgement;Problem solving                         Safety/Judgement: Decreased awareness of safety;Decreased awareness of deficits   Problem Solving: Slow processing;Decreased initiation;Difficulty sequencing;Requires verbal cues General Comments: minimal verbalizations/interaction. Pt pushing the RW away from him, refusing to use it.      Exercises      General Comments        Pertinent Vitals/Pain Pain Assessment: Faces Faces Pain Scale: Hurts a little bit Pain Location: R LE Pain Descriptors / Indicators: Grimacing;Discomfort Pain Intervention(s): Monitored during session;Limited activity within patient's tolerance;Repositioned    Home Living                      Prior Function            PT Goals (current goals can now be found in the care plan section) Acute Rehab PT Goals Patient Stated Goal: not stated PT Goal Formulation: With patient Time For Goal Achievement: 12/25/20 Potential to Achieve Goals: Fair Progress towards PT goals: Progressing toward  goals    Frequency    Min 2X/week      PT Plan Current plan remains appropriate    Co-evaluation              AM-PAC PT "6 Clicks" Mobility   Outcome Measure  Help needed turning from your back to your side while in a flat bed without using bedrails?: A Little Help needed moving from lying on your back to sitting on the side of a flat bed without using bedrails?: A Little Help needed moving to and from a bed to a chair (including a wheelchair)?: A Lot Help needed standing up from a chair  using your arms (e.g., wheelchair or bedside chair)?: A Lot Help needed to walk in hospital room?: A Lot Help needed climbing 3-5 steps with a railing? : Total 6 Click Score: 13    End of Session Equipment Utilized During Treatment: Gait belt Activity Tolerance: Patient tolerated treatment well Patient left: in chair;with call bell/phone within reach;with chair alarm set Nurse Communication: Mobility status PT Visit Diagnosis: Muscle weakness (generalized) (M62.81);Difficulty in walking, not elsewhere classified (R26.2);Pain Pain - Right/Left: Right Pain - part of body: Ankle and joints of foot     Time: 1740-8144 PT Time Calculation (min) (ACUTE ONLY): 11 min  Charges:  $Therapeutic Activity: 8-22 mins                     Aida Raider, PT  Office # [phone] Pager [phone]    Ilda Foil 12/11/2020, 12:15 PM

## 2020-12-11 NOTE — Progress Notes (Signed)
Family Medicine Teaching Service Daily Progress Note Intern Pager: [phone]  Patient name: Robert Frank Medical record number: [id number] Date of birth: 05/19/1966 Age: 55 y.o. Gender: male  Primary Care Provider: Pcp, No Consultants: None Code Status: Full  Pt Overview and Major Events to Date:  6/17- Admission  Assessment and Plan: Robert Frank is a 55 year old male presents with right foot cellulitis associated with mild infestation of maggots.  PMH significant for hypertension, COPD, chronic alcohol use, left eye blindness, malnourishment and homelessness.  *Patient medically stable for discharge*  Right Foot cellulitis Stable.  Completed 10 day course of antibiotics. -Wound care with bacitracin, mupirocin, chlorhexidine pads  Dispo Difficult SNF placement given insurance status.  Possible patient could do 30 day stay in SNF. - Touch base with SW regarding conversation with [facility] director.  Contact Precautions History of Bedbugs and MRSA nasal swab positive. - Call infection prevention regarding precautions update  FEN/GI:  PPx: Lovenox Dispo:SNF today. Barriers include difficult placement due to insurance.   Subjective:  Patient feeling well this morning.  Denies any new problems.  Objective: Temp:  [97.5 F (36.4 C)-98.1 F (36.7 C)] 97.8 F (36.6 C) (07/04 2006) Pulse Rate:  [79-87] 87 (07/04 2006) Resp:  [16] 16 (07/04 1437) BP: (121-129)/(61-84) 129/84 (07/04 2006) SpO2:  [98 %-100 %] 99 % (07/04 2006) Physical Exam:  Physical Exam HENT:     Head: Normocephalic and atraumatic.     Mouth/Throat:     Mouth: Mucous membranes are moist.  Cardiovascular:     Rate and Rhythm: Normal rate and regular rhythm.  Pulmonary:     Effort: Pulmonary effort is normal.     Breath sounds: Normal breath sounds.  Musculoskeletal:        General: No swelling.     Right lower leg: No edema.     Left lower leg: No edema.  Skin:    General: Skin is warm.   Neurological:     Mental Status: He is alert.     Laboratory: Recent Labs  Lab 12/10/20 0434  WBC 6.8  HGB 13.4  HCT 40.8  PLT 311   Recent Labs  Lab 12/10/20 0434  NA 136  K 4.1  CL 103  CO2 25  BUN 26*  CREATININE 0.78  CALCIUM 8.8*  GLUCOSE 95    Imaging/Diagnostic Tests: No new imaging  Jovita Kussmaul, MD 12/11/2020, 6:41 AM PGY-1, [facility] FPTS Intern pager: [phone], text pages welcome

## 2020-12-12 ENCOUNTER — Inpatient Hospital Stay (HOSPITAL_COMMUNITY): Payer: Self-pay

## 2020-12-12 NOTE — Progress Notes (Signed)
Family Medicine Teaching Service Daily Progress Note Intern Pager: [phone]  Patient name: Robert Frank Medical record number: [id number] Date of birth: 07/02/1965 Age: 55 y.o. Gender: male  Primary Care Provider: Pcp, No Consultants: None Code Status: Full  Pt Overview and Major Events to Date:  6/17 Admission  Assessment and Plan: Patient presented for cellulitis.  Medically stable.  Currently wanting SNF placement.  Patient is medically stable for discharge.  Right Foot Cellulitis, Resolved Finished 10 day antibiotic course - Continue daily wound care - Tylenol for pain  Cough Patient compalining of this morning - Obtain chest X-Ray  Dispo Continue working on SNF placement - follow-up social work notes   FEN/GI: Heart Healthy Diet PPx: Lovenox Dispo:SNF today. Barriers include difficult placement due to insurance.   Subjective:  Patient indicates has cough.  No other complaints.  Objective: Temp:  [97.6 F (36.4 C)-98.1 F (36.7 C)] 98.1 F (36.7 C) (07/05 1949) Pulse Rate:  [80-86] 84 (07/05 1949) Resp:  [14-17] 16 (07/05 1949) BP: (112-130)/(84-96) 130/96 (07/05 1949) SpO2:  [96 %-99 %] 99 % (07/05 1949) Physical Exam:  Physical Exam Constitutional:      General: He is not in acute distress.    Appearance: He is not ill-appearing.  HENT:     Head: Normocephalic and atraumatic.     Mouth/Throat:     Mouth: Mucous membranes are moist.  Cardiovascular:     Rate and Rhythm: Normal rate and regular rhythm.     Pulses: Normal pulses.  Pulmonary:     Effort: Pulmonary effort is normal.     Breath sounds: Normal breath sounds.  Musculoskeletal:     Right lower leg: No edema.     Left lower leg: No edema.  Skin:    General: Skin is warm.  Neurological:     Mental Status: He is alert.     Laboratory: Recent Labs  Lab 12/10/20 0434  WBC 6.8  HGB 13.4  HCT 40.8  PLT 311   Recent Labs  Lab 12/10/20 0434  NA 136  K 4.1  CL 103  CO2 25   BUN 26*  CREATININE 0.78  CALCIUM 8.8*  GLUCOSE 95    Imaging/Diagnostic Tests: No new imaging  Jovita Kussmaul, MD 12/12/2020, 7:03 AM PGY-1, [facility] FPTS Intern pager: [phone], text pages welcome

## 2020-12-12 NOTE — Progress Notes (Addendum)
1:30pm: CSW sent secure e-mail to [place] at [facility] requesting a review.  9:40am: [facility] is unable to offer patient a bed at this time.  CSW spoke with Delorise Shiner at [facility] who is agreeable to review referral.  9am: CSW sent secure message to Lynnea Ferrier at [facility] requesting a review of this patient.  Edwin Dada, MSW, LCSW Transitions of Care  Clinical Social Worker II [phone]

## 2020-12-13 NOTE — Progress Notes (Signed)
Physical Therapy Treatment Patient Details Name: Robert Frank MRN: [id number] DOB: May 12, 1966 Today's Date: 12/13/2020    History of Present Illness Pt is a 55 year old man admitted on 11/22/20 after being found down covered in bodily fluids. + R LE cellulitis, sepsis, rhabdomyolosis, hyponatremia. PMH: alcohol use disorder, HTN, COPD, L eye blindness, tobacco use, CVA with residual R side weakness.    PT Comments    Pt seated on commode on arrival.  He was flat and continues to be minimally conversive. He required cues for mobility and assistance to maintain balance.  He quickly becomes agitated with instruction.  Pt remains at risk for falls and assisted back to bed.     Follow Up Recommendations  SNF;Supervision/Assistance - 24 hour     Equipment Recommendations  Other (comment) (TBA in post acute setting.)    Recommendations for Other Services       Precautions / Restrictions Precautions Precautions: Fall;Other (comment) Precaution Comments: HOH; Contact precautions Restrictions Weight Bearing Restrictions: No    Mobility  Bed Mobility Overal bed mobility: Needs Assistance Bed Mobility: Sit to Supine       Sit to supine: Min assist   General bed mobility comments: Cues for hand placement and assistance to lift LEs back to bed.    Transfers Overall transfer level: Needs assistance Equipment used: Rolling walker (2 wheeled) Transfers: Sit to/from Stand Sit to Stand: Mod assist         General transfer comment: Mod assistance to stand upright in RW.  He presents with R knee flexed and decreased weight shift to R foot in standing.  Pt noted to sit impulsively before backed to seated surface.  He remains a fall risk.  Ambulation/Gait Ambulation/Gait assistance: Max assist Gait Distance (Feet): 6 Feet   Gait Pattern/deviations: Step-to pattern;Trunk flexed;Decreased stance time - right;Decreased weight shift to right;Leaning posteriorly     General Gait  Details: Pt with limited weight bearing on R and poor posture after he progressed step to bed from commode he started to sit before full backed up to bed.  Required additional sit to stand and trial of 2 additional sidesteps to the R to move to [facility] before sitting.   Stairs             Wheelchair Mobility    Modified Rankin (Stroke Patients Only)       Balance Overall balance assessment: Needs assistance Sitting-balance support: No upper extremity supported;Feet unsupported Sitting balance-Leahy Scale: Fair   Postural control: Posterior lean Standing balance support: Bilateral upper extremity supported;During functional activity Standing balance-Leahy Scale: Poor Standing balance comment: reliant on external support                            Cognition Arousal/Alertness: Awake/alert Behavior During Therapy: Flat affect;Agitated (flat at first but becomes quickly agitated with cues to correct his mobility to improve his safety.) Overall Cognitive Status: Difficult to assess                                 General Comments: minimal verbalizations/interaction. Pt agitated with progression of mobility this session.  Difficult to assess cogntiion.      Exercises      General Comments        Pertinent Vitals/Pain Pain Assessment: 0-10 Pain Score: 7  Pain Location: R LE/foot Pain Descriptors / Indicators: Grimacing;Discomfort Pain Intervention(s):  Monitored during session;Repositioned    Home Living                      Prior Function            PT Goals (current goals can now be found in the care plan section) Acute Rehab PT Goals Patient Stated Goal: not stated Potential to Achieve Goals: Fair Additional Goals Additional Goal #1: Pt will acheive >/= -15 degrees R knee extension PROM. Progress towards PT goals: Not progressing toward goals - comment    Frequency    Min 2X/week      PT Plan Current plan remains  appropriate    Co-evaluation              AM-PAC PT "6 Clicks" Mobility   Outcome Measure  Help needed turning from your back to your side while in a flat bed without using bedrails?: A Little Help needed moving from lying on your back to sitting on the side of a flat bed without using bedrails?: A Little Help needed moving to and from a bed to a chair (including a wheelchair)?: A Lot Help needed standing up from a chair using your arms (e.g., wheelchair or bedside chair)?: A Lot Help needed to walk in hospital room?: A Lot Help needed climbing 3-5 steps with a railing? : Total 6 Click Score: 13    End of Session Equipment Utilized During Treatment: Gait belt Activity Tolerance: Patient tolerated treatment well Patient left: with call bell/phone within reach;in bed;with bed alarm set Nurse Communication: Mobility status PT Visit Diagnosis: Muscle weakness (generalized) (M62.81);Difficulty in walking, not elsewhere classified (R26.2);Pain Pain - Right/Left: Right Pain - part of body: Ankle and joints of foot     Time: 7673-4193 PT Time Calculation (min) (ACUTE ONLY): 27 min  Charges:  $Gait Training: 8-22 mins $Therapeutic Activity: 8-22 mins                     Bonney Leitz , PTA Acute Rehabilitation Services Pager [phone] Office [phone]    Darcelle Herrada Artis Delay 12/13/2020, 5:16 PM

## 2020-12-13 NOTE — Progress Notes (Signed)
Occupational Therapy Treatment Patient Details Name: Robert Frank MRN: [id number] DOB: 1966/01/01 Today's Date: 12/13/2020    History of present illness Pt is a 55 year old man admitted on 11/22/20 after being found down covered in bodily fluids. + R LE cellulitis, sepsis, rhabdomyolosis, hyponatremia. PMH: alcohol use disorder, HTN, COPD, L eye blindness, tobacco use, CVA with residual R side weakness.   OT comments  Pt. Was asked several times to transfer OOB and was not agreeable. Pt. Was agreeable to ADLs in bed. Pt. Is making progress with LE ADLs. Pt. To be followed while in acute care.   Follow Up Recommendations  SNF;Supervision/Assistance - 24 hour    Equipment Recommendations  None recommended by OT    Recommendations for Other Services      Precautions / Restrictions Precautions Precautions: Fall;Other (comment) Precaution Comments: HOH; Contact precautions Restrictions RLE Weight Bearing: Weight bearing as tolerated       Mobility Bed Mobility Overal bed mobility: Needs Assistance       Supine to sit: HOB elevated;Min assist Sit to supine: Mod assist;HOB elevated        Transfers                      Balance       Sitting balance - Comments: Varying minA to Supervision sitting EOB statically, pt with posterior/right LOB with dynamic seated task (attempting to don socks).                                   ADL either performed or assessed with clinical judgement   ADL Overall ADL's : Needs assistance/impaired Eating/Feeding: Set up   Grooming: Oral care;Set up;Sitting;Wash/dry hands;Wash/dry face           Upper Body Dressing : Set up;Sitting   Lower Body Dressing: Sitting/lateral leans;Moderate assistance;Maximal assistance               Functional mobility during ADLs:  (Pt. refused to get OOB today.) General ADL Comments: Pt. had increased ability with LE ADLs today.     Vision   Additional Comments:  blind in l eye   Perception     Praxis      Cognition Arousal/Alertness: Awake/alert Behavior During Therapy: Flat affect                                            Exercises     Shoulder Instructions       General Comments      Pertinent Vitals/ Pain       Pain Assessment: 0-10 Pain Score: 3  Pain Location: R LE Pain Descriptors / Indicators: Grimacing;Discomfort Pain Intervention(s): Limited activity within patient's tolerance  Home Living                                          Prior Functioning/Environment              Frequency  Min 2X/week        Progress Toward Goals  OT Goals(current goals can now be found in the care plan section)  Progress towards OT goals: Progressing toward goals  Acute Rehab OT Goals Patient Stated  Goal: not stated OT Goal Formulation: With patient Time For Goal Achievement: 12/21/20 Potential to Achieve Goals: Fair ADL Goals Pt Will Perform Grooming: with modified independence;sitting Pt Will Perform Upper Body Dressing: with modified independence;sitting Pt Will Perform Lower Body Dressing: with min guard assist;sit to/from stand Pt Will Transfer to Toilet: with modified independence;stand pivot transfer;bedside commode Pt Will Perform Toileting - Clothing Manipulation and hygiene: with supervision;sitting/lateral leans Additional ADL Goal #1: Pt will tolerate sitting EOB x 10 minutes as a precursor to ADL.  Plan Discharge plan remains appropriate    Co-evaluation                 AM-PAC OT "6 Clicks" Daily Activity     Outcome Measure   Help from another person eating meals?: None Help from another person taking care of personal grooming?: A Little Help from another person toileting, which includes using toliet, bedpan, or urinal?: A Lot Help from another person bathing (including washing, rinsing, drying)?: A Lot Help from another person to put on and taking off regular  upper body clothing?: A Little Help from another person to put on and taking off regular lower body clothing?: A Lot 6 Click Score: 16    End of Session    OT Visit Diagnosis: Muscle weakness (generalized) (M62.81);Other symptoms and signs involving cognitive function   Activity Tolerance Patient tolerated treatment well   Patient Left in bed;with call bell/phone within reach;with bed alarm set   Nurse Communication          Time: 5681-2751 OT Time Calculation (min): 32 min  Charges: OT General Charges $OT Visit: 1 Visit OT Treatments $Self Care/Home Management : 23-37 mins  Robert Frank OT/L    Robert Frank 12/13/2020, 1:41 PM

## 2020-12-13 NOTE — Progress Notes (Signed)
Family Medicine Teaching Service Daily Progress Note Intern Pager: [phone]  Patient name: Robert Frank Medical record number: [id number] Date of birth: 1965-08-26 Age: 55 y.o. Gender: male  Primary Care Provider: Pcp, No Consultants: None Code Status: Full  Pt Overview and Major Events to Date:  6/17 Admission  Assessment and Plan: Patient presented with Cellulitis, now adequately treated with antibiotics.  Awaiting SNF placement.  Patient is medically stable for discharge.  Cough Complaint of cough yesterday.  X-Ray negative for pneumonia.  Cough now resolved.   FEN/GI: Heart Healthy Diet PPx: Lovenox Dispo:SNF today. Barriers include insurance.   Subjective:  Patient denies any complaints this morning.  Indicates cough has resolved.  Objective: Temp:  [97.7 F (36.5 C)-98.2 F (36.8 C)] 97.7 F (36.5 C) (07/07 0801) Pulse Rate:  [71-81] 79 (07/07 0801) Resp:  [16-17] 16 (07/07 0801) BP: (122-130)/(78-87) 122/78 (07/07 0801) SpO2:  [98 %-100 %] 98 % (07/07 0801) Physical Exam:  Physical Exam HENT:     Head: Normocephalic and atraumatic.     Mouth/Throat:     Mouth: Mucous membranes are moist.  Cardiovascular:     Rate and Rhythm: Normal rate and regular rhythm.     Pulses: Normal pulses.  Pulmonary:     Effort: Pulmonary effort is normal.     Breath sounds: Normal breath sounds.  Musculoskeletal:     Right lower leg: No edema.     Left lower leg: No edema.  Skin:    General: Skin is warm.  Neurological:     Mental Status: He is alert.     Laboratory: Recent Labs  Lab 12/10/20 0434  WBC 6.8  HGB 13.4  HCT 40.8  PLT 311   Recent Labs  Lab 12/10/20 0434  NA 136  K 4.1  CL 103  CO2 25  BUN 26*  CREATININE 0.78  CALCIUM 8.8*  GLUCOSE 95     Imaging/Diagnostic Tests: EXAM: PORTABLE CHEST 1 VIEW   COMPARISON:  Radiograph 11/22/2020   FINDINGS: Stable heart size and mediastinal contours. Aortic atherosclerosis. There is no focal  airspace disease, pleural effusion, pulmonary edema or pneumothorax. Remote left rib fractures.   IMPRESSION: No acute chest finding.  No evidence of pneumonia.     Electronically Signed   By: Narda Rutherford M.D.   On: 12/12/2020 16:11  Jovita Kussmaul, MD 12/13/2020, 12:47 PM PGY-1, [facility] FPTS Intern pager: [phone], text pages welcome

## 2020-12-13 NOTE — Progress Notes (Addendum)
10:20am: CSW spoke with Delorise Shiner at [facility] who states the facility cannot offer a bed at this time.  7:45am: CSW spoke with [place] at [facility] who states the facility cannot offer a bed at this time.  Edwin Dada, MSW, LCSW Transitions of Care  Clinical Social Worker II [phone]

## 2020-12-14 NOTE — Progress Notes (Signed)
Patient's clinical information is currently under review by administration at [facility] and [facility] for a possible bed offer.  Edwin Dada, MSW, LCSW Transitions of Care  Clinical Social Worker II [phone]

## 2020-12-14 NOTE — Progress Notes (Signed)
FPTS Brief Progress Note  S:Patient resting comfortably in room with no new complaints at this time.    O: BP 115/81   Pulse 85   Temp 98 F (36.7 C) (Oral)   Resp 17   Ht 5\' 6"  (1.676 m)   Wt 84.1 kg   SpO2 98%   BMI 29.93 kg/m   Gen: male in NAD sitting upright in bed  A/P: Pt is 55 y.o. male, stable, s/p treatment for cellulitis.  - medically stable and ready for discharge pending SNF placement - Orders reviewed. Labs for AM ordered for weekly labs, which was adjusted as needed.    40, MD 12/14/2020, 10:47 PM PGY-3, [facility] Night Resident  Please page [phone] with questions.

## 2020-12-14 NOTE — Progress Notes (Signed)
Family Medicine Teaching Service Daily Progress Note Intern Pager: [phone]  Patient name: Robert Frank Medical record number: [id number] Date of birth: 02-09-1966 Age: 55 y.o. Gender: male  Primary Care Provider: Pcp, No Consultants: None Code Status: Full  Pt Overview and Major Events to Date:  6/17- Admission  Assessment and Plan: Patient presented with Cellulitis, adequately treated with Antibiotics.  Awaiting SNF placement.  Patient is medically stable for discharge.  Cellulitis Adequately treated with Antibiotics.  Good perfusion of both feet. - Continue wound care   FEN/GI: Heart Healthy Diet PPx: Lovenox Dispo:SNF today. Barriers include Insurance.   Subjective:  Patient has no complaints today.  Objective: Temp:  [97.9 F (36.6 C)-98.1 F (36.7 C)] 97.9 F (36.6 C) (07/08 0958) Pulse Rate:  [68-83] 78 (07/08 0958) Resp:  [14-18] 18 (07/08 0958) BP: (108-126)/(71-98) 123/89 (07/08 0958) SpO2:  [98 %-100 %] 99 % (07/08 0958) Physical Exam:  Physical Exam HENT:     Head: Normocephalic and atraumatic.     Mouth/Throat:     Mouth: Mucous membranes are moist.  Cardiovascular:     Rate and Rhythm: Normal rate and regular rhythm.     Pulses: Normal pulses.  Pulmonary:     Effort: Pulmonary effort is normal.     Breath sounds: Normal breath sounds.  Skin:    General: Skin is warm.     Capillary Refill: Capillary refill takes less than 2 seconds.  Neurological:     General: No focal deficit present.     Mental Status: He is alert.     Laboratory: Recent Labs  Lab 12/10/20 0434  WBC 6.8  HGB 13.4  HCT 40.8  PLT 311   Recent Labs  Lab 12/10/20 0434  NA 136  K 4.1  CL 103  CO2 25  BUN 26*  CREATININE 0.78  CALCIUM 8.8*  GLUCOSE 95     Imaging/Diagnostic Tests: No new imaging  Jovita Kussmaul, MD 12/14/2020, 11:38 AM PGY-1, [facility] FPTS Intern pager: [phone], text pages welcome

## 2020-12-15 NOTE — Progress Notes (Signed)
Family Medicine Teaching Service Daily Progress Note Intern Pager: [phone]  Patient name: Robert Frank Medical record number: [id number] Date of birth: 1966/03/02 Age: 55 y.o. Gender: male  Primary Care Provider: Pcp, No Consultants: None Code Status: Full code  Pt Overview and Major Events to Date:  6/17 Admitted 6/26 completed 10-day course of antibiotics  Assessment and Plan: 55 year old male presented with cellulitis, completed 10-day course of antibiotics.  Awaiting SNF placement  FEN/GI: Heart healthy diet PPx: Lovenox Dispo:SNF ASAP. Barriers include insurance.   Subjective:  Pt is resting comfortably. No complaints.   Objective: Temp:  [97.9 F (36.6 C)-98 F (36.7 C)] 98 F (36.7 C) (07/08 2128) Pulse Rate:  [68-85] 85 (07/08 2128) Resp:  [17-18] 17 (07/08 1536) BP: (108-128)/(77-98) 115/81 (07/08 2128) SpO2:  [98 %-99 %] 98 % (07/08 2128) Physical Exam: General: Lying supine in bed. NAD Cardiovascular: RRR, normal S1/S2 Respiratory: CTA bilaterally Extremities: No ededma, right foot wrapped in new dressing.  Laboratory: Recent Labs  Lab 12/10/20 0434  WBC 6.8  HGB 13.4  HCT 40.8  PLT 311   Recent Labs  Lab 12/10/20 0434  NA 136  K 4.1  CL 103  CO2 25  BUN 26*  CREATININE 0.78  CALCIUM 8.8*  GLUCOSE 95      Imaging/Diagnostic Tests: No new imaging Erick Alley, DO 12/15/2020, 5:24 AM PGY-1, [facility] FPTS Intern pager: [phone], text pages welcome

## 2020-12-15 NOTE — Plan of Care (Signed)
  Problem: Education: Goal: Knowledge of General Education information will improve Description: Including pain rating scale, medication(s)/side effects and non-pharmacologic comfort measures Outcome: Progressing   Problem: Health Behavior/Discharge Planning: Goal: Ability to manage health-related needs will improve Outcome: Progressing   Problem: Clinical Measurements: Goal: Ability to maintain clinical measurements within normal limits will improve Outcome: Progressing   Problem: Activity: Goal: Risk for activity intolerance will decrease Outcome: Progressing   Problem: Nutrition: Goal: Adequate nutrition will be maintained Outcome: Progressing   Problem: Elimination: Goal: Will not experience complications related to urinary retention Outcome: Progressing   Problem: Pain Managment: Goal: General experience of comfort will improve Outcome: Progressing   

## 2020-12-16 NOTE — Progress Notes (Signed)
FPTS Brief Progress Note  S:Patient resting in room without new complaints at this time. No new nursing concerns at this time.    O: BP 120/75   Pulse 95   Temp 98.1 F (36.7 C) (Oral)   Resp 16   Ht 5\' 6"  (1.676 m)   Wt 84.1 kg   SpO2 98%   BMI 29.93 kg/m   Gen: male appearing older than stated age in NAD, sleeping in bed   A/P: Robert Frank is a 55 y.o. male admitted for cellulitis with difficult living situation, awaiting SNF placement. Stable.  - no new medical interventions at this time  - Orders reviewed. Labs for AM ordered, which was adjusted as needed.    40, MD 12/16/2020, 9:44 PM PGY-3, [facility] Night Resident  Please page [phone] with questions.

## 2020-12-16 NOTE — Progress Notes (Signed)
Family Medicine Teaching Service Daily Progress Note Intern Pager: [phone]  Patient name: Robert Frank Medical record number: [id number] Date of birth: November 24, 1965 Age: 55 y.o. Gender: male  Primary Care Provider: Pcp, No Consultants: None Code Status: Full  Pt Overview and Major Events to Date:  Admitted 6/17 Completed 10 day antibiotic course 6/26 Awaiting safe placement  Assessment and Plan: Robert Frank is a 55 year old male who presented with right foot cellulitis associated with maggot infestation. PMH significant for hypertesnion, COPD, chronic alcohol use, left eye blindness, malnourishment, and homelessness.  Cellulitis: treated and improved  Macrocytosis: chronic and stable -CBC qweekly on Mondays  FEN/GI: Heart healthy diet PPx: lovenox Dispo:SNF  ASAP . Barriers include insurance.   Subjective:  Patient reports no concerns, resting in bed. Requests a drink.  Objective: Temp:  [97.8 F (36.6 C)-98.1 F (36.7 C)] 98 F (36.7 C) (07/09 2204) Pulse Rate:  [87-93] 93 (07/09 2204) Resp:  [17-18] 18 (07/09 2204) BP: (113-124)/(71-85) 113/71 (07/09 2204) SpO2:  [97 %-100 %] 100 % (07/09 2204) Physical Exam: General: appears older than stated age, WM, NAD, resting comfortably in bed Cardiovascular: RRR, no m/r/g, 2+ radial pulses Respiratory: CTAB, no iWOB Extremities: No edema, right foot wrapped in fresh dressing, 2+ DP, significant overlap of right first toe over second toe  Laboratory: Recent Labs  Lab 12/10/20 0434  WBC 6.8  HGB 13.4  HCT 40.8  PLT 311   Recent Labs  Lab 12/10/20 0434  NA 136  K 4.1  CL 103  CO2 25  BUN 26*  CREATININE 0.78  CALCIUM 8.8*  GLUCOSE 95   Imaging/Diagnostic Tests: No new imaging  Shirlean Mylar, MD 12/16/2020, 2:06 AM PGY-3, [facility] FPTS Intern pager: [phone], text pages welcome

## 2020-12-17 LAB — CBC
HCT: 41 % (ref 39.0–52.0)
Hemoglobin: 13.5 g/dL (ref 13.0–17.0)
MCH: 33.4 pg (ref 26.0–34.0)
MCHC: 32.9 g/dL (ref 30.0–36.0)
MCV: 101.5 fL — ABNORMAL HIGH (ref 80.0–100.0)
Platelets: 277 10*3/uL (ref 150–400)
RBC: 4.04 MIL/uL — ABNORMAL LOW (ref 4.22–5.81)
RDW: 13.2 % (ref 11.5–15.5)
WBC: 7.3 10*3/uL (ref 4.0–10.5)
nRBC: 0 % (ref 0.0–0.2)

## 2020-12-17 LAB — BASIC METABOLIC PANEL
Anion gap: 4 — ABNORMAL LOW (ref 5–15)
BUN: 23 mg/dL — ABNORMAL HIGH (ref 6–20)
CO2: 27 mmol/L (ref 22–32)
Calcium: 8.9 mg/dL (ref 8.9–10.3)
Chloride: 102 mmol/L (ref 98–111)
Creatinine, Ser: 0.79 mg/dL (ref 0.61–1.24)
GFR, Estimated: >60 mL/min (ref >60)
Glucose, Bld: 109 mg/dL — ABNORMAL HIGH (ref 70–99)
Potassium: 3.7 mmol/L (ref 3.5–5.1)
Sodium: 133 mmol/L — ABNORMAL LOW (ref 135–145)

## 2020-12-17 NOTE — Progress Notes (Addendum)
Family Medicine Teaching Service Daily Progress Note Intern Pager: [phone]  Patient name: Robert Frank Medical record number: [id number] Date of birth: [birth year] Age: 55 y.o. Gender: male  Primary Care Provider: Pcp, No Consultants: None Code Status: Full  Pt Overview and Major Events to Date:  6/17- Admission  Assessment and Plan: Patient admitted due to cellulitis of right foot with foreign body.  Completed antibiotic course, now awaiting SNF placement.  Medically cleared for discharge  Cellulitis:  Treated and improved  GoC Difficult SNF placement given lack of insurance. - Continue to follow-up with SW regarding placement  FEN/GI: Heart healthy diet PPx: Lovenox Dispo:SNF today. Barriers include Insurance authorization.   Subjective:  Patient has no complaints.  Indicates heel pain is mild and contineus to improve.  Objective: Temp:  [98.1 F (36.7 C)-98.4 F (36.9 C)] 98.4 F (36.9 C) (07/11 0847) Pulse Rate:  [74-95] 85 (07/11 0847) Resp:  [16-17] 17 (07/11 0847) BP: (109-120)/(74-77) 115/75 (07/11 0847) SpO2:  [98 %] 98 % (07/11 0847) Physical Exam: General: NAD Cardiovascular: Regular rate and rhythm, normal heart sounds Respiratory: CTAB Extremities: Right heel non-tender to palpation  Laboratory: Recent Labs  Lab 12/17/20 0951  WBC 7.3  HGB 13.5  HCT 41.0  PLT 277   Recent Labs  Lab 12/17/20 0951  NA 133*  K 3.7  CL 102  CO2 27  BUN 23*  CREATININE 0.79  CALCIUM 8.9  GLUCOSE 109*    Imaging/Diagnostic Tests: No new imaging  Jovita Kussmaul, MD 12/17/2020, 11:57 AM PGY-1, [facility] FPTS Intern pager: [phone], text pages welcome

## 2020-12-17 NOTE — Progress Notes (Addendum)
Per documentation, patient's right foot cellulitis is improving and is only requiring basic care at this time. Unfortunately, CSW has been unable to locate a SNF willing to accept the LOG for this patient. Patient was denied admission to [facility], [address], and [place].  CSW spoke with Donovan Kail of [facility] who is requesting the patient remain inpatient one more day until he can determine if the home is safe for the patient to return.  Edwin Dada, MSW, LCSW Transitions of Care  Clinical Social Worker II [phone]

## 2020-12-17 NOTE — Plan of Care (Signed)

## 2020-12-18 LAB — SARS CORONAVIRUS 2 (TAT 6-24 HRS): SARS Coronavirus 2: NEGATIVE

## 2020-12-18 MED ORDER — BACITRACIN ZINC 500 UNIT/GM EX OINT: TOPICAL_OINTMENT | Freq: Two times a day (BID) | CUTANEOUS | 0 refills | Status: AC

## 2020-12-18 MED ORDER — SENNA 8.6 MG PO TABS: 1.0000 | ORAL_TABLET | Freq: Every day | ORAL | 0 refills | Status: AC

## 2020-12-18 MED ORDER — FOLIC ACID 1 MG PO TABS: 1.0000 mg | ORAL_TABLET | Freq: Every day | ORAL | Status: AC

## 2020-12-18 MED ORDER — CYANOCOBALAMIN 1000 MCG PO TABS: 1000.0000 ug | ORAL_TABLET | Freq: Every day | ORAL | Status: AC

## 2020-12-18 MED ORDER — UMECLIDINIUM BROMIDE 62.5 MCG/INH IN AEPB: 1.0000 | INHALATION_SPRAY | Freq: Every day | RESPIRATORY_TRACT | Status: AC

## 2020-12-18 MED ORDER — ENSURE ENLIVE PO LIQD: 237.0000 mL | Freq: Three times a day (TID) | ORAL | 12 refills | Status: AC

## 2020-12-18 MED ORDER — NICOTINE 14 MG/24HR TD PT24
14.0000 mg | MEDICATED_PATCH | Freq: Every day | TRANSDERMAL | 0 refills | Status: AC
Start: 2020-12-19 — End: ?

## 2020-12-18 MED ORDER — METOPROLOL TARTRATE 25 MG PO TABS: 25.0000 mg | ORAL_TABLET | Freq: Two times a day (BID) | ORAL | Status: AC

## 2020-12-18 MED ORDER — PROSOURCE PLUS PO LIQD: 30.0000 mL | Freq: Three times a day (TID) | ORAL | Status: AC

## 2020-12-18 MED ORDER — ATORVASTATIN CALCIUM 20 MG PO TABS: 20.0000 mg | ORAL_TABLET | Freq: Every day | ORAL | Status: AC

## 2020-12-18 MED ORDER — POLYETHYLENE GLYCOL 3350 17 G PO PACK: 17.0000 g | PACK | Freq: Every day | ORAL | 0 refills | Status: AC

## 2020-12-18 MED ORDER — JUVEN PO PACK: 1.0000 | PACK | Freq: Two times a day (BID) | ORAL | 0 refills | Status: AC

## 2020-12-18 MED ORDER — IPRATROPIUM-ALBUTEROL 0.5-2.5 (3) MG/3ML IN SOLN
3.0000 mL | Freq: Four times a day (QID) | RESPIRATORY_TRACT | Status: AC | PRN
Start: 2020-12-18 — End: ?

## 2020-12-18 MED ORDER — ACETAMINOPHEN 325 MG PO TABS: 650.0000 mg | ORAL_TABLET | Freq: Four times a day (QID) | ORAL | Status: AC | PRN

## 2020-12-18 NOTE — Progress Notes (Signed)
Occupational Therapy Treatment Patient Details Name: Robert Frank MRN: [id number] DOB: 02-Feb-1966 Today's Date: 12/18/2020    History of present illness Pt is a 55 year old man admitted on 11/22/20 after being found down covered in bodily fluids. + R LE cellulitis, sepsis, rhabdomyolosis, hyponatremia. PMH: alcohol use disorder, HTN, COPD, L eye blindness, tobacco use, CVA with residual R side weakness.   OT comments  Pt making progress with functional goals. Session focused on bed mobility to sit EOB, UB bathing and dressing seated EOB, sit - stand with OT and NT assist, grooming seated, standing at RW for activity tolerance, SPTs. OT will continue to follow acutely to maximize level of function and safety  Follow Up Recommendations  SNF;Supervision/Assistance - 24 hour    Equipment Recommendations  Other (comment) (TBD at SNF)    Recommendations for Other Services      Precautions / Restrictions Precautions Precautions: Fall;Other (comment) Precaution Comments: HOH; Contact precautions Restrictions Weight Bearing Restrictions: No RLE Weight Bearing: Weight bearing as tolerated       Mobility Bed Mobility Overal bed mobility: Needs Assistance Bed Mobility: Sit to Supine;Supine to Sit     Supine to sit: HOB elevated;Min guard Sit to supine: Min assist   General bed mobility comments: Cues for hand placement and assistance to lift LEs back to bed.    Transfers Overall transfer level: Needs assistance Equipment used: Rolling walker (2 wheeled) Transfers: Sit to/from Stand Sit to Stand: Min assist;+2 physical assistance Stand pivot transfers: Mod assist            Balance Overall balance assessment: Needs assistance Sitting-balance support: No upper extremity supported;Feet unsupported Sitting balance-Leahy Scale: Fair     Standing balance support: Bilateral upper extremity supported;During functional activity Standing balance-Leahy Scale: Poor                              ADL either performed or assessed with clinical judgement   ADL Overall ADL's : Needs assistance/impaired     Grooming: Set up;Sitting;Wash/dry hands;Wash/dry face;Supervision/safety   Upper Body Bathing: Min guard;Sitting   Lower Body Bathing: Sitting/lateral leans;Maximal assistance   Upper Body Dressing : Min guard;Sitting   Lower Body Dressing: Sitting/lateral leans;Moderate assistance;Sit to/from stand;Cueing for safety   Toilet Transfer: Minimal assistance;+2 for safety/equipment   Toileting- Clothing Manipulation and Hygiene: Moderate assistance;Sit to/from stand       Functional mobility during ADLs: Minimal assistance;Rolling walker;Cueing for safety General ADL Comments: Pt participated in bathing and dressing seated EOB, sit - stand for LB     Vision Baseline Vision/History: Legally blind Patient Visual Report: No change from baseline     Perception     Praxis      Cognition Arousal/Alertness: Awake/alert Behavior During Therapy: Flat affect Overall Cognitive Status: No family/caregiver present to determine baseline cognitive functioning Area of Impairment: Safety/judgement;Problem solving                     Memory: Decreased short-term memory Following Commands: Follows one step commands with increased time Safety/Judgement: Decreased awareness of safety;Decreased awareness of deficits   Problem Solving: Slow processing;Decreased initiation;Difficulty sequencing;Requires verbal cues General Comments: minimal verbalizations/interaction        Exercises     Shoulder Instructions       General Comments      Pertinent Vitals/ Pain       Pain Assessment: Faces Faces Pain Scale: Hurts a little  bit Pain Location: R LE/foot Pain Descriptors / Indicators: Grimacing;Discomfort Pain Intervention(s): Monitored during session;Repositioned  Home Living                                           Prior Functioning/Environment              Frequency  Min 2X/week        Progress Toward Goals  OT Goals(current goals can now be found in the care plan section)  Progress towards OT goals: Progressing toward goals  Acute Rehab OT Goals Patient Stated Goal: not stated  Plan Discharge plan remains appropriate    Co-evaluation                 AM-PAC OT "6 Clicks" Daily Activity     Outcome Measure   Help from another person eating meals?: None Help from another person taking care of personal grooming?: A Little Help from another person toileting, which includes using toliet, bedpan, or urinal?: A Lot Help from another person bathing (including washing, rinsing, drying)?: A Lot Help from another person to put on and taking off regular upper body clothing?: A Little Help from another person to put on and taking off regular lower body clothing?: A Lot 6 Click Score: 16    End of Session Equipment Utilized During Treatment: Gait belt;Rolling walker  OT Visit Diagnosis: Muscle weakness (generalized) (M62.81);Other symptoms and signs involving cognitive function   Activity Tolerance Patient tolerated treatment well   Patient Left in bed;with call bell/phone within reach;with bed alarm set   Nurse Communication          Time: 0981-1914 OT Time Calculation (min): 27 min  Charges: OT General Charges $OT Visit: 1 Visit OT Treatments $Self Care/Home Management : 8-22 mins $Therapeutic Activity: 8-22 mins     Galen Manila 12/18/2020, 2:50 PM

## 2020-12-18 NOTE — Progress Notes (Addendum)
1pm: Patient will go to [facility] via [place] - [facility] has been scheduled for pick up for first available. The number to call for report is [phone].  RN aware of discharge plan.  12pm: CSW spoke with patient at bedside to inform him of discharge plan - he is in agreement. CSW informed patient that [facility] staff member would be coming to visit him to complete paperwork.  10:30am: Patient has been offered a bed at [facility].  RN is swabbing patient to submit for a COVID test.  CSW spoke with Donovan Kail of [facility] to inform him of discharge plan.  8:30am: Patient's clinical information is currently under review by Lowella Bandy at [facility] for potential admission to the facility as a DTP patient.  Edwin Dada, MSW, LCSW Transitions of Care  Clinical Social Worker II [phone]

## 2020-12-18 NOTE — Discharge Summary (Signed)
[facility]  Patient name: Robert Frank Medical record number: [id number] Date of birth: June 05, 1966 Age: 55 y.o. Gender: male Date of Admission: 11/22/2020  Date of Discharge: 12/18/20 Admitting Physician: Leighton Roachodd D McDiarmid, MD  Primary Care Provider: Pcp, No Consultants: None  Indication for Hospitalization: Cellulitis  Discharge Diagnoses/Problem List:  HTN, COPD, alcohol use, left eye blindness.   Disposition: Able to be discharged safely to SNF  Discharge Condition: Stable  Discharge Exam:   Gen:  NAD, Alert Lungs:  CTAB Heart:  RRR, Normal heart sounds Extremities:  Mild non-pitting edema, right foot wrapped, mildly tender to palpation  Brief Hospital Course:  Right lower extremity cellulitis, meets sepsis criteria Patient presented with right lower extremity cellulitis in the setting of homelessness and chronic lower extremity wounds.  Patient was brought to the ED after being found on his porch.  On admission patient was tachycardic with leukocytosis, CXR without active disease.  Right lower extremity with erythema, drainage, foul odor, covered in maggots.  Blood cultures obtained and patient started on vancomycin and cefepime.  Wound care nurse was consulted and completed conservative sharp wound debridement and collected wound culture.  CT right ankle and foot with contrast without evidence of osteomyelitis and no drainable fluid collection.  Completed 10 day course of antibiotics.  Soreness of foot improved with no sign of infection.  Rhabdomyolysis-resolved Patient found down for several hours upon presentation.  CK was elevated in the 800s on admission and patient was given fluid resuscitation with subsequent improvement.  Resolved by time of discharge.   Hyponatremia Current NA 126, in the setting of alcohol use and likely dehydration/malnutrition. Patient has been hospitalized with hyponatremia multiple times.  Improved  to 133 by time of discharge.  Homeless  difficult social situation Patient was found by EMS living in abandoned house on the porch covered in feces and urine. Patient reports living with his brother and without running water or electricity in the home.    History of alcohol use  Hepatomegaly Patient reports drinking a 4 pack of "tall beers" daily when he is able to afford it.  States that his last drink was 1 to 2 days prior to presenting to the ED.  AST/ALT 46/23.  Physical exam with hepatomegaly without signs of abdominal distention. No symptoms of withdrawal while present.    Issues for Follow Up:  Insurance authorization. Patient had dysphoric mood for most of hospitalization.  Consider Behavioral Health Therapy if patient amennable. Continued wound care for lower right foot.  Significant Procedures: TTE  ECHOCARDIOGRAM REPORT         Patient Name:   Robert Frank Date of Exam: 12/17/2019  Medical Rec #:  [id number]         Height:       67.0 in  Accession #:    [id number]        Weight:       223.8 lb  Date of Birth:  June 05, 1966         BSA:          2.121 m  Patient Age:    54 years          BP:           135/76 mmHg  Patient Gender: M                 HR:           93 bpm.  Exam Location:  Inpatient   Procedure: 2D Echo, Cardiac Doppler and Color Doppler   Indications:    CHF (congestive heart failure)     History:        Patient has no prior history of Echocardiogram  examinations.                  COPD and Stroke; Risk Factors:Hypertension. Alcoholism.     Sonographer:    Celesta Gentile RCS  Referring Phys: [id number] CURTIS J WOODS   IMPRESSIONS     1. Left ventricular ejection fraction, by estimation, is 60 to 65%. The  left ventricle has normal function. The left ventricle has no regional  wall motion abnormalities. There is mild left ventricular hypertrophy.  Left ventricular diastolic parameters  were normal.   2. Right ventricular systolic function is  normal. The right ventricular  size is normal.   3. The mitral valve is normal in structure. No evidence of mitral valve  regurgitation. No evidence of mitral stenosis.   4. The aortic valve was not well visualized. Aortic valve regurgitation  is not visualized. Mild aortic valve sclerosis is present, with no  evidence of aortic valve stenosis.   5. The inferior vena cava is normal in size with greater than 50%  respiratory variability, suggesting right atrial pressure of 3 mmHg.   FINDINGS   Left Ventricle: Left ventricular ejection fraction, by estimation, is 60  to 65%. The left ventricle has normal function. The left ventricle has no  regional wall motion abnormalities. The left ventricular internal cavity  size was normal in size. There is   mild left ventricular hypertrophy. Left ventricular diastolic parameters  were normal.   Right Ventricle: The right ventricular size is normal. No increase in  right ventricular wall thickness. Right ventricular systolic function is  normal.   Left Atrium: Left atrial size was normal in size.   Right Atrium: Right atrial size was normal in size.   Pericardium: There is no evidence of pericardial effusion.   Mitral Valve: The mitral valve is normal in structure. Normal mobility of  the mitral valve leaflets. No evidence of mitral valve regurgitation. No  evidence of mitral valve stenosis.   Tricuspid Valve: The tricuspid valve is normal in structure. Tricuspid  valve regurgitation is mild . No evidence of tricuspid stenosis.   Aortic Valve: The aortic valve was not well visualized. Aortic valve  regurgitation is not visualized. Mild aortic valve sclerosis is present,  with no evidence of aortic valve stenosis.   Pulmonic Valve: The pulmonic valve was normal in structure. Pulmonic valve  regurgitation is not visualized. No evidence of pulmonic stenosis.   Aorta: The aortic root is normal in size and structure.   Venous: The inferior  vena cava is normal in size with greater than 50%  respiratory variability, suggesting right atrial pressure of 3 mmHg.   IAS/Shunts: No atrial level shunt detected by color flow Doppler.      LEFT VENTRICLE  PLAX 2D  LVIDd:         4.60 cm  Diastology  LVIDs:         3.20 cm  LV e' lateral:   9.90 cm/s  LV PW:         1.20 cm  LV E/e' lateral: 11.2  LV IVS:        1.40 cm  LV e' medial:    9.25 cm/s  LVOT diam:     2.10 cm  LV E/e' medial:  12.0  LV SV:         81  LV SV Index:   38  LVOT Area:     3.46 cm      RIGHT VENTRICLE  RV S prime:     14.90 cm/s  TAPSE (M-mode): 2.3 cm   LEFT ATRIUM             Index       RIGHT ATRIUM           Index  LA diam:        3.50 cm 1.65 cm/m  RA Area:     18.40 cm  LA Vol (A2C):   64.8 ml 30.55 ml/m RA Volume:   52.40 ml  24.70 ml/m  LA Vol (A4C):   77.8 ml 36.68 ml/m  LA Biplane Vol: 72.8 ml 34.32 ml/m   AORTIC VALVE  LVOT Vmax:   115.00 cm/s  LVOT Vmean:  78.900 cm/s  LVOT VTI:    0.235 m     AORTA  Ao Root diam: 3.10 cm   MITRAL VALVE  MV Area (PHT): 4.31 cm     SHUNTS  MV Decel Time: 176 msec     Systemic VTI:  0.24 m  MV E velocity: 111.00 cm/s  Systemic Diam: 2.10 cm  MV A velocity: 85.30 cm/s  MV E/A ratio:  1.30   Charlton Haws MD  Electronically signed by Charlton Haws MD  Signature Date/Time: 12/17/2019/9:35:15 PM         Final    Significant Labs and Imaging:  Recent Labs  Lab 12/17/20 0951  WBC 7.3  HGB 13.5  HCT 41.0  PLT 277   Recent Labs  Lab 12/17/20 0951  NA 133*  K 3.7  CL 102  CO2 27  GLUCOSE 109*  BUN 23*  CREATININE 0.79  CALCIUM 8.9    11/26/20- B12-226 11/26/20- Folate-10.5 11/25/20- CK- 221  EXAM: PORTABLE CHEST 1 VIEW   COMPARISON:  Radiograph 11/22/2020   FINDINGS: Stable heart size and mediastinal contours. Aortic atherosclerosis. There is no focal airspace disease, pleural effusion, pulmonary edema or pneumothorax. Remote left rib fractures.   IMPRESSION: No acute  chest finding.  No evidence of pneumonia.     Electronically Signed   By: Narda Rutherford M.D.   On: 12/12/2020 16:11  CLINICAL DATA:  Right foot and ankle swelling. Cellulitis. Clinical concern for osteomyelitis.   EXAM: CT OF THE RIGHT ANKLE WITH CONTRAST   CT OF THE RIGHT FOOT WITH CONTRAST   TECHNIQUE: Multidetector CT imaging of the right ankle was performed following the standard protocol during bolus administration of intravenous contrast.   CONTRAST:  75mL OMNIPAQUE IOHEXOL 300 MG/ML  SOLN   COMPARISON:  X-ray 11/23/2020.   FINDINGS: RIGHT ANKLE:   Bones/Joint/Cartilage   No acute fracture. No dislocation. No cortical destruction or periostitis is seen. Ankle mortise is congruent with preservation of the tibiotalar joint space. No tibiotalar joint effusion. Os trigonum noted.   Ligaments   Suboptimally assessed by CT.   Muscles and Tendons   Fatty infiltration of the lower leg musculature. Tendinous structures appear grossly intact. No tenosynovial fluid collections are evident.   Soft tissues   Mild circumferential subcutaneous edema, nonspecific. No organized or drainable fluid collections. No soft tissue gas.   RIGHT FOOT:   Bones/Joint/Cartilage   No acute fracture. Severe hallux valgus deformity with lateral subluxation of the great toe proximal phalanx. Hallux sesamoids are laterally subluxed. No cortical destruction  or periostitis is seen.   Ligaments   Suboptimally assessed by CT.   Muscles and Tendons   Fatty infiltration of the foot musculature. Tendinous structures appear grossly intact. No tenosynovial fluid collections are evident.   Soft tissues   Mild circumferential subcutaneous edema, nonspecific. No organized or drainable fluid collections. No soft tissue gas.   IMPRESSION: 1. No CT evidence of osteomyelitis of the right foot or ankle. 2. Mild circumferential subcutaneous edema of the right lower extremity,  nonspecific. No organized or drainable fluid collections. No soft tissue gas. 3. Severe hallux valgus deformity.     Electronically Signed   By: Duanne Guess D.O.   On: 11/24/2020 19:08   CLINICAL DATA:  Hepatomegaly   EXAM: ULTRASOUND ABDOMEN LIMITED RIGHT UPPER QUADRANT   COMPARISON:  Renal ultrasound 12/22/2019, abdominal ultrasound 12/04/2019   FINDINGS: Gallbladder:   Borderline thickening of the gallbladder wall at 3.4 mm. Layering biliary sludge. Few echogenic, shadowing gallstones are noted as well, largest measuring up to 11 mm in diameter. Sonographic Eulah Pont sign is reportedly negative however.   Common bile duct:   Diameter: 2.7 mm, nondilated   Liver:   No visible focal liver lesion though portions of the liver are poorly evaluated due to difficulties with patient positioning. No intrahepatic biliary ductal dilatation. Smooth liver surface contour. Liver measures 17 cm in length, upper limits normal. Portal vein is patent on color Doppler imaging with normal direction of blood flow towards the liver.   Other: Technically challenging exam due to inability to decubitus the patient secondary to open sores on the lower extremities.   IMPRESSION: 1. Cholelithiasis and biliary sludge with borderline gallbladder wall thickening. Sonographic Eulah Pont sign is reportedly negative. Findings are equivocal for an acute cholecystitis in should be considered in the complete clinical context. If there is persisting concern, HIDA could be obtained. 2. Hepatic size is upper limits of normal.     Electronically Signed   By: Kreg Shropshire M.D.   On: 11/23/2020 03:17  Results/Tests Pending at Time of Discharge: None  Discharge Medications:  Allergies as of 12/18/2020   No Known Allergies      Medication List     STOP taking these medications    multivitamin with minerals Tabs tablet   thiamine 100 MG tablet       TAKE these medications    (feeding  supplement) PROSource Plus liquid Take 30 mLs by mouth 3 (three) times daily between meals.   nutrition supplement (JUVEN) Pack Take 1 packet by mouth 2 (two) times daily between meals.   feeding supplement Liqd Take 237 mLs by mouth 3 (three) times daily between meals.   acetaminophen 325 MG tablet Commonly known as: TYLENOL Take 2 tablets (650 mg total) by mouth every 6 (six) hours as needed for mild pain or moderate pain.   atorvastatin 20 MG tablet Commonly known as: LIPITOR Take 1 tablet (20 mg total) by mouth daily. Start taking on: December 19, 2020   bacitracin ointment Apply topically 2 (two) times daily.   cyanocobalamin 1000 MCG tablet Take 1 tablet (1,000 mcg total) by mouth daily. Start taking on: December 19, 2020   folic acid 1 MG tablet Commonly known as: FOLVITE Take 1 tablet (1 mg total) by mouth daily. Start taking on: December 19, 2020   ipratropium-albuterol 0.5-2.5 (3) MG/3ML Soln Commonly known as: DUONEB Take 3 mLs by nebulization every 6 (six) hours as needed.   metoprolol tartrate 25 MG tablet Commonly known  as: LOPRESSOR Take 1 tablet (25 mg total) by mouth 2 (two) times daily. What changed:  medication strength how much to take   nicotine 14 mg/24hr patch Commonly known as: NICODERM CQ - dosed in mg/24 hours Place 1 patch (14 mg total) onto the skin daily. Start taking on: December 19, 2020   polyethylene glycol 17 g packet Commonly known as: MIRALAX / GLYCOLAX Take 17 g by mouth daily. Start taking on: December 19, 2020   senna 8.6 MG Tabs tablet Commonly known as: SENOKOT Take 1 tablet (8.6 mg total) by mouth daily. Start taking on: December 19, 2020   umeclidinium bromide 62.5 MCG/INH Aepb Commonly known as: INCRUSE ELLIPTA Inhale 1 puff into the lungs daily. Start taking on: December 19, 2020               Discharge Care Instructions  (From admission, onward)           Start     Ordered   12/18/20 0000  Discharge wound care:        Comments: Apply bacitracin to right foot wounds, cover with vaseline or Xeroform gauze, wrap in kerlex daily   12/18/20 1248            Discharge Instructions: Please refer to Patient Instructions section of EMR for full details.  Patient was counseled important signs and symptoms that should prompt return to medical care, changes in medications, dietary instructions, activity restrictions, and follow up appointments.   Follow-Up Appointments:   Jovita Kussmaul, MD 12/18/2020, 12:48 PM PGY-1, [facility]

## 2020-12-18 NOTE — Progress Notes (Signed)
Report called to RN at [facility], d/c instructions discussed with pt, no pain or distress noted, vs wnl at the time of d/c.

## 2022-03-05 IMAGING — CR DG TIBIA/FIBULA 2V*L*
4 series · 4 of 4 positions shown · non-contrast
Comparison: None.

CLINICAL DATA: Left leg and foot pain for 1 day

EXAM:
LEFT TIBIA AND FIBULA - 2 VIEW

[tibia ap (1 of 2)]
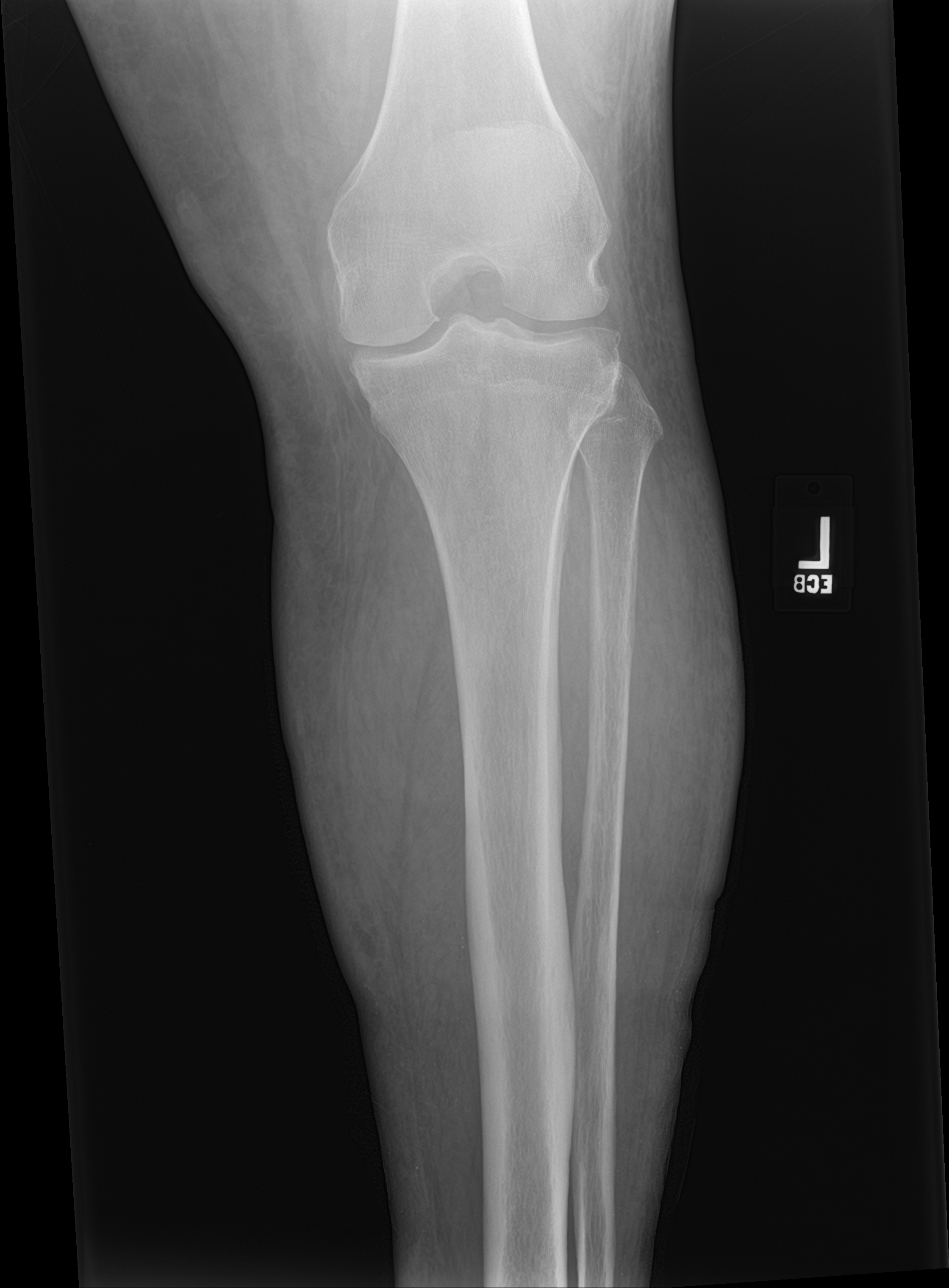

[tibia ap (2 of 2)]
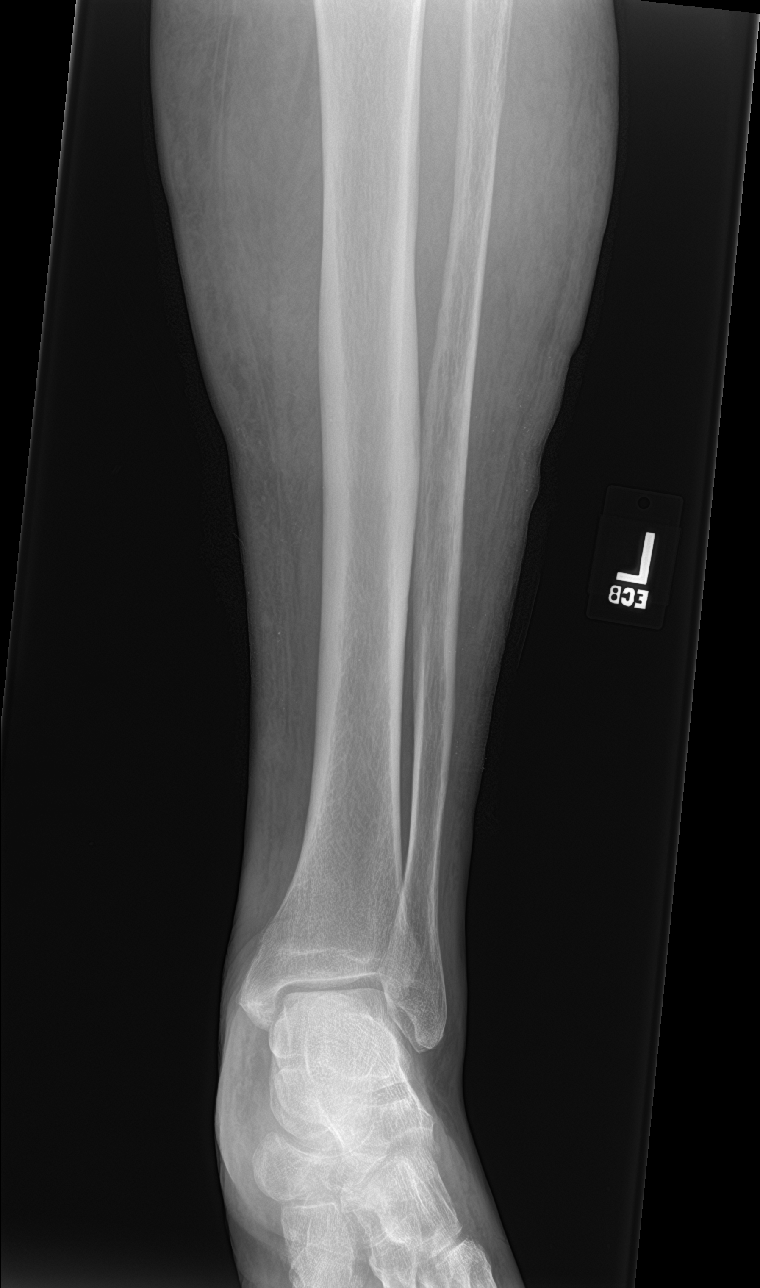

[tibia lat (1 of 2)]
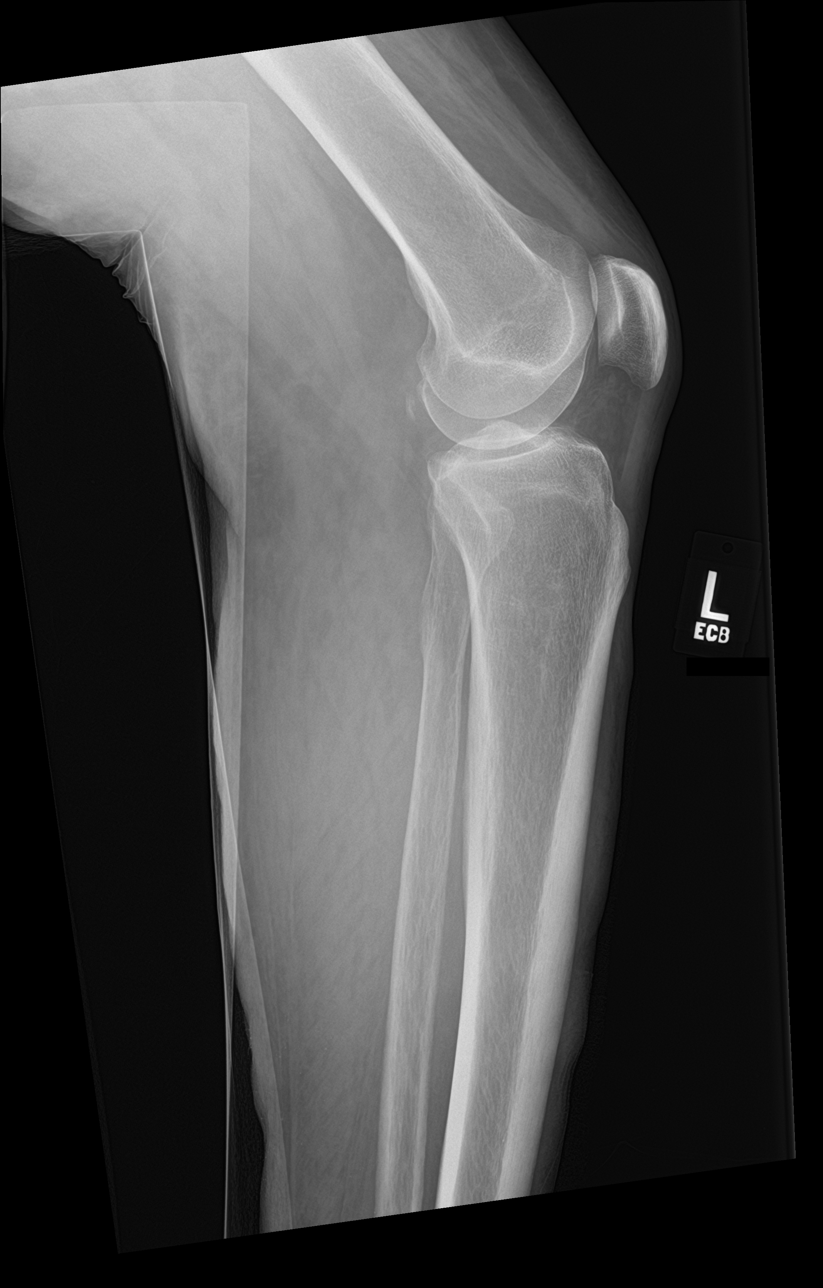

[tibia lat (2 of 2)]
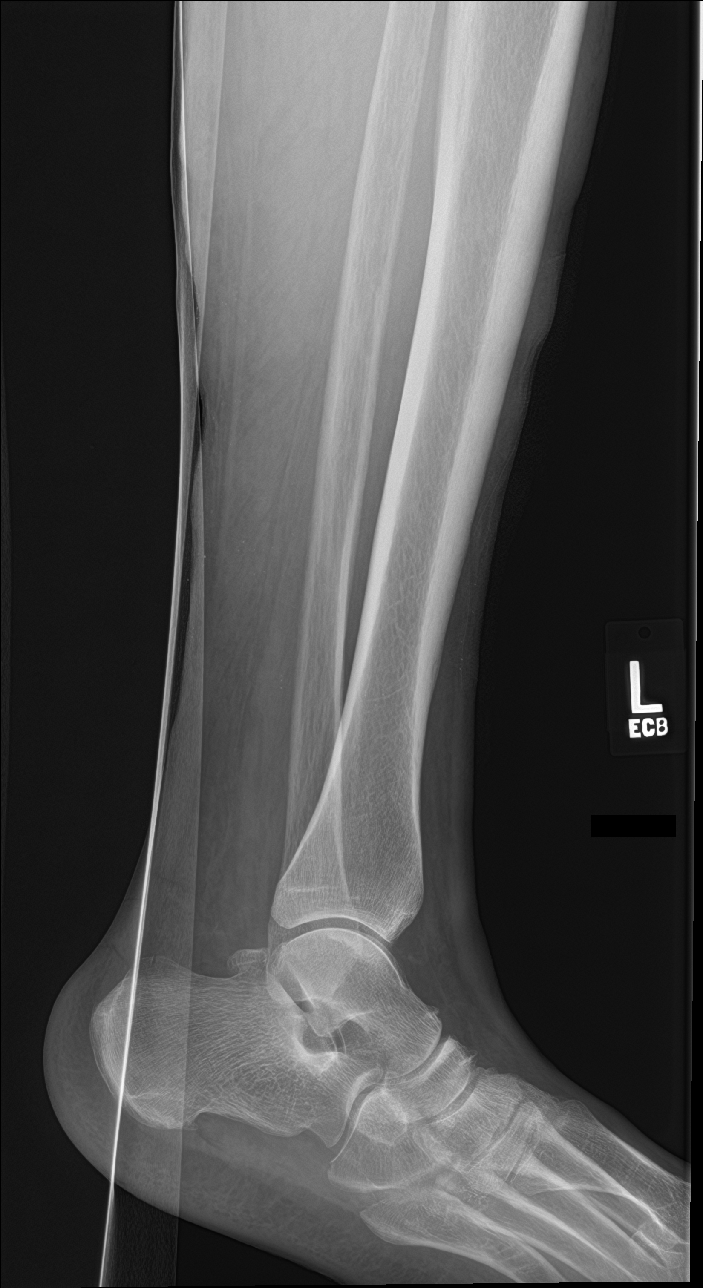

[4 of 4 positions shown; findings below may reference images not displayed]

FINDINGS: Diffuse edematous changes of the lower extremity. No soft tissue gas
or foreign body. Scattered punctate calcifications in the
superficial soft tissues may reflect sequela of chronic venous
stasis. The tibia and fibula are intact. No worrisome osseous
lesions. No acute bony abnormality. Specifically, no fracture,
subluxation, or dislocation. Mild degenerative changes noted at the
knee and ankle
IMPRESSION: 1. Diffuse edematous changes of the lower extremity without soft
tissue gas or foreign body. No acute osseous abnormality.
2. Mild degenerative changes at the knee and ankle.

## 2022-11-20 IMAGING — CR DG CHEST 2V
2 series · 2 of 2 positions shown · non-contrast
Comparison: Chest x-ray dated December 16, 2019.

CLINICAL DATA: Sudden onset chest pain.

EXAM:
CHEST - 2 VIEW

[chest lat]
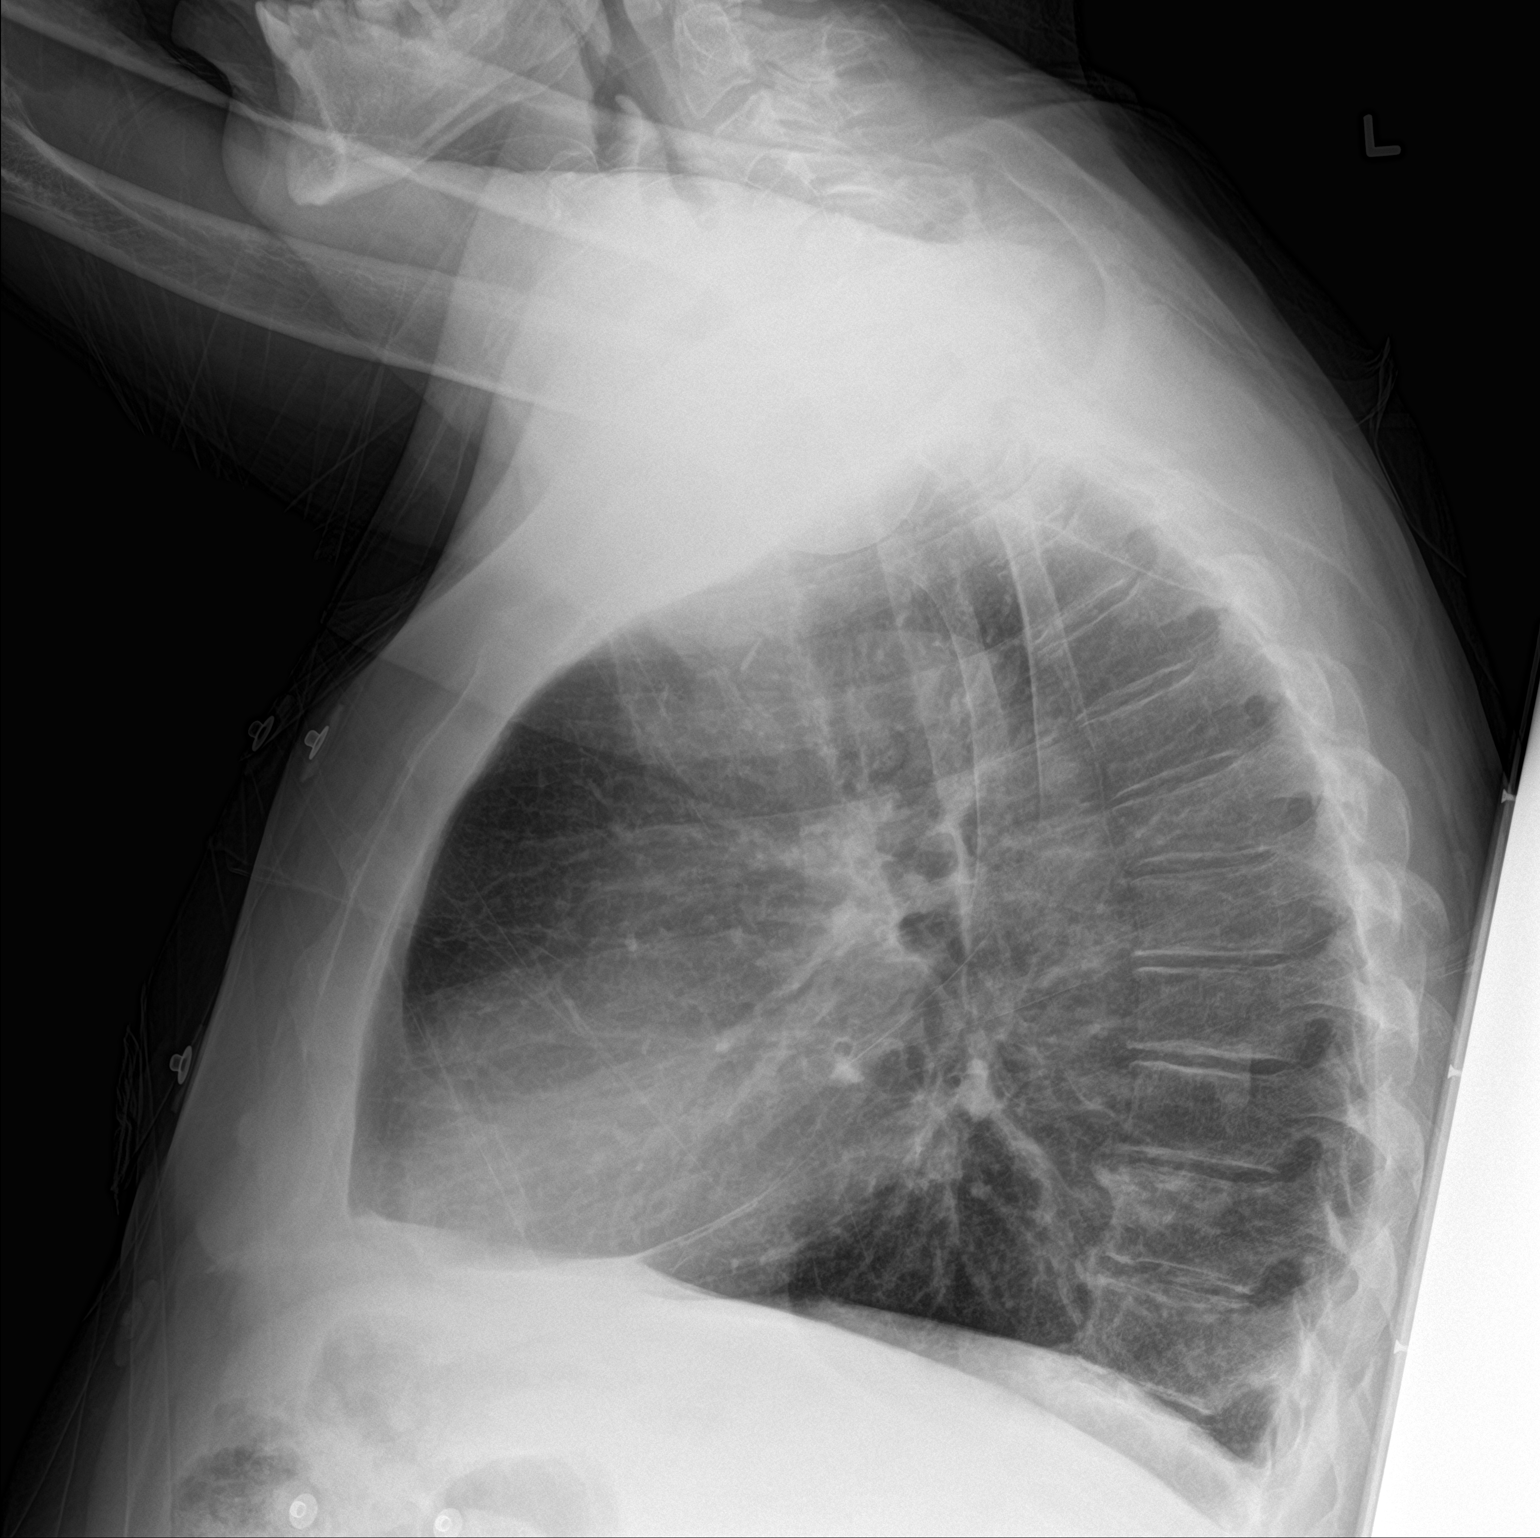

[chest ap]
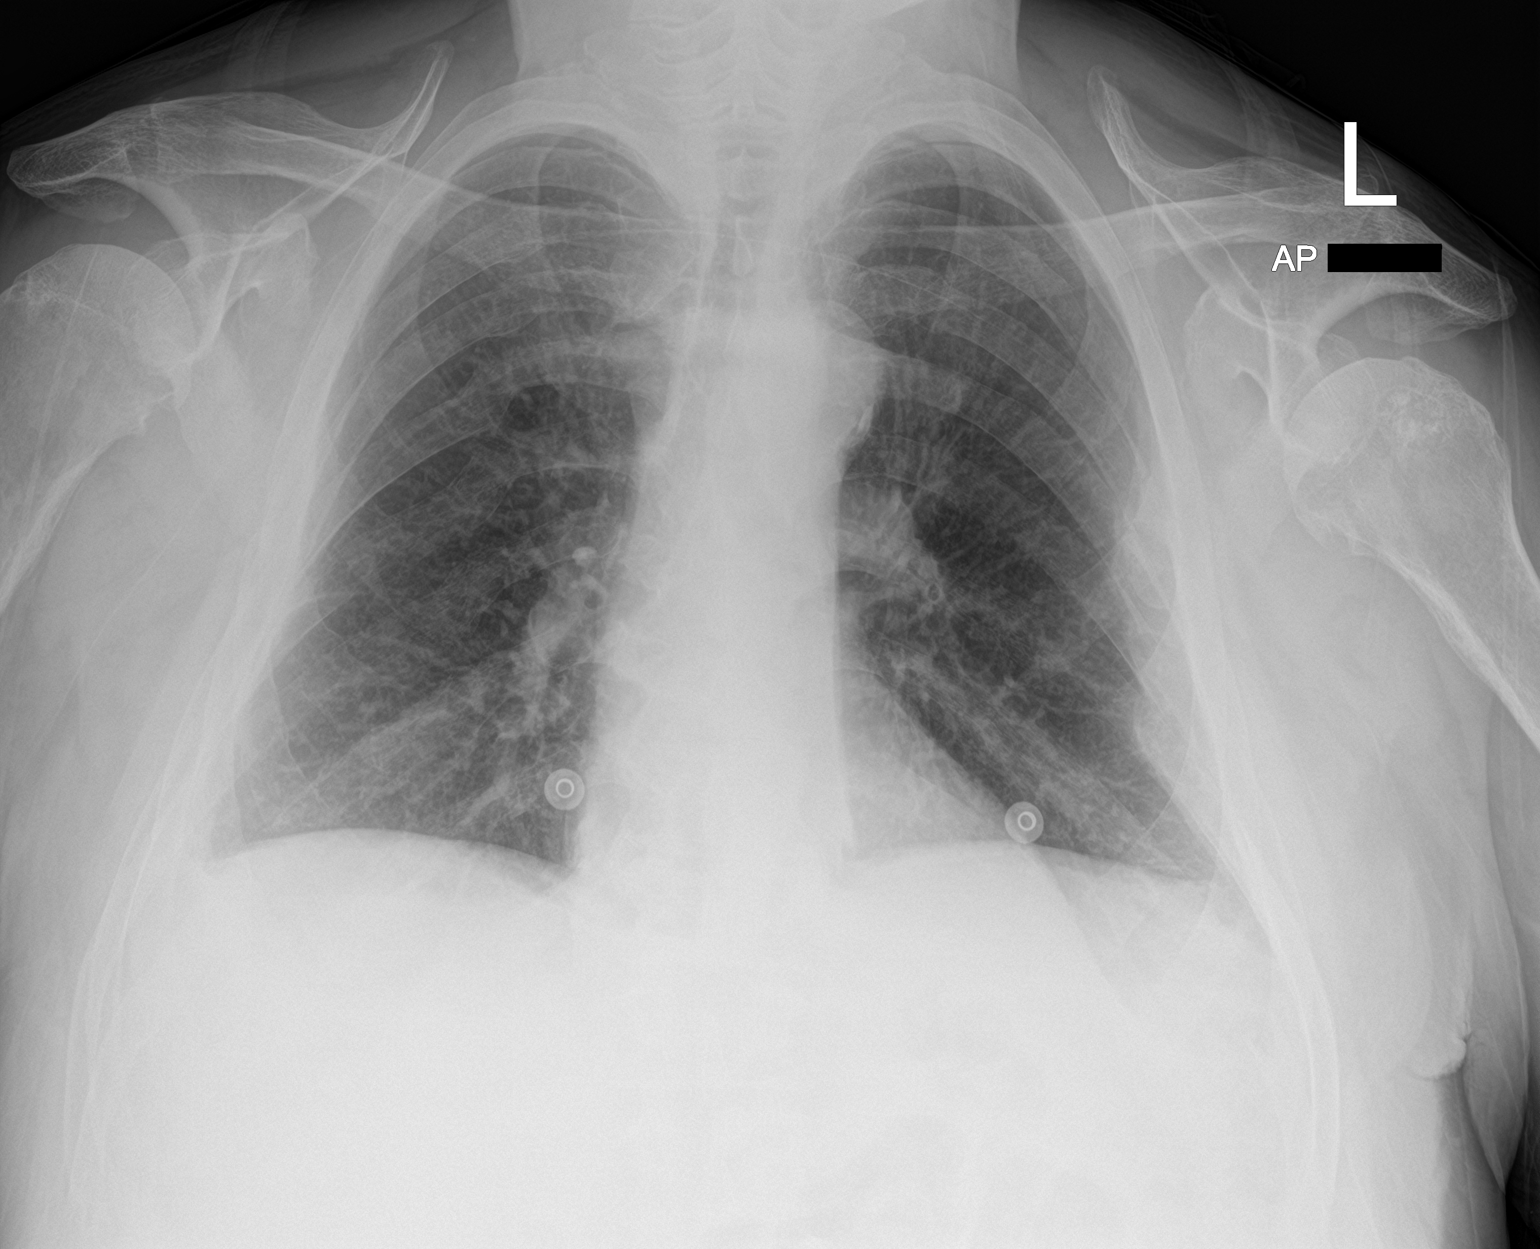

[2 of 2 positions shown; findings below may reference images not displayed]

FINDINGS: The heart size and mediastinal contours are within normal limits.
Normal pulmonary vascularity. No focal consolidation, pleural
effusion, or pneumothorax. No acute osseous abnormality. Old
bilateral rib fractures again noted.
IMPRESSION: 1. No acute cardiopulmonary disease.
